# Patient Record
Sex: Male | Born: 1940 | Race: White | Hispanic: No | Smoking: Never smoker
Health system: Southern US, Community
[De-identification: ages and names within clinical notes are randomized; demographics above are authoritative.]

## PROBLEM LIST (undated history)

## (undated) ENCOUNTER — Encounter

## (undated) ENCOUNTER — Encounter: Attending: Hematology | Primary: Hematology

## (undated) ENCOUNTER — Encounter: Attending: Family | Primary: Family

## (undated) ENCOUNTER — Ambulatory Visit

## (undated) ENCOUNTER — Ambulatory Visit: Payer: MEDICARE | Attending: Physical Medicine & Rehabilitation | Primary: Physical Medicine & Rehabilitation

## (undated) ENCOUNTER — Telehealth

## (undated) ENCOUNTER — Ambulatory Visit: Payer: MEDICARE

## (undated) ENCOUNTER — Telehealth: Attending: Hematology | Primary: Hematology

## (undated) ENCOUNTER — Ambulatory Visit: Payer: MEDICARE | Attending: Hematology | Primary: Hematology

## (undated) ENCOUNTER — Encounter: Attending: Pharmacist | Primary: Pharmacist

## (undated) ENCOUNTER — Ambulatory Visit: Attending: Hematology & Oncology | Primary: Hematology & Oncology

## (undated) ENCOUNTER — Telehealth: Attending: Family | Primary: Family

## (undated) DIAGNOSIS — I10 Essential (primary) hypertension: Secondary | ICD-10-CM

## (undated) DIAGNOSIS — I639 Cerebral infarction, unspecified: Secondary | ICD-10-CM

## (undated) DIAGNOSIS — D72829 Elevated white blood cell count, unspecified: Secondary | ICD-10-CM

## (undated) DIAGNOSIS — M659 Unspecified synovitis and tenosynovitis, unspecified site: Secondary | ICD-10-CM

## (undated) DIAGNOSIS — G8929 Other chronic pain: Secondary | ICD-10-CM

## (undated) DIAGNOSIS — I509 Heart failure, unspecified: Secondary | ICD-10-CM

## (undated) DIAGNOSIS — S43439A Superior glenoid labrum lesion of unspecified shoulder, initial encounter: Secondary | ICD-10-CM

## (undated) DIAGNOSIS — G4733 Obstructive sleep apnea (adult) (pediatric): Secondary | ICD-10-CM

## (undated) DIAGNOSIS — R42 Dizziness and giddiness: Secondary | ICD-10-CM

## (undated) DIAGNOSIS — C8510 Unspecified B-cell lymphoma, unspecified site: Secondary | ICD-10-CM

## (undated) DIAGNOSIS — K219 Gastro-esophageal reflux disease without esophagitis: Secondary | ICD-10-CM

## (undated) DIAGNOSIS — H3530 Unspecified macular degeneration: Secondary | ICD-10-CM

## (undated) DIAGNOSIS — M549 Dorsalgia, unspecified: Secondary | ICD-10-CM

## (undated) DIAGNOSIS — C760 Malignant neoplasm of head, face and neck: Secondary | ICD-10-CM

## (undated) DIAGNOSIS — D126 Benign neoplasm of colon, unspecified: Secondary | ICD-10-CM

## (undated) DIAGNOSIS — I2510 Atherosclerotic heart disease of native coronary artery without angina pectoris: Secondary | ICD-10-CM

## (undated) DIAGNOSIS — E559 Vitamin D deficiency, unspecified: Secondary | ICD-10-CM

## (undated) DIAGNOSIS — Z951 Presence of aortocoronary bypass graft: Secondary | ICD-10-CM

## (undated) DIAGNOSIS — I499 Cardiac arrhythmia, unspecified: Secondary | ICD-10-CM

## (undated) DIAGNOSIS — I25810 Atherosclerosis of coronary artery bypass graft(s) without angina pectoris: Secondary | ICD-10-CM

## (undated) DIAGNOSIS — C61 Malignant neoplasm of prostate: Secondary | ICD-10-CM

## (undated) DIAGNOSIS — E785 Hyperlipidemia, unspecified: Secondary | ICD-10-CM

## (undated) HISTORY — DX: Atherosclerosis of coronary artery bypass graft(s) without angina pectoris: I25.810

## (undated) HISTORY — PX: TONSILLECTOMY AND ADENOIDECTOMY: SUR1326

## (undated) HISTORY — DX: Cerebral infarction, unspecified: I63.9

## (undated) HISTORY — DX: Unspecified macular degeneration: H35.30

## (undated) HISTORY — DX: Superior glenoid labrum lesion of unspecified shoulder, initial encounter: S43.439A

## (undated) HISTORY — DX: Dorsalgia, unspecified: M54.9

## (undated) HISTORY — DX: Dizziness and giddiness: R42

## (undated) HISTORY — DX: Presence of aortocoronary bypass graft: Z95.1

## (undated) HISTORY — PX: UPPER GASTROINTESTINAL ENDOSCOPY: SHX188

## (undated) HISTORY — PX: BIOPSY THYROID: PRO38

## (undated) HISTORY — DX: Gastro-esophageal reflux disease without esophagitis: K21.9

## (undated) HISTORY — DX: Essential (primary) hypertension: I10

## (undated) HISTORY — PX: TOTAL KNEE ARTHROPLASTY: SHX125

## (undated) HISTORY — PX: JOINT REPLACEMENT: SHX530

## (undated) HISTORY — DX: Hyperlipidemia, unspecified: E78.5

## (undated) HISTORY — DX: Benign neoplasm of colon, unspecified: D12.6

## (undated) HISTORY — PX: COLONOSCOPY: SHX174

## (undated) HISTORY — DX: Vitamin D deficiency, unspecified: E55.9

## (undated) HISTORY — DX: Unspecified B-cell lymphoma, unspecified site: C85.10

## (undated) HISTORY — DX: Obstructive sleep apnea (adult) (pediatric): G47.33

## (undated) HISTORY — DX: Atherosclerotic heart disease of native coronary artery without angina pectoris: I25.10

## (undated) HISTORY — PX: EYE SURGERY: SHX253

## (undated) HISTORY — PX: CATARACT EXTRACTION, BILATERAL: SHX1313

## (undated) HISTORY — DX: Other chronic pain: G89.29

## (undated) HISTORY — DX: Unspecified synovitis and tenosynovitis, unspecified site: M65.90

## (undated) HISTORY — DX: Synovitis and tenosynovitis, unspecified: M65.9

## (undated) HISTORY — PX: CARPAL TUNNEL RELEASE: SHX101

## (undated) HISTORY — DX: Elevated white blood cell count, unspecified: D72.829

---

## 1970-06-23 HISTORY — PX: SOFT TISSUE TUMOR RESECTION: SHX1054

## 2000-06-23 HISTORY — PX: HEMORRHOID SURGERY: SHX153

## 2000-12-17 ENCOUNTER — Encounter: Payer: Self-pay | Admitting: General Surgery

## 2000-12-18 ENCOUNTER — Inpatient Hospital Stay (HOSPITAL_COMMUNITY): Admission: RE | Admit: 2000-12-18 | Discharge: 2000-12-19 | Payer: Self-pay | Admitting: General Surgery

## 2003-06-24 DIAGNOSIS — Z951 Presence of aortocoronary bypass graft: Secondary | ICD-10-CM

## 2003-06-24 HISTORY — DX: Presence of aortocoronary bypass graft: Z95.1

## 2003-12-18 ENCOUNTER — Other Ambulatory Visit: Payer: Self-pay

## 2004-02-01 ENCOUNTER — Encounter: Payer: Self-pay | Admitting: Cardiovascular Disease

## 2004-02-01 ENCOUNTER — Inpatient Hospital Stay (HOSPITAL_COMMUNITY): Admission: EM | Admit: 2004-02-01 | Discharge: 2004-02-07 | Payer: Self-pay | Admitting: Podiatry

## 2004-02-01 HISTORY — PX: CARDIAC CATHETERIZATION: SHX172

## 2004-02-02 HISTORY — PX: CORONARY ARTERY BYPASS GRAFT: SHX141

## 2004-02-19 ENCOUNTER — Encounter
Admission: RE | Admit: 2004-02-19 | Discharge: 2004-02-19 | Payer: Self-pay | Admitting: Thoracic Surgery (Cardiothoracic Vascular Surgery)

## 2004-03-06 ENCOUNTER — Inpatient Hospital Stay (HOSPITAL_COMMUNITY): Admission: EM | Admit: 2004-03-06 | Discharge: 2004-03-08 | Payer: Self-pay | Admitting: Emergency Medicine

## 2006-05-24 ENCOUNTER — Encounter: Admission: RE | Admit: 2006-05-24 | Discharge: 2006-05-24 | Payer: Self-pay | Admitting: Internal Medicine

## 2008-08-31 ENCOUNTER — Encounter: Payer: Self-pay | Admitting: Cardiovascular Disease

## 2008-11-08 ENCOUNTER — Ambulatory Visit: Payer: Self-pay | Admitting: Internal Medicine

## 2008-12-11 ENCOUNTER — Encounter: Payer: Self-pay | Admitting: Cardiovascular Disease

## 2009-06-23 DIAGNOSIS — I639 Cerebral infarction, unspecified: Secondary | ICD-10-CM

## 2009-06-23 HISTORY — DX: Cerebral infarction, unspecified: I63.9

## 2009-10-04 DIAGNOSIS — K219 Gastro-esophageal reflux disease without esophagitis: Secondary | ICD-10-CM | POA: Insufficient documentation

## 2009-10-04 DIAGNOSIS — Z951 Presence of aortocoronary bypass graft: Secondary | ICD-10-CM | POA: Insufficient documentation

## 2009-10-04 DIAGNOSIS — I10 Essential (primary) hypertension: Secondary | ICD-10-CM | POA: Insufficient documentation

## 2009-10-04 DIAGNOSIS — Z9889 Other specified postprocedural states: Secondary | ICD-10-CM | POA: Insufficient documentation

## 2009-10-04 DIAGNOSIS — R972 Elevated prostate specific antigen [PSA]: Secondary | ICD-10-CM | POA: Insufficient documentation

## 2009-10-04 DIAGNOSIS — Z85830 Personal history of malignant neoplasm of bone: Secondary | ICD-10-CM | POA: Insufficient documentation

## 2009-10-04 DIAGNOSIS — E785 Hyperlipidemia, unspecified: Secondary | ICD-10-CM | POA: Insufficient documentation

## 2009-10-04 DIAGNOSIS — N12 Tubulo-interstitial nephritis, not specified as acute or chronic: Secondary | ICD-10-CM | POA: Insufficient documentation

## 2009-10-15 ENCOUNTER — Telehealth: Payer: Self-pay | Admitting: Cardiovascular Disease

## 2009-10-16 ENCOUNTER — Ambulatory Visit: Payer: Self-pay | Admitting: Cardiovascular Disease

## 2009-10-16 ENCOUNTER — Inpatient Hospital Stay (HOSPITAL_COMMUNITY): Admission: EM | Admit: 2009-10-16 | Discharge: 2009-10-18 | Payer: Self-pay | Admitting: Emergency Medicine

## 2009-10-17 ENCOUNTER — Encounter (INDEPENDENT_AMBULATORY_CARE_PROVIDER_SITE_OTHER): Payer: Self-pay | Admitting: Emergency Medicine

## 2009-10-17 ENCOUNTER — Ambulatory Visit: Payer: Self-pay | Admitting: Vascular Surgery

## 2009-12-13 ENCOUNTER — Encounter: Payer: Self-pay | Admitting: Cardiovascular Disease

## 2009-12-19 ENCOUNTER — Encounter: Payer: Self-pay | Admitting: Cardiovascular Disease

## 2010-02-28 ENCOUNTER — Encounter: Payer: Self-pay | Admitting: Cardiovascular Disease

## 2010-03-21 ENCOUNTER — Ambulatory Visit: Payer: Self-pay | Admitting: Podiatry

## 2010-03-27 ENCOUNTER — Ambulatory Visit: Payer: Self-pay | Admitting: Cardiovascular Disease

## 2010-03-27 DIAGNOSIS — G459 Transient cerebral ischemic attack, unspecified: Secondary | ICD-10-CM | POA: Insufficient documentation

## 2010-05-10 ENCOUNTER — Ambulatory Visit: Payer: Self-pay | Admitting: Ophthalmology

## 2010-05-20 ENCOUNTER — Ambulatory Visit: Payer: Self-pay | Admitting: Ophthalmology

## 2010-06-10 ENCOUNTER — Ambulatory Visit: Payer: Self-pay | Admitting: Ophthalmology

## 2010-06-20 ENCOUNTER — Encounter: Payer: Self-pay | Admitting: Cardiovascular Disease

## 2010-07-23 NOTE — Assessment & Plan Note (Signed)
Summary: 6 month   Visit Type:  Initial Consult Primary Provider:  Irving [name].  CC:  Had a spell back in April with slurred speech and blurred vision and went to [facility] for evaluation. He had no episodes since then..  History of Present Illness: Wesley [name] is a very pleasant 70 year old [name] who is a very active lawyer in town, history of coronary artery disease, bypass x4 in August 2005, history of fall and fracture of his left shoulder and with chronic pain, hyperlipidemia who presents to establish care.  overall, he is doing well. He did have an episode in April of this year where he developed 8 minutes of a fascia, vision problems and felt he was in a daze. This returned to normal after 8-10 minutes. He was evaluated in the emergency room at [facility] and had echocardiogram, MRI, carotid ultrasound. 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 Since then he has had no further episodes and has felt well. He does not exercise on a regular basis. He has been tolerating Crestor though he does have some muscle aches at times.  Echocardiogram from March 2010 [name] ejection fraction 50-55%, diastolic dysfunction, right ventricular systolic pressure 30-40 mm of mercury, borderline dilated left atrium.  Stress test in March 2010 [name] ejection fraction 49%, no EKG changes, exercised for 7 minutes, mild inferior ischemia versus diaphragmatic attenuation.  Cardiac catheterization in August 2005 showing 60% left main disease at the proximal region, 95% ostial LAD disease, diffuse 50-60% disease through the mid LAD and 90% mid LAD disease, 30% diagonal disease, left circumflex with 40-50% mid disease, 50% OM disease at the proximal region of OM 3, 50% distal RCA disease #5% ostial PLB disease with sequential lesion of 40-50%.  Cholesterol in June 2010 [name] total crest for 129, LDL 67, HDL 43  EKG [name] normal  sinus rhythm with rate 70 beats per minute, no significant ST or T wave changes  Preventive Screening-Counseling & Management  Alcohol-Tobacco     Smoking Status: never  Current Medications (verified): 1)  Finasteride 5 Mg Tabs (Finasteride) .Marland Kitchen.. 1 Tab Daily 2)  Coenzyme Q10 100 Mg Caps (Coenzyme Q10) .Marland Kitchen.. 1 Tab Daily 3)  Crestor 20 Mg Tabs (Rosuvastatin Calcium) .... Take One Tablet By Mouth Daily. 4)  Saw Palmetto 450 Mg Caps (Saw Palmetto (Serenoa Repens)) .Marland Kitchen.. 1 Tab Daily 5)  Vitamin B-12 250 Mcg Tabs (Cyanocobalamin) .Marland Kitchen.. 1 Tab Daily 6)  Glucosamine Chondroitin Complx  Tabs (Glucosamine-Chondroit-Vit C-Mn) .Marland Kitchen.. 1 Tab Daily 7)  Plavix 75 Mg Tabs (Clopidogrel Bisulfate) .... One Tablet Once Daily 8)  Metoprolol Succinate 50 Mg Xr24h-Tab (Metoprolol Succinate) .... One Tablet Once Daily 9)  Omeprazole 20 Mg Cpdr (Omeprazole) .... One Tablet Once Daily 10)  Tramadol-Acetaminophen 37.5-325 Mg Tabs (Tramadol-Acetaminophen) .... One Tablet 1-2 Daily 11)  Zinc 50 Mg Tabs (Zinc) .... One Tablet Once Daily 12)  Aspir-Low 81 Mg Tbec (Aspirin) .... One Tablet Once Daily 13)  Vitamin C 2000 Mg .... One Tablet Once Daily 14)  Centrum Silver  Tabs (Multiple Vitamins-Minerals) .... One Tablet Once Daily 15)  Magnesium Oxide 400 Mg Tabs (Magnesium Oxide) .... One Tablet Once Daily 16)  Calcium-Vitamin D 250-125 Mg-Unit Tabs (Calcium Carbonate-Vitamin D) .... One Tablet Once Daily 17)  Hyaluronig Acid 20 Mg .... One Tablet Once Daily 18)  Omega 3-6-9 400mg  Each 19)  Lecithin Concentrate 400 Mg  Allergies (verified): No Known Drug Allergies  Past  History:  Past Medical History: Last updated: 10/04/2009  1.  Severe three-vessel and left main coronary artery disease, status post      CABG on February 02, 2004.  Ejection fraction by catheterization was 45-      50%.  2.  Gastroesophageal reflux disease.  3.  Hypertension.  4.  Hyperlipidemia.  5.  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  6.  History of abnormal PSA.  7.  History of hemorrhoidectomy.  8.  Pyelonephritis.  Past Surgical History: Last updated: 10/04/2009 CABG 2005 hemorrhoidectomy jaw resection  Social History: Tobacco Use - No.  Full Time--Lawyer Single  Alcohol Use - yes Smoking Status:  never  Review of Systems  The patient denies fever, weight loss, weight gain, vision loss, decreased hearing, hoarseness, chest pain, syncope, dyspnea on exertion, peripheral edema, prolonged cough, abdominal pain, incontinence, muscle weakness, depression, and enlarged lymph nodes.    Vital Signs:  Patient profile:   70 year old male Height:      69 inches Weight:      217 pounds BMI:     32.16 Pulse rate:   78 / minute BP sitting:   139 / 84  (left arm) Cuff size:   large  Vitals Entered By: Wesley [name], CMA (March 27, 2010 2:38 PM)  Physical Exam  General:  Well developed, well nourished, in no acute distress. Head:  normocephalic and atraumatic Neck:  Neck supple, no JVD. No masses, thyromegaly or abnormal cervical nodes. Lungs:  Clear bilaterally to auscultation and percussion. Heart:  Non-displaced PMI, chest non-tender; regular rate and rhythm, S1, S2 without murmurs, rubs or gallops. Carotid upstroke normal, no bruit. Pedals normal pulses. No edema, no varicosities. Abdomen:  Bowel sounds positive; abdomen soft and non-tender without masses,obese Msk:  Back normal, normal gait. Muscle strength and tone normal. Pulses:  pulses normal in all 4 extremities Extremities:  No clubbing or cyanosis. Neurologic:  Alert and oriented x 3. Skin:  Intact without lesions or rashes. Psych:  Normal affect.   Impression & Recommendations:  Problem # 1:  CORONARY ATHEROSLERO AUTOL VEIN BYPASS GRAFT (ICD-414.02) no symptoms of angina. No significant ischemia on stress test from March 2010. No stress test ordered at this time. We have encouraged him to increase his exercise. We did suggest  that he increase his aspirin to 81 mg x2 with his Plavix.  His updated medication list for this problem includes:    Plavix 75 Mg Tabs (Clopidogrel bisulfate) ..... One tablet once daily    Metoprolol Succinate 50 Mg Xr24h-tab (Metoprolol succinate) ..... One tablet once daily    Aspir-low 81 Mg Tbec (Aspirin) ..... One tablet once daily  Problem # 2:  HYPERLIPIDEMIA (ICD-272.4) Cholesterol is well controlled last year. We have suggested that he have his cholesterol checked this year His updated medication list for this problem includes:    Crestor 20 Mg Tabs (Rosuvastatin calcium) .Marland Kitchen... Take one tablet by mouth daily.  Problem # 3:  HYPERTENSION (ICD-401.9) blood pressure is well controlled on his current medication regimen. His blood pressure is well controlled at home. We will not add an ACE inhibitor at this time though could in the future.  His updated medication list for this problem includes:    Metoprolol Succinate 50 Mg Xr24h-tab (Metoprolol succinate) ..... One tablet once daily    Aspir-low 81 Mg Tbec (Aspirin) ..... One tablet once daily  Problem # 4:  TIA (ICD-435.9) he did have  a TIA in April of this year. No further episodes. If he has additional episodes, this may warrant additional workup such as an echo with bubble study. Given he has a stroke on MRI, recent TIA, additional episodes may also warrant a change to warfarin or pradaxa.  Patient Instructions: 1)  Your physician has recommended you make the following change in your medication: START CRESTOR  2)  Your physician wants you to follow-up in:   12 MONTHS You will receive a reminder letter in the mail two months in advance. If you don't receive a letter, please call our office to schedule the follow-up appointment. Prescriptions: CRESTOR 20 MG TABS (ROSUVASTATIN CALCIUM) Take one tablet by mouth daily.  #30 x 12   Entered by:   Benedict Needy, RN   Authorized by:   Dossie Arbour MD   Signed by:   Benedict Needy,  RN on 03/27/2010   Method used:   [facility] to        [facility] [phone]* (retail)       [address]       [place], [place]  [address]       Ph: [phone]       Fax: [phone]   RxID:   [id number]

## 2010-07-23 NOTE — Letter (Signed)
Summary: Historic Patient File  Historic Patient File   Imported By: [place] Carbo 12/19/2009 10:33:05  _____________________________________________________________________  External Attachment:    Type:   Image     Comment:   External Document

## 2010-07-23 NOTE — Progress Notes (Signed)
Summary: STROKE???  Phone Note Call from Patient Call back at [phone]   Caller: SECRETARY Call For: [facility] Summary of Call: PT IS A SOUTHEASTERN PT-THINKS HE MAY HAVE HAD A STROKE YESTERDAY WHILE EATING LUNCH-THINKS THAT HE SHOULD BE SEEN-FEELS THAT IT IS EMERGENT TO BE SEEN-SECRETARY FEELS THAT THE APPOINTMENT THAT I SCHEDULED WITH [facility] ON THURSDAY IS UNSATISFACTORY AND FEELS THAT HE SHOULD BE SEEN SOONER-FACE FELT NUMB-HAD SLURRED SPEECH-TOOK 325 MG ASPIRIN WITHIN 5 MINUTES OF THE SYMPTOMS-LYED TO DOWN FOR AN HOUR-HAD A MIGRAINE-HEADACHE IS THE ONLY THING THAT HAS REMAINED SYMPTOM WISE-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  symptoms resolved after a short period of time so he did not think it was necessary to seek medical attention.  instructed pt that he should contact his pcp at this point for further evaluation- appt Thurs with Dr. Shirlee Latch is sufficient from a cardiology standpoint.  pt aware.  Follow-up by: Charlena Cross, RN, BSN,  October 15, 2009 12:50 PM

## 2010-07-23 NOTE — Miscellaneous (Signed)
Summary: REFILL PLAVIX  Clinical Lists Changes  Medications: Added new medication of PLAVIX 75 MG TABS (CLOPIDOGREL BISULFATE) ONE TABLET once daily - Signed Rx of PLAVIX 75 MG TABS (CLOPIDOGREL BISULFATE) ONE TABLET once daily;  #30 x 3;  Signed;  Entered by: Bishop Dublin, CMA;  Authorized by: Dossie Arbour MD;  Method used: [facility] [id number] [address], [place], [place], [place]  [address], Ph: [phone], Fax: [phone]    Prescriptions: PLAVIX 75 MG TABS (CLOPIDOGREL BISULFATE) ONE TABLET once daily  #30 x 3   Entered by:   Bishop Dublin, CMA   Authorized by:   Dossie Arbour MD   Signed by:   Bishop Dublin, CMA on 02/28/2010   Method used:   Electronically to        [facility] [phone]* (retail)       [address]       [place], [place]  [address]       Ph: [phone]       Fax: [phone]   RxID:   [id number]

## 2010-07-23 NOTE — Letter (Signed)
Summary: [facility]   Imported By: Marylou Mccoy 04/26/2010 11:58:58  _____________________________________________________________________  External Attachment:    Type:   Image     Comment:   External Document

## 2010-07-25 NOTE — Letter (Signed)
Summary: [facility]   Imported By: Marylou Mccoy 07/04/2010 17:35:23  _____________________________________________________________________  External Attachment:    Type:   Image     Comment:   External Document

## 2010-08-20 ENCOUNTER — Encounter: Payer: Self-pay | Admitting: Cardiovascular Disease

## 2010-09-10 LAB — HEMOGLOBIN A1C: Mean Plasma Glucose: 108 mg/dL (ref <117)

## 2010-09-10 LAB — DIFFERENTIAL
Eosinophils Relative: 4 % (ref 0–5)
Lymphs Abs: 2.6 10*3/uL (ref 0.7–4.0)

## 2010-09-10 LAB — COMPREHENSIVE METABOLIC PANEL
ALT: 40 U/L (ref 0–53)
AST: 41 U/L — ABNORMAL HIGH (ref 0–37)
Alkaline Phosphatase: 43 U/L (ref 39–117)
BUN: 19 mg/dL (ref 6–23)
CO2: 27 mEq/L (ref 19–32)
Calcium: 9.6 mg/dL (ref 8.4–10.5)
Creatinine, Ser: 0.9 mg/dL (ref 0.4–1.5)
GFR calc Af Amer: >60 mL/min (ref >60)
Glucose, Bld: 92 mg/dL (ref 70–99)

## 2010-09-10 LAB — LIPID PANEL
Total CHOL/HDL Ratio: 2.9 RATIO
VLDL: 20 mg/dL (ref 0–40)

## 2010-09-10 LAB — CBC
HCT: 44.5 % (ref 39.0–52.0)
HCT: 44.7 % (ref 39.0–52.0)
Hemoglobin: 15.3 g/dL (ref 13.0–17.0)
Hemoglobin: 15.4 g/dL (ref 13.0–17.0)
MCHC: 34.3 g/dL (ref 30.0–36.0)
MCV: 91.1 fL (ref 78.0–100.0)
Platelets: 227 10*3/uL (ref 150–400)
RBC: 4.9 MIL/uL (ref 4.22–5.81)
RBC: 4.9 MIL/uL (ref 4.22–5.81)
RDW: 13.2 % (ref 11.5–15.5)
WBC: 9.1 10*3/uL (ref 4.0–10.5)

## 2010-09-10 LAB — APTT: aPTT: 29 seconds (ref 24–37)

## 2010-09-10 LAB — BASIC METABOLIC PANEL
BUN: 23 mg/dL (ref 6–23)
Calcium: 9.6 mg/dL (ref 8.4–10.5)
Creatinine, Ser: 0.97 mg/dL (ref 0.4–1.5)
GFR calc non Af Amer: >60 mL/min (ref >60)
Glucose, Bld: 96 mg/dL (ref 70–99)
Potassium: 4.2 mEq/L (ref 3.5–5.1)
Potassium: 4.4 mEq/L (ref 3.5–5.1)
Sodium: 136 mEq/L (ref 135–145)

## 2010-09-10 LAB — VITAMIN B12: Vitamin B-12: 896 pg/mL (ref 211–911)

## 2010-09-10 LAB — HOMOCYSTEINE: Homocysteine: 8.2 umol/L (ref 4.0–15.4)

## 2010-09-10 LAB — ANA: Anti Nuclear Antibody(ANA): NEGATIVE

## 2010-10-14 ENCOUNTER — Institutional Professional Consult (permissible substitution): Payer: Self-pay | Admitting: Cardiovascular Disease

## 2010-10-15 ENCOUNTER — Telehealth: Payer: Self-pay | Admitting: Cardiovascular Disease

## 2010-10-15 NOTE — Telephone Encounter (Signed)
LMOM to reschedule missed appt from 10/14/10.

## 2010-10-24 ENCOUNTER — Ambulatory Visit (INDEPENDENT_AMBULATORY_CARE_PROVIDER_SITE_OTHER): Payer: Medicare Other | Admitting: Cardiovascular Disease

## 2010-10-24 ENCOUNTER — Encounter: Payer: Self-pay | Admitting: Cardiovascular Disease

## 2010-10-24 DIAGNOSIS — I10 Essential (primary) hypertension: Secondary | ICD-10-CM

## 2010-10-24 DIAGNOSIS — G459 Transient cerebral ischemic attack, unspecified: Secondary | ICD-10-CM

## 2010-10-24 DIAGNOSIS — E785 Hyperlipidemia, unspecified: Secondary | ICD-10-CM

## 2010-10-24 DIAGNOSIS — I25810 Atherosclerosis of coronary artery bypass graft(s) without angina pectoris: Secondary | ICD-10-CM

## 2010-10-24 NOTE — Patient Instructions (Signed)
You are doing well. No medication changes were made. Please call us if you have new issues that need to be addressed before your next appt.  We will call you for a follow up Appt. In 12 months  

## 2010-10-24 NOTE — Assessment & Plan Note (Signed)
Blood pressure is well controlled on today's visit. No changes made to the medications. 

## 2010-10-24 NOTE — Assessment & Plan Note (Signed)
Previous workup had suggested an old stroke in the cerebellum appeared he is on aspirin and Plavix with no further symptoms.

## 2010-10-24 NOTE — Progress Notes (Signed)
   Patient ID: Wesley [name], male    DOB: 09/17/1940, 70 y.o.   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 He does not exercise. He continues to work long hours by choice. He denies any chest pain, shortness of breath. Overall he has no new complaints. He was unable to manage the cost of Crestor and change to Lipitor 40 mg daily. This was recently increased to 80 mg as his cholesterol was not well controlled. He has not started 80 mg yet.   mild atherosclerosis of the carotids though no significant stenoses.  Echocardiogram from March 2010 shows ejection fraction 50-55%, diastolic dysfunction, right ventricular systolic pressure 30-40 mm of mercury, borderline dilated left atrium. Stress test in March 2010 shows ejection fraction 49%, no EKG changes, exercised for 7 minutes, mild inferior ischemia versus diaphragmatic attenuation.   Cardiac catheterization in August 2005 showing 60% left main disease at the proximal region, 95% ostial LAD disease, diffuse 50-60% disease through the mid LAD and 90% mid LAD disease, 30% diagonal disease, left circumflex with 40-50% mid disease, 50% OM disease at the proximal region of OM 3, 50% distal RCA disease #5% ostial PLB disease with sequential lesion of 40-50%.   EKG shows normal sinus rhythm with rate 73 beats per minute, no significant ST or T wave changes      Review of Systems  Constitutional: Negative.   HENT: Negative.   Eyes: Negative.   Respiratory: Negative.   Cardiovascular: Negative.   Gastrointestinal: Negative.   Musculoskeletal: Negative.   Skin: Negative.   Neurological: Negative.   Hematological: Negative.     Psychiatric/Behavioral: Negative.   All other systems reviewed and are negative.   BP 120/88  Pulse 70  Ht 5\' 9"  (1.753 m)  Wt 210 lb (95.255 kg)  BMI 31.01 kg/m2    Physical Exam  Nursing note and vitals reviewed. Constitutional: He is oriented to person, place, and time. He appears well-developed and well-nourished.  HENT:  Head: Normocephalic.  Nose: Nose normal.  Mouth/Throat: Oropharynx is clear and moist.  Eyes: Conjunctivae are normal. Pupils are equal, round, and reactive to light.  Neck: Normal range of motion. Neck supple. No JVD present.  Cardiovascular: Normal rate, regular rhythm, S1 normal, S2 normal, normal heart sounds and intact distal pulses.  Exam reveals no gallop and no friction rub.   No murmur heard. Pulmonary/Chest: Effort normal and breath sounds normal. No respiratory distress. He has no wheezes. He has no rales. He exhibits no tenderness.  Abdominal: Soft. Bowel sounds are normal. He exhibits no distension. There is no tenderness.  Musculoskeletal: Normal range of motion. He exhibits no edema and no tenderness.  Lymphadenopathy:    He has no cervical adenopathy.  Neurological: He is alert and oriented to person, place, and time. Coordination normal.  Skin: Skin is warm and dry. No rash noted. No erythema.  Psychiatric: He has a normal mood and affect. His behavior is normal. Judgment and thought content normal.           Assessment and Plan

## 2010-10-24 NOTE — Assessment & Plan Note (Signed)
Currently with no symptoms of angina. No further workup at this time. Continue current medication regimen. 

## 2010-10-24 NOTE — Assessment & Plan Note (Signed)
We have suggested he increase the Lipitor to 80 mg daily with a check of his cholesterol in 3-6 months time. If his cholesterol is still not well controlled, we could change to Crestor or add zetia 10 mg daily

## 2010-11-08 NOTE — Discharge Summary (Signed)
NAME:  Wesley [name], Wesley [name]                ACCOUNT NO.:  [id number]   MEDICAL RECORD NO.:  [id number]                   PATIENT TYPE:  INP   LOCATION:  3709                                 FACILITY:  [facility]   PHYSICIAN:  Jackie Plum, M.D.             DATE OF BIRTH:  [birth year]   DATE OF ADMISSION:  03/05/2004  DATE OF DISCHARGE:  03/08/2004                                 DISCHARGE SUMMARY   DISCHARGE DIAGNOSES:  1.  Pyelonephritis.  2.  History of coronary artery disease.  3.  Gastroesophageal reflux disease.  4.  Dyslipidemia.  5.  Hypertension.  6.  Increased PSA.   DISCHARGE MEDICATIONS:  1.  Patient is going to resume all his pre admission medications as      previously.  2.  New medicine will be ciprofloxacin 500 mg p.o. b.i.d. for 10 days.   DISCHARGE LABORATORIES:  WBC 5.8, hemoglobin 11.9, hematocrit 35.9, MCV  83.8, platelet count 306.  Sodium 136, potassium 3.6, chloride 106, CO2 of  24, glucose 93, BUN 7, creatinine 0.8.   ACTIVITY:  As tolerated.   DIET:  Cardiac diet.   He is to report to his doctor if he experiences any problems including  fever, chills, nausea, or vomiting.  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  Two urine cultures done on  March 05, 2004 grew E. coli sensitive to Cipro.  On blood cultures drawn  on admission, there had not been any growth by time of discharge.  Patient  has scheduled appointment to see his PCP two weeks from date of discharge.   REASON FOR ADMISSION:  Pyelonephritis.  Patient presented with fever, nausea  and vomiting, urinary frequency without dysuria.  According to H&P by Dr.  Lendell Caprice, on admission, his temperature was 101.3 degrees Fahrenheit.  His  blood pressure ranged from systolic of 90-110 and heart rate of 113.  Examination was notable for slight left costovertebral angle tenderness.  His laboratory work was notable for white count of 27,000 with 89%  neutrophils.  X-ray was negative for an acute infiltrate and UA was positive  for nitrites with small leukocyte esterase, many bacteria.  Therefore,  admitted for left pyelonephritis with early urosepsis.   HOSPITAL COURSE:  Admitted to hospitalists service and was started on  aggressive IV antibiotics with IV fluid supplementation and antinauseants.  Cultures were obtained as noted above.  With these measures, the patient has  been afebrile over the last 24 hours and he has been able to tolerate diet  without any problems.  His nausea and vomiting has resolved and he feels  stronger and ready for discharge today.  Patient was noted to have  incidental finding of 2.8 cm renal cyst as noted above.  He is to follow up  this  finding with his PCP at outpatient level.  I discussed this with the  patient prior to discharge.  He expressed understanding.  He knows he is  supposed to discuss this with his PCP when he sees him in about two weeks at  which point a plan of care regarding further evaluation will be instituted.   DISPOSITION:  Patient is going home.  Patient discharged home in stable,  satisfactory condition.  Was seen by cardiology today who gave him  appropriate and stable from cardiac standpoint.   DISCHARGE LABORATORIES:  WBC 5.8, hemoglobin 11.9, hematocrit 35.9, MCV  83.8, platelet count 306.  Sodium 136, potassium 3.6, chloride 106, CO2 of  24, glucose 93, BUN 7, creatinine 0.8, calcium 8.7.       GO/MEDQ  D:  03/08/2004  T:  03/10/2004  Job:  [id number]   cc:   Darlin Priestly, M.D.  [address]  [place]  [place] [address]  Fax: [phone]

## 2010-11-08 NOTE — H&P (Signed)
NAME:  Wesley [name], SALVETTI NO.:  [id number]   MEDICAL RECORD NO.:  [id number]                   PATIENT TYPE:  EMS   LOCATION:  MAJO                                 FACILITY:  [facility]   PHYSICIAN:  [name]. [name], M.D.               DATE OF BIRTH:  [birth year]   DATE OF ADMISSION:  02/01/2004  DATE OF DISCHARGE:                                HISTORY & PHYSICAL   CHIEF COMPLAINT:  Chest pain.   A 70 year old white male had a cardiac catheterization done today by Dr.  Jenne Campus.  The patient was told he needed bypass surgery and no bed available  at the hospital.  The patient was sent to the emergency department.  He was  awake, alert, and cooperative with minimal chest pain at this time.  His  cardiologist will be seeing him in the emergency department or the vascular  surgeon.  Vital signs noted to be stable.  Heart without murmurs.  Lungs  clear.  Speech clear.  Skin warm and dry.   PLAN:  Admission.  Patient to have bypass surgery.   Stable screening examination for chest pain. No beds available in the  hospital.                                                [name]. [name], M.D.    NMM/MEDQ  D:  02/01/2004  T:  02/01/2004  Job:  [id number]

## 2010-11-08 NOTE — Op Note (Signed)
NAME:  Wesley [name], Wesley [name]                   ACCOUNT NO.:  [id number]   MEDICAL RECORD NO.:  [id number]                   PATIENT TYPE:  INP   LOCATION:  2304                                 FACILITY:  [facility]   PHYSICIAN:  Salvatore Decent. Cornelius Moras, M.D.              DATE OF BIRTH:  September 02, 1940   DATE OF PROCEDURE:  02/02/2004  DATE OF DISCHARGE:                                 OPERATIVE REPORT   PREOPERATIVE DIAGNOSIS:  Severe three vessel coronary artery disease, left  main disease.   POSTOPERATIVE DIAGNOSIS:  Severe three vessel coronary artery disease, left  main disease.   PROCEDURE:  Median sternotomy for coronary artery bypass grafting x4 (left  internal mammary artery to first diagonal branch, right internal mammary  artery to distal right coronary artery, saphenous vein graft to distal left  anterior descending artery, saphenous vein graft to circumflex marginal  branch, endoscopic saphenous vein harvest from right thigh).   SURGEON:  Dr. Purcell Nails.   ASSISTANT:  Ms. Shonna Chock   ANESTHESIA:  General.   BRIEF CLINICAL NOTE:  Patient is a 70 year old gentleman from [place], [place] with no previous history of coronary artery disease.  He presents  with progressive symptoms of chest pain suspicious for angina, although some  of his symptoms are atypical in nature.  He was evaluated by Dr. Lenise Herald and subsequently brought in for elective cardiac catheterization on  February 01, 2004.  Findings at the time of catheterization are notable for  severe three vessel coronary artery disease and left main disease with  critical stenosis of the proximal left anterior descending coronary artery.  A full consultation note has been dictated previously.  Left ventricular  function is preserved.   OPERATIVE CONSENT:  The patient has been counseled at length regarding the  indications, risks, and potential benefits of coronary artery bypass  grafting.  Alternative  treatment strategies have been discussed.  He  understands and accepts all associated risks of surgery including, but not  limited to, risk of death, stroke, myocardial infarction, congestive heart  failure, respiratory failure, pneumonia, bleeding requiring blood  transfusion, arrhythmia, infection, recurrent coronary artery disease,  recurrent chest pain from other sources.  All of his questions have been  addressed.   OPERATIVE FINDINGS:  Diffuse coronary artery disease with visible and  palpable plaque throughout all of the epicardial coronary arteries.  The  distal left anterior descending coronary artery was quite small and  diffusely diseased.  The diagonal branch off the left anterior descending  coronary artery was much larger and felt to be a much more important vessel.  Left ventricular function is preserved.   OPERATIVE NOTE IN DETAIL:  The patient was brought to the operating room on  the above-mentioned date and central monitoring was established by the  anesthesia service under the care and direction of Dr. Judie Petit.  Specifically, a Swan-Ganz catheter was placed through the right internal  jugular approach.  A radial arterial line was placed.  Intravenous  antibiotics were administered.  Following induction with general  endotracheal anesthesia, a Foley catheter is placed.  The patient's chest,  abdomen, both groins, and both lower extremities are prepared and draped in  a sterile manner.  A median sternotomy incision is performed and the left  internal mammary artery is dissected from the chest wall and prepared for  bypass grafting.  The left internal mammary artery is good quality conduit.  Following this, the right internal mammary artery is also dissected from the  chest wall and prepared for bypass grafting.  This vessel is also good  quality conduit.  Simultaneously, saphenous vein is obtained from the  patient's right thigh and the upper portion of the  right lower leg using  endoscopic vein harvest technique through a small incision made just above  the right knee.  The saphenous vein is notably a good quality conduit.  After the saphenous vein is removed, the small incisions in the right lower  extremity are closed in multiple layers with running absorbable suture.  The  patient is heparinized systemically.   The pericardium is open.  The ascending aorta is mildly dilated.  It is  otherwise normal and free of any palpable plaques or calcifications.  The  ascending aorta and the right atrium are cannulated for cardiopulmonary  bypass.  Adequate heparinization is verified.  Cardiopulmonary bypass is  begun and the surface of the heart is inspected.  Distal sites are selected  for coronary bypass grafting.  Portions of the saphenous vein and both  internal mammary arteries are trimmed to appropriate length.  A temperature  probe is placed in the left ventricular septum.  A cardioplegia catheter is  placed in the ascending aorta.   The patient is cooled to 30 degrees systemic temperature.  The aortic cross  clamp is applied and cardioplegia is delivered in an antegrade fashion to  the aortic root.  Iced saline flush is applied for topical hypothermia.  The  initial cardioplegic arrest and myocardial cooling are felt to be  satisfactory.  Repeat doses of cardioplegia are administered intermittently  throughout the cross clamp portion of the operation, both through the aortic  root and down the subsequently placed vein grafts to maintain septal  temperature below 15 degrees Centigrade.  The following distal coronary  anastomoses are performed:  1. The distal right coronary artery is grafted with the right internal     mammary artery in end-to-side fashion.  This anastomosis is placed     approximately 1.5 cm beyond the bifurcation of the distal right coronary     artery where the posterior descending coronary artery comes off.  This     vessel was moderately diseased at the sight of distal bypass but has a     2.0 mm lumen.  Beyond this, the distal right coronary artery gives rise     to a single posterolateral branch.  It also gives rise to a small artery     at the crux of the heart, although the main posterior descending coronary     artery comes off more proximally.  2. The circumflex marginal branch is grafted with a saphenous vein graft in     an end-to-side fashion. This coronary is diffusely diseased, but measures     1.8 mm in diameter at the site of distal bypass.  It is less good quality     target in comparison  to the distal right coronary artery, although it     does accept a 1.5 probe in both directions.  3. The distal left anterior descending coronary artery is grafted with the     saphenous vein graft in an end-to-side fashion.  This vessel measures 1.0     mm in diameter at the site of distal bypass, images are fair to poor     quality.  For this reason, left internal mammary artery is not utilized     for this target.  4. The diagonal branch off the left anterior descending coronary artery is     grafted with left internal mammary artery in an end-to-side fashion.     This coronary artery is moderately diseased but a 1.5 probe will pass     into both directions.   Both proximal saphenous vein anastomoses are performed directly to the  ascending aorta prior to removal of the aortic cross clamp.  Both internal  mammary arteries are now reperfused and the septal temperature is noted to  rise rapidly.  The heart begins to beat spontaneously.  All air is evacuated  through the aortic root.  The aortic cross clamp is removed after a total  cross clamp time of 78 minutes.   The heart resumed sinus rhythm spontaneously.  All proximal and distal  coronary anastomoses are inspected for hemostasis and appropriate graft  orientation.  Epicardial pacing wires are affixed to the right ventricular  outflow  __________into the right atrial appendage.  The patient is rewarmed  to 37 degrees Centigrade temperature.  The patient is weaned from  cardiopulmonary bypass without difficulty.  The patient's rhythm at  separation from bypass is normal sinus rhythm.  Atrial pacing is employed to  increase the heart rate.  No inotropic support is required.  Total  cardiopulmonary bypass time for the operation is 97 minutes.   The venous end arterial cannulae are both removed uneventfully.  Protamine  is administered to reverse the anticoagulation.  The mediastinum in both  left and right pleural spaces are irrigated with saline solution containing  vancomycin.  Meticulous surgical hemostasis was ascertained.  The  mediastinum and both pleural spaces are drained with four chest tubes placed  through separate stab incisions inferiorly.  The median sternotomy was  closed with double strength sternal wire.  The soft tissues anterior to the  sternum are closed in multiple layers.  The skin is closed with running subcuticular skin closure.   The patient tolerated the procedure well and is transported to the surgical  intensive care unit in stable condition.  There are no intraoperative  complications.  All sponge and instrument counts were correct at completion  of the operation.  The needle count was incorrect, as one of the very tiny  needles from 8-0 suture had been lost during the procedure.  An extensive  search was performed and it was determined this did not seem likely to have  been lost over the operative field.  X-rays performed postoperatively to  confirm the absence of any foreign body.  No blood products were  administered.   The patient is transported to surgical intensive care unit in stable  condition.  There are no intraoperative complications.  Salvatore Decent. Cornelius Moras, M.D.    CHO/MEDQ  D:  02/02/2004  T:  02/04/2004  Job:  [id number]   cc:   Darlin Priestly, M.D.  [phone] [address], [address]  [place]  [place] [address]  Fax: [phone]   Steele Sizer, M.D.  [address]  [place], [place] [address]

## 2010-11-08 NOTE — Discharge Summary (Signed)
NAME:  Wesley [name], Wesley [name]                   ACCOUNT NO.:  [id number]   MEDICAL RECORD NO.:  [id number]                   [name] TYPE:  INP   LOCATION:  2034                                 FACILITY:  [facility]   PHYSICIAN:  Salvatore Decent. Cornelius Moras, M.D.              DATE OF BIRTH:  04-Jul-1940   DATE OF ADMISSION:  02/01/2004  DATE OF DISCHARGE:  02/06/2004                                 DISCHARGE SUMMARY   PRIMARY PHYSICIAN:  Dr. Vonita Moss.   CARDIOLOGIST:  Dr. Lenise Herald.   ADMISSION DIAGNOSIS:  Severe three vessel coronary artery disease and left  main disease.   SECONDARY/DISCHARGE DIAGNOSES:  1. Severe three vessel coronary artery disease and left main disease.  2. Status post coronary artery bypass graft.  Preserved left ventricular     ejection fraction per heart catheterization estimated at 45-50%.  3. No significant carotid artery disease per preoperative carotid duplex.  4. Gastroesophageal reflux disease status post EGD and dilatation for     presumed lower esophageal stricture by Dr. Lina Sar in 2002.  5. Hypertension newly diagnosed.  6. Hypercholesterolemia recently diagnosed.  7. 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  8. History of abnormal PSA followed by [facility].  9. Status post hemorrhoidectomy in 2002.   ALLERGIES:  No known drug allergies.   PROCEDURES:  1. On 02/01/04, Wesley [name] underwent cardiac catheterization by Dr.     Jenne Campus showing severe three vessel coronary artery disease and left main     disease with mild left ventricular dysfunction with ejection fraction     estimated at 45-50%.  2. On 02/02/04, Wesley [name] underwent median sternotomy for coronary artery     bypass grafting x4 using Wesley left internal mammary artery to Wesley first     diagonal branch, right internal mammary artery to Wesley distal right     coronary artery, saphenous vein graft to Wesley  distal left anterior     descending artery, saphenous vein graft to Wesley circumflex marginal     branch, endoscopic vein harvesting from Wesley right thigh and lower leg.     Surgeon was Dr. Tressie Stalker.   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  He was in his usual state of health until approximately one year ago when he  began to suffer from intermittent episodes of substernal chest pain.  Initially these symptoms were somewhat sporadic and waxed and waned and had  somewhat atypical features.  He attributed these symptoms to reflux.  He did  note that his chest pain seemed to accelerate with associated stress.  Approximately six weeks prior to admission he had a particularly severe  episode of sharp substernal chest pain that  lasted several hours.  This was  associated with nausea, vomiting and diaphoresis.  Ultimately he presented  to [facility] emergency room in [facility] where he was  admitted for possible unstable angina.  He ruled out for an acute myocardial  infarction based on serial cardiac enzymes.  He did undergo a stress  Cardiolite examination which apparently was felt to be low risk for possible  ischemia.  Therefore symptoms were subsequently attributed to likely  gastroesophageal reflux disease.  However, since then, he has essentially  had continuous chest pain which has never completely gone away.  Wesley  severity of his pain continued to wax and wane somewhat.  He also noted  progression of exertional fatigue as well as moderate exertional shortness  of breath.  Wesley pain was often worse at night, when he lay down, and  sometimes after meals.  Ultimately, Wesley [name] presented with this set of  symptoms to Dr. Lenise Herald who saw him in consultation on 01/25/04.  He  was  subsequently set up for elective cardiac catheterization at [facility] on 02/01/04.   HOSPITAL COURSE:  On 02/01/04, Mr. Hammar was electively admitted to [facility] and did undergo cardiac catheterization by Dr. Jenne Campus.  Findings were notable for left main disease with severe three vessel  coronary artery disease and mild left ventricular dysfunction.  Based on  these findings, Dr. Tressie Stalker was consulted for possible cardiac  revascularization.  In Wesley meantime he was treated with IV heparin and IV  nitroglycerin.  Dr. Cornelius Moras saw Wesley [name] on Wesley evening of 8/11.  After  review of his cardiac catheterization report or films and examination of Wesley  [name], he did feel that Wesley [name] would benefit from coronary artery  bypass grafting surgery.  After discussing risks, benefits and alternatives  with Wesley [name] and his fiance, Wesley [name] did agree to proceed, and his  surgery was scheduled for Wesley following day.   On 02/02/04, Wesley [name] was taken to Wesley operating room and did undergo  coronary artery bypass grafting surgery as discussed above.  He tolerated  Wesley procedure relatively well and was off cardiopulmonary bypass in normal  sinus rhythm.  No blood products were required.  He was transferred to Wesley  surgical intensive care unit in stable condition.  By later that evening he  remained sedated on Wesley vent but hemodynamically stable.  His chest tube  output was low and urine output was adequate.  Postoperative labs remained  stable.   On postoperative day #1, Wesley [name] had been extubated and was  neurologically intact.  He was maintained in sinus rhythm.  Chest tube  output remained low and they were discontinued later that day without  incident.  He did show signs of mild volume excess with his weight up  approximately eight pounds from his baseline.  He was started on short-term  diuretic therapy.  On postoperative day #2, Wesley [name]  remained stable and was making good  progress.  He remained afebrile and in sinus rhythm.  His systolic blood  pressure was ranging between 110-140.  He had been weaned from supplemental  oxygen and was saturating 97%.  Chest x-ray was stable showing bibasilar  atelectasis.  Postoperatively labs remained stable other than elevated white  blood count despite remaining afebrile.  This peaked on 8/13 at 23,000, but  over Wesley next several days decreased to 14,000.  There was no obvious source  of infection.   By postoperative day #3, Wesley [name] had been transferred out of Wesley  surgical intensive care unit onto Wesley floor.  He continued to do well.  He  was tolerating oral diet.  His bowel and bladder function were working  appropriately.  He was ambulating Wesley hallways with assist per cardiac  rehabilitation.  His blood pressure remained stable.  It was felt he could  tolerate increase in his beta blocker to Lopressor 25 mg twice a day.  On  examination his heart had a regular rate and rhythm.  His lungs were  relatively clear.  His incisions were healing without signs of infection.  His abdominal examination was soft.  As mentioned before, his white blood  count was trending down.  His chest x-ray showed improved aeration with  decreasing bibasilar atelectasis.  He was also showing good diuresis.  It  was felt that if he continued to progress in this manner that he would be  stable for discharge in Wesley next 24-48 hours.  His anticipated date of  discharge is 02/06/04.   LABORATORY DATA:  Most recent labs show a sodium of 138, potassium 4.1,  blood glucose 103, BUN 20, creatinine 1.0, white blood count 14.9,  hemoglobin 10.2, hematocrit 29.8, platelet count 231.   DISCHARGE MEDICATIONS:  1. Enteric-coated aspirin 81 mg, 1 p.o. every day.  2. Lopressor 25 mg, 1 p.o. b.i.d.  3. Altace 2.5 mg, 1 p.o. every day.  4. Lipitor 40 mg, 1 p.o. every day.  5. Nexium 40 mg, 1 p.o. every day.  6.  Plavix 75 mg, 1 p.o. every day (per Dr. Jacinto Halim).  7. Carafate 1 g q.i.d. per home regimen.  8. Tylox 1-2 tablets p.o. q.4-6h. p.r.n. pain.   DISCHARGE INSTRUCTIONS:  He is to avoid driving or heavy lifting more than  10 pounds.  He is encouraged to continue daily walking and breathing  exercises.  He is to follow a low-fat, low-salt diet.  He may shower daily  with mild soak and water.  He should notify Wesley [facility] office if he develops  fever greater than 101 or redness or drainage from his incision sites.   FOLLOWUP:  1. He is to call [phone] to schedule a two-week followup with Dr. Jenne Campus.  2. He is to follow up with Dr. Cornelius Moras at [facility] office on 02/19/04 at 12:30     p.m.  He is to have a chest x-ray one hour before at [facility].  He was instructed to bring his chest x-ray films with     him to [facility] C[facility] office.      Jerold Coombe, P.A.                  Salvatore Decent. Cornelius Moras, M.D.   AWZ/MEDQ  D:  02/05/2004  T:  02/05/2004  Job:  [id number]   cc:   Darlin Priestly, M.D.  [phone] [address]  [place]  [place] [address]  Fax: [phone]   Cristy Hilts. Jacinto Halim, M.D.  [address]  [place]  [place] [address]  Fax: [phone]   Vonita Moss, M.D.

## 2010-11-08 NOTE — Discharge Summary (Signed)
NAME:  Wesley [name], Wesley [name]                   ACCOUNT NO.:  [id number]   MEDICAL RECORD NO.:  [id number]                   PATIENT TYPE:  INP   LOCATION:  2034                                 FACILITY:  [facility]   PHYSICIAN:  Salvatore Decent. Cornelius Moras, M.D.              DATE OF BIRTH:  25-Nov-1940   DATE OF ADMISSION:  02/01/2004  DATE OF DISCHARGE:  02/07/2004                                 DISCHARGE SUMMARY   ADDENDUM:  Addendum to previously dictated discharge summary; that job  [phone]   Initially, it was anticipated that Mr. Wesley [name] would be ready for  discharge on February 06, 2004.  However, his oxygen saturations were noted to  be in the mid to high 80 percentile on room air.  He also desaturated to the  low 80s on room air with ambulation.  For this reason, he was placed on  oxygen per nasal cannula at 2-3 L.  For this reason it was felt he should  remain hospitalized for an additional day.  In the meantime, pulmonary  toilet was encouraged with regular use of his incentive spirometer.  A chest  x-ray was also ordered to follow up his bilateral atelectasis and pleural  effusions.   On February 07, 2004 Wesley [name]'s follow-up chest x-ray showed no  significant change with persistent bibasilar atelectasis and small pleural  effusions.  He was still on 2 L per nasal cannula.  He was without a  significant shortness of breath.  He did report mild dyspnea on exertion  only.  Based on his low oxygen saturation, particularly with ambulation, Dr.  Cornelius Moras ordered a chest CT scan to rule out pulmonary embolism.  This scan was  negative for pulmonary embolism.  It did confirm presence of atelectasis and  small pleural effusions.  Due to small pleural effusions it was felt he  should continue Lasix and potassium for an additional week.  Otherwise, his  weight was nearly baseline and he showed no lower extremity edema.  His  heart rate, however, was noted to be in the high 90s to low 100s,  sometimes  up to 120.  Increased rate was noted primarily with activity.  Based on  this, his beta blocker was increased to Toprol XL 75 mg daily by cardiology.  By later that afternoon his heart rate remained in the low 100s.  However,  his room air oxygenation was 91% on room air.  He also ambulated that  afternoon and saturated 88-90% on room air.  Both his heart rate and oxygen  saturations were felt stable.  It was felt his heart rate would show some  decrease with the increase in his beta blocker.  His oxygen saturations were  also felt would improve with continued aggressive pulmonary toilet and  diuretic therapy.  Wesley [name] did feel ready for discharge home and it  was felt that this was appropriate and he was discharged home later that  afternoon  in stable condition.   DISCHARGE MEDICATIONS:  (This reflects changes from his previously dictated  discharge summary.)  1. Enteric-coated aspirin 81 mg one p.o. daily.  2. Altace 25 mg one p.o. q.h.s.  3. Lipitor 40 mg one p.o. daily.  4. Nexium 40 mg one p.o. daily.  5. Plavix 75 mg one p.o. daily.  6. Carafate 1 g p.o. q.i.d. per home regimen.  7. Toprol XL 75 mg one p.o. daily.  8. Lasix 40 mg one p.o. daily x7 days.  9. K-Dur 20 mEq one p.o. daily x7 days.  10.      Tylox one to two tablets p.o. q.4-6h. p.r.n. pain.   DISCHARGE INSTRUCTIONS:  All discharge instructions are as previously  dictated.   FOLLOWUP:  1. He will follow up with Dr. Cornelius Moras on February 19, 2004 at 12:30 p.m. with a     chest x-ray one hour before at the [facility].  2. He is to follow up with Dr. Jenne Campus on September 1 at 2:45 p.m.      Jerold Coombe, P.A.                  Salvatore Decent. Cornelius Moras, M.D.    AWZ/MEDQ  D:  02/07/2004  T:  02/08/2004  Job:  [id number]   cc:   Dossie Arbour, M.D.   Darlin Priestly, M.D.  [address]  [place]  [place] [address]  Fax: [phone]   Cristy Hilts. Jacinto Halim, M.D.  [address]  [place]  [place] [address]  Fax: [phone]

## 2010-11-08 NOTE — H&P (Signed)
NAME:  Wesley [name], Wesley [name]               ACCOUNT NO.:  [id number]   MEDICAL RECORD NO.:  [id number]                   PATIENT TYPE:  INP   LOCATION:  1825                                 FACILITY:  [facility]   PHYSICIAN:  Corinna L. Lendell Caprice, MD             DATE OF BIRTH:  [birth year]   DATE OF ADMISSION:  03/05/2004  DATE OF DISCHARGE:                                HISTORY & PHYSICAL   CHIEF COMPLAINT:  Vomiting and fever.   HISTORY OF PRESENT ILLNESS:  Wesley [name] is a pleasant 70 year old white  male, unassigned, who presents to the emergency room with a several day  history of periodic vomiting, subjective fevers and chills, urinary  frequency, who denies dysuria.  He has a history of high PSA and has had  several biopsies in the past of his prostate.  He was in the hospital a  month ago at which time 70 he had coronary artery bypass grafting.   PAST MEDICAL HISTORY:  1.  Severe three-vessel and left main coronary artery disease, status post      CABG on February 02, 2004.  Ejection fraction by catheterization was 45-      50%.  2.  Gastroesophageal reflux disease.  3.  Hypertension.  4.  Hyperlipidemia.  5.  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  6.  History of abnormal PSA.  7.  History of hemorrhoidectomy.   MEDICATIONS:  1.  Nexium 40 mg a day.  2.  Carafate 1 gram p.o. q.i.d.  3.  Toprol XL 75 mg p.o. every day.  4.  Altace 2.5 mg p.o. q.h.s.  5.  Lipitor 40 mg p.o. every day.  6.  Plavix 75 mg a day.   SOCIAL HISTORY:  The patient lives in [place], [place].  He is an  Pensions consultant.  He does not smoke or drink heavily.   FAMILY HISTORY:  Noncontributory.   REVIEW OF SYSTEMS:  As above, otherwise negative except for constipation.   PHYSICAL EXAMINATION:  VITAL SIGNS:  His temperature is 101.3, blood  pressure ranges 90 to 110 over 50 to 60, heart rate 113, oxygen saturation  95%, respiratory rate 18.  GENERAL:  The patient is well  nourished, well developed, in no acute  distress, somewhat diaphoretic.  HEENT:  Normocephalic, atraumatic.  Pupils equal, round and reactive to  light.  Sclerae are nonicteric.  Moist mucous membranes.  No thrush.  NECK:  Supple.  No carotid bruits.  LUNGS:  Clear to auscultation bilaterally without wheezes, rhonchi, or  rales.  CARDIOVASCULAR:  Regular rate and rhythm without murmurs, gallops or rubs.  BACK:  He has some slight left CVA tenderness.  ABDOMEN:  Soft, nontender, nondistended.  GU:  Deferred.  RECTAL:  Deferred.  EXTREMITIES:  No clubbing, cyanosis, or edema.  SKIN:  He has a well healed median sternotomy scar and a small incision  which is healing on his right leg from previous bypass.  NEUROLOGIC:  Alert and oriented.  Cranial nerves and sensory motor exam are  intact.  PSYCHIATRIC:  Calm and cooperative.   LABS:  White blood cell count is 27,000 with 89% neutrophils, 3%  lymphocytes, hemoglobin 11.8, hematocrit 34.6, platelet count 284.  Complete  metabolic panel essentially unremarkable.  UA was cloudy, pH 5, small  bilirubin, 15 ketones, small blood, 30 protein, positive nitrate, small  leukocyte esterase, 3-6 white cells, many bacteria, hyaline casts.   Chest x-ray is negative per ER physician.   ASSESSMENT/PLAN:  1.  Pyelonephritis with possible sepsis.  Given the severe leukocytosis and      borderline blood pressure, the patient will be admitted given      intravenous fluids, intravenous antibiotics.  Blood cultures, urine      cultures have been sent.  The patient will get antipyretics,      antiemetics, and pain medications.  2.  Coronary artery disease, status post coronary artery bypass graft a      month ago, stable.  3.  Gastroesophageal reflux disease.  4.  Hyperlipidemia.  5.  Hypertension.  His Toprol and Altace will be held for now.  6.  History of increased PSA.  7.  Constipation.  We will give Milk of Magnesia.  8.  Nausea, vomiting  secondary to number one.                                                Corinna L. Lendell Caprice, MD    CLS/MEDQ  D:  03/06/2004  T:  03/06/2004  Job:  [id number]   cc:   Darlin Priestly, M.D.  [phone] [address], [address]  [place]  [place] [address]  Fax: [phone]

## 2010-11-08 NOTE — Consult Note (Signed)
NAME:  Wesley [name], Wesley [name]                   ACCOUNT NO.:  [id number]   MEDICAL RECORD NO.:  [id number]                   PATIENT TYPE:  INP   LOCATION:  3701                                 FACILITY:  [facility]   PHYSICIAN:  Salvatore Decent. Cornelius Moras, M.D.              DATE OF BIRTH:  [birth year]   DATE OF CONSULTATION:  02/01/2004  DATE OF DISCHARGE:                                   CONSULTATION   REQUESTING PHYSICIAN:  Dr. Lenise Herald.   PRIMARY CARE PHYSICIAN:  Dr. Vonita Moss.   REASON FOR CONSULTATION:  Left main disease, three-vessel coronary artery  disease.   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  He states that he was in his usual state of health until  approximately 1 year ago when he began to suffer from intermittent episodes  of substernal chest pain.  Initially these symptoms were somewhat sporadic  and waxed and waned and much of his symptoms have somewhat atypical  features.  He has attributed all these to problems with reflux for which he  has been treated in the past.  He did note that his chest pain seemed to  accelerate with associated stress.  Approximately 6 weeks ago he had a  particularly severe episode of sharp substernal chest pain that lasted  several hours.  This was associated with nausea, vomiting, and diaphoresis.  Ultimately, he presented to the emergency room at [facility] where he was admitted for possible unstable angina.  He ruled out for  an acute myocardial infarction based on serial cardiac enzymes.  He did  undergo a stress Cardiolite exam which apparently was felt to be low risk  for possible ischemia.  His symptoms were subsequently attributed to likely  GE reflux disease.  Mr. Tsutsui notes that since then he has  essentially  had continuous chest pain which has never completely gone away.  The  severity of his pain continues to wax and wane somewhat.  He has also noted  marked progression of exertional fatigue as well as moderate exertional  shortness of breath.  His pain seems to be worse at night when he lies down  or sometimes after meals.  He also has some difficulty swallowing although  swallowing itself is not painful.  Ultimately, Mr. Callegari presented with  this set of symptoms to Dr. Jenne Campus who saw him in consultation on January 25, 2004.  Mr. Sainato was subsequently set up for elective cardiac  catheterization.  This was performed this morning and findings are notable  for left main disease with severe three-vessel coronary artery disease and  mild left ventricular dysfunction.  Mr. Dobrowski was promptly admitted to  the hospital and cardiac surgical consultation was requested.   REVIEW OF SYSTEMS:  GENERAL:  Mr. Hynes reports having been under  considerable stress since this past May 2005, primarily related to stress at  work.  He reports that his appetite is good and he has not been gaining or  losing weight.  He has had severe progression of exertional fatigue and he  states that at times he does not feel like getting out of bed in the  morning.  CARDIAC:  Notable for symptoms of chest pain that he describes  both as a burning pain, a sharp pain, and at times a pressing pain.  This is  usually located in the mid sternum without radiation.  When severe it has  been associated with nausea and diaphoresis.  It does seem to be exacerbated  by stress and physical activity and relieved somewhat by rest.  He denies  any episodes of pain waking him from sleep at night.  He has moderate  exertional shortness of breath although he denies any episodes of resting  shortness of breath, PND, orthopnea, or lower extremity edema.  He has not  had any palpitations or syncope.  RESPIRATORY:   Notable for exertional  shortness of breath.  The patient has had a dry cough recently but he denies  productive cough, hemoptysis, or wheezing.  GASTROINTESTINAL:  Notable for  some difficulty swallowing and some problems with mild constipation.  He  denies hematochezia, hematemesis, or melena.  He states that food sometimes  seems to get stuck when he swallows although has never gotten stuck to the  point where he has to throw it back up or cough it up.  MUSCULOSKELETAL:  Notable for some weakness in both legs.  The patient denies problems with  arthritis or arthralgias.  NEUROLOGIC:  Negative.  The patient denies  symptoms suggestive of previous TIA or stroke.  INFECTIOUS:  Negative.  The  patient denies recent fevers or chills.  GENITOURINARY:  Negative.  The  patient denies urinary urgency or frequency.  HEMATOLOGIC:  Negative.  The  patient denies bleeding diathesis or easy bruising.  ENDOCRINE:  Negative.  PSYCHIATRIC:  The patient does report problems with chronic stress.  He has  very frequent headaches, essentially on a daily basis which at times are  quite severe.  HEENT:  Negative.   PAST MEDICAL HISTORY:  1. GE reflux disease status post EGD and dilatation for presumed lower     esophageal stricture although apparently report of EGD at the time there     was no stricture appreciated.  2. Hypertension, newly diagnosed.  3. Hypercholesterolemia, recently diagnosed.  4. History of cancer of the jaw status post multiple surgical procedures and     reconstruction in the early 1970s.   FAMILY HISTORY:  Noncontributory.  The patient denies immediate family  members with premature coronary artery disease.   PAST SURGICAL HISTORY:  The patient underwent resection of his cancer of the  jaw and mouth in the 1970s with reconstruction.  The patient also underwent  hemorrhoidectomy several years previously.  SOCIAL HISTORY:  The patient is divorced and lives alone although he has a   fiance and close companion who he has worked with and been close with for  years.  He has several children who are grown.  He is a nonsmoker.  He  denies history of excessive alcohol consumption.   MEDICATIONS PRIOR TO ADMISSION:  1. Nexium 40 mg daily.  2. Allegra D.  3. Excedrin as needed for headaches - which he takes up  to seven daily.  4. Fioricet one to two tablets q.4h. as needed for headaches.   DRUG ALLERGIES:  None known.   PHYSICAL EXAMINATION:  GENERAL:  The patient is a well-appearing male who  appears his stated age in no acute distress.  He is currently normotensive  and afebrile.  He is in normal sinus rhythm.  HEENT:  Grossly unrevealing.  NECK:  Supple.  There is no cervical nor supraclavicular lymphadenopathy.  There is no jugular venous distention.  No carotid bruits are noted.  CHEST:  Auscultation of the chest includes clear and symmetric breath sounds  bilaterally.  No wheezes or rhonchi are demonstrated.  CARDIOVASCULAR:  Notable for regular rate and rhythm.  No murmurs, rubs, or  gallops are appreciated.  ABDOMEN:  Flat, soft, and nontender.  There are no palpable masses.  Bowel  sounds are present.  EXTREMITIES:  The extremities are warm and well perfused.  There is no lower  extremity edema.  There is no sign of venous insufficiency.  Distal pulses  are palpable in the posterior tibial position on both sides.  RECTAL AND GENITOURINARY:  Both deferred.  NEUROLOGIC:  Grossly nonfocal and symmetrical throughout.   DIAGNOSTIC TESTS:  Cardiac catheterization performed today by Dr. Jenne Campus  has been reviewed.  This demonstrates left main disease and severe three-  vessel coronary artery disease.  Specifically, there is long segment 60%  stenosis of the proximal left main coronary artery.  There is 95% proximal  stenosis of the left anterior descending coronary artery.  Just after  takeoff of the diagonal branch the distal left anterior descending coronary   artery becomes quite small and diffusely diseased.  There is 90% stenosis in  the distal portion of this vessel.  This vessel may be a relatively poor  target for grafting.  There is 50% proximal stenosis of the left circumflex  coronary artery.  The right coronary artery is dominant.  There are diffuse  luminal irregularities in the mid portion of this vessel.  There is 90%  stenosis of the distal right coronary artery just after takeoff of the  posterior descending coronary artery.  The posterolateral branch off the  distal right coronary artery is small to medium sized.   IMPRESSION:  Left main disease with severe three-vessel coronary artery  disease and preserved left ventricular function.  I believe that Mr.  Antolin would best be treated by elective coronary artery bypass grafting.  He does appear to have very diffuse distal coronary artery disease and may have relatively poor targets for grafting, particularly the distal left  anterior descending coronary artery.  However, I clearly believe he would  benefit from surgical revascularization and it is unlikely that percutaneous  coronary intervention could be of much benefit either in the short-term or  the long-term with his coronary anatomy.   PLAN:  I have outlined options at length with Mr. Putman and his fiance.  Alternative treatments for artery disease have been discussed in detail.  They understand and accept those associated risks of surgery including but  not limited to risk of death, stroke, myocardial infarction, congestive  heart failure, respiratory failure, pneumonia, bleeding requiring a blood  transfusion, arrhythmia, infection, and recurrent coronary artery disease.  They understand that with relatively diffuse distal coronary artery disease  he may be at somewhat increased risk for premature graft failure or  recurrent symptoms or signs of coronary disease in the future.  All of their  questions have been  addressed.                                               Salvatore Decent. Cornelius Moras, M.D.    CHO/MEDQ  D:  02/01/2004  T:  02/02/2004  Job:  [id number]   cc:   Darlin Priestly, M.D.  [phone] [address]  [place]  [place] [address]  Fax: [phone]   Vonita Moss, M.D.

## 2010-11-08 NOTE — Procedures (Signed)
[facility]  Patient:    Wesley [name], Wesley [name]                MRN: [id number] Adm. Date:  [id number] Attending:  Mervin Hack CC:         Sheppard Plumber. Earlene Plater, M.D.   Procedure Report  PROCEDURE:  Upper endoscopy and colonoscopy.  SURGEON:  Hedwig Morton. Juanda Chance, M.D.  INDICATIONS:  This is a 70 year old gentleman who is undergoing hemorrhoidectomy by Dr. Kendrick Ranch later today.  He never had a colonoscopy and he is undergoing an exam because of constipation, occasional diarrhea, and rectal bleeding.  There is no family of colon cancer.  He also has been complaining of dysphagia to solids and reflux symptoms.  He has been on Nexium currently, but previously on Zantac 75 mg a day.  He has been taking a lot of Excedrin.  He is undergoing upper endoscopy to rule out esophageal stricture and colonoscopy for evaluation of rectal bleeding.  ENDOSCOPE:  Olympus single channel video endoscope.  SEDATION:  Versed 5 mg IV and Demerol 50 mg IV.  FINDINGS:  The Olympus single channel video endoscope was passed under direct vision through the posterior pharynx into esophagus.  The patient was monitored by pulse oximeter and his oxygen saturations were normal.  Proximal and mid esophagus was unremarkable.  There was slight increase in the fibrous tissue in the distal esophagus, but there was no discrete stricture.  There were no erosions.  There was no significant hiatal hernia.  Stomach:  The patient was insufflated with air and showed normal gastric folds, gastric antrum, and pyloric outlet.  Duodenum:  The duodenum showed large erythematous folds with erythema consistent with mild to moderate duodenitis.  The descending duodenum is normal.  There was no obstruction of the descending duodenum.  The endoscope was then brought back into the stomach.  A CLOtest was taken from the gastric antrum.  The endoscope was then retracted and the stomach decompressed.  A  Maloney dilator, 48-French passed through the esophagus blindly without difficulty.  There was no blood on the dilator.  The patient tolerated the procedure well.  IMPRESSION: 1. Duodenitis, status post CLOtest. 2. Status post passage of 48-French Maloney dilator for dysphagia without    evidence of district stricture.  PLAN:  Colonoscopy. DD:  12/18/00 TD:  12/18/00 Job: [id number]

## 2010-11-08 NOTE — Procedures (Signed)
[facility]  Patient:    Wesley [name], Wesley [name]                MRN: [id number] Adm. Date:  [id number] Attending:  Mervin Hack                           Procedure Report  PROCEDURE:  Colonoscopy.  SURGEON:  Hedwig Morton. Juanda Chance, M.D.  ENDOSCOPE:  Olympus single videoscope.  SEDATION:  Additional Versed 2 mg IV and Demerol 20 mg IV.  FINDINGS:  The Olympus single channel videoscope was passed under direct vision from the rectum to the sigmoid colon.  The patient was again monitored by pulse oximeter.  His oxygen saturations were satisfactory.  His prep was excellent.  There were external as well as internal hemorrhoids which were confirmed by retroflexing the endoscope in the rectum.  Video photographs were obtained.  The sigmoid colon showed a few superficial diverticula, but no significant diverticulosis.  The colonoscope passed easily into the descending colon.  The ________ folds were slightly thickened.  The transverse colon, hepatic flexure, and ascending colon was unremarkable with normal cecum and ileocecal valve.  The colonoscope was then retracted and the colon decompressed.  There were no polyps.  IMPRESSION: 1. First or second grade hemorrhoids, external as well as internal. 2. Minimal diverticulosis of the left colon. 3. _________, likely related to hemorrhoids.  PLAN: 1. As per Dr. Earlene Plater, the patient is for a hemorrhoidectomy today. 2. In the long run, he needs to stay on high fiber diet. 3. Nexium 40 mg per day for gastroesophageal reflux. DD:  12/18/00 TD:  12/18/00 Job: [id number]

## 2010-11-08 NOTE — Op Note (Signed)
[facility]  Patient:    Wesley [name], Wesley [name]                MRN: [id number] Proc. Date: 12/18/00 Adm. Date:  [id number] Attending:  Mervin Hack CC:         Antonietta Jewel, M.D, [facility], [place], [place].   Operative Report  PREOPERATIVE DIAGNOSES:  Internal/external hemorrhoids.  POSTOPERATIVE DIAGNOSES:  Internal/external hemorrhoids.  PROCEDURE:  Hemorrhoidectomy, complex.  SURGEON:  Kendrick Ranch, M.D.  ANESTHESIA:  General.  INDICATIONS FOR PROCEDURE:  [name] is a 70 year old Caucasian male otherwise healthy that I had followed for many years with significant hemorrhoids producing pain, protrusion and bleeding. He has responded well in the past to conservative management; however, at this time, he now has fourth degree internal hemorrhoids and large external hemorrhoids. Because he has not had previous GI evaluation, he has seen and consulted with Dr. Lina Sar who today has completed and upper endoscopy and colonoscopy. He now presents for a hemorrhoidectomy.  DESCRIPTION OF PROCEDURE:  The patient was brought to the operating room, placed supine. LMA anesthesia provided. He was placed in lithotomy position, perianal area inspected, prepped and draped in the usual fashion. Hemorrhoids were rather prominent as large complex hemorrhoidal masses in the left lateral posterior and anterior positions. The area was injected around and about with 0.5% Marcaine with epinephrine mixed 9:1 with Wydase. This was massaged in well, the anatomy reassessed. Each of the hemorrhoids was then removed serially, left lateral, anterior, posterior. A suture ligature of 2-0 plain was placed at the apex of the hemorrhoid. The hemorrhoid was then completely removed as a skinny ellipse including a thin ellipse of the anoderm and the excess external skin. Undermining allowed removal of the superficial varicosities and easy closure of the the wounds  with a running 2-0 plain. The sphincters were spared, there was no other pathology and the procedure was complete. All areas checked for bleeding, none was found. Gelfoam gauze and a dry sterile dressing applied. The patient tolerated the procedure well. Counts were correct. He was extubated and taken to the recovery room in good condition.  Written and verbal instructions were given to him and his fiance and he will be seen and followed as an outpatient. DD:  12/18/00 TD:  12/18/00 Job: [id number]

## 2010-12-16 ENCOUNTER — Other Ambulatory Visit: Payer: Self-pay | Admitting: Emergency Medicine

## 2010-12-16 MED ORDER — CLOPIDOGREL BISULFATE 75 MG PO TABS: 75.0000 mg | ORAL_TABLET | Freq: Every day | ORAL | Status: DC

## 2010-12-19 ENCOUNTER — Encounter: Payer: Self-pay | Admitting: Internal Medicine

## 2011-01-20 ENCOUNTER — Telehealth: Payer: Self-pay | Admitting: *Deleted

## 2011-01-20 ENCOUNTER — Ambulatory Visit (AMBULATORY_SURGERY_CENTER): Payer: Medicare Other | Admitting: *Deleted

## 2011-01-20 VITALS — Ht 69.0 in | Wt 210.0 lb

## 2011-01-20 DIAGNOSIS — Z1211 Encounter for screening for malignant neoplasm of colon: Secondary | ICD-10-CM

## 2011-01-20 MED ORDER — PEG-KCL-NACL-NASULF-NA ASC-C 100 G PO SOLR: ORAL | Status: DC

## 2011-01-20 NOTE — Telephone Encounter (Signed)
Message copied by Richardson Chiquito on Mon Jan 20, 2011  1:28 PM ------      Message from: Hart Carwin      Created: Mon Jan 20, 2011  1:24 PM      Regarding: RE: Plavix stop order       Please stop Plavix 5 days prior to the procedure      ----- Message -----         From: Vernia Buff, CMA         Sent: 01/20/2011  12:29 PM           To: Hart Carwin, MD      Subject: FW: Plavix stop order                                                ----- Message -----         From: [name], RN         Sent: 01/20/2011  11:55 AM           To: Vernia Buff, CMA      Subject: Plavix stop order                                        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  I went ahead and did his PV and kept him on Dr. Regino Schultze schedule for a colon on 02/07/11 since he'd cleared his schedule and court cases already.  He's been on Plavix since 2005 and has been off of it for 10 days prior to cataract surgery and did fine.  He needs an order to come off the Plavix for his colon.  Thanks, [name]

## 2011-01-20 NOTE — Telephone Encounter (Signed)
I have left a message for patient to call back. 

## 2011-01-20 NOTE — Progress Notes (Signed)
Mr. Wesley [name] is on Plavix and needs an order to hold it prior to his colon on 02/07/11.  He said he's held it for his cataract surgery and had no problems.  He's been on the Plavix since 2005. I've sent a note to Dottie re; the need for an order to stop plavix.  [name]

## 2011-01-20 NOTE — Telephone Encounter (Signed)
Wesley [name] notified re: stop Plavix 5 days before procedure/order of Wesley [name]. Wesley [name] Message     Please stop Plavix 5 days prior to the procedure. 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 I went ahead and did his PV and kept him on Wesley [name] schedule for a colon on 02/07/11 since he'd cleared his schedule and court cases already. He's been on Plavix since 2005 and has been off of it for 10 days prior to cataract surgery and did fine. He needs an order to come off the Plavix for his colon. Thanks, Wesley [name]

## 2011-01-21 NOTE — Telephone Encounter (Signed)
Patient has been advised that per Dr Juanda Chance, he may discontinue his Plavix 5 days prior to his colonoscopy procedure. Patient verbalizes understanding of this.

## 2011-01-30 ENCOUNTER — Other Ambulatory Visit: Payer: Medicare Other | Admitting: Internal Medicine

## 2011-02-04 ENCOUNTER — Encounter: Payer: Self-pay | Admitting: *Deleted

## 2011-02-04 ENCOUNTER — Telehealth: Payer: Self-pay | Admitting: Internal Medicine

## 2011-02-04 NOTE — Telephone Encounter (Signed)
error 

## 2011-02-07 ENCOUNTER — Ambulatory Visit (AMBULATORY_SURGERY_CENTER): Payer: Medicare Other | Admitting: Internal Medicine

## 2011-02-07 ENCOUNTER — Encounter: Payer: Self-pay | Admitting: Internal Medicine

## 2011-02-07 VITALS — BP 137/76 | HR 66 | Temp 97.5°F | Resp 16 | Ht 69.0 in | Wt 218.0 lb

## 2011-02-07 DIAGNOSIS — D128 Benign neoplasm of rectum: Secondary | ICD-10-CM

## 2011-02-07 DIAGNOSIS — Z1211 Encounter for screening for malignant neoplasm of colon: Secondary | ICD-10-CM

## 2011-02-07 DIAGNOSIS — D126 Benign neoplasm of colon, unspecified: Secondary | ICD-10-CM

## 2011-02-07 DIAGNOSIS — D129 Benign neoplasm of anus and anal canal: Secondary | ICD-10-CM

## 2011-02-07 MED ORDER — SODIUM CHLORIDE 0.9 % IV SOLN: 500.0000 mL | INTRAVENOUS | Status: DC

## 2011-02-07 NOTE — Patient Instructions (Signed)
RESUME PLAVIX  Please review discharge instructions (blue and green sheets)  Please review information on polyps, high fiber diets, and diverticulosis

## 2011-02-10 ENCOUNTER — Telehealth: Payer: Self-pay | Admitting: *Deleted

## 2011-02-10 NOTE — Telephone Encounter (Signed)
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

## 2011-02-12 ENCOUNTER — Encounter: Payer: Self-pay | Admitting: Internal Medicine

## 2011-03-17 ENCOUNTER — Other Ambulatory Visit: Payer: Self-pay | Admitting: Cardiovascular Disease

## 2011-03-17 MED ORDER — FINASTERIDE 5 MG PO TABS: 5.0000 mg | ORAL_TABLET | Freq: Every day | ORAL | Status: DC

## 2011-04-29 NOTE — Telephone Encounter (Signed)
done

## 2011-06-05 ENCOUNTER — Encounter: Payer: Self-pay | Admitting: Cardiovascular Disease

## 2011-08-07 ENCOUNTER — Telehealth: Payer: Self-pay

## 2011-08-07 MED ORDER — CLOPIDOGREL BISULFATE 75 MG PO TABS: 75.0000 mg | ORAL_TABLET | Freq: Every day | ORAL | Status: DC

## 2011-08-07 NOTE — Telephone Encounter (Signed)
Refill plavix

## 2011-10-09 ENCOUNTER — Other Ambulatory Visit: Payer: Self-pay | Admitting: Cardiovascular Disease

## 2011-10-09 MED ORDER — FINASTERIDE 5 MG PO TABS: 5.0000 mg | ORAL_TABLET | Freq: Every day | ORAL | Status: DC

## 2011-10-10 ENCOUNTER — Other Ambulatory Visit: Payer: Self-pay | Admitting: Cardiovascular Disease

## 2011-10-10 NOTE — Telephone Encounter (Signed)
ERROR

## 2011-11-03 ENCOUNTER — Ambulatory Visit (INDEPENDENT_AMBULATORY_CARE_PROVIDER_SITE_OTHER): Payer: Medicare Other | Admitting: Cardiovascular Disease

## 2011-11-03 ENCOUNTER — Encounter: Payer: Self-pay | Admitting: Cardiovascular Disease

## 2011-11-03 VITALS — BP 134/84 | HR 70 | Ht 69.0 in | Wt 215.0 lb

## 2011-11-03 DIAGNOSIS — R0602 Shortness of breath: Secondary | ICD-10-CM

## 2011-11-03 DIAGNOSIS — E785 Hyperlipidemia, unspecified: Secondary | ICD-10-CM

## 2011-11-03 DIAGNOSIS — I10 Essential (primary) hypertension: Secondary | ICD-10-CM

## 2011-11-03 DIAGNOSIS — I25810 Atherosclerosis of coronary artery bypass graft(s) without angina pectoris: Secondary | ICD-10-CM

## 2011-11-03 DIAGNOSIS — G459 Transient cerebral ischemic attack, unspecified: Secondary | ICD-10-CM

## 2011-11-03 NOTE — Assessment & Plan Note (Signed)
Mild shortness of breath, possibly worse than his last visit. We did offer pharmacologic stress testing. He would like to wait at this time but will call us his symptoms get worse.

## 2011-11-03 NOTE — Patient Instructions (Signed)
You are doing well. No medication changes were made.  Please call us if you have new issues that need to be addressed before your next appt.  Your physician wants you to follow-up in: 6 months.  You will receive a reminder letter in the mail two months in advance. If you don't receive a letter, please call our office to schedule the follow-up appointment.   

## 2011-11-03 NOTE — Assessment & Plan Note (Signed)
Blood pressure is well controlled on today's visit. No changes made to the medications. 

## 2011-11-03 NOTE — Assessment & Plan Note (Signed)
Remote stroke by MRI of the cerebellum on the left. Would continue aspirin, aggressive cholesterol management. No significant disease on carotid ultrasound in the past.

## 2011-11-03 NOTE — Progress Notes (Signed)
Patient ID: KEEGHAN BIALY, male    DOB: Dec 19, 1940, 71 y.o.   MRN: [id number]  HPI Comments: Mr. Husted is a very pleasant 71 year old gentleman who is an active lawyer in town, history of coronary artery disease, bypass x4 in August 2005, history of fall and fracture of his left shoulder and with chronic pain, also with chronic neck and lower back pain  managed by chiropractic, perlipidemia who presents for routine followup. 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 Mild carotid arterial disease, last stress test 2010.   Overall he is doing well. He does not exercise. He continues to work long hours. He does report having some shortness of breath with exertion . His weight has been increasing as he has been working long nights . His upper neck and back is the worst and he has chronic leg pain . He is tolerating Lipitor 80 mg well .    Echocardiogram from March 2010 shows ejection fraction 50-55%, diastolic dysfunction, right ventricular systolic pressure 30-40 mm of mercury, borderline dilated left atrium.  Stress test in March 2010 shows ejection fraction 49%, no EKG changes, exercised for 7 minutes, mild inferior ischemia versus diaphragmatic attenuation.   Cardiac catheterization in August 2005 showing 60% left main disease at the proximal region, 95% ostial LAD disease, diffuse 50-60% disease through the mid LAD and 90% mid LAD disease, 30% diagonal disease, left circumflex with 40-50% mid disease, 50% OM disease at the proximal region of OM 3, 50% distal RCA disease #5% ostial PLB disease with sequential lesion of 40-50%.   EKG shows normal sinus rhythm with rate 70 beats per minute, no significant ST or T wave changes. No significant change from his prior EKG    Outpatient Encounter Prescriptions as of 11/03/2011  Medication Sig Dispense Refill  . aspirin 81 MG EC tablet Take 81 mg by mouth 2 (two) times daily.       Marland Kitchen aspirin-acetaminophen-caffeine (EXCEDRIN MIGRAINE)  250-250-65 MG per tablet Take 1 tablet by mouth every 6 (six) hours as needed.        Marland Kitchen atorvastatin (LIPITOR) 80 MG tablet Take 80 mg by mouth daily.        . B Complex-Biotin-FA (HM VITAMIN B50 COMPLEX PO) Take 1 tablet by mouth daily.        . Calcium Carbonate-Vitamin D (CALCIUM PLUS VITAMIN D PO) Take 1 capsule by mouth daily.        . cetirizine (ZYRTEC) 10 MG tablet Take 10 mg by mouth daily.        . clopidogrel (PLAVIX) 75 MG tablet Take 1 tablet (75 mg total) by mouth daily.  30 tablet  6  . Coenzyme Q10 50 MG CAPS Take 100 mg by mouth daily.       . cyanocobalamin 2000 MCG tablet Take 2,000 mcg by mouth daily.       . finasteride (PROSCAR) 5 MG tablet Take 1 tablet (5 mg total) by mouth daily.  30 tablet  5  . glucosamine-chondroitin 500-400 MG tablet Take 1 tablet by mouth 3 (three) times daily.        Marland Kitchen LECITHIN PO Take 400 mg by mouth daily.        . Methylsulfonylmethane (MSM PO) Take 1 capsule by mouth daily.        . metoprolol succinate (TOPROL-XL) 25 MG 24 hr tablet Take 50 mg by mouth daily.       . Multiple Vitamin (MULTIVITAMIN) tablet Take  1 tablet by mouth daily.        Marland Kitchen omeprazole (PRILOSEC) 20 MG capsule Take 20 mg by mouth daily.        Marland Kitchen zinc gluconate 50 MG tablet Take 50 mg by mouth daily.        . Zinc-Magnesium Aspart-Vit B6 (ZINC MAGNESIUM ASPARTATE PO) Take 1 tablet by mouth daily.        Marland Kitchen Zn-Pyg Afri-Nettle-Saw Palmet (SAW PALMETTO COMPLEX PO) Take 1 capsule by mouth daily.        . traMADol-acetaminophen (ULTRACET) 37.5-325 MG per tablet Take 1 tablet by mouth every 6 (six) hours as needed.         Review of Systems  Constitutional: Negative.   HENT: Negative.   Eyes: Negative.   Respiratory: Positive for shortness of breath.   Cardiovascular: Negative.   Gastrointestinal: Negative.   Musculoskeletal: Positive for back pain.  Skin: Negative.   Neurological: Negative.   Hematological: Negative.   Psychiatric/Behavioral: Negative.   All other  systems reviewed and are negative.   BP 134/84  Pulse 70  Ht 5\' 9"  (1.753 m)  Wt 215 lb (97.523 kg)  BMI 31.75 kg/m2  Physical Exam  Nursing note and vitals reviewed. Constitutional: He is oriented to person, place, and time. He appears well-developed and well-nourished.       Obese  HENT:  Head: Normocephalic.  Nose: Nose normal.  Mouth/Throat: Oropharynx is clear and moist.  Eyes: Conjunctivae are normal. Pupils are equal, round, and reactive to light.  Neck: Normal range of motion. Neck supple. No JVD present.  Cardiovascular: Normal rate, regular rhythm, S1 normal, S2 normal, normal heart sounds and intact distal pulses.  Exam reveals no gallop and no friction rub.   No murmur heard. Pulmonary/Chest: Effort normal and breath sounds normal. No respiratory distress. He has no wheezes. He has no rales. He exhibits no tenderness.  Abdominal: Soft. Bowel sounds are normal. He exhibits no distension. There is no tenderness.  Musculoskeletal: Normal range of motion. He exhibits no edema and no tenderness.  Lymphadenopathy:    He has no cervical adenopathy.  Neurological: He is alert and oriented to person, place, and time. Coordination normal.  Skin: Skin is warm and dry. No rash noted. No erythema.  Psychiatric: He has a normal mood and affect. His behavior is normal. Judgment and thought content normal.           Assessment and Plan

## 2011-11-03 NOTE — Assessment & Plan Note (Signed)
Cholesterol is at goal on the current lipid regimen. No changes to the medications were made.  

## 2011-12-24 ENCOUNTER — Ambulatory Visit: Payer: Self-pay | Admitting: Internal Medicine

## 2012-01-12 ENCOUNTER — Telehealth: Payer: Self-pay | Admitting: Cardiovascular Disease

## 2012-01-12 NOTE — Telephone Encounter (Signed)
LMTCB

## 2012-01-12 NOTE — Telephone Encounter (Signed)
Please advise if appropriate candidate for surg. Thanks!

## 2012-01-12 NOTE — Telephone Encounter (Signed)
Pt needs clearance for left knee arthroscopy partial medial menesectomy. Fax [phone] pt to procedure this week.

## 2012-01-12 NOTE — Telephone Encounter (Signed)
As long as breathing is stable and no new chest pain or symptoms He would be acceptable risk for knee surgery If breathing is worse, we could give him a diuretic to take prior to the surgery He should suggest that they do not use too much IV fluids during and after the procedure.

## 2012-01-14 NOTE — Telephone Encounter (Signed)
LMTCB on home phone # LMTCB on mobile #

## 2012-01-14 NOTE — Telephone Encounter (Signed)
I received another correspondence from [facility] asking for clearance ASAP for surg. tomm 7/25.  I will fax them a letter stating what Dr. Windell Hummingbird response was. I will also let them know I attempted to reach pt 3x to assess for any cardiac symptoms (without success) he has been having per Dr. Windell Hummingbird note.  Will fax letter to Pre admit at [facility].

## 2012-01-15 ENCOUNTER — Ambulatory Visit: Payer: Self-pay | Admitting: Orthopedic Surgery

## 2012-03-15 ENCOUNTER — Other Ambulatory Visit: Payer: Self-pay | Admitting: *Deleted

## 2012-03-15 MED ORDER — CLOPIDOGREL BISULFATE 75 MG PO TABS: 75.0000 mg | ORAL_TABLET | Freq: Every day | ORAL | Status: DC

## 2012-03-15 NOTE — Telephone Encounter (Signed)
Refilled Clopidogrel. 

## 2012-05-10 ENCOUNTER — Ambulatory Visit: Payer: Medicare Other | Admitting: Cardiovascular Disease

## 2012-05-28 ENCOUNTER — Other Ambulatory Visit: Payer: Self-pay | Admitting: *Deleted

## 2012-05-28 MED ORDER — FINASTERIDE 5 MG PO TABS: 5.0000 mg | ORAL_TABLET | Freq: Every day | ORAL | Status: DC

## 2012-05-28 NOTE — Telephone Encounter (Signed)
Refilled Finasteride.

## 2012-06-08 ENCOUNTER — Other Ambulatory Visit (HOSPITAL_COMMUNITY): Payer: Self-pay | Admitting: Cardiovascular Disease

## 2012-06-08 DIAGNOSIS — I509 Heart failure, unspecified: Secondary | ICD-10-CM

## 2012-06-08 DIAGNOSIS — R011 Cardiac murmur, unspecified: Secondary | ICD-10-CM

## 2012-06-08 DIAGNOSIS — Z8673 Personal history of transient ischemic attack (TIA), and cerebral infarction without residual deficits: Secondary | ICD-10-CM

## 2012-06-23 HISTORY — PX: NM MYOCAR PERF WALL MOTION: HXRAD629

## 2012-06-23 HISTORY — PX: JOINT REPLACEMENT: SHX530

## 2012-06-23 HISTORY — PX: TRANSTHORACIC ECHOCARDIOGRAM: SHX275

## 2012-06-24 ENCOUNTER — Ambulatory Visit (HOSPITAL_COMMUNITY)
Admission: RE | Admit: 2012-06-24 | Discharge: 2012-06-24 | Disposition: A | Discharge status: Home / Self Care | Payer: Medicare Other | Source: Ambulatory Visit | Attending: Cardiovascular Disease | Admitting: Cardiovascular Disease

## 2012-06-24 DIAGNOSIS — R011 Cardiac murmur, unspecified: Secondary | ICD-10-CM | POA: Insufficient documentation

## 2012-06-24 DIAGNOSIS — Z8673 Personal history of transient ischemic attack (TIA), and cerebral infarction without residual deficits: Secondary | ICD-10-CM | POA: Insufficient documentation

## 2012-06-24 DIAGNOSIS — I509 Heart failure, unspecified: Secondary | ICD-10-CM | POA: Insufficient documentation

## 2012-06-24 MED ORDER — REGADENOSON 0.4 MG/5ML IV SOLN
0.4000 mg | Freq: Once | INTRAVENOUS | Status: AC
  Administered 2012-06-24: 0.4 mg via INTRAVENOUS

## 2012-06-24 MED ORDER — TECHNETIUM TC 99M SESTAMIBI GENERIC - CARDIOLITE
10.2000 | Freq: Once | INTRAVENOUS | Status: AC | PRN
  Administered 2012-06-24: 10 via INTRAVENOUS

## 2012-06-24 MED ORDER — TECHNETIUM TC 99M SESTAMIBI GENERIC - CARDIOLITE
30.2000 | Freq: Once | INTRAVENOUS | Status: AC | PRN
  Administered 2012-06-24: 30.2 via INTRAVENOUS

## 2012-06-24 NOTE — Procedures (Addendum)
[facility] [address] [place] [address] [phone]  Cardiology Nuclear Med Study  Wesley [name] is a 72 y.o. male     MRN : [id number]     DOB: 05-02-1941  Procedure Date: 06/24/2012  Nuclear Med Background Indication for Stress Test:  Graft Patency History:  CABGx4 2005 Cardiac Risk Factors: Family History - CAD, Hypertension, Lipids and Overweight  Symptoms:  DOE and Fatigue   Nuclear Pre-Procedure Caffeine/Decaff Intake:  1:00am NPO After: 11:00am   IV Site: R Antecubital  IV 0.9% NS with Angio Cath:  22g  Chest Size (in):  44 IV Started by: Koren Shiver, CNMT  Height: 5\' 9"  (1.753 m)  Cup Size: n/a  BMI:  Body mass index is 31.60 kg/(m^2). Weight:  214 lb (97.07 kg)   Tech Comments:  n/a    Nuclear Med Study 1 or 2 day study: 1 day  Stress Test Type:  Lexiscan  Order Authorizing Provider:  Susa Griffins, MD   Resting Radionuclide: Technetium 39m Sestamibi  Resting Radionuclide Dose: 10.2 mCi   Stress Radionuclide:  Technetium 76m Sestamibi  Stress Radionuclide Dose: 30.2 mCi           Stress Protocol Rest HR: 71 Stress HR:  88  Rest BP:  137/81 Stress BP:  131/91  Exercise Time (min): n/a METS: n/a   Predicted Max HR: 148 bpm % Max HR: 47.97 bpm Rate Pressure Product: 21308   Dose of Adenosine (mg):  n/a Dose of Lexiscan: 0.4 mg  Dose of Atropine (mg): n/a Dose of Dobutamine: n/a mcg/kg/min (at max HR)  Stress Test Technologist: Esperanza Sheets, CCT Nuclear Technologist: Gonzella Lex, CNMT   Rest Procedure:  Myocardial perfusion imaging was performed at rest 45 minutes following the intravenous administration of Technetium 76m Sestamibi. Stress Procedure:  The patient received IV Lexiscan 0.4 mg over 15-seconds.  Technetium 68m Sestamibi injected at 30-seconds.  There were no significant changes with Lexiscan.  Quantitative spect images were obtained after a 45 minute  delay.  Transient Ischemic Dilatation (Normal <1.22):  1.12 Lung/Heart Ratio (Normal <0.45):  0.31 QGS EDV:  127 ml QGS ESV:  67 ml LV Ejection Fraction: 47%  Signed by      Rest ECG: NSR at 71  Stress ECG: No significant ST segment change suggestive of ischemia.  There are rare PVCs.  QPS Raw Data Images:  Normal; no motion artifact; normal heart/lung ratio. Stress Images:  Normal homogeneous uptake in all areas of the myocardium. Rest Images:  Normal homogeneous uptake in all areas of the myocardium. Mild diaphragmatic attenuation. Subtraction (SDS):  No evidence of ischemia.  Impression Exercise Capacity:  Lexiscan with no exercise. BP Response:  Normal blood pressure response. Clinical Symptoms:  No significant symptoms noted. ECG Impression:  No significant ST segment change suggestive of ischemia. Comparison with Prior Nuclear Study: No significant change from previous study  Overall Impression:  Normal stress nuclear study.  Low risk stress nuclear study. Mild diaphragmatic attenuation.  LV Wall Motion:  Mild global LV dysfuction with EF 47%, not significantly changed from prier study.   Lennette Bihari, MD  06/24/2012 5:16 PM

## 2012-07-01 ENCOUNTER — Encounter (HOSPITAL_COMMUNITY): Payer: Medicare Other

## 2012-07-01 ENCOUNTER — Ambulatory Visit (HOSPITAL_COMMUNITY)
Admission: RE | Admit: 2012-07-01 | Discharge: 2012-07-01 | Disposition: A | Discharge status: Home / Self Care | Payer: Medicare Other | Source: Ambulatory Visit | Attending: Cardiovascular Disease | Admitting: Cardiovascular Disease

## 2012-07-01 DIAGNOSIS — I059 Rheumatic mitral valve disease, unspecified: Secondary | ICD-10-CM | POA: Insufficient documentation

## 2012-07-01 DIAGNOSIS — I2510 Atherosclerotic heart disease of native coronary artery without angina pectoris: Secondary | ICD-10-CM | POA: Insufficient documentation

## 2012-07-01 DIAGNOSIS — I379 Nonrheumatic pulmonary valve disorder, unspecified: Secondary | ICD-10-CM | POA: Insufficient documentation

## 2012-07-01 DIAGNOSIS — I509 Heart failure, unspecified: Secondary | ICD-10-CM | POA: Insufficient documentation

## 2012-07-01 DIAGNOSIS — Z8673 Personal history of transient ischemic attack (TIA), and cerebral infarction without residual deficits: Secondary | ICD-10-CM

## 2012-07-01 DIAGNOSIS — R011 Cardiac murmur, unspecified: Secondary | ICD-10-CM

## 2012-07-01 NOTE — Progress Notes (Signed)
2D Echo Performed 07/01/2012    Alwaleed Obeso, RCS  

## 2012-07-26 ENCOUNTER — Ambulatory Visit (HOSPITAL_COMMUNITY)
Admission: RE | Admit: 2012-07-26 | Discharge: 2012-07-26 | Disposition: A | Discharge status: Home / Self Care | Payer: Medicare Other | Source: Ambulatory Visit | Attending: Cardiovascular Disease | Admitting: Cardiovascular Disease

## 2012-07-26 ENCOUNTER — Encounter (HOSPITAL_COMMUNITY): Payer: Medicare Other

## 2012-07-26 DIAGNOSIS — Z8673 Personal history of transient ischemic attack (TIA), and cerebral infarction without residual deficits: Secondary | ICD-10-CM

## 2012-07-26 DIAGNOSIS — R011 Cardiac murmur, unspecified: Secondary | ICD-10-CM

## 2012-07-26 DIAGNOSIS — I509 Heart failure, unspecified: Secondary | ICD-10-CM

## 2012-07-26 NOTE — Progress Notes (Signed)
Carotid artery duplex completed.   07/26/2012  Mikeya Tomasetti, RDMS, RDCS  

## 2012-10-04 ENCOUNTER — Other Ambulatory Visit: Payer: Self-pay | Admitting: Cardiovascular Disease

## 2012-10-04 NOTE — Telephone Encounter (Signed)
Refilled Plavix. Pt overdue for 6 month f/u needs appointment. No contact number listed.

## 2012-10-22 ENCOUNTER — Encounter: Payer: Self-pay | Admitting: Cardiovascular Disease

## 2012-10-25 ENCOUNTER — Ambulatory Visit: Payer: Self-pay | Admitting: General Practice

## 2012-10-25 LAB — URINALYSIS, COMPLETE
Bacteria: NONE SEEN
Bilirubin,UR: NEGATIVE
Blood: NEGATIVE
Glucose,UR: NEGATIVE mg/dL (ref 0–75)
Hyaline Cast: 1
Nitrite: NEGATIVE
Protein: NEGATIVE
RBC,UR: 2 /HPF (ref 0–5)
Squamous Epithelial: <1

## 2012-10-25 LAB — BASIC METABOLIC PANEL
Anion Gap: 5 — ABNORMAL LOW (ref 7–16)
BUN: 25 mg/dL — ABNORMAL HIGH (ref 7–18)
Calcium, Total: 9.9 mg/dL (ref 8.5–10.1)
Chloride: 107 mmol/L (ref 98–107)
Co2: 28 mmol/L (ref 21–32)
Creatinine: 0.82 mg/dL (ref 0.60–1.30)
EGFR (Non-African Amer.): >60
Osmolality: 284 (ref 275–301)
Sodium: 140 mmol/L (ref 136–145)

## 2012-10-25 LAB — APTT: Activated PTT: 33.6 secs (ref 23.6–35.9)

## 2012-10-25 LAB — CBC
HCT: 42.1 % (ref 40.0–52.0)
HGB: 14.3 g/dL (ref 13.0–18.0)
MCH: 30.3 pg (ref 26.0–34.0)
MCHC: 34.1 g/dL (ref 32.0–36.0)
MCV: 89 fL (ref 80–100)
Platelet: 241 10*3/uL (ref 150–440)
RDW: 13.1 % (ref 11.5–14.5)

## 2012-10-25 LAB — PROTIME-INR
INR: 0.9
Prothrombin Time: 12.7 secs (ref 11.5–14.7)

## 2012-10-25 LAB — SEDIMENTATION RATE: Erythrocyte Sed Rate: 1 mm/hr (ref 0–20)

## 2012-10-26 LAB — URINE CULTURE

## 2012-11-08 ENCOUNTER — Inpatient Hospital Stay: Payer: Self-pay | Admitting: General Practice

## 2012-11-09 LAB — BASIC METABOLIC PANEL
Anion Gap: 4 — ABNORMAL LOW (ref 7–16)
BUN: 19 mg/dL — ABNORMAL HIGH (ref 7–18)
Calcium, Total: 8.8 mg/dL (ref 8.5–10.1)
Chloride: 106 mmol/L (ref 98–107)
EGFR (Non-African Amer.): >60
Osmolality: 277 (ref 275–301)

## 2012-11-09 LAB — URINALYSIS, COMPLETE
Glucose,UR: NEGATIVE mg/dL (ref 0–75)
Nitrite: NEGATIVE
Ph: 5 (ref 4.5–8.0)
Protein: NEGATIVE
Squamous Epithelial: NONE SEEN
WBC UR: 4 /HPF (ref 0–5)

## 2012-11-09 LAB — HEMOGLOBIN: HGB: 12.3 g/dL — ABNORMAL LOW (ref 13.0–18.0)

## 2012-11-09 LAB — PLATELET COUNT: Platelet: 193 10*3/uL (ref 150–440)

## 2012-11-10 LAB — BASIC METABOLIC PANEL
Calcium, Total: 8.7 mg/dL (ref 8.5–10.1)
Chloride: 109 mmol/L — ABNORMAL HIGH (ref 98–107)
Co2: 28 mmol/L (ref 21–32)
Creatinine: 0.66 mg/dL (ref 0.60–1.30)
Osmolality: 281 (ref 275–301)
Potassium: 3.9 mmol/L (ref 3.5–5.1)
Sodium: 141 mmol/L (ref 136–145)

## 2012-11-10 LAB — PLATELET COUNT: Platelet: 172 10*3/uL (ref 150–440)

## 2012-11-10 LAB — HEMOGLOBIN: HGB: 12 g/dL — ABNORMAL LOW (ref 13.0–18.0)

## 2012-11-11 LAB — URINE CULTURE

## 2012-11-18 ENCOUNTER — Ambulatory Visit: Payer: Medicare Other | Admitting: Cardiovascular Disease

## 2012-12-29 ENCOUNTER — Other Ambulatory Visit: Payer: Self-pay | Admitting: Cardiovascular Disease

## 2012-12-29 NOTE — Telephone Encounter (Signed)
lmtcb to schedule future appointment.

## 2012-12-30 NOTE — Telephone Encounter (Signed)
Notified Wesley [name] needs to make an appointment in order to any medications refilled. Mr. Hardgrove is now being followed at [facility].

## 2013-04-27 ENCOUNTER — Other Ambulatory Visit: Payer: Self-pay | Admitting: Cardiovascular Disease

## 2013-04-27 NOTE — Telephone Encounter (Signed)
LMOM TCB regarding appointment with Dr. Mariah Milling in order to get a refill on medication.

## 2013-04-29 NOTE — Telephone Encounter (Signed)
LMOM TCB regarding refill.

## 2013-09-12 ENCOUNTER — Encounter: Payer: Self-pay | Admitting: *Deleted

## 2013-09-16 ENCOUNTER — Ambulatory Visit (INDEPENDENT_AMBULATORY_CARE_PROVIDER_SITE_OTHER): Payer: Medicare Other | Admitting: Internal Medicine

## 2013-09-16 ENCOUNTER — Encounter: Payer: Self-pay | Admitting: Internal Medicine

## 2013-09-16 VITALS — BP 150/74 | HR 65 | Ht 69.0 in | Wt 195.3 lb

## 2013-09-16 DIAGNOSIS — E785 Hyperlipidemia, unspecified: Secondary | ICD-10-CM

## 2013-09-16 DIAGNOSIS — Z951 Presence of aortocoronary bypass graft: Secondary | ICD-10-CM

## 2013-09-16 DIAGNOSIS — I10 Essential (primary) hypertension: Secondary | ICD-10-CM

## 2013-09-16 NOTE — Progress Notes (Signed)
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 He has a history of coronary artery disease status post catheterization in 2005 which showed multivessel coronary disease. He underwent four-vessel CABG by Wesley [name] in August 2005, with LIMA to diagonal, RIMA to RCA, SVG to distal LAD and SVG to circumflex marginal. He recently had a nuclear stress test in January 2014 which showed no ischemia, but EF was 47%. Echo showed EF 50-55% with no wall motion abnormalities. He also has a history of abnormal PSA, hypertension, dyslipidemia and total knee replacement that was recently performed at [facility]. He is without complaints today.  PMHx:  Past Medical History  Diagnosis Date  . Coronary atherosclerosis of autologous vein bypass graft   . Synovitis and tenosynovitis, unspecified   . Unspecified vitamin D deficiency   . Hyperlipidemia   . Essential hypertension, benign   . Chronic back pain   . Intermittent vertigo   . GERD (gastroesophageal reflux disease)   . Allergic rhinitis   . Labral tear of shoulder     w/ fracture  . Macular degeneration   . Stroke   . S/P CABG x 4 2005  . OSA (obstructive sleep apnea)     sleep study 06/03/2012 - AHI 54.74/hr during total sleep, AHI 49.66/hr during REM    Past Surgical History  Procedure Laterality Date  . 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 Cataract extraction, bilateral    . Hemorrhoid surgery  2002  . Upper gastrointestinal endoscopy    . Tonsillectomy and adenoidectomy    . Colonoscopy    . Total knee arthroplasty      Dr. Marry [name] [facility], [name])  . Transthoracic echocardiogram  06/2012    EF 50-55%, mild MR; LA mild-mod dilated; RVSP increased  . Nm myocar perf wall motion  06/2012      lexiscan myoview - normal low risk study, EF 47%  . Cardiac catheterization  02/01/2004    L main with 60% prox stenosis, LAD calcified in prox section with 95% ostial lesion, diffusion 50-60% disease in mid LAD with focal 90% mid LAD stenosis; 1st diagonal coarsely irregular with up to 30% stenosis in prox & mid segments; L Cfx with 3 OMs, AV groove Cfx with 40-50% stenosis; 3rd OM with 50% prox stenosis; RCA with PDA & PLA, PLA with 95% osital lesion (Wesley [name])  . Coronary artery bypass graft  02/02/2004    LIMA to DX1, RIMA to RCA, SVG to distal LAD, SVG to Cfx marginal (Wesley [name])    FAMHx:  Family History  Problem Relation Age of Onset  . Heart disease Mother   . Parkinsonism Father     SOCHx:   reports that he has never smoked. He has never used smokeless tobacco. He reports that he drinks alcohol. He reports that he does not use illicit drugs.  ALLERGIES:  Allergies  Allergen Reactions  . Amoxicillin     May have caused congestion    ROS: A comprehensive review of systems was negative.  HOME MEDS: Current Outpatient Prescriptions  Medication Sig Dispense Refill  . Ascorbic Acid (VITAMIN C) 1000 MG tablet Take 1,000 mg by mouth daily.       Marland Kitchen aspirin 81 MG  EC tablet Take 81 mg by mouth 2 (two) times daily.       Marland Kitchen aspirin-acetaminophen-caffeine (EXCEDRIN MIGRAINE) 250-250-65 MG per tablet Take 1 tablet by mouth every 6 (six) hours as needed.        Marland Kitchen atorvastatin (LIPITOR) 80 MG tablet Take 80 mg by mouth daily.        . Calcium Carbonate-Vitamin D (CALCIUM PLUS VITAMIN D PO) Take 600 mg by mouth daily.       . Cholecalciferol (VITAMIN D-3) 1000 UNITS CAPS Take 2,000 Units by mouth daily.      . Coenzyme Q10 50 MG CAPS Take 200 mg by mouth daily.       Marland Kitchen CRANBERRY PO Take 84 mg by mouth daily.      . cyanocobalamin 2000 MCG tablet Take 1,000 mcg by mouth daily.       Marland Kitchen GARLIC PO Take 1,308 mg by mouth daily.      Marland Kitchen glucosamine-chondroitin 500-400 MG tablet Take  1 tablet by mouth 3 (three) times daily.        . Ibuprofen-Diphenhydramine Cit (ADVIL PM PO) Take by mouth as needed.      . Loratadine (CLARITIN PO) Take by mouth daily as needed.      Marland Kitchen LUMIGAN 0.01 % SOLN Place 1 drop into both eyes daily.      . magnesium oxide (MAG-OX) 400 MG tablet Take 400 mg by mouth daily.      . Methylsulfonylmethane (MSM PO) Take 1 capsule by mouth daily.        . metoprolol succinate (TOPROL-XL) 25 MG 24 hr tablet Take 50 mg by mouth daily.       . Multiple Vitamin (MULTIVITAMIN) tablet Take 1 tablet by mouth daily.        . Omega-3 Fatty Acids (FISH OIL PO) Take 400 mg by mouth daily. Fish, flax, borage oil with omega      . tamsulosin (FLOMAX) 0.4 MG CAPS capsule Take 0.4 mg by mouth daily.      . traMADol (ULTRAM) 50 MG tablet Take 50 mg by mouth daily.       Marland Kitchen zinc gluconate 50 MG tablet Take 100 mg by mouth daily.       Marland Kitchen Zn-Pyg Afri-Nettle-Saw Palmet (SAW PALMETTO COMPLEX PO) Take 1 capsule by mouth daily.         No current facility-administered medications for this visit.    LABS/IMAGING: No results found for this or any previous visit (from the past 48 hour(s)). No results found.  VITALS: BP 150/74  Pulse 65  Ht 5\' 9"  (1.753 m)  Wt 195 lb 4.8 oz (88.587 kg)  BMI 28.83 kg/m2  EXAM: General appearance: alert and no distress Neck: no carotid bruit and no JVD Lungs: clear to auscultation bilaterally Heart: regular rate and rhythm, S1, S2 normal, no murmur, click, rub or gallop Abdomen: soft, non-tender; bowel sounds normal; no masses,  no organomegaly Extremities: extremities normal, atraumatic, no cyanosis or edema Pulses: 2+ and symmetric Skin: Skin color, texture, turgor normal. No rashes or lesions Neurologic: Grossly normal Psych: Mood, affect normal  EKG: Normal sinus rhythm at 65, incomplete right bundle branch block  ASSESSMENT: 1. Coronary artery disease status post four-vessel CABG in 2005 (see  above) 2. Hypertension 3. Dyslipidemia  PLAN: 1.   Wesley [name] is doing well without any new anginal complaints or worsening shortness of breath. He recently had knee surgery and tolerated that well. He's had a recent low  risk Myoview. Overall he seems to be doing well. His cholesterol is at goal. Blood pressure is somewhat elevated today however he said he's had some arguments with his fiance and has been under some stress recently. I've asked him to continue to follow his blood pressure at home and notify me if it runs high as we may need to adjust his medications.  Plan to see him back in 6 months.  Pixie Casino, MD, [facility] Attending Cardiologist [facility]  Matisyn Cabeza C 09/16/2013, 3:51 PM

## 2013-09-16 NOTE — Patient Instructions (Signed)
No changes to your medications.    Follow-up in 6 months.

## 2014-03-10 ENCOUNTER — Telehealth: Payer: Self-pay | Admitting: Internal Medicine

## 2014-03-10 NOTE — Telephone Encounter (Signed)
Left a message for patient to call back. 

## 2014-03-13 NOTE — Telephone Encounter (Signed)
Left a message for patient to call back. 

## 2014-03-13 NOTE — Telephone Encounter (Signed)
Patient arrived at clinic. He states he is having GERD and would like to schedule an OV. He can do Monday or Friday in afternoon. Scheduled on 04/07/14 at 2:00 PM.

## 2014-03-13 NOTE — Telephone Encounter (Signed)
Decided to walk in to the office instead of calling you back. He is hard to get  ahold of he tells me.

## 2014-03-16 ENCOUNTER — Encounter: Payer: Self-pay | Admitting: *Deleted

## 2014-04-07 ENCOUNTER — Ambulatory Visit (INDEPENDENT_AMBULATORY_CARE_PROVIDER_SITE_OTHER): Payer: Medicare Other | Admitting: Internal Medicine

## 2014-04-07 ENCOUNTER — Other Ambulatory Visit (INDEPENDENT_AMBULATORY_CARE_PROVIDER_SITE_OTHER): Payer: Medicare Other

## 2014-04-07 ENCOUNTER — Encounter: Payer: Self-pay | Admitting: Internal Medicine

## 2014-04-07 VITALS — BP 112/78 | HR 72 | Ht 66.5 in | Wt 193.1 lb

## 2014-04-07 DIAGNOSIS — R195 Other fecal abnormalities: Secondary | ICD-10-CM

## 2014-04-07 DIAGNOSIS — R1013 Epigastric pain: Secondary | ICD-10-CM

## 2014-04-07 LAB — CBC WITH DIFFERENTIAL/PLATELET
Basophils Absolute: 0 10*3/uL (ref 0.0–0.1)
Basophils Relative: 0.3 % (ref 0.0–3.0)
EOS PCT: 2.7 % (ref 0.0–5.0)
Eosinophils Absolute: 0.3 10*3/uL (ref 0.0–0.7)
HEMATOCRIT: 46.1 % (ref 39.0–52.0)
HEMOGLOBIN: 15 g/dL (ref 13.0–17.0)
LYMPHS ABS: 3.1 10*3/uL (ref 0.7–4.0)
Lymphocytes Relative: 30.7 % (ref 12.0–46.0)
MCHC: 32.5 g/dL (ref 30.0–36.0)
MCV: 89.8 fl (ref 78.0–100.0)
MONOS PCT: 6.6 % (ref 3.0–12.0)
Monocytes Absolute: 0.7 10*3/uL (ref 0.1–1.0)
NEUTROS ABS: 6 10*3/uL (ref 1.4–7.7)
Neutrophils Relative %: 59.7 % (ref 43.0–77.0)
Platelets: 276 10*3/uL (ref 150.0–400.0)
RBC: 5.14 Mil/uL (ref 4.22–5.81)
RDW: 13.8 % (ref 11.5–15.5)
WBC: 10.1 10*3/uL (ref 4.0–10.5)

## 2014-04-07 MED ORDER — TRAMADOL HCL 50 MG PO TABS: ORAL_TABLET | ORAL | Status: DC

## 2014-04-07 MED ORDER — ESOMEPRAZOLE MAGNESIUM 40 MG PO CPDR: 40.0000 mg | DELAYED_RELEASE_CAPSULE | Freq: Every day | ORAL | Status: DC

## 2014-04-07 NOTE — Patient Instructions (Addendum)
You have been scheduled for an endoscopy. Please follow written instructions given to you at your visit today. If you use inhalers (even only as needed), please bring them with you on the day of your procedure. Your physician has requested that you go to www.startemmi.com and enter the access code given to you at your visit today. This web site gives a general overview about your procedure. However, you should still follow specific instructions given to you by our office regarding your preparation for the procedure.  We have sent the following medications to your pharmacy for you to pick up at your convenience: Tramadol Nexium  Please discontinue Exedrin and take tramadol in its place for headache.  Your physician has requested that you go to the basement for the following lab work before leaving today: CBC  CC:Dr Ramonita Lab

## 2014-04-07 NOTE — Progress Notes (Signed)
Wesley [name] 10-24-1940 [id number]  Note: This dictation was prepared with Dragon digital system. Any transcriptional errors that result from this procedure are unintentional.   History of Present Illness:  This is a 73 year old white male with a recent onset of epigastric and periumbilical pain which occurs every day, more so at the end of the day. He takes at least 4 Excedrin a day, 2 at a time for headaches. He also takes 2 baby aspirin daily. He denies any melenic stools but has been feeling tired recently. We have seen him in the past for colorectal screening. His last colonoscopy in August 2012 showed a tubular adenoma which was removed. He had a prior colonoscopy and endoscopy in 2002. Patient underwent a total knee replacement in the past. He has coronary artery disease and had bypass graft in August 2005. His ejection fraction is 47%. He has had elevated PSA levels followed by Wesley [name]. He has a positive family history of gallbladder disease in his father who had a cholecystectomy at a  young age.     Past Medical History  Diagnosis Date  . Coronary atherosclerosis of autologous vein bypass graft   . Synovitis and tenosynovitis, unspecified   . Unspecified vitamin D deficiency   . Hyperlipidemia   . Essential hypertension, benign     pt denies 04/07/14  . Chronic back pain   . Intermittent vertigo   . GERD (gastroesophageal reflux disease)   . Allergic rhinitis   . Labral tear of shoulder     w/ fracture  . Macular degeneration   . Stroke   . S/P CABG x 4 2005  . OSA (obstructive sleep apnea)     sleep study 06/03/2012 - AHI 54.74/hr during total sleep, AHI 49.66/hr during REM  . Tubular adenoma of colon     Past Surgical History  Procedure Laterality Date  . 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 Cataract extraction, bilateral    . Hemorrhoid surgery  2002  . Upper gastrointestinal endoscopy    .  Tonsillectomy and adenoidectomy    . Colonoscopy    . Total knee arthroplasty Left     Dr. Marry Guan [facility], [place])  . Transthoracic echocardiogram  06/2012    EF 50-55%, mild MR; LA mild-mod dilated; RVSP increased  . Nm myocar perf wall motion  06/2012    lexiscan myoview - normal low risk study, EF 47%  . Cardiac catheterization  02/01/2004    L main with 60% prox stenosis, LAD calcified in prox section with 95% ostial lesion, diffusion 50-60% disease in mid LAD with focal 90% mid LAD stenosis; 1st diagonal coarsely irregular with up to 30% stenosis in prox & mid segments; L Cfx with 3 OMs, AV groove Cfx with 40-50% stenosis; 3rd OM with 50% prox stenosis; RCA with PDA & PLA, PLA with 95% osital lesion (Dr. Gerrie Nordmann)  . Coronary artery bypass graft  02/02/2004    LIMA to DX1, RIMA to RCA, SVG to distal LAD, SVG to Cfx marginal (Dr. Remus Loffler)    Allergies  Allergen Reactions  . Amoxicillin     May have caused congestion    Family history and social history have been reviewed.  Review of Systems: Denies dysphagia. Positive for epigastric pain denies nausea or vomiting , positive for 15 pound weight loss after knee replacement  The remainder of the 10 point ROS is negative except as outlined in  the H&P  Physical Exam: General Appearance Well developed, in no distress Eyes  Non icteric  HEENT  Non traumatic, normocephalic  Mouth No lesion, tongue papillated, no cheilosis Neck Supple without adenopathy, thyroid not enlarged, no carotid bruits, no JVD Lungs Clear to auscultation bilaterally, post thoracotomy scar  COR Normal S1, normal S2, regular rhythm, no murmur, quiet precordium Abdomen Tenderness left upper quadrant on left costal margin and in the epigastrium. No rebound. Normal active bowel sounds. Liver edge at costal margin. Lower abdomen unremarkable  Rectal Weakly Hemoccult positive stool  Extremities  No pedal edema Skin No lesions Neurological Alert and oriented x  3 Psychological Normal mood and affect  Assessment and Plan:   Problem #28 73 year old white male with epigastric pain suggestive of gastritis, aspirin gastropathy or peptic ulcer disease. He has been taking excess aspirin for headaches as well as for cardiovascular prophylaxis. He is Hemoccult-positive on my exam today suggesting that he may havelow-grade bleeding., likely an upper gi source. We will proceed with EGD. He is up-to-date on  colonoscopy. He will be due for a recall in August 2017. We will start Nexium 40 mg daily, and will check a CBC today. I have discussed stopping aspirin and Excedrin and taking tramadol 50 mg instead.    Wesley [name] 04/07/2014

## 2014-04-17 ENCOUNTER — Encounter: Payer: Self-pay | Admitting: Internal Medicine

## 2014-05-03 ENCOUNTER — Encounter: Payer: Self-pay | Admitting: Internal Medicine

## 2014-05-03 ENCOUNTER — Ambulatory Visit (AMBULATORY_SURGERY_CENTER): Payer: Medicare Other | Admitting: Internal Medicine

## 2014-05-03 VITALS — BP 114/67 | HR 65 | Temp 98.0°F | Resp 15 | Ht 66.5 in | Wt 193.0 lb

## 2014-05-03 DIAGNOSIS — K2950 Unspecified chronic gastritis without bleeding: Secondary | ICD-10-CM

## 2014-05-03 DIAGNOSIS — K259 Gastric ulcer, unspecified as acute or chronic, without hemorrhage or perforation: Secondary | ICD-10-CM

## 2014-05-03 DIAGNOSIS — R1013 Epigastric pain: Secondary | ICD-10-CM

## 2014-05-03 MED ORDER — SODIUM CHLORIDE 0.9 % IV SOLN: 500.0000 mL | INTRAVENOUS | Status: DC

## 2014-05-03 NOTE — Progress Notes (Signed)
Called to room to assist during endoscopic procedure.  Patient ID and intended procedure confirmed with present staff. Received instructions for my participation in the procedure from the performing physician.  

## 2014-05-03 NOTE — Progress Notes (Signed)
No problems noted in the recovery room. maw 

## 2014-05-03 NOTE — Patient Instructions (Signed)
YOU HAD AN ENDOSCOPIC PROCEDURE TODAY AT THE  ENDOSCOPY CENTER: Refer to the procedure report that was given to you for any specific questions about what was found during the examination.  If the procedure report does not answer your questions, please call your gastroenterologist to clarify.  If you requested that your care partner not be given the details of your procedure findings, then the procedure report has been included in a sealed envelope for you to review at your convenience later.  YOU SHOULD EXPECT: Some feelings of bloating in the abdomen. Passage of more gas than usual.  Walking can help get rid of the air that was put into your GI tract during the procedure and reduce the bloating. If you had a lower endoscopy (such as a colonoscopy or flexible sigmoidoscopy) you may notice spotting of blood in your stool or on the toilet paper. If you underwent a bowel prep for your procedure, then you may not have a normal bowel movement for a few days.  DIET: Your first meal following the procedure should be a light meal and then it is ok to progress to your normal diet.  A half-sandwich or bowl of soup is an example of a good first meal.  Heavy or fried foods are harder to digest and may make you feel nauseous or bloated.  Likewise meals heavy in dairy and vegetables can cause extra gas to form and this can also increase the bloating.  Drink plenty of fluids but you should avoid alcoholic beverages for 24 hours.  ACTIVITY: Your care partner should take you home directly after the procedure.  You should plan to take it easy, moving slowly for the rest of the day.  You can resume normal activity the day after the procedure however you should NOT DRIVE or use heavy machinery for 24 hours (because of the sedation medicines used during the test).    SYMPTOMS TO REPORT IMMEDIATELY: A gastroenterologist can be reached at any hour.  During normal business hours, 8:30 AM to 5:00 PM Monday through Friday,  call [phone].  After hours and on weekends, please call the GI answering service at [phone] who will take a message and have the physician on call contact you.    Following upper endoscopy (EGD)  Vomiting of blood or coffee ground material  New chest pain or pain under the shoulder blades  Painful or persistently difficult swallowing  New shortness of breath  Fever of 100F or higher  Black, tarry-looking stools  FOLLOW UP: If any biopsies were taken you will be contacted by phone or by letter within the next 1-3 weeks.  Call your gastroenterologist if you have not heard about the biopsies in 3 weeks.  Our staff will call the home number listed on your records the next business day following your procedure to check on you and address any questions or concerns that you may have at that time regarding the information given to you following your procedure. This is a courtesy call and so if there is no answer at the home number and we have not heard from you through the emergency physician on call, we will assume that you have returned to your regular daily activities without incident.  SIGNATURES/CONFIDENTIALITY: You and/or your care partner have signed paperwork which will be entered into your electronic medical record.  These signatures attest to the fact that that the information above on your After Visit Summary has been reviewed and is understood.  Full   responsibility of the confidentiality of this discharge information lies with you and/or your care-partner.     Handouts were given to your care partner on gastritis. You may resume your current medications today.  Except hold aspirin  And excedrin products.  Tramadol 50 mg when necessary for headache. Await biopsy results. Please call if any questions or concerns.

## 2014-05-03 NOTE — Progress Notes (Signed)
A/ox3, pleased with MAC, report to RN 

## 2014-05-03 NOTE — Op Note (Signed)
[place]  [address] [place], [id number]   ENDOSCOPY PROCEDURE REPORT  PATIENT: Wesley [name], Wesley [name]  MR#: [id number] BIRTHDATE: Aug 16, 1940 , 73  yrs. old GENDER: male ENDOSCOPIST: Lafayette Dragon, MD REFERRED BY:  Dr Ramonita Lab PROCEDURE DATE:  05/03/2014 PROCEDURE:  EGD w/ biopsy ASA CLASS:     Class II INDICATIONS:  epigastric pain and patient taking excess aspirin and Excedrin for headaches.  Heme positive stool.  Started on Nexium 1 week ago.  Pain has improved. MEDICATIONS: Monitored anesthesia care and Propofol 200 mg IV TOPICAL ANESTHETIC: none  DESCRIPTION OF PROCEDURE: After the risks benefits and alternatives of the procedure were thoroughly explained, informed consent was obtained.  The LB [id number] endoscope was introduced through the mouth and advanced to the second portion of the duodenum , Without limitations.  The instrument was slowly withdrawn as the mucosa was fully examined.    Esophagus: proximal, mid and distal esophageal mucosa was normal. Squamocolumnar junction was irregular. There was small 1 cm reducible hiatal hernia Stomach: gastric antrum was intensely erythematous and there was a small 1 cm triangular-shaped a gastric ulcer in the prepyloric antrum. It was clean based and show no stigmata of bleeding. Surrounding mucosa was intensely erythematous. Multiple biopsies were obtained to rule out H. pylori. Retroflexion of the endoscope revealed normal fundus and cardia. Pyloric outlet was normal Duodenum: duodenal bulb and descending duodenum was normal[ The scope was then withdrawn from the patient and the procedure completed.  COMPLICATIONS: There were no immediate complications.  ENDOSCOPIC IMPRESSION: 1.moderately severe antral gastritis 2. Benign-appearing prepyloric ulcer 1 cm. Status post biopsies. No stigmata of bleeding   RECOMMENDATIONS: 1.  Await biopsy results 2.  Avoid aspirin and Excedrin  products Continue Nexium 40 mg daily Tramadol 50 mg when necessary headache  REPEAT EXAM: for EGD pending biopsy results.  eSigned:  Lafayette Dragon, MD 05/03/2014 4:35 PM    CC:  PATIENT NAME:  Jathan, Balling MR#: [id number]

## 2014-05-04 ENCOUNTER — Telehealth: Payer: Self-pay | Admitting: *Deleted

## 2014-05-04 NOTE — Telephone Encounter (Signed)
  Follow up Call-  Call back number 05/03/2014  Post procedure Call Back phone  # [phone]  Permission to leave phone message Yes     Patient questions:  Do you have a fever, pain , or abdominal swelling? No. Pain Score  0 *  Have you tolerated food without any problems? Yes.    Have you been able to return to your normal activities? Yes.    Do you have any questions about your discharge instructions: Diet   No. Medications  No. Follow up visit  No.  Do you have questions or concerns about your Care? No.  Actions: * If pain score is 4 or above: No action needed, pain <4.

## 2014-05-09 ENCOUNTER — Encounter: Payer: Self-pay | Admitting: Internal Medicine

## 2014-06-01 ENCOUNTER — Other Ambulatory Visit: Payer: Self-pay | Admitting: Internal Medicine

## 2014-06-07 ENCOUNTER — Encounter: Payer: Medicare Other | Admitting: Internal Medicine

## 2014-06-29 ENCOUNTER — Other Ambulatory Visit: Payer: Self-pay | Admitting: Internal Medicine

## 2014-09-11 ENCOUNTER — Other Ambulatory Visit: Payer: Self-pay | Admitting: Internal Medicine

## 2014-09-13 ENCOUNTER — Other Ambulatory Visit: Payer: Self-pay | Admitting: Internal Medicine

## 2014-09-13 NOTE — Telephone Encounter (Signed)
Sent Rx for Nexium, 40 mg, #30 with 2 refills to [facility] in [place], [place] on 09/13/14.

## 2014-10-05 ENCOUNTER — Telehealth: Payer: Self-pay | Admitting: Internal Medicine

## 2014-10-05 NOTE — Telephone Encounter (Signed)
Left message instructing pt to call.

## 2014-10-05 NOTE — Telephone Encounter (Signed)
Pt's office manager, Erasmo Downer called in stating that for the past several days the pt having some chest pain, and back pain. He hasnt been able to work and he feels he should come in and be seen. Please f/u with the PT   Thanks

## 2014-10-09 NOTE — Telephone Encounter (Signed)
Is Dr. Debara Pickett ok with the pt now seeing Dr. Ellyn Hack ?

## 2014-10-09 NOTE — Telephone Encounter (Signed)
Has this been handled?

## 2014-10-09 NOTE — Telephone Encounter (Signed)
Will defer message to Dr. Debara Pickett to sign off on cardiologist change?

## 2014-10-09 NOTE — Telephone Encounter (Signed)
Spoke with patient. He c/o chest pain but thinks this is acid reflux related as he states he has been out of his nexium. Informed him Dr. Olevia Perches sent Rx to pharmacy in March.   Patient wishes to change from Dr. Debara Pickett to Dr. Ellyn Hack. He was concerned that he has not followed up in a year with a cardiologist and RN apologized and told him Dr. Debara Pickett had wanted him to follow up in 6 months and it must have been missed with the recall system.   Message deferred to scheduler Rollene Fare to contact patient to be scheduled for yearly ROV with Dr. Ellyn Hack and patient would like to be placed on waiting list for soonest appointment. Best to call mobile or work #

## 2014-10-10 NOTE — Op Note (Signed)
PATIENT NAME:  Wesley [name], Wesley [name] MR#:  [id number] DATE OF BIRTH:  08-26-1940  DATE OF PROCEDURE:  01/15/2012  PREOPERATIVE DIAGNOSIS: Medial meniscus tear left knee, degenerative osteoarthritis.   POSTOPERATIVE DIAGNOSIS:  Medial meniscus tear left knee, degenerative osteoarthritis, lateral meniscus tear.   PROCEDURE: Arthroscopy, partial medial and lateral meniscectomy.   SURGEON: Laurene Footman, M.D.   ANESTHESIA: General.     DESCRIPTION OF PROCEDURE:  The patient was brought to the operating room and after adequate anesthesia was obtained, the left leg was prepped and draped in the usual sterile fashion with the arthroscopic legholder and tourniquet applied. After prepping and draping in the usual sterile fashion and appropriate patient identification and time-out procedures were completed, an inferolateral portal was made and the arthroscope was introduced. Initial inspection revealed moderate patellofemoral degenerative change with good patellofemoral tracking. Checking along the gutter, there were no loose bodies. There were some loose pieces of articular cartilage. There were extensive degenerative changes to the medial compartment. No exposed bone. Significant partial thickness loss noted on both femoral and tibial sides. An inferomedial portal was made and on probing there was a complex tear of the posterior third of the meniscus involving most of the posterior third with horizontal and vertical components. The anterior cruciate ligament was intact and going to the lateral compartment there was a flap tear in the middle third. An ArthroCare wand was used to ablate this as well as some anterior tearing of the lateral meniscus. Going back to the medial meniscus, an arthroscopic shaver was utilized to the debride the meniscus tear followed by the ArthroCare wand getting back to a stable margin. After thorough irrigation of the knee and checking the gutters to make sure there were no loose  bodies, all instrumentation was withdrawn. The wounds were closed with simple interrupted 4-0 nylon. 20 mL of 0.5% Sensorcaine were infiltrated into the portals. Xeroform, 4 x 4's, Webril, and Ace wrap were applied. The patient was sent to the recovery room in stable condition.   ESTIMATED BLOOD LOSS: Minimal.   COMPLICATIONS: None.   SPECIMEN:  None.   ____________________________ Laurene Footman, MD [id number] D: 01/15/2012 21:15:27 ET T: 01/16/2012 10:22:05 ET JOB#: [id number]  cc: Laurene Footman, MD, <Dictator> Laurene Footman MD ELECTRONICALLY SIGNED 01/16/2012 17:03

## 2014-10-13 NOTE — Op Note (Signed)
PATIENT NAME:  Wesley [name], Wesley [name] MR#:  [id number] DATE OF BIRTH:  24-Jan-1941  DATE OF PROCEDURE:  11/08/2012  PREOPERATIVE DIAGNOSIS: Degenerative arthrosis of the left knee.   POSTOPERATIVE DIAGNOSIS: Degenerative arthrosis of the left knee.   PROCEDURE PERFORMED: Left total knee arthroplasty using computer-assisted navigation.   SURGEON: Laurice Record. Hooten, M.D.   ASSISTANT:  Vance Peper, PA.   ANESTHESIA: Femoral nerve block and spinal.   ESTIMATED BLOOD LOSS: 50 mL.   Fluids replaced: 1500 mL of crystalloid.   TOURNIQUET TIME: 103 minutes.   DRAINS: Two medium drains to reinfusion system.   SOFT TISSUE RELEASES: Anterior cruciate ligament, posterior cruciate ligament, deep and superficial medial collateral ligament and patellofemoral ligament.   IMPLANTS UTILIZED: DePuy PFC Sigma size 4 posterior stabilized femoral component (cemented), size 4 MBT tibial component (cemented), 41 mm three peg oval dome patella (cemented) and a 10 mm stabilized rotating platform polyethylene insert.  INDICATIONS FOR SURGERY: The patient is a 74 year old gentleman who has been seen for complaints of progressive left knee pain. X-rays demonstrated degenerative changes in tricompartmental fashion with relative varus deformity. After discussion of the risks and benefits of surgical intervention, the patient expressed understanding of the risks and benefits and agreed with plans for surgical intervention.   PROCEDURE IN DETAIL: The patient was brought into the operating room and, after adequate femoral nerve block and spinal anesthesia was achieved, a tourniquet was placed on the patient's upper left thigh. The patient's left knee and leg were cleaned and prepped with alcohol and DuraPrep draped in the usual sterile fashion. A "timeout" was performed as per usual protocol. The left lower extremity was exsanguinated using an Esmarch and the tourniquet was inflated to 300 mmHg. After a longitudinal incision  was made, followed by a standard mid vastus approach, a moderate effusion was evacuated. The deep fibers of the medial collateral ligament were elevated in a subperiosteal fashion off the medial flare of the tibia, so as to maintain a continuous soft tissue sleeve. The patella and was subluxed laterally and the patellofemoral ligament was incised. Inspection of the knee demonstrated severe degenerative changes in tricompartmental fashion. Prominent osteophytes were debrided using a rongeur. Anterior and posterior cruciate ligaments were excised. Two 4.0 mm Schanz pins were inserted into the femur and into the tibia for attachment of the array of trackers used for computer-assisted navigation. Hip center was identified using circumduction technique. Distal landmarks were mapped using the computer. The distal femur and proximal tibia were mapped using the computer. Distal femoral cutting guide was positioned using computer-assisted navigation, so as to achieve a 5 degree distal valgus cut. Cut was performed and verified using the computer. Distal femur was sized and it was felt that a size 4 femoral component was appropriate. A size 4 cutting guide was positioned and the anterior cut was performed and verified using the computer. This was followed by completion of the posterior and chamfer cuts. The femoral cutting guide for the central box was then positioned and the central box cut was performed. Attention was then directed to the proximal tibia. Medial and lateral menisci were excised. The extramedullary tibial cutting guide was positioned using computer-assisted navigation, so as to achieve 0 degree varus valgus alignment and 0 degree posterior slope. Cut was performed and verified using the computer. The proximal tibia was sized and it was felt to be a size 4 tibial tray was appropriate. Tibial and femoral trials were inserted followed by insertion of a 10 mm polyethylene  insert. The knee was felt to be tight  medially. A Cobb elevator was used to elevate the superficial fibers of the medial collateral ligament. This allowed for good medial and lateral soft tissue balancing, but and the knee was still tight in extension. Trial implants were removed and the extramedullary tibial cutting guide was repositioned and additional 2 mm of bone was resected. The trial components were reinserted with a 10 mm polyethylene trial. Excellent mediolateral soft tissue balancing was appreciated both in full extension and in flexion. Next, the patella was cut and prepared so as to accommodate a 41 mm three peg oval dome patella. Patellar trial was placed and the knee was placed through a range of motion with excellent patellar tracking appreciated.   The femoral trial was removed. Central post hole for the tibial component was reamed followed by insertion of a keel punch. Tibial trials were then removed. The cut surfaces of bone were irrigated with copious amounts of normal saline with antibiotic solution using pulsatile lavage and then suctioned dry. Polymethylmethacrylate cement was prepared in the usual fashion using a vacuum mixer. Cement was applied to the cut surface of the proximal tibia as well as along the undersurface of the size 4 MBT tibial component. The tibial component was positioned and impacted into place. Excess cement was removed using freer elevators. Cement was applied to the cut surface of the femur as well as along the posterior flanges of a size 4 posterior stabilized femoral component. Femoral component was positioned and impacted into place. Excess cement was removed using freer elevators. A 10 mm polyethylene trial was inserted and the knee was brought in full extension with steady axial compression applied. Finally, cement was applied to the backside of a 41 mm three peg oval dome patella and the patellar component was positioned and patellar clamp applied. Excess cement was removed using freer elevators.    After adequate curing of cement, the tourniquet was deflated, after total tourniquet time of 103 minutes. Hemostasis was achieved using electrocautery. The knee was irrigated with copious amounts of normal saline with antibiotic solution using pulsatile lavage and then suctioned dry. The knee was then inspected for any residual cement debris. 30 mL of 0.25% Marcaine with epinephrine was injected along the posterior capsule. A 10 mm stabilized rotating platform polyethylene insert was placed and the knee was placed through a range of motion. Excellent mediolateral soft tissue balancing was appreciated both in full extension and in flexion. Two medium drains were placed in the wound bed and brought out through a separate stab incision to be attached to a reinfusion system. The medial parapatellar portion of the incision was reapproximated using interrupted sutures of #1 Vicryl. The subcutaneous tissue was approximated in layers using first #0 Vicryl, followed by 2-0 Vicryl. Skin was closed with skin staples. A sterile dressing was applied. The patient tolerated the procedure well. He was transported to the recovery room in stable condition.     ____________________________ Laurice Record. Holley Bouche., MD jph:cc D: 11/08/2012 13:24:40 ET T: 11/08/2012 23:49:21 ET JOB#: [id number]  cc: Laurice Record. Holley Bouche., MD, <Dictator> Laurice Record Holley Bouche MD ELECTRONICALLY SIGNED 11/24/2012 19:42

## 2014-10-13 NOTE — Discharge Summary (Signed)
PATIENT NAME:  Wesley [name], Wesley [name] MR#:  [id number] DATE OF BIRTH:  1940-11-10  DATE OF ADMISSION:  11/08/2012 DATE OF DISCHARGE:  11/11/2012   DICTATING FOR: Jeneen Rinks P. Holley Bouche., MD  ADMITTING DIAGNOSIS: Degenerative arthrosis of left knee.   DISCHARGE DIAGNOSIS: Degenerative arthrosis of left knee.    HISTORY: The patient is a 74 year old who has been followed at [facility] for progression of left knee pain. He had reported a several-year history of progressive left knee pain. His left knee was noted to be more symptomatic than the right. The patient actually underwent left knee arthroscopy and had a partial medial and lateral meniscectomy performed in July 2013 per Dr. Gordy Levan. The patient was noted at that time to have significant degenerative changes. He did not see any significant improvement in his condition despite the use of glucosamine/chondroitin, intra-articular cortisone injections as well as Synvisc injections. He reported significant start-up stiffness as well as difficulty with stair ambulation. The patient localized most of the pain along the medial aspect of the knee. He was reporting some bilateral knee night pain. He had not seen any significant improvement in his condition despite the knee brace as well. At the time of surgery, he was not using any ambulatory aid. He did report some progressive loss in his range of motion. The patient states that his pain had progressed to the point that it was significantly interfering with his activities of daily living. X-rays taken in [employer] showed narrowing of the medial cartilage space with associated varus alignment. He was noted to have some subchondral sclerosis. After discussion of the risks and benefits of surgical intervention, the patient expressed his understanding of the risks and benefits and agreed for plans for surgical intervention.   PROCEDURE: Left total knee arthroplasty using computer-assisted  navigation.   ANESTHESIA: Femoral nerve block with spinal.   SOFT TISSUE RELEASE: Anterior cruciate ligament, posterior cruciate ligament, deep and superficial medial collateral ligaments as well as the patellofemoral ligament.   IMPLANTS UTILIZED: DePuy PFC size 4 posterior stabilized femoral component (cemented), size 4 MBT tibial component (cemented), 41 mm 3-pegged oval dome patella (cemented), and a 10 mm stabilized rotating platform polyethylene insert.   HOSPITAL COURSE: The patient tolerated the procedure very well. He had no complications. He was then taken to the PACU, where he was stabilized and then transferred to the orthopedic floor. He began receiving anticoagulation therapy of Lovenox 30 mg subcutaneous q.12 hours. He was also fitted with the AVI compression foot pumps bilaterally set at 80 mmHg to both lower extremities. He was fitted with TED stockings bilaterally. These were allowed to be removed 1 hour per 8-hour shift. The left one was applied on day 2 following removal of the Hemovac and dressing change. Calves have been nontender. There has been no evidence of any DVTs of the lower extremity. Heels were elevated off the bed using rolled towels. The patient was noted to always have the left knee externally rotated on entering the room while making rounds. We discussed this with the patient as far as trying to keep the foot straight up to help with the extension of the hamstrings.   The patient has denied any chest pains or shortness of breath. Vital signs have been stable. He has been afebrile. Hemodynamically, he was stable. No transfusions were given other than the Autovac transfusion given the first 6 hours postoperatively.   Physical therapy was initiated on day 1 for gait training and transfers. He  has done very well. Upon being discharged, he was ambulating greater than 200 feet. He was able go up and down 4 sets of steps. He was independent with bed to chair transfers.  Occupational therapy was also initiated on day 1 for ADLs and assistive devices. He has progressed very nicely. He has had no complications throughout the hospital course.   The patient's IV, Foley and Hemovac were discontinued on day 2 along with the dressing change. The wound was free of any drainage or signs of infection. The Polar Care was reapplied to the surgical leg, maintaining a temperature of 40 to 50 degrees Fahrenheit.   DISPOSITION: The patient is being discharged to home in improved stable condition.   DISCHARGE INSTRUCTIONS:  1. He may weight bear as tolerated. Continue to use a walker until cleared by physical therapy to go to a quad cane.  2. He will receive home health PT.  3. He is to continue with TED stockings bilaterally. These are to be worn during the day, but may be removed at night.  4. Continue Polar Care, maintaining a temperature of 40 to 50 degrees Fahrenheit. Recommend that he wear this around the clock for the first 2 weeks.  5. He was instructed in wound care. He is not to take a shower until the staples are removed in 2 weeks.  6. He is to call the clinic if he has any excessive bleeding or temperatures of 101.5 or greater.  7. He is placed on a regular diet.  8. He has a followup appointment on June 3rd at 9:15.   DISCHARGE MEDICATIONS: He is to resume his regular medications that he was on prior to admission. He was given a prescription for Roxicodone 5 to 10 mg q.4-6 hours p.r.n. for pain, Ultram 50 to 100 mg q.4-6 hours p.r.n. for pain and Lovenox 40 mg subcutaneously daily for 14 days, then discontinue and begin taking one 81 mg enteric-coated aspirin.   PAST MEDICAL HISTORY:  1. Hyperlipidemia.  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 4. Gastroesophageal reflux disease.  5. Hypertension.  6. Chronic low back pain.  7. Intermittent vertigo.  8. Sleep apnea.  9. Macular degeneration.  10. TIA in 2011.    ____________________________ Vance Peper, PA jrw:OSi D: 11/11/2012 07:26:35 ET T: 11/11/2012 07:37:30 ET JOB#: [id number]  cc: Vance Peper, PA, <Dictator> JON WOLFE PA ELECTRONICALLY SIGNED 11/27/2012 9:16

## 2014-10-17 NOTE — Telephone Encounter (Signed)
That is fine with me .Marland Kitchen He can switch to Dr. Ellyn Hack. Sounds like there was a scheduling problem.  Dr. Lemmie Evens

## 2014-11-21 ENCOUNTER — Other Ambulatory Visit: Payer: Self-pay | Admitting: Internal Medicine

## 2014-11-21 NOTE — Telephone Encounter (Signed)
Faxed Rx for tramadol, 50 mg, #90 with no refills to [facility] in [place], [place].

## 2015-01-04 ENCOUNTER — Other Ambulatory Visit: Payer: Self-pay | Admitting: Internal Medicine

## 2015-01-04 DIAGNOSIS — M5440 Lumbago with sciatica, unspecified side: Secondary | ICD-10-CM

## 2015-01-11 ENCOUNTER — Ambulatory Visit
Admission: RE | Admit: 2015-01-11 | Discharge: 2015-01-11 | Disposition: A | Discharge status: Home / Self Care | Payer: Medicare Other | Source: Ambulatory Visit | Attending: Internal Medicine | Admitting: Internal Medicine

## 2015-01-11 DIAGNOSIS — M5440 Lumbago with sciatica, unspecified side: Secondary | ICD-10-CM

## 2015-01-11 DIAGNOSIS — M5137 Other intervertebral disc degeneration, lumbosacral region: Secondary | ICD-10-CM | POA: Diagnosis not present

## 2015-01-11 DIAGNOSIS — M4806 Spinal stenosis, lumbar region: Secondary | ICD-10-CM | POA: Insufficient documentation

## 2015-01-11 DIAGNOSIS — M5136 Other intervertebral disc degeneration, lumbar region: Secondary | ICD-10-CM | POA: Diagnosis not present

## 2015-01-11 DIAGNOSIS — R531 Weakness: Secondary | ICD-10-CM | POA: Diagnosis present

## 2015-01-11 DIAGNOSIS — M545 Low back pain: Secondary | ICD-10-CM | POA: Diagnosis present

## 2015-02-12 ENCOUNTER — Other Ambulatory Visit: Payer: Self-pay | Admitting: Internal Medicine

## 2015-04-10 ENCOUNTER — Telehealth: Payer: Self-pay

## 2015-04-10 NOTE — Telephone Encounter (Signed)
Received rx refill for Tramadol 50mg . Called pt so he could choose a new physician. Left vm.

## 2015-04-19 NOTE — Telephone Encounter (Signed)
Left vm for pt to callback 

## 2015-08-15 ENCOUNTER — Telehealth: Payer: Self-pay | Admitting: Internal Medicine

## 2015-08-15 NOTE — Telephone Encounter (Signed)
New Message    Pt wants to speak to a RN about being released from care so he can see if he can see another cardiologist in our group.  Pt use to be a pt of hilty

## 2015-10-22 DIAGNOSIS — C61 Malignant neoplasm of prostate: Secondary | ICD-10-CM

## 2015-10-22 HISTORY — DX: Malignant neoplasm of prostate: C61

## 2015-10-25 ENCOUNTER — Other Ambulatory Visit (HOSPITAL_COMMUNITY): Payer: Self-pay | Admitting: Urology

## 2015-10-25 DIAGNOSIS — C61 Malignant neoplasm of prostate: Secondary | ICD-10-CM

## 2015-11-02 ENCOUNTER — Encounter (HOSPITAL_COMMUNITY)
Admission: RE | Admit: 2015-11-02 | Discharge: 2015-11-02 | Disposition: A | Discharge status: Home / Self Care | Payer: Medicare Other | Source: Ambulatory Visit | Attending: Urology | Admitting: Urology

## 2015-11-02 DIAGNOSIS — C61 Malignant neoplasm of prostate: Secondary | ICD-10-CM | POA: Diagnosis not present

## 2015-11-02 MED ORDER — TECHNETIUM TC 99M MEDRONATE IV KIT
25.0000 | PACK | Freq: Once | INTRAVENOUS | Status: AC | PRN
  Administered 2015-11-02: 26.6 via INTRAVENOUS

## 2015-11-20 ENCOUNTER — Ambulatory Visit
Admission: RE | Admit: 2015-11-20 | Discharge: 2015-11-20 | Disposition: A | Discharge status: Home / Self Care | Payer: Medicare Other | Source: Ambulatory Visit | Attending: Radiation Oncology | Admitting: Radiation Oncology

## 2015-11-20 ENCOUNTER — Encounter: Payer: Self-pay | Admitting: Radiation Oncology

## 2015-11-20 VITALS — BP 131/71 | HR 75 | Resp 16 | Ht 69.0 in | Wt 190.8 lb

## 2015-11-20 DIAGNOSIS — C61 Malignant neoplasm of prostate: Secondary | ICD-10-CM | POA: Insufficient documentation

## 2015-11-20 HISTORY — DX: Malignant neoplasm of prostate: C61

## 2015-11-20 NOTE — Progress Notes (Signed)
Radiation Oncology         [phone] ________________________________  Initial Outpatient Consultation  Name: Wesley [name] MRN: [id number]  Date: 11/20/2015  DOB: 05-11-1941  [id number] J Arther Dames, MD  Kathie Rhodes, MD   REFERRING PHYSICIAN: Kathie Rhodes, MD  DIAGNOSIS: 75 y.o. gentleman with stage T1c adenocarcinoma of the prostate with a Gleason's score of 4+4 and a PSA of 13.76    ICD-9-CM ICD-10-CM   1. Malignant neoplasm of prostate ([place]) [place] Wesley [name] Wesley [name] is a 75 y.o. gentleman. He has had a long history of an elevated PSA and has been following Dr. Karsten Ro since 2011 with PSA's between 4 and 5. 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 He had some symptoms of BPH as well and was using Avodart and Flomax. Apparently his medications were not refilled when switching to another pharmacy and he was without for about 4 months. During this time his PSA continued to be persistently elevated in April 2017 when it was 13.76. Digital rectal examination was performed revealing no nodules.  The patient proceeded to transrectal ultrasound with 12 biopsies of the prostate on 10/18/2015. The prostate volume measured 128 cc. Out of 12 core biopsies, 3 were positive. The maximum Gleason score was 4+4 and 3+3 in two of the cores, and this is shown in the distribution below. He had a CT scan and bone scan 11/02/2015 that was negative for metastatic disease. He began Lupron on 11/08/2015, and comes today to discuss the role of radiation treatment with Dr. Tammi Klippel.     PREVIOUS RADIATION THERAPY: No  PAST MEDICAL HISTORY: Past Medical History  Diagnosis Date  . Coronary atherosclerosis of autologous vein bypass graft   . Synovitis and tenosynovitis, unspecified   . Unspecified vitamin D deficiency   . Hyperlipidemia   . Essential hypertension, benign     pt denies 04/07/14   . Chronic back pain   . Intermittent vertigo   . GERD (gastroesophageal reflux disease)   . Allergic rhinitis   . Labral tear of shoulder     w/ fracture  . Macular degeneration   . Stroke ([place])   . S/P CABG x 4 2005  . OSA (obstructive sleep apnea)     sleep study 06/03/2012 - AHI 54.74/hr during total sleep, AHI 49.66/hr during REM  . Tubular adenoma of colon   . Prostate cancer [facility])      PAST SURGICAL HISTORY: Past Surgical History  Procedure Laterality Date  . 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 Cataract extraction, bilateral    . Hemorrhoid surgery  2002  . Upper gastrointestinal endoscopy    . Tonsillectomy and adenoidectomy    . Colonoscopy    . Total knee arthroplasty Left     Dr. Marry Guan [facility], [place])  . Transthoracic echocardiogram  06/2012    EF 50-55%, mild MR; LA mild-mod dilated; RVSP increased  . Nm myocar perf wall motion  06/2012    lexiscan myoview - normal low risk study, EF 47%  . Cardiac catheterization  02/01/2004    L main with 60% prox stenosis, LAD calcified in prox section with 95% ostial lesion, diffusion 50-60% disease in mid LAD with focal 90% mid LAD stenosis; 1st diagonal coarsely irregular with up to 30% stenosis in prox &  mid segments; L Cfx with 3 OMs, AV groove Cfx with 40-50% stenosis; 3rd OM with 50% prox stenosis; RCA with PDA & PLA, PLA with 95% osital lesion (Dr. Gerrie Nordmann)  . Coronary artery bypass graft  02/02/2004    LIMA to DX1, RIMA to RCA, SVG to distal LAD, SVG to Cfx marginal (Dr. Remus Loffler)    FAMILY HISTORY:  Family History  Problem Relation Age of Onset  . Heart disease Mother   . Parkinsonism Father   . Diabetes Paternal Grandfather   . Stomach cancer Cousin     SOCIAL HISTORY:  reports that he has never smoked. He has never used smokeless tobacco. He reports that he drinks alcohol. He reports that he does not use illicit drugs. He is in a long term relationship  with his girlfriend Wesley [name] who accompanies him. 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   ALLERGIES: Amoxicillin and Other  MEDICATIONS:  Current Outpatient Prescriptions  Medication Sig Dispense Refill  . Ascorbic Acid (VITAMIN C) 1000 MG tablet Take 1,000 mg by mouth daily.     Marland Kitchen aspirin 81 MG EC tablet Take 81 mg by mouth 2 (two) times daily.     Marland Kitchen aspirin-acetaminophen-caffeine (EXCEDRIN MIGRAINE) 250-250-65 MG per tablet Take 1 tablet by mouth every 6 (six) hours as needed.      Marland Kitchen atorvastatin (LIPITOR) 80 MG tablet Take 80 mg by mouth daily.      . Calcium Carbonate-Vitamin D (CALCIUM PLUS VITAMIN D PO) Take 600 mg by mouth daily.     . Cholecalciferol (VITAMIN D-3) 1000 UNITS CAPS Take 2,000 Units by mouth daily.    . Coenzyme Q10 50 MG CAPS Take 200 mg by mouth daily.     Marland Kitchen CRANBERRY PO Take 84 mg by mouth daily.    . cyanocobalamin 2000 MCG tablet Take 1,000 mcg by mouth daily.     Marland Kitchen GARLIC PO Take [id number] mg by mouth daily.    Marland Kitchen glucosamine-chondroitin 500-400 MG tablet Take 1 tablet by mouth 3 (three) times daily.      . Ibuprofen-Diphenhydramine Cit (ADVIL PM PO) Take by mouth as needed.    . Loratadine (CLARITIN PO) Take by mouth daily as needed.    Marland Kitchen LUMIGAN 0.01 % SOLN Place 1 drop into both eyes daily.    . magnesium oxide (MAG-OX) 400 MG tablet Take 400 mg by mouth daily.    . meloxicam (MOBIC) 15 MG tablet TK 1 T PO QD  2  . Methylsulfonylmethane (MSM PO) Take 1 capsule by mouth daily.      . metoprolol succinate (TOPROL-XL) 25 MG 24 hr tablet Take 50 mg by mouth daily.     . Multiple Vitamin (MULTIVITAMIN) tablet Take 1 tablet by mouth daily.      Marland Kitchen NEXIUM 40 MG capsule TAKE 1 CAPSULE (40 MG TOTAL) BY MOUTH DAILY AT 12 NOON. 30 capsule 2  . Omega-3 Fatty Acids (FISH OIL PO) Take 400 mg by mouth daily. Fish, flax, borage oil with omega    . pantoprazole (PROTONIX) 40 MG tablet TK 1 T PO ONCE A DAY.  7  . tamsulosin  (FLOMAX) 0.4 MG CAPS capsule Take 0.4 mg by mouth daily.    . traMADol (ULTRAM) 50 MG tablet TAKE 1 TABLET BY MOUTH EVERY 4 TO 6 HOURS AS NEEDED 90 tablet 0  . zinc gluconate 50 MG tablet Take 100 mg by mouth daily.     Marland Kitchen  Zn-Pyg Afri-Nettle-Saw Palmet (SAW PALMETTO COMPLEX PO) Take 1 capsule by mouth daily.       No current facility-administered medications for this encounter.    REVIEW OF SYSTEMS:  On review of systems, the patient reports that he is doing well overall. He denies any chest pain, shortness of breath, cough, fevers, chills, night sweats, unintended weight changes. He denies any bowel disturbances, and denies abdominal pain, nausea or vomiting. He denies any new musculoskeletal or joint aches or pains. He mentions he has nocturia x 3-4 reduced to no nocturia with avodart. Urgency and slow stream improved with tamsulosin. Denies dysuria, hematuria, and leakage.The patient completed an IPSS and IIEF questionnaire.  His IPSS score was 4 indicating mild urinary outflow obstructive symptoms.  He indicated that his erectile function is able to complete sexual activity almost half the time. A complete review of systems is obtained and is otherwise negative.    PHYSICAL EXAM:   height is 5\' 9"  (1.753 m) and weight is 190 lb 12.8 oz (86.546 kg). His respiration is 16 and oxygen saturation is 100%.    In general this is a well appearing caucasian male in no acute distress. He is alert and oriented x4 and appropriate throughout the examination. HEENT reveals that the patient is normocephalic, atraumatic. EOMs are intact. PERRLA. Skin is intact without any evidence of gross lesions. Cardiovascular exam reveals a regular rate and rhythm, no clicks rubs or murmurs are auscultated. Chest is clear to auscultation bilaterally. Lymphatic assessment is performed and does not reveal any adenopathy in the cervical, supraclavicular, axillary, or inguinal chains. Abdomen has active bowel sounds in all quadrants  and is intact. The abdomen is soft, non tender, non distended. Lower extremities are negative for pretibial pitting edema, deep calf tenderness, cyanosis or clubbing.  KPS = 100  100 - Normal; no complaints; no evidence of disease. 90   - Able to carry on normal activity; minor signs or symptoms of disease. 80   - Normal activity with effort; some signs or symptoms of disease. 85   - Cares for self; unable to carry on normal activity or to do active work. 60   - Requires occasional assistance, but is able to care for most of his personal needs. 50   - Requires considerable assistance and frequent medical care. 80   - Disabled; requires special care and assistance. 50   - Severely disabled; hospital admission is indicated although death not imminent. 74   - Very sick; hospital admission necessary; active supportive treatment necessary. 10   - Moribund; fatal processes progressing rapidly. 0     - Dead  Karnofsky DA, Abelmann [place], Craver LS and Burchenal JH [phone]) The use of the nitrogen mustards in the palliative treatment of carcinoma: with particular reference to bronchogenic carcinoma Cancer 1 [phone]   LABORATORY DATA:  Lab Results  Component Value Date   WBC 10.1 04/07/2014   HGB 15.0 04/07/2014   HCT 46.1 04/07/2014   MCV 89.8 04/07/2014   PLT 276.0 04/07/2014   Lab Results  Component Value Date   NA 141 11/10/2012   K 3.9 11/10/2012   CL 109* 11/10/2012   CO2 28 11/10/2012   Lab Results  Component Value Date   ALT 40 10/16/2009   AST 41* 10/16/2009   ALKPHOS 43 10/16/2009   BILITOT 1.0 10/16/2009     RADIOGRAPHY: Nm Bone Scan Whole Body  11/02/2015  CLINICAL DATA:  Prostate carcinoma EXAM: NUCLEAR MEDICINE WHOLE BODY BONE  SCAN TECHNIQUE: Whole body anterior and posterior images were obtained approximately 3 hours after intravenous injection of radiopharmaceutical. RADIOPHARMACEUTICALS:  26.6 mCi Technetium-48m MDP IV COMPARISON:  None. FINDINGS: There is adequate  uptake of radioactive tracer throughout the bony skeleton. Bilateral renal activity is noted. There are degenerative changes noted in the right knee joint. A left knee replacement is seen. Increased activity is noted in the shoulder joints hand sternoclavicular joints in a symmetrical fashion consistent with degenerative change. Degenerative change in the left wrist is noted as well. No focus of increased activity to suggest bony metastatic disease is noted. IMPRESSION: No evidence of metastatic disease. Diffuse degenerative changes are noted. Electronically Signed   By: Inez Catalina M.D.   On: 11/02/2015 14:27    IMPRESSION: This gentleman is a 75 yo with adenocarcinoma of the prostate.  His T-Stage, Gleason's Score, and PSA put him into the high risk group.  Accordingly he is eligible for a variety of potential treatment options including external beam radiotherapy over 8 weeks time, versus 5 weeks of external beam radiotherapy with seed boost.   PLAN: Dr. Tammi Klippel again reviewed the findings and workup thus far.  We discussed the natural history of prostate cancer.  We reviewed the the implications of T-stage, Gleason's Score, and PSA on decision-making and outcomes in prostate cancer.  We discussed radiation treatment in the management of prostate cancer with regard to the logistics and delivery of external beam radiation treatment as well as the logistics and delivery of prostate brachytherapy as a boost to 5 weeks of external radiotherapy if he became a candidate for this.  We compared and contrasted each of these approaches. Dr. Tammi Klippel discusses the option for seed boost would be determined after repeat ultrasound to evaluate the gland size by Dr. Karsten Ro. It would be best to consider re-imaging the prostate by ultrasound, at least 4 months after his Lupron which was given on 11/08/15. Dr. Sharrie Rothman also discusses the rationale for fiducial marker placement regardless of which option the patient chooses,  and outlines that if the patient wanted to move forward with external radiotherapy alone, we could move forward with this in July.   After consideration, the patient is interested in external radiotherapy with seed implant boost if he becomes eligible for this. He will follow up with Dr. Karsten Ro in about 4 months to have his prostate re-imaged to see if he can move forward as above. We will plan to see him after that to review the recommendations as outlined and as it pertains to the findings at that time.  The above documentation reflects my direct findings during this shared patient visit. Please see the separate note by Dr. Tammi Klippel on this date for the remainder of the patient's plan of care.    Carola Rhine, PAC   This document serves as a record of services personally performed by Shona Simpson, PAC and Tyler Pita, MD. It was created on their behalf by Lendon Collar, a trained medical scribe. The creation of this record is based on the scribe's personal observations and the provider's statements to them. This document has been checked and approved by the attending provider.

## 2015-11-20 NOTE — Progress Notes (Signed)
GU Location of Tumor / Histology: prostatic adenocarcinoma  If Prostate Cancer, Gleason Score is (4 + 4) and PSA is (13.76) on 10/08/15  Wesley [name] has had a long history of an elevated PSA. He underwent prostate biopsy three times in [place] with no prostate cancer being detected.  Biopsies of prostate (if applicable) revealed:    Past/Anticipated interventions by urology, if any: prostate biopsy x 4, Lupron 45 mg on 11/08/15, referral to Dr. Tammi Klippel  Past/Anticipated interventions by medical oncology, if any: no  Weight changes, if any: no  Bowel/Bladder complaints, if any: Nocturia resolved with avodart. Reports occasional urgency. Reports slow stream improved with tamsulosin. Denies dysuria or hematuria. Denies nocturia or incontinence. Denies hot flashes since receiving lupron injection.   Nausea/Vomiting, if any: no  Pain issues, if any:  no  SAFETY ISSUES:  Prior radiation? No. Hx of cancer in right lower jaw muscle that was surgically removed. Denies having chemo or radiation for the management of this cancer.  Pacemaker/ICD? no  Possible current pregnancy? no  Is the patient on methotrexate? 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 Ax: amoxicillin. Hx of colon cancer. No evidence of extension locally outside of the prostate and pelvic adenopathy. Prostate volume: 128 cc. Per [place] patient will require ADT for two years.

## 2015-11-20 NOTE — Progress Notes (Signed)
See progress note under physician encounter. 

## 2015-11-21 ENCOUNTER — Telehealth: Payer: Self-pay | Admitting: *Deleted

## 2015-11-21 NOTE — Telephone Encounter (Signed)
CALLED PATIENT TO INFORM OF 4 MONTH FU WITH DR. Karsten Ro ON 02-28-16 - ARRIVAL TIME - 12:45 PM FOR REULTRASOUND AND PLACEMENT OF 3 GOLD SEEDS AND HIS [facility]NC VISIT WITH DR. MANNING ON 03-06-16 - 12:30 PM - NURSE AND 1 PM - VISIT WITH DR. MANNING, SPOKE WITH PATIENT AND HE IS AWARE OF THESE APPTS.

## 2015-11-26 ENCOUNTER — Ambulatory Visit: Payer: Medicare Other | Admitting: Radiation Oncology

## 2015-11-26 ENCOUNTER — Ambulatory Visit: Payer: Medicare Other

## 2016-01-21 ENCOUNTER — Encounter: Payer: Self-pay | Admitting: Gastroenterology

## 2016-02-21 ENCOUNTER — Other Ambulatory Visit: Payer: Self-pay | Admitting: Radiation Oncology

## 2016-02-21 DIAGNOSIS — D72820 Lymphocytosis (symptomatic): Secondary | ICD-10-CM | POA: Insufficient documentation

## 2016-02-28 HISTORY — PX: OTHER SURGICAL HISTORY: SHX169

## 2016-02-29 NOTE — Telephone Encounter (Signed)
Please close Encounter °

## 2016-03-05 ENCOUNTER — Encounter: Payer: Self-pay | Admitting: Radiation Oncology

## 2016-03-05 NOTE — Progress Notes (Signed)
GU Location of Tumor / Histology: prostatic adenocarcinoma  If Prostate Cancer, Gleason Score is (4 + 4) and PSA is (13.76) on 10/08/15  Wesley [name] has had a long history of an elevated PSA. He underwent prostate biopsy three times in [place] with no prostate cancer being detected.  Biopsies of prostate (if applicable) revealed:    Past/Anticipated interventions by urology, if any: prostate biopsy x 4, last Lupron 45 mg administered on 11/08/15, referral to Dr. Tammi Klippel, patient reports that he understands his next Lupron injection should be in November but, no appt has been set.  Past/Anticipated interventions by medical oncology, if any: no  Weight changes, if any: no  Bowel/Bladder complaints, if any: Nocturia resolved with avodart. Reports occasional urgency. Reports slow stream improved with tamsulosin. Denies dysuria or hematuria. Denies nocturia or incontinence. Reports he is having hot flashes.   Nausea/Vomiting, if any: no  Pain issues, if any:  no  SAFETY ISSUES:  Prior radiation? No. Hx of cancer in right lower jaw muscle that was surgically removed. Denies having chemo or radiation for the management of this cancer.  Pacemaker/ICD? no  Possible current pregnancy? no  Is the patient on methotrexate? 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 Ax: amoxicillin. Hx of colon cancer. No evidence of extension locally outside of the prostate and pelvic adenopathy. Prostate volume: dropped from 128 cc. To 90.23 cc with ADT. Patient had gold markers placed on 02/28/16. Patient returns to discuss next steps of treatment (external beam without boost).

## 2016-03-06 ENCOUNTER — Ambulatory Visit
Admission: RE | Admit: 2016-03-06 | Discharge: 2016-03-06 | Disposition: A | Discharge status: Home / Self Care | Payer: Medicare Other | Source: Ambulatory Visit | Attending: Radiation Oncology | Admitting: Radiation Oncology

## 2016-03-06 ENCOUNTER — Encounter: Payer: Self-pay | Admitting: Radiation Oncology

## 2016-03-06 VITALS — BP 133/84 | HR 68 | Resp 16 | Wt 191.2 lb

## 2016-03-06 DIAGNOSIS — C61 Malignant neoplasm of prostate: Secondary | ICD-10-CM | POA: Insufficient documentation

## 2016-03-06 NOTE — Progress Notes (Signed)
See progress note under physician encounter. 

## 2016-03-06 NOTE — Progress Notes (Signed)
  Radiation Oncology         [phone]) [phone] ________________________________  Name: Wesley [name] MRN: [id number]  Date: 03/06/2016  DOB: 11-18-1940  SIMULATION AND TREATMENT PLANNING NOTE    ICD-9-CM ICD-10-CM   1. Malignant neoplasm of prostate ([place]) 185 C61     DIAGNOSIS:  75 y.o.gentleman with stage T1c adenocarcinoma of the prostate with a Gleason's score of 4+4 and a PSA of 13.76  NARRATIVE:  The patient was brought to the [facility].  Identity was confirmed.  All relevant records and images related to the planned course of therapy were reviewed.  The patient freely provided informed written consent to proceed with treatment after reviewing the details related to the planned course of therapy. The consent form was witnessed and verified by the simulation staff.  Then, the patient was set-up in a stable reproducible supine position for radiation therapy.  A vacuum lock pillow device was custom fabricated to position his legs in a reproducible immobilized position.  Then, I performed a urethrogram under sterile conditions to identify the prostatic apex.  CT images were obtained.  Surface markings were placed.  The CT images were loaded into the planning software.  Then the prostate target and avoidance structures including the rectum, bladder, bowel and hips were contoured.  Treatment planning then occurred.  The radiation prescription was entered and confirmed.  A total of one complex treatment devices were fabricated. I have requested : Intensity Modulated Radiotherapy (IMRT) is medically necessary for this case for the following reason:  Rectal sparing.Marland Kitchen  PLAN:  The patient will receive 75 Gy in 40 fractions with pelvic lymph nodes to 45 Gy and prostate boost after.  ________________________________  Sheral Apley. Tammi Klippel, M.D.  This document serves as a record of services personally performed by Tyler Pita, MD. It was created on his behalf by Arlyce Harman,  a trained medical scribe. The creation of this record is based on the scribe's personal observations and the provider's statements to them. This document has been checked and approved by the attending provider.

## 2016-03-06 NOTE — Progress Notes (Signed)
Radiation Oncology         [phone]) [phone] ________________________________  Name: Wesley [name] MRN: [id number]  Date: 03/06/2016  DOB: 1940/11/12  Follow-Up Visit Note  CC: BERT Odetta Pink, MD  Kathie Rhodes, MD  Diagnosis:   75 y.o. gentleman with stage T1c adenocarcinoma of the prostate with a Gleason's score of 4+4 and a PSA of 13.76    ICD-9-CM ICD-10-CM   1. Malignant neoplasm of prostate ([place]) [place]      Narrative:  The patient returns today to discuss the next steps of treatment (external beam without boost).  Since our last visit, the patient's prostate volume has dropped from 128 cc to 90.23 cc with ADT and the patient had gold markers placed on 02/28/2016. His last Lupron 45 mg was administered on 11/08/2015. Patient reports that he understands his next Lupron injection should be in November but, no appointment has been set.                             On review of systems, the patient reports that he is doing well overall. He denies any chest pain, shortness of breath, cough, fevers, chills, unintended weight changes. He denies any bowel disturbances, and denies abdominal pain, nausea or vomiting. He reports nocturia resolved with avodart. He reports occasional urgency, slow stream improved with tamsulosin. He denies dysuria, hematuria, nocturia or incontinence. He reports having hot flashes. He denies any new musculoskeletal or joint aches or pains. A complete review of systems is obtained and is otherwise negative.     ALLERGIES:  is allergic to amoxicillin and other.  Meds: Current Outpatient Prescriptions  Medication Sig Dispense Refill  . Ascorbic Acid (VITAMIN C) 1000 MG tablet Take 1,000 mg by mouth daily.     Marland Kitchen aspirin 81 MG EC tablet Take 81 mg by mouth 2 (two) times daily.     Marland Kitchen aspirin-acetaminophen-caffeine (EXCEDRIN MIGRAINE) 250-250-65 MG per tablet Take 1 tablet by mouth every 6 (six) hours as needed.      Marland Kitchen atorvastatin (LIPITOR) 80 MG tablet Take 80  mg by mouth daily.      . Calcium Carbonate-Vitamin D (CALCIUM PLUS VITAMIN D PO) Take 600 mg by mouth daily.     . calcium elemental as carbonate (BARIATRIC TUMS ULTRA) 400 MG chewable tablet Chew by mouth.    . cetirizine (ZYRTEC) 10 MG tablet Take by mouth.    . Cholecalciferol (VITAMIN D-3) 1000 UNITS CAPS Take 2,000 Units by mouth daily.    . Coenzyme Q10 50 MG CAPS Take 200 mg by mouth daily.     Marland Kitchen CRANBERRY PO Take 84 mg by mouth daily.    . cyanocobalamin 2000 MCG tablet Take 1,000 mcg by mouth daily.     . fluticasone (FLONASE) 50 MCG/ACT nasal spray Place into the nose.    Marland Kitchen GARLIC PO Take [id number] mg by mouth daily.    . Glucosamine Sulfate 1000 MG CAPS Take by mouth.    Marland Kitchen glucosamine-chondroitin 500-400 MG tablet Take 1 tablet by mouth 3 (three) times daily.      . Ibuprofen-Diphenhydramine Cit (ADVIL PM PO) Take by mouth as needed.    . Loratadine (CLARITIN PO) Take by mouth daily as needed.    Marland Kitchen LUMIGAN 0.01 % SOLN Place 1 drop into both eyes daily.    . magnesium oxide (MAG-OX) 400 MG tablet Take 400 mg by mouth daily.    Marland Kitchen  meloxicam (MOBIC) 15 MG tablet TK 1 T PO QD  2  . Methylsulfonylmethane (MSM PO) Take 1 capsule by mouth daily.      . Multiple Vitamin (MULTIVITAMIN) tablet Take 1 tablet by mouth daily.      Marland Kitchen NEXIUM 40 MG capsule TAKE 1 CAPSULE (40 MG TOTAL) BY MOUTH DAILY AT 12 NOON. 30 capsule 2  . Omega-3 Fatty Acids (FISH OIL PO) Take 400 mg by mouth daily. Fish, flax, borage oil with omega    . pantoprazole (PROTONIX) 40 MG tablet TK 1 T PO ONCE A DAY.  7  . tamsulosin (FLOMAX) 0.4 MG CAPS capsule Take 0.4 mg by mouth daily.    . traMADol (ULTRAM) 50 MG tablet TAKE 1 TABLET BY MOUTH EVERY 4 TO 6 HOURS AS NEEDED 90 tablet 0  . zinc gluconate 50 MG tablet Take 100 mg by mouth daily.     Marland Kitchen Zn-Pyg Afri-Nettle-Saw Palmet (SAW PALMETTO COMPLEX PO) Take 1 capsule by mouth daily.       No current facility-administered medications for this encounter.     Physical  Findings:  weight is 191 lb 3.2 oz (86.7 kg). His blood pressure is 133/84 and his pulse is 68. His respiration is 16 and oxygen saturation is 100%.    In general this is a well appearing Caucasian male in no acute distress. He's alert and oriented x4 and appropriate throughout the examination. Cardiopulmonary assessment is negative for acute distress and he exhibits normal effort.    Lab Findings: Lab Results  Component Value Date   WBC 10.1 04/07/2014   HGB 15.0 04/07/2014   HGB 12.0 (L) 11/10/2012   HCT 46.1 04/07/2014   HCT 42.1 10/25/2012   PLT 276.0 04/07/2014   PLT 172 11/10/2012    Lab Results  Component Value Date   NA 141 11/10/2012   K 3.9 11/10/2012   CO2 28 11/10/2012   GLUCOSE 89 11/10/2012   BUN 13 11/10/2012   CREATININE 0.66 11/10/2012   BILITOT 1.0 10/16/2009   ALKPHOS 43 10/16/2009   AST 41 (H) 10/16/2009   ALT 40 10/16/2009   PROT 6.4 10/16/2009   ALBUMIN 4.1 10/16/2009   CALCIUM 8.7 11/10/2012   ANIONGAP 4 (L) 11/10/2012    Radiographic Findings: No results found.  Impression:  The patient is not an ideal candidate for seed implant due to prostate volume. He will proceed with prostate IMRT and has been scheduled for CT simulation later today.   Plan:  He will proceed with CT simulation later today and will begin treatment sometime next week.   _____________________________________  Sheral Apley. Tammi Klippel, M.D.  This document serves as a record of services personally performed by Tyler Pita, MD and Shona Simpson, PAC. It was created on his behalf by Arlyce Harman, a trained medical scribe. The creation of this record is based on the scribe's personal observations and the provider's statements to them. This document has been checked and approved by the attending provider.

## 2016-03-10 NOTE — Progress Notes (Deleted)
[facility]  Telephone:(336) [phone] Fax:(336) [phone]  ID: Wesley [name] OB: 1941-04-27  MR#: [id number]  [id number]  Patient Care Team: [name], MD as PCP - General  CHIEF COMPLAINT: Lymphocytosis.  INTERVAL HISTORY: ***  REVIEW OF SYSTEMS:   ROS  As per HPI. Otherwise, a complete review of systems is negatve.  PAST MEDICAL HISTORY: Past Medical History:  Diagnosis Date  . Allergic rhinitis   . Chronic back pain   . Coronary atherosclerosis of autologous vein bypass graft   . Essential hypertension, benign    pt denies 04/07/14  . GERD (gastroesophageal reflux disease)   . Hyperlipidemia   . Intermittent vertigo   . Labral tear of shoulder    w/ fracture  . Macular degeneration   . OSA (obstructive sleep apnea)    sleep study 06/03/2012 - AHI 54.74/hr during total sleep, AHI 49.66/hr during REM  . Prostate cancer ([place])   . S/P CABG x 4 2005  . Stroke ([place])   . Synovitis and tenosynovitis, unspecified   . Tubular adenoma of colon   . Unspecified vitamin D deficiency     PAST SURGICAL HISTORY: Past Surgical History:  Procedure Laterality Date  . CARDIAC CATHETERIZATION  02/01/2004   L main with 60% prox stenosis, LAD calcified in prox section with 95% ostial lesion, diffusion 50-60% disease in mid LAD with focal 90% mid LAD stenosis; 1st diagonal coarsely irregular with up to 30% stenosis in prox & mid segments; L Cfx with 3 OMs, AV groove Cfx with 40-50% stenosis; 3rd OM with 50% prox stenosis; RCA with PDA & PLA, PLA with 95% osital lesion (Dr. Gerrie Nordmann)  . CATARACT EXTRACTION, BILATERAL    . COLONOSCOPY    . CORONARY ARTERY BYPASS GRAFT  02/02/2004   LIMA to DX1, RIMA to RCA, SVG to distal LAD, SVG to Cfx marginal (Dr. Remus Loffler)  . gold seed marker placement/prostate  02/28/2016  . [place]  2002  . NM MYOCAR PERF WALL MOTION  06/2012   lexiscan myoview - normal low risk study, EF 47%  . 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 TONSILLECTOMY AND ADENOIDECTOMY    . TOTAL KNEE ARTHROPLASTY Left    Dr. Marry Gu[facility] [place])  . TRANSTHORACIC ECHOCARDIOGRAM  06/2012   EF 50-55%, mild MR; LA mild-mod dilated; RVSP increased  . UPPER GASTROINTESTINAL ENDOSCOPY      FAMILY HISTORY: Family History  Problem Relation Age of Onset  . Heart disease Mother   . Parkinsonism Father   . Diabetes Paternal Grandfather   . Stomach cancer Cousin     ADVANCED DIRECTIVES (Y/N):  N  HEALTH MAINTENANCE: Social History  Substance Use Topics  . Smoking status: Never Smoker  . Smokeless tobacco: Never Used  . Alcohol use Yes     Comment: occasional beer every 3-4 week     Colonoscopy:  PAP:  Bone density:  Lipid panel:  Allergies  Allergen Reactions  . Amoxicillin     May have caused congestion  . Other Hives    Uncoded Allergy. Allergen: amoxcillin    Current Outpatient Prescriptions  Medication Sig Dispense Refill  . Ascorbic Acid (VITAMIN C) 1000 MG tablet Take 1,000 mg by mouth daily.     Marland Kitchen aspirin 81 MG EC tablet Take 81 mg by mouth 2 (two) times daily.     Marland Kitchen aspirin-acetaminophen-caffeine (EXCEDRIN MIGRAINE) 250-250-65 MG per  tablet Take 1 tablet by mouth every 6 (six) hours as needed.      Marland Kitchen atorvastatin (LIPITOR) 80 MG tablet Take 80 mg by mouth daily.      . Calcium Carbonate-Vitamin D (CALCIUM PLUS VITAMIN D PO) Take 600 mg by mouth daily.     . calcium elemental as carbonate (BARIATRIC TUMS ULTRA) 400 MG chewable tablet Chew by mouth.    . cetirizine (ZYRTEC) 10 MG tablet Take by mouth.    . Cholecalciferol (VITAMIN D-3) 1000 UNITS CAPS Take 2,000 Units by mouth daily.    . Coenzyme Q10 50 MG CAPS Take 200 mg by mouth daily.     Marland Kitchen CRANBERRY PO Take 84 mg by mouth daily.    . cyanocobalamin 2000 MCG tablet Take 1,000 mcg by mouth daily.     . fluticasone (FLONASE) 50 MCG/ACT nasal spray Place into the nose.    Marland Kitchen GARLIC PO Take  [id number] mg by mouth daily.    . Glucosamine Sulfate 1000 MG CAPS Take by mouth.    Marland Kitchen glucosamine-chondroitin 500-400 MG tablet Take 1 tablet by mouth 3 (three) times daily.      . Ibuprofen-Diphenhydramine Cit (ADVIL PM PO) Take by mouth as needed.    . Loratadine (CLARITIN PO) Take by mouth daily as needed.    Marland Kitchen LUMIGAN 0.01 % SOLN Place 1 drop into both eyes daily.    . magnesium oxide (MAG-OX) 400 MG tablet Take 400 mg by mouth daily.    . meloxicam (MOBIC) 15 MG tablet TK 1 T PO QD  2  . Methylsulfonylmethane (MSM PO) Take 1 capsule by mouth daily.      . Multiple Vitamin (MULTIVITAMIN) tablet Take 1 tablet by mouth daily.      Marland Kitchen NEXIUM 40 MG capsule TAKE 1 CAPSULE (40 MG TOTAL) BY MOUTH DAILY AT 12 NOON. 30 capsule 2  . Omega-3 Fatty Acids (FISH OIL PO) Take 400 mg by mouth daily. Fish, flax, borage oil with omega    . pantoprazole (PROTONIX) 40 MG tablet TK 1 T PO ONCE A DAY.  7  . tamsulosin (FLOMAX) 0.4 MG CAPS capsule Take 0.4 mg by mouth daily.    . traMADol (ULTRAM) 50 MG tablet TAKE 1 TABLET BY MOUTH EVERY 4 TO 6 HOURS AS NEEDED 90 tablet 0  . zinc gluconate 50 MG tablet Take 100 mg by mouth daily.     Marland Kitchen Zn-Pyg Afri-Nettle-Saw Palmet (SAW PALMETTO COMPLEX PO) Take 1 capsule by mouth daily.       No current facility-administered medications for this visit.     OBJECTIVE: There were no vitals filed for this visit.   There is no height or weight on file to calculate BMI.    ECOG FS:{CHL ONC [id number]  General: Well-developed, well-nourished, no acute distress. Eyes: Pink conjunctiva, anicteric sclera. HEENT: Normocephalic, moist mucous membranes, clear oropharnyx. Lungs: Clear to auscultation bilaterally. Heart: Regular rate and rhythm. No rubs, murmurs, or gallops. Abdomen: Soft, nontender, nondistended. No organomegaly noted, normoactive bowel sounds. Musculoskeletal: No edema, cyanosis, or clubbing. Neuro: Alert, answering all questions appropriately. Cranial nerves  grossly intact. Skin: No rashes or petechiae noted. Psych: Normal affect. Lymphatics: No cervical, calvicular, axillary or inguinal LAD.   LAB RESULTS:  Lab Results  Component Value Date   NA 141 11/10/2012   K 3.9 11/10/2012   CL 109 (H) 11/10/2012   CO2 28 11/10/2012   GLUCOSE 89 11/10/2012   BUN 13 11/10/2012   CREATININE 0.66  11/10/2012   CALCIUM 8.7 11/10/2012   PROT 6.4 10/16/2009   ALBUMIN 4.1 10/16/2009   AST 41 (H) 10/16/2009   ALT 40 10/16/2009   ALKPHOS 43 10/16/2009   BILITOT 1.0 10/16/2009   GFRNONAA >60 11/10/2012   GFRAA >60 11/10/2012    Lab Results  Component Value Date   WBC 10.1 04/07/2014   NEUTROABS 6.0 04/07/2014   HGB 15.0 04/07/2014   HCT 46.1 04/07/2014   MCV 89.8 04/07/2014   PLT 276.0 04/07/2014     STUDIES: No results found.  ASSESSMENT: Lymphocytosis  PLAN:    1. Lymphocytosis:  Patient expressed understanding and was in agreement with this plan. He also understands that He can call clinic at any time with any questions, concerns, or complaints.   No matching staging information was found for the patient.  Lloyd Huger, MD   03/10/2016 11:08 PM

## 2016-03-12 ENCOUNTER — Inpatient Hospital Stay: Payer: Medicare Other | Admitting: Oncology

## 2016-03-12 DIAGNOSIS — C61 Malignant neoplasm of prostate: Secondary | ICD-10-CM | POA: Diagnosis not present

## 2016-03-12 NOTE — Addendum Note (Signed)
Encounter addended by: Heywood Footman, RN on: 03/12/2016  9:56 AM<BR>    Actions taken: Charge Capture section accepted

## 2016-03-13 ENCOUNTER — Ambulatory Visit
Admission: RE | Admit: 2016-03-13 | Discharge: 2016-03-13 | Disposition: A | Discharge status: Home / Self Care | Payer: Medicare Other | Source: Ambulatory Visit | Attending: Radiation Oncology | Admitting: Radiation Oncology

## 2016-03-13 DIAGNOSIS — C61 Malignant neoplasm of prostate: Secondary | ICD-10-CM | POA: Diagnosis not present

## 2016-03-14 ENCOUNTER — Ambulatory Visit
Admission: RE | Admit: 2016-03-14 | Discharge: 2016-03-14 | Disposition: A | Discharge status: Home / Self Care | Payer: Medicare Other | Source: Ambulatory Visit | Attending: Radiation Oncology | Admitting: Radiation Oncology

## 2016-03-14 ENCOUNTER — Encounter: Payer: Self-pay | Admitting: Radiation Oncology

## 2016-03-14 VITALS — BP 137/92 | HR 77 | Temp 98.6°F | Resp 18 | Ht 69.0 in | Wt 190.0 lb

## 2016-03-14 DIAGNOSIS — C61 Malignant neoplasm of prostate: Secondary | ICD-10-CM

## 2016-03-14 NOTE — Progress Notes (Signed)
Department of Radiation Oncology  Phone:  [phone] Fax:        [phone]  Weekly Treatment Note    Name: Wesley [name] Date: 03/15/2016 MRN: [id number] DOB: 1940-09-18   Diagnosis:     ICD-9-CM ICD-10-CM   1. Malignant neoplasm of prostate ([place]) 185 C61      Current dose: 3.6 Gy  Current fraction: 2   MEDICATIONS: Current Outpatient Prescriptions  Medication Sig Dispense Refill  . Ascorbic Acid (VITAMIN C) 1000 MG tablet Take 1,000 mg by mouth daily.     Marland Kitchen aspirin 81 MG EC tablet Take 81 mg by mouth 2 (two) times daily.     Marland Kitchen aspirin-acetaminophen-caffeine (EXCEDRIN MIGRAINE) 250-250-65 MG per tablet Take 1 tablet by mouth every 6 (six) hours as needed.      Marland Kitchen atorvastatin (LIPITOR) 80 MG tablet Take 80 mg by mouth daily.      . Calcium Carbonate-Vitamin D (CALCIUM PLUS VITAMIN D PO) Take 600 mg by mouth daily.     . calcium elemental as carbonate (BARIATRIC TUMS ULTRA) 400 MG chewable tablet Chew by mouth.    . cetirizine (ZYRTEC) 10 MG tablet Take by mouth.    . Cholecalciferol (VITAMIN D-3) 1000 UNITS CAPS Take 2,000 Units by mouth daily.    . Coenzyme Q10 50 MG CAPS Take 200 mg by mouth daily.     Marland Kitchen CRANBERRY PO Take 84 mg by mouth daily.    . cyanocobalamin 2000 MCG tablet Take 1,000 mcg by mouth daily.     Marland Kitchen GARLIC PO Take [id number] mg by mouth daily.    . Glucosamine Sulfate 1000 MG CAPS Take by mouth.    Marland Kitchen glucosamine-chondroitin 500-400 MG tablet Take 1 tablet by mouth 3 (three) times daily.      . Ibuprofen-Diphenhydramine Cit (ADVIL PM PO) Take by mouth as needed.    . Loratadine (CLARITIN PO) Take by mouth daily as needed.    Marland Kitchen LUMIGAN 0.01 % SOLN Place 1 drop into both eyes daily.    . magnesium oxide (MAG-OX) 400 MG tablet Take 400 mg by mouth daily.    . meloxicam (MOBIC) 15 MG tablet TK 1 T PO QD  2  . Methylsulfonylmethane (MSM PO) Take 1 capsule by mouth daily.      . Multiple Vitamin (MULTIVITAMIN) tablet Take 1 tablet by mouth daily.       Marland Kitchen NEXIUM 40 MG capsule TAKE 1 CAPSULE (40 MG TOTAL) BY MOUTH DAILY AT 12 NOON. 30 capsule 2  . Omega-3 Fatty Acids (FISH OIL PO) Take 400 mg by mouth daily. Fish, flax, borage oil with omega    . pantoprazole (PROTONIX) 40 MG tablet TK 1 T PO ONCE A DAY.  7  . tamsulosin (FLOMAX) 0.4 MG CAPS capsule Take 0.4 mg by mouth daily.    . traMADol (ULTRAM) 50 MG tablet TAKE 1 TABLET BY MOUTH EVERY 4 TO 6 HOURS AS NEEDED 90 tablet 0  . zinc gluconate 50 MG tablet Take 100 mg by mouth daily.     Marland Kitchen Zn-Pyg Afri-Nettle-Saw Palmet (SAW PALMETTO COMPLEX PO) Take 1 capsule by mouth daily.      . fluticasone (FLONASE) 50 MCG/ACT nasal spray Place into the nose.     No current facility-administered medications for this encounter.      ALLERGIES: Amoxicillin and Other   LABORATORY DATA:  Lab Results  Component Value Date   WBC 10.1 04/07/2014   HGB 15.0 04/07/2014   HCT 46.1  04/07/2014   MCV 89.8 04/07/2014   PLT 276.0 04/07/2014   Lab Results  Component Value Date   NA 141 11/10/2012   K 3.9 11/10/2012   CL 109 (H) 11/10/2012   CO2 28 11/10/2012   Lab Results  Component Value Date   ALT 40 10/16/2009   AST 41 (H) 10/16/2009   ALKPHOS 43 10/16/2009   BILITOT 1.0 10/16/2009     NARRATIVE: Wesley [name] was seen today for weekly treatment management. The chart was checked and the patient's films were reviewed.  Patient has no complaints. Patient denies hematuria, dysuria, irregular bowels. Patient education has been completed, including how best to manage side effects. Patient notes a good appetite. He notes fatigue in the evening because he is helping to take care of his girlfirend's 28 year old mother. Denies pain today.  PHYSICAL EXAMINATION: height is 5\' 9"  (1.753 m) and weight is 190 lb (86.2 kg). His oral temperature is 98.6 F (37 C). His blood pressure is 137/92 (abnormal) and his pulse is 77. His respiration is 18 and oxygen saturation is 95%.       ASSESSMENT: The  patient is doing satisfactorily with treatment.  PLAN: We will continue with the patient's radiation treatment as planned.   Patient notes he has an elevated white blood cell count. He questioned if this needs following up.     This document serves as a record of services personally performed by Kyung Rudd, MD. It was created on his behalf by Bethann Humble, a trained medical scribe. The creation of this record is based on the scribe's personal observations and the provider's statements to them. This document has been checked and approved by the attending provider.

## 2016-03-14 NOTE — Progress Notes (Addendum)
Weekly rad txs prostate  2/45 txs, no issues, patient education done, no hematuria, dysuria, bowels regular, gave radiation therapy and you book, by  Guadelupe Sabin and Joaquim Lai RN business card, discussed ways top manage side effects, nausea, diarrhea, fatigue, bladder changes, imodium and low fiber diet if having diarrhea, smaller meals 5-6 if nauseated,  Drink plenty water, stay hydrated.  Appetite is good.  Having fatigue in the evening, helping with 31 year mother of girlfriend.  Denies pain today. Wt Readings from Last 3 Encounters:  03/06/16 191 lb 3.2 oz (86.7 kg)  11/20/15 190 lb 12.8 oz (86.5 kg)  01/11/15 175 lb (79.4 kg)  BP (!) 137/92 (BP Location: Left Arm, Patient Position: Sitting, Cuff Size: Normal)   Pulse 77   Temp 98.6 F (37 C) (Oral)   Resp 18   Ht 5\' 9"  (1.753 m)   Wt 190 lb (86.2 kg)   SpO2 95%   BMI 28.06 kg/m

## 2016-03-17 ENCOUNTER — Ambulatory Visit
Admission: RE | Admit: 2016-03-17 | Discharge: 2016-03-17 | Disposition: A | Discharge status: Home / Self Care | Payer: Medicare Other | Source: Ambulatory Visit | Attending: Radiation Oncology | Admitting: Radiation Oncology

## 2016-03-17 DIAGNOSIS — C61 Malignant neoplasm of prostate: Secondary | ICD-10-CM | POA: Diagnosis not present

## 2016-03-18 ENCOUNTER — Ambulatory Visit
Admission: RE | Admit: 2016-03-18 | Discharge: 2016-03-18 | Disposition: A | Discharge status: Home / Self Care | Payer: Medicare Other | Source: Ambulatory Visit | Attending: Radiation Oncology | Admitting: Radiation Oncology

## 2016-03-18 ENCOUNTER — Telehealth: Payer: Self-pay | Admitting: Radiation Oncology

## 2016-03-18 DIAGNOSIS — R899 Unspecified abnormal finding in specimens from other organs, systems and tissues: Secondary | ICD-10-CM

## 2016-03-18 DIAGNOSIS — C61 Malignant neoplasm of prostate: Secondary | ICD-10-CM | POA: Diagnosis not present

## 2016-03-18 NOTE — Telephone Encounter (Signed)
I spoke with the patient about his CBC findings. Apparently his PCP found his WBC to be elevated without medications or infection to support a diagnosis for this. He has recommended a hematologist to evaluate the patient. Dr. Tammi Klippel would recommend Dr. Benay Spice and I have contacted Dr. Caryl Comes and he is concerned about B cell population on his electrophoresis. I will make a referral to Dr. Benay Spice.

## 2016-03-19 ENCOUNTER — Ambulatory Visit
Admission: RE | Admit: 2016-03-19 | Discharge: 2016-03-19 | Disposition: A | Discharge status: Home / Self Care | Payer: Medicare Other | Source: Ambulatory Visit | Attending: Radiation Oncology | Admitting: Radiation Oncology

## 2016-03-19 DIAGNOSIS — C61 Malignant neoplasm of prostate: Secondary | ICD-10-CM | POA: Diagnosis not present

## 2016-03-20 ENCOUNTER — Ambulatory Visit
Admission: RE | Admit: 2016-03-20 | Discharge: 2016-03-20 | Disposition: A | Discharge status: Home / Self Care | Payer: Medicare Other | Source: Ambulatory Visit | Attending: Radiation Oncology | Admitting: Radiation Oncology

## 2016-03-20 VITALS — BP 138/87 | HR 83 | Resp 16 | Wt 188.6 lb

## 2016-03-20 DIAGNOSIS — C61 Malignant neoplasm of prostate: Secondary | ICD-10-CM | POA: Diagnosis not present

## 2016-03-20 NOTE — Progress Notes (Signed)
Weight and vitals stable. Denies pain. Reports new onset nocturia x 3 and urinary frequency. Denies dysuria or hematuria. Reports occasional urgency. Continues to have hot flashes. Denies diarrhea. Reports fatigue.   BP 138/87 (BP Location: Right Arm, Patient Position: Sitting, Cuff Size: Normal)   Pulse 83   Resp 16   Wt 188 lb 9.6 oz (85.5 kg)   SpO2 100%   BMI 27.85 kg/m  Wt Readings from Last 3 Encounters:  03/20/16 188 lb 9.6 oz (85.5 kg)  03/14/16 190 lb (86.2 kg)  03/06/16 191 lb 3.2 oz (86.7 kg)

## 2016-03-20 NOTE — Progress Notes (Signed)
  Radiation Oncology         [phone]   Name: Wesley [name] MRN: [id number]   Date: 03/20/2016  DOB: [birth year]    Weekly Radiation Therapy Management    ICD-9-CM ICD-10-CM   1. Malignant neoplasm of prostate ([facility]) 185 C61     Current Dose: 10.8 Gy  Planned Dose:  75 Gy  Narrative The patient presents for routine under treatment assessment.  Weight and vitals stable. Denies pain. Reports new onset nocturia x 3 and urinary frequency. Denies dysuria or hematuria. Reports occasional urgency. Continues to have hot flashes. Denies diarrhea. Reports fatigue.  The patient is without complaint. Set-up films were reviewed. The chart was checked.  Physical Findings  weight is 188 lb 9.6 oz (85.5 kg). His blood pressure is 138/87 and his pulse is 83. His respiration is 16 and oxygen saturation is 100%. . Weight essentially stable.  No significant changes.  Impression The patient is tolerating radiation.  Plan Continue treatment as planned.         Sheral Apley Tammi Klippel, M.D. This document serves as a record of services personally performed by Tyler Pita, MD. It was created on his behalf by Bethann Humble, a trained medical scribe. The creation of this record is based on the scribe's personal observations and the provider's statements to them. This document has been checked and approved by the attending provider.

## 2016-03-21 ENCOUNTER — Ambulatory Visit
Admission: RE | Admit: 2016-03-21 | Discharge: 2016-03-21 | Disposition: A | Discharge status: Home / Self Care | Payer: Medicare Other | Source: Ambulatory Visit | Attending: Radiation Oncology | Admitting: Radiation Oncology

## 2016-03-21 DIAGNOSIS — C61 Malignant neoplasm of prostate: Secondary | ICD-10-CM | POA: Diagnosis not present

## 2016-03-24 ENCOUNTER — Ambulatory Visit
Admission: RE | Admit: 2016-03-24 | Discharge: 2016-03-24 | Disposition: A | Discharge status: Home / Self Care | Payer: Medicare Other | Source: Ambulatory Visit | Attending: Radiation Oncology | Admitting: Radiation Oncology

## 2016-03-24 DIAGNOSIS — C61 Malignant neoplasm of prostate: Secondary | ICD-10-CM | POA: Diagnosis not present

## 2016-03-25 ENCOUNTER — Telehealth: Payer: Self-pay | Admitting: Hematology and Oncology

## 2016-03-25 ENCOUNTER — Ambulatory Visit
Admission: RE | Admit: 2016-03-25 | Discharge: 2016-03-25 | Disposition: A | Discharge status: Home / Self Care | Payer: Medicare Other | Source: Ambulatory Visit | Attending: Radiation Oncology | Admitting: Radiation Oncology

## 2016-03-25 DIAGNOSIS — C61 Malignant neoplasm of prostate: Secondary | ICD-10-CM | POA: Diagnosis not present

## 2016-03-25 NOTE — Telephone Encounter (Signed)
created in error.

## 2016-03-26 ENCOUNTER — Ambulatory Visit
Admission: RE | Admit: 2016-03-26 | Discharge: 2016-03-26 | Disposition: A | Discharge status: Home / Self Care | Payer: Medicare Other | Source: Ambulatory Visit | Attending: Radiation Oncology | Admitting: Radiation Oncology

## 2016-03-26 DIAGNOSIS — C61 Malignant neoplasm of prostate: Secondary | ICD-10-CM | POA: Diagnosis not present

## 2016-03-27 ENCOUNTER — Ambulatory Visit
Admission: RE | Admit: 2016-03-27 | Discharge: 2016-03-27 | Disposition: A | Discharge status: Home / Self Care | Payer: Medicare Other | Source: Ambulatory Visit | Attending: Radiation Oncology | Admitting: Radiation Oncology

## 2016-03-27 DIAGNOSIS — C61 Malignant neoplasm of prostate: Secondary | ICD-10-CM | POA: Diagnosis not present

## 2016-03-28 ENCOUNTER — Telehealth: Payer: Self-pay | Admitting: *Deleted

## 2016-03-28 ENCOUNTER — Ambulatory Visit
Admission: RE | Admit: 2016-03-28 | Discharge: 2016-03-28 | Disposition: A | Discharge status: Home / Self Care | Payer: Medicare Other | Source: Ambulatory Visit | Attending: Radiation Oncology | Admitting: Radiation Oncology

## 2016-03-28 ENCOUNTER — Encounter: Payer: Self-pay | Admitting: Radiation Oncology

## 2016-03-28 VITALS — BP 135/78 | HR 72 | Temp 98.6°F | Ht 69.0 in | Wt 189.6 lb

## 2016-03-28 DIAGNOSIS — C61 Malignant neoplasm of prostate: Secondary | ICD-10-CM

## 2016-03-28 NOTE — Progress Notes (Signed)
Wesley [name] has completed 12 fractions to his pelvis.  He reports having a flare up of pain in his lower back and legs this week.  He takes tramadol as needed.  He reports having mild to moderate dysuria with mild groin pain Monday and Tuesday which has went away.  He denies hematuria.  He continues to report having urinary frequency and urgency.  He reports nocturia 1-3 times per night.  He reports having some constipation with his last bm yesterday.  He denies having fatigue.  BP 135/78 (BP Location: Right Arm, Patient Position: Sitting)   Pulse 72   Temp 98.6 F (37 C) (Oral)   Ht 5\' 9"  (1.753 m)   Wt 189 lb 9.6 oz (86 kg)   SpO2 95%   BMI 28.00 kg/m    Wt Readings from Last 3 Encounters:  03/28/16 189 lb 9.6 oz (86 kg)  03/20/16 188 lb 9.6 oz (85.5 kg)  03/14/16 190 lb (86.2 kg)

## 2016-03-28 NOTE — Progress Notes (Signed)
  Radiation Oncology         [phone]   Name: Wesley [name] MRN: [id number]   Date: 03/28/2016  DOB: 1940/09/09    Weekly Radiation Therapy Management    ICD-9-CM ICD-10-CM   1. Malignant neoplasm of prostate ([facility]) 185 C61     Current Dose: 21.6 Gy  Planned Dose:  75 Gy  Narrative The patient presents for routine under treatment assessment.  He reports having a flare up of pain in his lower back and legs this week.  He takes Tramadol as needed.  He reports having mild to moderate dysuria with mild groin pain Monday and Tuesday which has went away.  He denies hematuria.  He continues to report having urinary frequency and urgency.  He reports nocturia 1-3 times per night.  He reports having some constipation with his last bm yesterday.  He denies having fatigue. He has not seen Dr. Benay Spice as of yet.  Set-up films were reviewed. The chart was checked.  Physical Findings  height is 5\' 9"  (1.753 m) and weight is 189 lb 9.6 oz (86 kg). His oral temperature is 98.6 F (37 C). His blood pressure is 135/78 and his pulse is 72. His oxygen saturation is 95%. . Weight essentially stable.  No significant changes.  Impression The patient is tolerating radiation.  Plan Continue treatment as planned. I would like the patient to see Dr. Benay Spice regarding his WBC levels.         Sheral Apley Tammi Klippel, M.D.  This document serves as a record of services personally performed by Tyler Pita, MD. It was created on his behalf by Darcus Austin, a trained medical scribe. The creation of this record is based on the scribe's personal observations and the provider's statements to them. This document has been checked and approved by the attending provider.

## 2016-03-28 NOTE — Telephone Encounter (Signed)
Called patient to inform of appt. With Dr. Alen Blew on 04-17-16 @ 11 am with Dr. Alen Blew, lvm for a return call

## 2016-03-31 ENCOUNTER — Ambulatory Visit
Admission: RE | Admit: 2016-03-31 | Discharge: 2016-03-31 | Disposition: A | Discharge status: Home / Self Care | Payer: Medicare Other | Source: Ambulatory Visit | Attending: Radiation Oncology | Admitting: Radiation Oncology

## 2016-03-31 DIAGNOSIS — C61 Malignant neoplasm of prostate: Secondary | ICD-10-CM | POA: Diagnosis not present

## 2016-04-01 ENCOUNTER — Ambulatory Visit
Admission: RE | Admit: 2016-04-01 | Discharge: 2016-04-01 | Disposition: A | Discharge status: Home / Self Care | Payer: Medicare Other | Source: Ambulatory Visit | Attending: Radiation Oncology | Admitting: Radiation Oncology

## 2016-04-01 DIAGNOSIS — C61 Malignant neoplasm of prostate: Secondary | ICD-10-CM | POA: Diagnosis not present

## 2016-04-02 ENCOUNTER — Ambulatory Visit
Admission: RE | Admit: 2016-04-02 | Discharge: 2016-04-02 | Disposition: A | Discharge status: Home / Self Care | Payer: Medicare Other | Source: Ambulatory Visit | Attending: Radiation Oncology | Admitting: Radiation Oncology

## 2016-04-02 DIAGNOSIS — C61 Malignant neoplasm of prostate: Secondary | ICD-10-CM | POA: Diagnosis not present

## 2016-04-03 ENCOUNTER — Ambulatory Visit
Admission: RE | Admit: 2016-04-03 | Discharge: 2016-04-03 | Disposition: A | Discharge status: Home / Self Care | Payer: Medicare Other | Source: Ambulatory Visit | Attending: Radiation Oncology | Admitting: Radiation Oncology

## 2016-04-03 ENCOUNTER — Encounter: Payer: Self-pay | Admitting: Radiation Oncology

## 2016-04-03 VITALS — BP 132/79 | HR 77 | Resp 16 | Wt 187.6 lb

## 2016-04-03 DIAGNOSIS — C61 Malignant neoplasm of prostate: Secondary | ICD-10-CM

## 2016-04-03 DIAGNOSIS — D72820 Lymphocytosis (symptomatic): Secondary | ICD-10-CM

## 2016-04-03 NOTE — Progress Notes (Addendum)
Weight and vitals stable. Reports pain related to effects of arthritis. Reports nocturia x 3. Reports urinary frequency. Reports mild dysuria. Denies hematuria. Reports occasional difficulty emptying his bladder and urinary urgency. Continued hot flashes. Denies diarrhea. Reports fatigue. Reports he is scheduled for his next Lupron on November 21.   BP 132/79   Pulse 77   Resp 16   Wt 187 lb 9.6 oz (85.1 kg)   SpO2 100%   BMI 27.70 kg/m  Wt Readings from Last 3 Encounters:  04/03/16 187 lb 9.6 oz (85.1 kg)  03/28/16 189 lb 9.6 oz (86 kg)  03/20/16 188 lb 9.6 oz (85.5 kg)

## 2016-04-03 NOTE — Progress Notes (Signed)
  Radiation Oncology         [phone]   Name: Wesley [name] MRN: [id number]   Date: 04/03/2016  DOB: 02/02/1941    Weekly Radiation Therapy Management    ICD-9-CM ICD-10-CM   1. Malignant neoplasm of prostate ([facility]) 185 C61     Current Dose: 28.8 Gy  Planned Dose:  75 Gy  Narrative The patient presents for routine under treatment assessment.  Weight and vitals stable. Reports pain related to effects of arthritis. Reports nocturia x 3. Reports urinary frequency (unchanged). Reports mild dysuria. Denies hematuria. Reports occasional difficulty emptying his bladder and urinary urgency. Continued hot flashes. Denies diarrhea. Reports fatigue. Reports he is scheduled for his next Lupron on November 21.  Set-up films were reviewed. The chart was checked.  Physical Findings  weight is 187 lb 9.6 oz (85.1 kg). His blood pressure is 132/79 and his pulse is 77. His respiration is 16 and oxygen saturation is 100%. . Weight essentially stable.  No significant changes.  Impression The patient is tolerating radiation.  Plan Continue treatment as planned.          Sheral Apley Tammi Klippel, M.D.  This document serves as a record of services personally performed by Tyler Pita, MD. It was created on his behalf by Maryla Morrow, a trained medical scribe. The creation of this record is based on the scribe's personal observations and the provider's statements to them. This document has been checked and approved by the attending provider.

## 2016-04-03 NOTE — Progress Notes (Signed)
Please see if he is being scheduled for new patient here.  I can see him if not already scheduled.

## 2016-04-04 ENCOUNTER — Ambulatory Visit
Admission: RE | Admit: 2016-04-04 | Discharge: 2016-04-04 | Disposition: A | Discharge status: Home / Self Care | Payer: Medicare Other | Source: Ambulatory Visit | Attending: Radiation Oncology | Admitting: Radiation Oncology

## 2016-04-04 DIAGNOSIS — C61 Malignant neoplasm of prostate: Secondary | ICD-10-CM | POA: Diagnosis not present

## 2016-04-07 ENCOUNTER — Ambulatory Visit
Admission: RE | Admit: 2016-04-07 | Discharge: 2016-04-07 | Disposition: A | Discharge status: Home / Self Care | Payer: Medicare Other | Source: Ambulatory Visit | Attending: Radiation Oncology | Admitting: Radiation Oncology

## 2016-04-07 DIAGNOSIS — C61 Malignant neoplasm of prostate: Secondary | ICD-10-CM | POA: Diagnosis not present

## 2016-04-08 ENCOUNTER — Ambulatory Visit
Admission: RE | Admit: 2016-04-08 | Discharge: 2016-04-08 | Disposition: A | Discharge status: Home / Self Care | Payer: Medicare Other | Source: Ambulatory Visit | Attending: Radiation Oncology | Admitting: Radiation Oncology

## 2016-04-08 DIAGNOSIS — C61 Malignant neoplasm of prostate: Secondary | ICD-10-CM | POA: Diagnosis not present

## 2016-04-09 ENCOUNTER — Ambulatory Visit
Admission: RE | Admit: 2016-04-09 | Discharge: 2016-04-09 | Disposition: A | Discharge status: Home / Self Care | Payer: Medicare Other | Source: Ambulatory Visit | Attending: Radiation Oncology | Admitting: Radiation Oncology

## 2016-04-09 DIAGNOSIS — C61 Malignant neoplasm of prostate: Secondary | ICD-10-CM | POA: Diagnosis not present

## 2016-04-10 ENCOUNTER — Encounter: Payer: Self-pay | Admitting: Radiation Oncology

## 2016-04-10 ENCOUNTER — Ambulatory Visit
Admission: RE | Admit: 2016-04-10 | Discharge: 2016-04-10 | Disposition: A | Discharge status: Home / Self Care | Payer: Medicare Other | Source: Ambulatory Visit | Attending: Radiation Oncology | Admitting: Radiation Oncology

## 2016-04-10 VITALS — BP 130/78 | HR 77 | Temp 98.1°F | Resp 18 | Ht 69.0 in | Wt 188.2 lb

## 2016-04-10 DIAGNOSIS — C61 Malignant neoplasm of prostate: Secondary | ICD-10-CM | POA: Diagnosis not present

## 2016-04-10 NOTE — Progress Notes (Signed)
  Radiation Oncology         [phone]   Name: Wesley [name] MRN: [id number]   Date: 04/10/2016  DOB: 1941-01-09    Weekly Radiation Therapy Management    ICD-9-CM ICD-10-CM   1. Malignant neoplasm of prostate ([facility]) 185 C61     Current Dose: 37.8 Gy  Planned Dose:  75 Gy  Narrative The patient presents for routine under treatment assessment.  Weight and vitals stable. Reports pain related to effects of arthritis in the left leg. He experienced this pain prior to radiation but the positioning on the table may be exacerbating it. Reports nocturia x 3. Reports urinary frequency. Reports mild dysuria. Denies hematuria. Reports occasional difficulty emptying his bladder and urinary urgency. Continued hot flashes . Denies diarrhea. Reports mild fatigue. Reports he is scheduled for his next Lupron on November 21.  Set-up films were reviewed. The chart was checked.  Physical Findings  height is 5\' 9"  (1.753 m) and weight is 188 lb 3.2 oz (85.4 kg). His oral temperature is 98.1 F (36.7 C). His blood pressure is 130/78 and his pulse is 77. His respiration is 18 and oxygen saturation is 99%. . Weight essentially stable.  No significant changes.  Impression The patient is tolerating radiation.  Plan Continue treatment as planned.          Sheral Apley Tammi Klippel, M.D.  This document serves as a record of services personally performed by Tyler Pita, MD. It was created on his behalf by Arlyce Harman, a trained medical scribe. The creation of this record is based on the scribe's personal observations and the provider's statements to them. This document has been checked and approved by the attending provider.

## 2016-04-10 NOTE — Progress Notes (Signed)
Weight and vitals stable. Reports pain related to effects of arthritis left leg. Reports nocturia x 3. Reports urinary frequency. Reports mild dysuria. Denies hematuria. Reports occasional difficulty emptying his bladder and urinary urgency. Continued hot flashes. Denies diarrhea. Reports  Mild fatigue. Reports he is scheduled for his next Lupron on November 21.  Wt Readings from Last 3 Encounters:  04/10/16 188 lb 3.2 oz (85.4 kg)  04/03/16 187 lb 9.6 oz (85.1 kg)  03/28/16 189 lb 9.6 oz (86 kg)  BP 130/78 (BP Location: Right Arm, Patient Position: Sitting, Cuff Size: Normal)   Pulse 77   Temp 98.1 F (36.7 C) (Oral)   Resp 18   Ht 5\' 9"  (1.753 m)   Wt 188 lb 3.2 oz (85.4 kg)   SpO2 99%   BMI 27.79 kg/m

## 2016-04-11 ENCOUNTER — Ambulatory Visit
Admission: RE | Admit: 2016-04-11 | Discharge: 2016-04-11 | Disposition: A | Discharge status: Home / Self Care | Payer: Medicare Other | Source: Ambulatory Visit | Attending: Radiation Oncology | Admitting: Radiation Oncology

## 2016-04-11 DIAGNOSIS — C61 Malignant neoplasm of prostate: Secondary | ICD-10-CM | POA: Diagnosis not present

## 2016-04-14 ENCOUNTER — Ambulatory Visit
Admission: RE | Admit: 2016-04-14 | Discharge: 2016-04-14 | Disposition: A | Discharge status: Home / Self Care | Payer: Medicare Other | Source: Ambulatory Visit | Attending: Radiation Oncology | Admitting: Radiation Oncology

## 2016-04-14 DIAGNOSIS — C61 Malignant neoplasm of prostate: Secondary | ICD-10-CM | POA: Diagnosis not present

## 2016-04-15 ENCOUNTER — Ambulatory Visit
Admission: RE | Admit: 2016-04-15 | Discharge: 2016-04-15 | Disposition: A | Discharge status: Home / Self Care | Payer: Medicare Other | Source: Ambulatory Visit | Attending: Radiation Oncology | Admitting: Radiation Oncology

## 2016-04-15 DIAGNOSIS — C61 Malignant neoplasm of prostate: Secondary | ICD-10-CM | POA: Diagnosis not present

## 2016-04-16 ENCOUNTER — Ambulatory Visit
Admission: RE | Admit: 2016-04-16 | Discharge: 2016-04-16 | Disposition: A | Discharge status: Home / Self Care | Payer: Medicare Other | Source: Ambulatory Visit | Attending: Radiation Oncology | Admitting: Radiation Oncology

## 2016-04-16 DIAGNOSIS — C61 Malignant neoplasm of prostate: Secondary | ICD-10-CM | POA: Diagnosis not present

## 2016-04-17 ENCOUNTER — Ambulatory Visit (HOSPITAL_BASED_OUTPATIENT_CLINIC_OR_DEPARTMENT_OTHER): Payer: Medicare Other | Admitting: Oncology

## 2016-04-17 ENCOUNTER — Ambulatory Visit
Admission: RE | Admit: 2016-04-17 | Discharge: 2016-04-17 | Disposition: A | Discharge status: Home / Self Care | Payer: Medicare Other | Source: Ambulatory Visit | Attending: Radiation Oncology | Admitting: Radiation Oncology

## 2016-04-17 ENCOUNTER — Other Ambulatory Visit (HOSPITAL_COMMUNITY)
Admission: RE | Admit: 2016-04-17 | Discharge: 2016-04-17 | Disposition: A | Discharge status: Home / Self Care | Payer: Medicare Other | Source: Ambulatory Visit | Attending: Oncology | Admitting: Oncology

## 2016-04-17 ENCOUNTER — Telehealth: Payer: Self-pay | Admitting: Oncology

## 2016-04-17 ENCOUNTER — Ambulatory Visit (HOSPITAL_BASED_OUTPATIENT_CLINIC_OR_DEPARTMENT_OTHER): Payer: Medicare Other

## 2016-04-17 VITALS — BP 128/66 | HR 85 | Temp 97.8°F | Resp 18 | Ht 69.0 in | Wt 185.1 lb

## 2016-04-17 DIAGNOSIS — D72820 Lymphocytosis (symptomatic): Secondary | ICD-10-CM | POA: Insufficient documentation

## 2016-04-17 DIAGNOSIS — C61 Malignant neoplasm of prostate: Secondary | ICD-10-CM | POA: Diagnosis not present

## 2016-04-17 DIAGNOSIS — Z800 Family history of malignant neoplasm of digestive organs: Secondary | ICD-10-CM | POA: Diagnosis not present

## 2016-04-17 LAB — COMPREHENSIVE METABOLIC PANEL
ALT: 26 U/L (ref 0–55)
ANION GAP: 5 meq/L (ref 3–11)
AST: 28 U/L (ref 5–34)
Albumin: 3.5 g/dL (ref 3.5–5.0)
Alkaline Phosphatase: 76 U/L (ref 40–150)
BILIRUBIN TOTAL: 0.41 mg/dL (ref 0.20–1.20)
BUN: 22.6 mg/dL (ref 7.0–26.0)
CHLORIDE: 110 meq/L — AB (ref 98–109)
CO2: 27 meq/L (ref 22–29)
Calcium: 9.8 mg/dL (ref 8.4–10.4)
Creatinine: 0.8 mg/dL (ref 0.7–1.3)
EGFR: 89 mL/min/{1.73_m2} — AB (ref >90)
Glucose: 113 mg/dl (ref 70–140)
POTASSIUM: 4.4 meq/L (ref 3.5–5.1)
SODIUM: 142 meq/L (ref 136–145)
Total Protein: 6.2 g/dL — ABNORMAL LOW (ref 6.4–8.3)

## 2016-04-17 LAB — LACTATE DEHYDROGENASE: LDH: 184 U/L (ref 125–245)

## 2016-04-17 LAB — CBC WITH DIFFERENTIAL/PLATELET
BASO%: 0.4 % (ref 0.0–2.0)
Basophils Absolute: 0 10*3/uL (ref 0.0–0.1)
EOS ABS: 0.6 10*3/uL — AB (ref 0.0–0.5)
EOS%: 8.3 % — ABNORMAL HIGH (ref 0.0–7.0)
HCT: 39.3 % (ref 38.4–49.9)
HGB: 13.3 g/dL (ref 13.0–17.1)
LYMPH%: 27.5 % (ref 14.0–49.0)
MCH: 30.5 pg (ref 27.2–33.4)
MCHC: 33.8 g/dL (ref 32.0–36.0)
MCV: 90.1 fL (ref 79.3–98.0)
MONO#: 0.8 10*3/uL (ref 0.1–0.9)
MONO%: 11.4 % (ref 0.0–14.0)
NEUT#: 3.7 10*3/uL (ref 1.5–6.5)
NEUT%: 52.4 % (ref 39.0–75.0)
PLATELETS: 213 10*3/uL (ref 140–400)
RBC: 4.36 10*6/uL (ref 4.20–5.82)
RDW: 13.1 % (ref 11.0–14.6)
WBC: 7.1 10*3/uL (ref 4.0–10.3)
lymph#: 2 10*3/uL (ref 0.9–3.3)

## 2016-04-17 NOTE — Telephone Encounter (Signed)
Labs added for today. Lab and follow up with Md, scheduled for April, per 04/17/16 los. AVS report and appointment schedule given to patient, per 04/17/16 los.

## 2016-04-17 NOTE — Progress Notes (Signed)
Reason for Referral: Eosinophilia.   HPI: 75 year old gentleman native of [place] but have been living in this area for an extended period of time. This gentleman with coronary artery disease as well as other comorbid conditions and diagnosed with prostate cancer in April 2017. At that time his PSA was up to 13.76 and underwent a prostate biopsy which showed a Gleason score 4+4 = 8 prostate cancer. He had total of 3 cores involved with cancer. He was started on androgen deprivation and currently receiving definitive therapy with external beam radiation. He was noted to have a mild leukocytosis with possible lymphocytosis and eosinophilia and I was asked to comment about these findings. Clinically he reports feeling reasonably well without any major complaints. He is a Research officer, trade union in town and he is remaining active and performing activities of daily living. He has reported some fatigue and tiredness associated with radiation treatment. He denied any hematuria or dysuria. He denied any fevers or chills or lymphadenopathy. Has not reported any constitutional symptoms.  He does not report any headaches, blurry vision, syncope or seizures. He does not report any fevers, chills or sweats. He does not report any cough, wheezing or hemoptysis. He does not report any nausea, vomiting or abdominal pain. He does not report any frequency urgency or hesitancy. He is not report any skeletal complaints. Remaining review of systems unremarkable.   Past Medical History:  Diagnosis Date  . Allergic rhinitis   . Chronic back pain   . Coronary atherosclerosis of autologous vein bypass graft   . Essential hypertension, benign    pt denies 04/07/14  . GERD (gastroesophageal reflux disease)   . Hyperlipidemia   . Intermittent vertigo   . Labral tear of shoulder    w/ fracture  . Macular degeneration   . OSA (obstructive sleep apnea)    sleep study 06/03/2012 - AHI 54.74/hr during total sleep, AHI 49.66/hr  during REM  . Prostate cancer ([facility])   . S/P CABG x 4 2005  . Stroke ([facility])   . Synovitis and tenosynovitis, unspecified   . Tubular adenoma of colon   . Unspecified vitamin D deficiency   :  Past Surgical History:  Procedure Laterality Date  . CARDIAC CATHETERIZATION  02/01/2004   L main with 60% prox stenosis, LAD calcified in prox section with 95% ostial lesion, diffusion 50-60% disease in mid LAD with focal 90% mid LAD stenosis; 1st diagonal coarsely irregular with up to 30% stenosis in prox & mid segments; L Cfx with 3 OMs, AV groove Cfx with 40-50% stenosis; 3rd OM with 50% prox stenosis; RCA with PDA & PLA, PLA with 95% osital lesion (Dr. Gerrie Nordmann)  . CATARACT EXTRACTION, BILATERAL    . COLONOSCOPY    . CORONARY ARTERY BYPASS GRAFT  02/02/2004   LIMA to DX1, RIMA to RCA, SVG to distal LAD, SVG to Cfx marginal (Dr. Remus Loffler)  . gold seed marker placement/prostate  02/28/2016  . [place]  2002  . NM MYOCAR PERF WALL MOTION  06/2012   lexiscan myoview - normal low risk study, EF 47%  . 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 TONSILLECTOMY AND ADENOIDECTOMY    . TOTAL KNEE ARTHROPLASTY Left    Dr. Marry Guan [facility] [place])  . TRANSTHORACIC ECHOCARDIOGRAM  06/2012   EF 50-55%, mild MR; LA mild-mod dilated; RVSP increased  . UPPER GASTROINTESTINAL ENDOSCOPY    :   Current Outpatient Prescriptions:  .  Ascorbic Acid (VITAMIN C) 1000 MG tablet, Take 1,000 mg by mouth daily. , Disp: , Rfl:  .  aspirin 81 MG EC tablet, Take 81 mg by mouth 2 (two) times daily. , Disp: , Rfl:  .  aspirin-acetaminophen-caffeine (EXCEDRIN MIGRAINE) 250-250-65 MG per tablet, Take 1 tablet by mouth every 6 (six) hours as needed.  , Disp: , Rfl:  .  atorvastatin (LIPITOR) 80 MG tablet, Take 80 mg by mouth daily.  , Disp: , Rfl:  .  Calcium Carbonate-Vitamin D (CALCIUM PLUS VITAMIN D PO), Take 600 mg by mouth daily. , Disp: , Rfl:  .  calcium elemental  as carbonate (BARIATRIC TUMS ULTRA) 400 MG chewable tablet, Chew by mouth., Disp: , Rfl:  .  cetirizine (ZYRTEC) 10 MG tablet, Take by mouth., Disp: , Rfl:  .  Cholecalciferol (VITAMIN D-3) 1000 UNITS CAPS, Take 2,000 Units by mouth daily., Disp: , Rfl:  .  Coenzyme Q10 50 MG CAPS, Take 200 mg by mouth daily. , Disp: , Rfl:  .  CRANBERRY PO, Take 84 mg by mouth daily., Disp: , Rfl:  .  cyanocobalamin 2000 MCG tablet, Take 1,000 mcg by mouth daily. , Disp: , Rfl:  .  fluticasone (FLONASE) 50 MCG/ACT nasal spray, Place into the nose., Disp: , Rfl:  .  GARLIC PO, Take [id number] mg by mouth daily., Disp: , Rfl:  .  Glucosamine Sulfate 1000 MG CAPS, Take by mouth., Disp: , Rfl:  .  glucosamine-chondroitin 500-400 MG tablet, Take 1 tablet by mouth 3 (three) times daily.  , Disp: , Rfl:  .  Ibuprofen-Diphenhydramine Cit (ADVIL PM PO), Take by mouth as needed., Disp: , Rfl:  .  Loratadine (CLARITIN PO), Take by mouth daily as needed., Disp: , Rfl:  .  LUMIGAN 0.01 % SOLN, Place 1 drop into both eyes daily., Disp: , Rfl:  .  magnesium oxide (MAG-OX) 400 MG tablet, Take 400 mg by mouth daily., Disp: , Rfl:  .  meloxicam (MOBIC) 15 MG tablet, TK 1 T PO QD, Disp: , Rfl: 2 .  Methylsulfonylmethane (MSM PO), Take 1 capsule by mouth daily.  , Disp: , Rfl:  .  Multiple Vitamin (MULTIVITAMIN) tablet, Take 1 tablet by mouth daily.  , Disp: , Rfl:  .  NEXIUM 40 MG capsule, TAKE 1 CAPSULE (40 MG TOTAL) BY MOUTH DAILY AT 12 NOON., Disp: 30 capsule, Rfl: 2 .  Omega-3 Fatty Acids (FISH OIL PO), Take 400 mg by mouth daily. Fish, flax, borage oil with omega, Disp: , Rfl:  .  pantoprazole (PROTONIX) 40 MG tablet, TK 1 T PO ONCE A DAY., Disp: , Rfl: 7 .  tamsulosin (FLOMAX) 0.4 MG CAPS capsule, Take 0.4 mg by mouth daily., Disp: , Rfl:  .  traMADol (ULTRAM) 50 MG tablet, TAKE 1 TABLET BY MOUTH EVERY 4 TO 6 HOURS AS NEEDED, Disp: 90 tablet, Rfl: 0 .  zinc gluconate 50 MG tablet, Take 100 mg by mouth daily. , Disp: , Rfl:  .   Zn-Pyg Afri-Nettle-Saw Palmet (SAW PALMETTO COMPLEX PO), Take 1 capsule by mouth daily.  , Disp: , Rfl: :  Allergies  Allergen Reactions  . Amoxicillin Hives and Nausea Only  :  Family History  Problem Relation Age of Onset  . Heart disease Mother   . Parkinsonism Father   . Diabetes Paternal Grandfather   . Stomach cancer Cousin   :  Social History   Social History  . Marital status: Married    Spouse  name: N/A  . Number of children: 3  . Years of education: N/A   Occupational History  . attorney    Social History Main Topics  . Smoking status: Never Smoker  . Smokeless tobacco: Never Used  . Alcohol use Yes     Comment: occasional beer every 3-4 week  . Drug use: No  . Sexual activity: Not Currently   Other Topics Concern  . Not on file   Social History Narrative  . No narrative on file  :  Pertinent items are noted in HPI.  Exam: Blood pressure 128/66, pulse 85, temperature 97.8 F (36.6 C), temperature source Oral, resp. rate 18, height 5\' 9"  (1.753 m), weight 185 lb 1.6 oz (84 kg), SpO2 95 %.  ECOG 0 General appearance: alert and cooperative appeared without distress. Nose: Nares normal. Septum midline. Mucosa normal. No drainage or sinus tenderness. Throat: No abnormalities in the mucous membrane. Scar tissue noted on his lower lip from surgical incision previously. No masses or lesions. Neck: no adenopathy Back: negative Resp: clear to auscultation bilaterally Chest wall: no tenderness Cardio: regular rate and rhythm, S1, S2 normal, no murmur, click, rub or gallop GI: soft, non-tender; bowel sounds normal; no masses,  no organomegaly Extremities: extremities normal, atraumatic, no cyanosis or edema   Recent Labs  04/17/16 1131  WBC 7.1  HGB 13.3  HCT 39.3  PLT 213    Recent Labs  04/17/16 1131  NA 142  K 4.4  CO2 27  GLUCOSE 113  BUN 22.6  CREATININE 0.8  CALCIUM 9.8     Assessment and Plan:   75 year old gentleman with the  following issues:  1. Eosinophilia: His CBC was reviewed today and his white cell count was normal with slight increase in eosinophil percentage of 8.3 the upper limit of normal is 7.0. His absolute eosinophil count is 600 with the upper limit of normal is 500. The differential diagnosis was discussed today with the patient. This likely represent reactive findings related to allergy versus medication.  Primary myeloproliferative disorder or lymphoproliferative disorder are considered less likely. He does not have any physical signs or symptoms to suggest a hematological disorder.  I recommended periodic observation and repeat CBC in 6 months.  2. Prostate cancer: He has a Gleason score 4+4 = 8 and a PSA of 13. He does not have any evidence of metastatic disease and currently receiving definitive treatment with androgen deprivation and external beam radiation. Therapy is well-tolerated and no further recommendation are needed for medical oncology.  3. Follow-up: Will be in 6 months for repeat CBC.

## 2016-04-18 ENCOUNTER — Encounter: Payer: Self-pay | Admitting: Radiation Oncology

## 2016-04-18 ENCOUNTER — Ambulatory Visit
Admission: RE | Admit: 2016-04-18 | Discharge: 2016-04-18 | Disposition: A | Discharge status: Home / Self Care | Payer: Medicare Other | Source: Ambulatory Visit | Attending: Radiation Oncology | Admitting: Radiation Oncology

## 2016-04-18 ENCOUNTER — Telehealth: Payer: Self-pay | Admitting: *Deleted

## 2016-04-18 VITALS — BP 134/72 | HR 78 | Resp 16 | Wt 180.8 lb

## 2016-04-18 DIAGNOSIS — C61 Malignant neoplasm of prostate: Secondary | ICD-10-CM | POA: Diagnosis not present

## 2016-04-18 NOTE — Progress Notes (Signed)
  Radiation Oncology         [phone]   Name: Wesley [name] MRN: [id number]   Date: 04/18/2016  DOB: Oct 03, 1940    Weekly Radiation Therapy Management    ICD-9-CM ICD-10-CM   1. Malignant neoplasm of prostate ([facility]) 185 C61     Current Dose: 49 Gy  Planned Dose:  75 Gy  Narrative The patient presents for routine under treatment assessment.  Weight and vitals stable. 27/40 treatrments completed Reports continued pain related to arthritis. Reports of nocturia x 3. Reports urinary urgency and frequency. Reports dysuria is milder than it has been. Denies hematuria. Reports continued hot flashes. Reports a spell of vertigo a few weeks ago. Reports passing small amounts of hard stool. Reports fatigue. Reports he is scheduled for his next Lupron shot on 05/13/16. He met with Dr. Alen Blew yesterday and was diagnosed with CLL; Dr. Alen Blew does not recommend any intervention currently, but will follow him for 6 months with lab work.   Set-up films were reviewed. The chart was checked.  Physical Findings  weight is 180 lb 12.8 oz (82 kg). His blood pressure is 134/72 and his pulse is 78. His respiration is 16 and oxygen saturation is 100%. . Weight essentially stable.  No significant changes.  Impression The patient is tolerating radiation.  Plan Continue treatment as planned. Recommended consideration of Miralax for constipation.         Sheral Apley Tammi Klippel, M.D.  This document serves as a record of services personally performed by Tyler Pita, MD and Shona Simpson, PA. It was created on his behalf by Maryla Morrow, a trained medical scribe. The creation of this record is based on the scribe's personal observations and the provider's statements to them. This document has been checked and approved by the attending provider.

## 2016-04-18 NOTE — Telephone Encounter (Signed)
-----   Message from Wyatt Portela, MD sent at 04/18/2016  4:03 PM EDT ----- Please let him know all of his tests are normal. No CLL but I still would like to recheck his counts in 6 months.

## 2016-04-18 NOTE — Telephone Encounter (Signed)
As noted below by Dr. Alen Blew, I left a message on patient's cell phone regarding message below. Instructed patient to call [phone] if he had any questions or concerns.

## 2016-04-18 NOTE — Progress Notes (Signed)
Weight and vitals stable. Reports continued pain related to effects of arthritis. Reports nocturia x 3. Reports urinary urgency and frequency. Reports dysuria is milder. Denies hematuria. Reports continued hot flashes. Reports a spell of vertigo a few weeks ago. Reports passing small amounts of hard stool. Reports fatigue. Reports he is scheduled for his next Lupron on November 21.    BP 134/72 (BP Location: Left Arm, Patient Position: Sitting, Cuff Size: Normal)   Pulse 78   Resp 16   Wt 179 lb 12.8 oz (81.6 kg)   SpO2 100%   BMI 26.55 kg/m  Wt Readings from Last 3 Encounters:  04/18/16 179 lb 12.8 oz (81.6 kg)  04/17/16 185 lb 1.6 oz (84 kg)  04/10/16 188 lb 3.2 oz (85.4 kg)

## 2016-04-21 ENCOUNTER — Ambulatory Visit
Admission: RE | Admit: 2016-04-21 | Discharge: 2016-04-21 | Disposition: A | Discharge status: Home / Self Care | Payer: Medicare Other | Source: Ambulatory Visit | Attending: Radiation Oncology | Admitting: Radiation Oncology

## 2016-04-21 DIAGNOSIS — C61 Malignant neoplasm of prostate: Secondary | ICD-10-CM | POA: Diagnosis not present

## 2016-04-21 LAB — FLOW CYTOMETRY

## 2016-04-22 ENCOUNTER — Encounter: Payer: Self-pay | Admitting: Oncology

## 2016-04-22 ENCOUNTER — Ambulatory Visit
Admission: RE | Admit: 2016-04-22 | Discharge: 2016-04-22 | Disposition: A | Discharge status: Home / Self Care | Payer: Medicare Other | Source: Ambulatory Visit | Attending: Radiation Oncology | Admitting: Radiation Oncology

## 2016-04-22 DIAGNOSIS — C61 Malignant neoplasm of prostate: Secondary | ICD-10-CM | POA: Diagnosis not present

## 2016-04-22 NOTE — Progress Notes (Signed)
This pt will be coming from [place] for radiation so I reached out to [place] to inquire if he would like to apply for the [employer] to assist with gas cards.

## 2016-04-23 ENCOUNTER — Ambulatory Visit
Admission: RE | Admit: 2016-04-23 | Discharge: 2016-04-23 | Disposition: A | Discharge status: Home / Self Care | Payer: Medicare Other | Source: Ambulatory Visit | Attending: Radiation Oncology | Admitting: Radiation Oncology

## 2016-04-23 DIAGNOSIS — C61 Malignant neoplasm of prostate: Secondary | ICD-10-CM | POA: Diagnosis not present

## 2016-04-24 ENCOUNTER — Ambulatory Visit
Admission: RE | Admit: 2016-04-24 | Discharge: 2016-04-24 | Disposition: A | Discharge status: Home / Self Care | Payer: Medicare Other | Source: Ambulatory Visit | Attending: Radiation Oncology | Admitting: Radiation Oncology

## 2016-04-24 VITALS — BP 138/73 | HR 61 | Wt 188.0 lb

## 2016-04-24 DIAGNOSIS — C61 Malignant neoplasm of prostate: Secondary | ICD-10-CM

## 2016-04-24 NOTE — Progress Notes (Signed)
  Radiation Oncology         [phone]   Name: Wesley [name] MRN: [id number]   Date: 04/24/2016  DOB: 1940-07-09    Weekly Radiation Therapy Management    ICD-9-CM ICD-10-CM   1. Malignant neoplasm of prostate ([facility]) 185 C61     Current Dose: 57 Gy  Planned Dose:  75 Gy  Narrative The patient presents for routine under treatment assessment.  Weight and vitals stable. 31/40 fractions completed. Reports continued pain relate to effects of arthritis. He reports difficulty walking due to left hip and leg pain. The patient reports nocturia x 2-3. He reports urinary urgency and frequency. Reports dysuria is milder. He denies hematuria. The patient reports continued hot flashes. Reports constipation. Reports fatigue. The patient reports he is scheduled for his next Lupron shot on November 21.   Set-up films were reviewed. The chart was checked.  Physical Findings  weight is 188 lb (85.3 kg). His blood pressure is 138/73 and his pulse is 61. . Weight essentially stable.  No significant changes.  Impression The patient is tolerating radiation.  Plan Continue treatment as planned.         Sheral Apley Tammi Klippel, M.D.  This document serves as a record of services personally performed by Tyler Pita, MD and Shona Simpson, PA. It was created on his behalf by Maryla Morrow, a trained medical scribe. The creation of this record is based on the scribe's personal observations and the provider's statements to them. This document has been checked and approved by the attending provider.

## 2016-04-24 NOTE — Progress Notes (Addendum)
Weight and vitals stable. Reports continued pain related to effects of arthritis. Reports difficulty walking due to left hip and leg pain. Reports nocturia x 2-3. Reports urinary urgency and frequency. Reports dysuria is milder. Denies hematuria. Reports continued hot flashes. Reports constipation. Reports fatigue. Reports he is scheduled for his next Lupron on November 21.   BP 138/73 (BP Location: Left Arm, Patient Position: Sitting, Cuff Size: Normal)   Pulse 61   Wt 188 lb (85.3 kg)   BMI 27.76 kg/m  Wt Readings from Last 3 Encounters:  04/24/16 188 lb (85.3 kg)  04/18/16 180 lb 12.8 oz (82 kg)  04/17/16 185 lb 1.6 oz (84 kg)

## 2016-04-25 ENCOUNTER — Ambulatory Visit
Admission: RE | Admit: 2016-04-25 | Discharge: 2016-04-25 | Disposition: A | Discharge status: Home / Self Care | Payer: Medicare Other | Source: Ambulatory Visit | Attending: Radiation Oncology | Admitting: Radiation Oncology

## 2016-04-25 DIAGNOSIS — C61 Malignant neoplasm of prostate: Secondary | ICD-10-CM | POA: Diagnosis not present

## 2016-04-28 ENCOUNTER — Ambulatory Visit
Admission: RE | Admit: 2016-04-28 | Discharge: 2016-04-28 | Disposition: A | Discharge status: Home / Self Care | Payer: Medicare Other | Source: Ambulatory Visit | Attending: Radiation Oncology | Admitting: Radiation Oncology

## 2016-04-28 DIAGNOSIS — C61 Malignant neoplasm of prostate: Secondary | ICD-10-CM | POA: Diagnosis not present

## 2016-04-29 ENCOUNTER — Ambulatory Visit
Admission: RE | Admit: 2016-04-29 | Discharge: 2016-04-29 | Disposition: A | Discharge status: Home / Self Care | Payer: Medicare Other | Source: Ambulatory Visit | Attending: Radiation Oncology | Admitting: Radiation Oncology

## 2016-04-29 ENCOUNTER — Encounter: Payer: Self-pay | Admitting: Medical Oncology

## 2016-04-29 DIAGNOSIS — C61 Malignant neoplasm of prostate: Secondary | ICD-10-CM | POA: Diagnosis not present

## 2016-04-30 ENCOUNTER — Ambulatory Visit
Admission: RE | Admit: 2016-04-30 | Discharge: 2016-04-30 | Disposition: A | Discharge status: Home / Self Care | Payer: Medicare Other | Source: Ambulatory Visit | Attending: Radiation Oncology | Admitting: Radiation Oncology

## 2016-04-30 DIAGNOSIS — C61 Malignant neoplasm of prostate: Secondary | ICD-10-CM | POA: Diagnosis not present

## 2016-05-01 ENCOUNTER — Ambulatory Visit
Admission: RE | Admit: 2016-05-01 | Discharge: 2016-05-01 | Disposition: A | Discharge status: Home / Self Care | Payer: Medicare Other | Source: Ambulatory Visit | Attending: Radiation Oncology | Admitting: Radiation Oncology

## 2016-05-01 VITALS — BP 146/77 | HR 83 | Resp 16 | Wt 187.0 lb

## 2016-05-01 DIAGNOSIS — C61 Malignant neoplasm of prostate: Secondary | ICD-10-CM

## 2016-05-01 NOTE — Progress Notes (Signed)
  Radiation Oncology         [phone]   Name: Wesley [name] MRN: [id number]   Date: 05/01/2016  DOB: [birth year]    Weekly Radiation Therapy Management    ICD-9-CM ICD-10-CM   1. Malignant neoplasm of prostate ([facility]) 185 C61     Current Dose: 67 Gy  Planned Dose:  75 Gy  Narrative The patient presents for routine under treatment assessment.  Weight and vitals stable. Reports continued pain related to effects of arthritis. Reports difficulty walking due to left hip and leg pain. Reports nocturia x1. Reports urinary urgency and frequency. Reports dysuria is milder. Denies hematuria. Reports continued hot flashes. Reports constipation. Reports fatigue. Reports he is scheduled for his next Lupron on November 21.   Set-up films were reviewed. The chart was checked.  Physical Findings  weight is 187 lb (84.8 kg). His blood pressure is 146/77 (abnormal) and his pulse is 83. His respiration is 16 and oxygen saturation is 100%. . Weight essentially stable.  No significant changes.  Impression The patient is tolerating radiation.  Plan Continue treatment as planned. He is scheduled to complete treatment next Wednesday. He will return for follow up in 1 month.         Sheral Apley Tammi Klippel, M.D.  This document serves as a record of services personally performed by Tyler Pita, MD. It was created on his behalf by Arlyce Harman, a trained medical scribe. The creation of this record is based on the scribe's personal observations and the provider's statements to them. This document has been checked and approved by the attending provider.

## 2016-05-01 NOTE — Progress Notes (Signed)
Weight and vitals stable. Reports continued pain related to effects of arthritis. Reports difficulty walking due to left hip and leg pain. Reports nocturia x 1. Reports urinary urgency and frequency. Reports dysuria is milder. Denies hematuria. Reports continued hot flashes. Reports constipation. Suggested miralax daily. Reports fatigue. Reports he is scheduled for his next Lupron on November 21.   BP (!) 146/77 (BP Location: Left Arm, Patient Position: Sitting, Cuff Size: Normal)   Pulse 83   Resp 16   Wt 187 lb (84.8 kg)   SpO2 100%   BMI 27.62 kg/m  Wt Readings from Last 3 Encounters:  05/01/16 187 lb (84.8 kg)  04/24/16 188 lb (85.3 kg)  04/18/16 180 lb 12.8 oz (82 kg)

## 2016-05-02 ENCOUNTER — Ambulatory Visit
Admission: RE | Admit: 2016-05-02 | Discharge: 2016-05-02 | Disposition: A | Discharge status: Home / Self Care | Payer: Medicare Other | Source: Ambulatory Visit | Attending: Radiation Oncology | Admitting: Radiation Oncology

## 2016-05-02 DIAGNOSIS — C61 Malignant neoplasm of prostate: Secondary | ICD-10-CM | POA: Diagnosis not present

## 2016-05-05 ENCOUNTER — Ambulatory Visit
Admission: RE | Admit: 2016-05-05 | Discharge: 2016-05-05 | Disposition: A | Discharge status: Home / Self Care | Payer: Medicare Other | Source: Ambulatory Visit | Attending: Radiation Oncology | Admitting: Radiation Oncology

## 2016-05-05 DIAGNOSIS — C61 Malignant neoplasm of prostate: Secondary | ICD-10-CM | POA: Diagnosis not present

## 2016-05-06 ENCOUNTER — Ambulatory Visit
Admission: RE | Admit: 2016-05-06 | Discharge: 2016-05-06 | Disposition: A | Discharge status: Home / Self Care | Payer: Medicare Other | Source: Ambulatory Visit | Attending: Radiation Oncology | Admitting: Radiation Oncology

## 2016-05-06 DIAGNOSIS — C61 Malignant neoplasm of prostate: Secondary | ICD-10-CM | POA: Diagnosis not present

## 2016-05-07 ENCOUNTER — Ambulatory Visit
Admission: RE | Admit: 2016-05-07 | Discharge: 2016-05-07 | Disposition: A | Discharge status: Home / Self Care | Payer: Medicare Other | Source: Ambulatory Visit | Attending: Radiation Oncology | Admitting: Radiation Oncology

## 2016-05-07 ENCOUNTER — Encounter: Payer: Self-pay | Admitting: Medical Oncology

## 2016-05-07 ENCOUNTER — Encounter: Payer: Self-pay | Admitting: Radiation Oncology

## 2016-05-07 DIAGNOSIS — C61 Malignant neoplasm of prostate: Secondary | ICD-10-CM | POA: Diagnosis not present

## 2016-05-11 NOTE — Progress Notes (Signed)
  Radiation Oncology         [phone] ________________________________  Name: Wesley [name] MRN: [id number]  Date: 05/07/2016  DOB: Oct 27, 1940  End of Treatment Note  Diagnosis: 75 y.o. gentleman with stage T1c adenocarcinoma of the prostate with a Gleason's score of 4+4 and a PSA of 13.76  Indication for treatment:  Curative, Definitive Radiotherapy       Radiation treatment dates: 03/13/16 - 05/07/16  Site/dose:  1. The prostate, seminal vesicles, and pelvic lymph nodes were initially treated to 45 Gy in 25 fractions of 1.8 Gy. 2. The prostate only was boosted to 75 Gy with 15 additional fractions of 2.0 Gy.  Beams/energy:  1. The prostate, seminal vesicles, and pelvic lymph nodes were initially treated using helical intensity modulated radiotherapy delivering 6 megavolt photons. Image guidance was performed with megavoltage CT studies prior to each fraction. He was immobilized with a body fix lower extremity mold.  2. The prostate only was boosted using helical intensity modulated radiotherapy delivering 6 megavolt photons. Image guidance was performed with megavoltage CT studies prior to each fraction. He was immobilized with a body fix lower extremity mold.  Narrative: The patient tolerated radiation treatment relatively well.   The patient experienced some minor urinary irritation and modest fatigue. He also had continued hot flashes (from Lupron) and constipation towards the end of treatment.  Plan: The patient has completed radiation treatment. He will return to radiation oncology clinic for routine followup in one month. I advised him to call or return sooner if he has any questions or concerns related to his recovery or treatment. ________________________________  Sheral Apley. Tammi Klippel, M.D.  This document serves as a record of services personally performed by Tyler Pita, MD. It was created on his behalf by Darcus Austin, a trained medical scribe. The creation of  this record is based on the scribe's personal observations and the provider's statements to them. This document has been checked and approved by the attending provider.

## 2016-06-06 ENCOUNTER — Encounter: Payer: Self-pay | Admitting: Physician Assistant

## 2016-06-06 ENCOUNTER — Ambulatory Visit (INDEPENDENT_AMBULATORY_CARE_PROVIDER_SITE_OTHER): Payer: Medicare Other | Admitting: Physician Assistant

## 2016-06-06 VITALS — BP 128/72 | HR 80 | Ht 69.0 in | Wt 185.2 lb

## 2016-06-06 DIAGNOSIS — Z1212 Encounter for screening for malignant neoplasm of rectum: Secondary | ICD-10-CM | POA: Diagnosis not present

## 2016-06-06 DIAGNOSIS — Z1211 Encounter for screening for malignant neoplasm of colon: Secondary | ICD-10-CM

## 2016-06-06 DIAGNOSIS — Z86010 Personal history of colonic polyps: Secondary | ICD-10-CM

## 2016-06-06 MED ORDER — NA SULFATE-K SULFATE-MG SULF 17.5-3.13-1.6 GM/177ML PO SOLN
1.0000 | Freq: Once | ORAL | 0 refills | Status: AC
Start: 2016-06-06 — End: 2016-06-06

## 2016-06-06 NOTE — Progress Notes (Signed)
Agree with assessment and plan. Small adenoma removed on colonoscopy > 5 yrs ago, due for surveillance at this time.

## 2016-06-06 NOTE — Patient Instructions (Addendum)
You have been scheduled for a colonoscopy. Please follow written instructions given to you at your visit today.  Please pick up your prep supplies at the pharmacy within the next 1-3 days. [facility] [address], [place], [place] If you use inhalers (even only as needed), please bring them with you on the day of your procedure. Your physician has requested that you go to www.startemmi.com and enter the access code given to you at your visit today. This web site gives a general overview about your procedure. However, you should still follow specific instructions given to you by our office regarding your preparation for the procedure.

## 2016-06-06 NOTE — Progress Notes (Signed)
Chief Complaint: Screening colorectal cancer, h/o polyps  HPI:  Wesley [name] is a 75 year old Caucasian male with a history of chronic back pain, GERD, hyperlipidemia, OSA and stroke, most recent echo done for CHF shows an ejection fraction of 55% on 07/01/12 who presents to clinic today for a consult of a screening colonoscopy.    The patient previously followed in the clinic with Dr. Olevia Perches, his last colonoscopy was completed 02/07/11 with findings of a sessile polyp and recommendations were made for repeat in 5 years. He recently receieved a letter from Dr. Doyne Keel in the mail telling him it was time for a screening colonoscopy.   Patient tells me today that he was recently diagnosed with prostate cancer and started radiation on October 5, he completed this November 15 and has a follow-up with his oncologist on the 26th of this month. He tells me does see occasional bright red blood when he is wiping but blames this on hemorrhoids. He has also become somewhat constipated after radiation. He typically uses Senokot or magnesium oxide with good results. He has lost about 30 pounds in the past couple of years.   Patient denies fever, chills, fatigue, anorexia, heartburn, reflux or abdominal pain.  Past Medical History:  Diagnosis Date  . Allergic rhinitis   . Chronic back pain   . Coronary atherosclerosis of autologous vein bypass graft   . Essential hypertension, benign    pt denies 04/07/14  . GERD (gastroesophageal reflux disease)   . Hyperlipidemia   . Intermittent vertigo   . Labral tear of shoulder    w/ fracture  . Macular degeneration   . OSA (obstructive sleep apnea)    sleep study 06/03/2012 - AHI 54.74/hr during total sleep, AHI 49.66/hr during REM  . Prostate cancer (Wesley [name])   . S/P CABG x 4 2005  . Stroke (Wesley [name])   . Synovitis and tenosynovitis, unspecified   . Tubular adenoma of colon   . Unspecified vitamin D deficiency     Past Surgical History:  Procedure Laterality  Date  . CARDIAC CATHETERIZATION  02/01/2004   L main with 60% prox stenosis, LAD calcified in prox section with 95% ostial lesion, diffusion 50-60% disease in mid LAD with focal 90% mid LAD stenosis; 1st diagonal coarsely irregular with up to 30% stenosis in prox & mid segments; L Cfx with 3 OMs, AV groove Cfx with 40-50% stenosis; 3rd OM with 50% prox stenosis; RCA with PDA & PLA, PLA with 95% osital lesion (Dr. Gerrie Nordmann)  . CATARACT EXTRACTION, BILATERAL    . COLONOSCOPY    . CORONARY ARTERY BYPASS GRAFT  02/02/2004   LIMA to DX1, RIMA to RCA, SVG to distal LAD, SVG to Cfx marginal (Dr. Remus Loffler)  . gold seed marker placement/prostate  02/28/2016  . [place]  2002  . NM MYOCAR PERF WALL MOTION  06/2012   lexiscan myoview - normal low risk study, EF 47%  . 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 TONSILLECTOMY AND ADENOIDECTOMY    . TOTAL KNEE ARTHROPLASTY Left    Dr. Marry Guan [facility], [place])  . TRANSTHORACIC ECHOCARDIOGRAM  06/2012   EF 50-55%, mild MR; LA mild-mod dilated; RVSP increased  . UPPER GASTROINTESTINAL ENDOSCOPY      Current Outpatient Prescriptions  Medication Sig Dispense Refill  . Ascorbic Acid (VITAMIN C) 1000 MG tablet Take 2,000 mg by mouth daily.     Marland Kitchen aspirin 81 MG  EC tablet Take 81 mg by mouth 2 (two) times daily.     Marland Kitchen aspirin-acetaminophen-caffeine (EXCEDRIN MIGRAINE) 250-250-65 MG per tablet Take 1 tablet by mouth every 6 (six) hours as needed.      Marland Kitchen atorvastatin (LIPITOR) 80 MG tablet Take 80 mg by mouth daily.      . calcium elemental as carbonate (BARIATRIC TUMS ULTRA) 400 MG chewable tablet Chew by mouth as needed.     . cetirizine (ZYRTEC) 10 MG tablet Take by mouth as needed.     . Cholecalciferol (VITAMIN D-3) 1000 UNITS CAPS Take 2,000 Units by mouth daily.    . Coenzyme Q10 50 MG CAPS Take 200 mg by mouth daily.     Marland Kitchen CRANBERRY PO Take 84 mg by mouth daily.    . cyanocobalamin 2000 MCG tablet  Take 1,000 mcg by mouth daily.     . Echinacea 450 MG CAPS Take by mouth daily.    . fluticasone (FLONASE) 50 MCG/ACT nasal spray Place into the nose as needed.     Marland Kitchen GARLIC PO Take [id number] mg by mouth daily.    . Ginkgo 60 MG TABS Take by mouth daily.    Marland Kitchen glucosamine-chondroitin 500-400 MG tablet Take 1 tablet by mouth daily.     . Ibuprofen-Diphenhydramine Cit (ADVIL PM PO) Take by mouth as needed.    . Loratadine (CLARITIN PO) Take by mouth daily as needed.    Marland Kitchen LUMIGAN 0.01 % SOLN Place 1 drop into both eyes daily.    . magnesium oxide (MAG-OX) 400 MG tablet Take 400 mg by mouth daily.    . meloxicam (MOBIC) 15 MG tablet TK 1 T PO QD  2  . Methylsulfonylmethane (MSM PO) Take 1 capsule by mouth daily.      . Multiple Vitamin (MULTIVITAMIN) tablet Take 1 tablet by mouth daily.      . Multiple Vitamins-Minerals (ICAPS AREDS 2 PO) Take by mouth daily.    . Omega-3 Fatty Acids (FISH OIL PO) Take 400 mg by mouth daily. Fish, flax, borage oil with omega    . pantoprazole (PROTONIX) 40 MG tablet TK 1 T PO ONCE A DAY.  7  . selenium 50 MCG TABS tablet Take 50 mcg by mouth daily. Pt taking 200mg     . tamsulosin (FLOMAX) 0.4 MG CAPS capsule Take 0.4 mg by mouth daily.    . traMADol (ULTRAM) 50 MG tablet TAKE 1 TABLET BY MOUTH EVERY 4 TO 6 HOURS AS NEEDED 90 tablet 0  . vitamin E 400 UNIT capsule Take 400 Units by mouth daily.    Marland Kitchen zinc gluconate 50 MG tablet Take 100 mg by mouth daily.      No current facility-administered medications for this visit.     Allergies as of 06/06/2016 - Review Complete 06/06/2016  Allergen Reaction Noted  . Amoxicillin Hives and Nausea Only 10/14/2010    Family History  Problem Relation Age of Onset  . Heart disease Mother   . Parkinsonism Father   . Diabetes Paternal Grandfather   . Stomach cancer Cousin   . Colon cancer Neg Hx   . Esophageal cancer Neg Hx   . Rectal cancer Neg Hx   . Liver cancer Neg Hx     Social History   Social History  .  Marital status: Single    Spouse name: N/A  . Number of children: 3  . Years of education: N/A   Occupational History  . attorney    Social  History Main Topics  . Smoking status: Never Smoker  . Smokeless tobacco: Never Used  . Alcohol use Yes     Comment: occasional beer every 3-4 week  . Drug use: No  . Sexual activity: Not Currently   Other Topics Concern  . Not on file   Social History Narrative  . No narrative on file    Review of Systems:    Constitutional: No weight loss, fever, chills, weakness or fatigue Cardiovascular: No chest pain Respiratory: No SOB  Gastrointestinal: See HPI and otherwise negative   Physical Exam:  Vital signs: BP 128/72   Pulse 80   Ht 5\' 9"  (1.753 m)   Wt 185 lb 4 oz (84 kg)   BMI 27.36 kg/m   Constitutional:   Pleasant Caucasian male appears to be in NAD, Well developed, Well nourished, alert and cooperative Respiratory: Respirations even and unlabored. Lungs clear to auscultation bilaterally.   No wheezes, crackles, or rhonchi.  Cardiovascular: Normal S1, S2. No MRG. Regular rate and rhythm. No peripheral edema, cyanosis or pallor.  Gastrointestinal:  Soft, nondistended, nontender. No rebound or guarding. Normal bowel sounds. No appreciable masses or hepatomegaly. Psychiatric: Demonstrates good judgement and reason without abnormal affect or behaviors.  RECENT LABS: CBC    Component Value Date/Time   WBC 7.1 04/17/2016 1131   WBC 10.1 04/07/2014 1455   RBC 4.36 04/17/2016 1131   RBC 5.14 04/07/2014 1455   HGB 13.3 04/17/2016 1131   HCT 39.3 04/17/2016 1131   PLT 213 04/17/2016 1131   MCV 90.1 04/17/2016 1131   MCH 30.5 04/17/2016 1131   MCHC 33.8 04/17/2016 1131   MCHC 32.5 04/07/2014 1455   RDW 13.1 04/17/2016 1131   LYMPHSABS 2.0 04/17/2016 1131   MONOABS 0.8 04/17/2016 1131   EOSABS 0.6 (H) 04/17/2016 1131   BASOSABS 0.0 04/17/2016 1131    CMP     Component Value Date/Time   NA 142 04/17/2016 1131   K 4.4  04/17/2016 1131   CL 109 (H) 11/10/2012 0527   CO2 27 04/17/2016 1131   GLUCOSE 113 04/17/2016 1131   BUN 22.6 04/17/2016 1131   CREATININE 0.8 04/17/2016 1131   CALCIUM 9.8 04/17/2016 1131   PROT 6.2 (L) 04/17/2016 1131   ALBUMIN 3.5 04/17/2016 1131   AST 28 04/17/2016 1131   ALT 26 04/17/2016 1131   ALKPHOS 76 04/17/2016 1131   BILITOT 0.41 04/17/2016 1131   GFRNONAA >60 11/10/2012 0527   GFRAA >60 11/10/2012 0527    Assessment: 1. History of colon polyps: Last colonoscopy 2012 recommendations repeat in 5 years 2. Screening colorectal cancer: As above  Plan: 1. Scheduled screening colonoscopy with Dr. Havery Moros in the [facility]. Discussed risks, benefits, limitations and alternatives and the patient agrees to proceed. 2. Discussed wit patient that Dr. Havery Moros can evaluate further the occasional bright red blood that he has been seeing at time of colonoscopy 3. Patient to follow in clinic with Dr. Havery Moros per recommendations after time of procedure  Ellouise Newer, PA-C [facility] 06/06/2016, 3:18 PM  Cc: [name], MD

## 2016-06-17 ENCOUNTER — Encounter: Payer: Self-pay | Admitting: Radiation Oncology

## 2016-06-17 ENCOUNTER — Ambulatory Visit
Admission: RE | Admit: 2016-06-17 | Discharge: 2016-06-17 | Disposition: A | Discharge status: Home / Self Care | Payer: Medicare Other | Source: Ambulatory Visit | Attending: Radiation Oncology | Admitting: Radiation Oncology

## 2016-06-17 VITALS — BP 133/75 | HR 69 | Temp 98.5°F | Resp 18 | Ht 69.0 in | Wt 187.0 lb

## 2016-06-17 DIAGNOSIS — Z7982 Long term (current) use of aspirin: Secondary | ICD-10-CM | POA: Diagnosis not present

## 2016-06-17 DIAGNOSIS — Z5189 Encounter for other specified aftercare: Secondary | ICD-10-CM | POA: Diagnosis not present

## 2016-06-17 DIAGNOSIS — Z923 Personal history of irradiation: Secondary | ICD-10-CM | POA: Insufficient documentation

## 2016-06-17 DIAGNOSIS — Z79899 Other long term (current) drug therapy: Secondary | ICD-10-CM | POA: Diagnosis not present

## 2016-06-17 DIAGNOSIS — Z880 Allergy status to penicillin: Secondary | ICD-10-CM | POA: Insufficient documentation

## 2016-06-17 DIAGNOSIS — C61 Malignant neoplasm of prostate: Secondary | ICD-10-CM | POA: Diagnosis present

## 2016-06-17 NOTE — Progress Notes (Signed)
[name] 75 year old man  year old male is here for a one month follow up appointment for metastatic prostate cancer.  PAIN: He is currently  Having pain legs and back 7/10 taking Tramadol. URINARY: He denies  urinary frequency,urinary urgency, urinary retention. Pt states does not get up at night to  Urinate he goes before going to bed for the night. BOWEL: He reports a  bowel movement everyday tends to lean towards constipation. Appetite is good. Having some fatigue stated he does not get much sleep. Wt Readings from Last 3 Encounters:  06/17/16 187 lb (84.8 kg)  06/06/16 185 lb 4 oz (84 kg)  05/01/16 187 lb (84.8 kg)  BP 133/75   Pulse 69   Temp 98.5 F (36.9 C) (Oral)   Resp 18   Ht 5\' 9"  (1.753 m)   Wt 187 lb (84.8 kg)   SpO2 95%   BMI 27.62 kg/m

## 2016-06-18 NOTE — Progress Notes (Signed)
Radiation Oncology         (336) [phone] ________________________________  Name: Wesley [name] MRN: [id number]  Date: 06/17/2016  DOB: Nov 02, 1940  Post Treatment Note  CC: BERT Odetta Pink, MD  Kathie Rhodes, MD  Diagnosis: 75 y.o. gentleman with stage T1c adenocarcinoma of the prostate with a Gleason's score of 4+4 and a PSA of 13.76.  Interval Since Last Radiation:  6 weeks   03/13/16 - 05/07/16: 1. The prostate, seminal vesicles, and pelvic lymph nodes were initially treated to 45 Gy in 25 fractions of 1.8 Gy. 2. The prostate only was boosted to 75 Gy with 15 additional fractions of 2.0 Gy.  Narrative:  The patient returns today for routine follow-up.  He tolerated radiotherapy well without disruption.                               On review of systems, the patient states he's feeling well and reports normal bowel and bladder function. He continues to have low back pain which is chronic and takes tramadol for this. No other complaints are noted.  ALLERGIES:  is allergic to amoxicillin.  Meds: Current Outpatient Prescriptions  Medication Sig Dispense Refill  . Ascorbic Acid (VITAMIN C) 1000 MG tablet Take 2,000 mg by mouth daily.     Marland Kitchen aspirin 81 MG EC tablet Take 81 mg by mouth 2 (two) times daily.     Marland Kitchen aspirin-acetaminophen-caffeine (EXCEDRIN MIGRAINE) 250-250-65 MG per tablet Take 1 tablet by mouth every 6 (six) hours as needed.      Marland Kitchen atorvastatin (LIPITOR) 80 MG tablet Take 80 mg by mouth daily.      . calcium elemental as carbonate (BARIATRIC TUMS ULTRA) 400 MG chewable tablet Chew by mouth as needed.     . cetirizine (ZYRTEC) 10 MG tablet Take by mouth as needed.     . Cholecalciferol (VITAMIN D-3) 1000 UNITS CAPS Take 2,000 Units by mouth daily.    . Coenzyme Q10 50 MG CAPS Take 200 mg by mouth daily.     Marland Kitchen CRANBERRY PO Take 84 mg by mouth daily.    . cyanocobalamin 2000 MCG tablet Take 1,000 mcg by mouth daily.     . Echinacea 450 MG CAPS Take by mouth daily.     Marland Kitchen GARLIC PO Take [id number] mg by mouth daily.    . Ginkgo 60 MG TABS Take by mouth daily.    Marland Kitchen glucosamine-chondroitin 500-400 MG tablet Take 1 tablet by mouth daily.     Marland Kitchen LUMIGAN 0.01 % SOLN Place 1 drop into both eyes daily.    . magnesium oxide (MAG-OX) 400 MG tablet Take 400 mg by mouth daily.    . meloxicam (MOBIC) 15 MG tablet TK 1 T PO QD  2  . Methylsulfonylmethane (MSM PO) Take 1 capsule by mouth daily.      . Multiple Vitamin (MULTIVITAMIN) tablet Take 1 tablet by mouth daily.      . Multiple Vitamins-Minerals (ICAPS AREDS 2 PO) Take by mouth daily.    . pantoprazole (PROTONIX) 40 MG tablet TK 1 T PO ONCE A DAY.  7  . selenium 50 MCG TABS tablet Take 50 mcg by mouth daily. Pt taking 200mg     . tamsulosin (FLOMAX) 0.4 MG CAPS capsule Take 0.4 mg by mouth daily.    . traMADol (ULTRAM) 50 MG tablet TAKE 1 TABLET BY MOUTH EVERY 4 TO 6 HOURS AS NEEDED 90  tablet 0  . vitamin E 400 UNIT capsule Take 400 Units by mouth daily.    Marland Kitchen zinc gluconate 50 MG tablet Take 100 mg by mouth daily.     . fluticasone (FLONASE) 50 MCG/ACT nasal spray Place into the nose as needed.     . Ibuprofen-Diphenhydramine Cit (ADVIL PM PO) Take by mouth as needed.    . Loratadine (CLARITIN PO) Take by mouth daily as needed.    . Omega-3 Fatty Acids (FISH OIL PO) Take 400 mg by mouth daily. Fish, flax, borage oil with omega     No current facility-administered medications for this encounter.     Physical Findings:  height is 5\' 9"  (1.753 m) and weight is 187 lb (84.8 kg). His oral temperature is 98.5 F (36.9 C). His blood pressure is 133/75 and his pulse is 69. His respiration is 18 and oxygen saturation is 95%.  In general this is a well appearing caucasian male in no acute distress. He's alert and oriented x4 and appropriate throughout the examination. Cardiopulmonary assessment is negative for acute distress and he exhibits normal effort.   Lab Findings: Lab Results  Component Value Date   WBC 7.1  04/17/2016   HGB 13.3 04/17/2016   HCT 39.3 04/17/2016   MCV 90.1 04/17/2016   PLT 213 04/17/2016     Radiographic Findings: No results found.  Impression/Plan: 1. 75 y.o. gentleman with stage T1c adenocarcinoma of the prostate with a Gleason's score of 4+4 and a PSA of 13.76. The patient received his most recent Lupron injection in December 2017. He understands the role of ADT for a total of 2 years, which keeps his PSA low, and once his Lupron is no longer active, his testosterone should return and his PSA be detectable again. We will follow along with his PSAs as [facility] will copy [place] with his records. We would be happy to see him back as needed moving forward.      Carola Rhine, PAC

## 2016-06-30 ENCOUNTER — Ambulatory Visit (AMBULATORY_SURGERY_CENTER): Payer: Medicare Other | Admitting: Gastroenterology

## 2016-06-30 ENCOUNTER — Encounter: Payer: Self-pay | Admitting: Gastroenterology

## 2016-06-30 VITALS — BP 134/64 | HR 63 | Temp 97.1°F | Resp 18 | Ht 69.0 in | Wt 185.0 lb

## 2016-06-30 DIAGNOSIS — K921 Melena: Secondary | ICD-10-CM | POA: Diagnosis not present

## 2016-06-30 DIAGNOSIS — K6389 Other specified diseases of intestine: Secondary | ICD-10-CM | POA: Diagnosis not present

## 2016-06-30 DIAGNOSIS — K621 Rectal polyp: Secondary | ICD-10-CM | POA: Diagnosis not present

## 2016-06-30 DIAGNOSIS — K6289 Other specified diseases of anus and rectum: Secondary | ICD-10-CM

## 2016-06-30 DIAGNOSIS — K629 Disease of anus and rectum, unspecified: Secondary | ICD-10-CM

## 2016-06-30 DIAGNOSIS — Z86010 Personal history of colonic polyps: Secondary | ICD-10-CM | POA: Diagnosis not present

## 2016-06-30 MED ORDER — SODIUM CHLORIDE 0.9 % IV SOLN: 500.0000 mL | INTRAVENOUS | Status: DC

## 2016-06-30 NOTE — Progress Notes (Signed)
Report to PACU, RN, vss, BBS= Clear.  

## 2016-06-30 NOTE — Op Note (Signed)
[facility] Patient Name: Wesley [name] Procedure Date: 06/30/2016 3:37 PM MRN: [id number] Endoscopist: Remo Lipps P. Armbruster MD, MD Age: 76 Referring MD:  Date of Birth: January 29, 1941 Gender: Male Account #: [id number] Procedure:                Colonoscopy Indications:              Surveillance: Personal history of adenomatous                            polyps on last colonoscopy 5 years ago,                            intermittent rectal bleeding, history of prostate                            cancer s/p radiation therapy Medicines:                Monitored Anesthesia Care Procedure:                Pre-Anesthesia Assessment:                           - Prior to the procedure, a History and Physical                            was performed, and patient medications and                            allergies were reviewed. The patient's tolerance of                            previous anesthesia was also reviewed. The risks                            and benefits of the procedure and the sedation                            options and risks were discussed with the patient.                            All questions were answered, and informed consent                            was obtained. Prior Anticoagulants: The patient has                            taken aspirin, last dose was 1 day prior to                            procedure. ASA Grade Assessment: III - A patient                            with severe systemic disease. After reviewing the  risks and benefits, the patient was deemed in                            satisfactory condition to undergo the procedure.                           After obtaining informed consent, the colonoscope                            was passed under direct vision. Throughout the                            procedure, the patient's blood pressure, pulse, and                            oxygen saturations were monitored  continuously. The                            Model CF-HQ190L [id number]) scope was introduced                            through the anus and advanced to the the cecum,                            identified by appendiceal orifice and ileocecal                            valve. The colonoscopy was performed without                            difficulty. The patient tolerated the procedure                            well. The quality of the bowel preparation was                            good. The ileocecal valve, appendiceal orifice, and                            rectum were photographed. Scope In: 3:44:52 PM Scope Out: 4:07:49 PM Scope Withdrawal Time: 0 hours 17 minutes 33 seconds  Total Procedure Duration: 0 hours 22 minutes 57 seconds  Findings:                 The perianal and digital rectal examinations were                            normal.                           Multiple medium-mouthed diverticula were found in                            the left colon.  A scar was found in the distal rectum at site of                            prior hemorrhoid surgery, with moderately sized                            internal hemorrhoids. At the site of a scar was                            subtle nodule along a hemorrhoid which I suspect is                            likely benign hyperplastic changes. Biopsies were                            taken with a cold forceps for histology to ensure                            no adenoma.                           Internal hemorrhoids were found during retroflexion.                           The colon was tortous with some looping in the left                            colon. The exam was otherwise without abnormality. Complications:            No immediate complications. Estimated blood loss:                            Minimal. Estimated Blood Loss:     Estimated blood loss was minimal. Impression:               -  Diverticulosis in the left colon.                           - Scar in the distal rectum at site of prior                            hemorroid surgery, one area of focal nodule,                            suspect hyperplastic changes. Biopsied.                           - Internal hemorrhoids.                           - Tortous colon                           - The examination was otherwise normal. Recommendation:           - Patient  has a contact number available for                            emergencies. The signs and symptoms of potential                            delayed complications were discussed with the                            patient. Return to normal activities tomorrow.                            Written discharge instructions were provided to the                            patient.                           - Resume previous diet.                           - Continue present medications.                           - Await pathology results. If pathology shows                            benign non-precancerous changes, no further                            surveillance colonoscopy is needed.                           - Follow up in the clinic as needed for hemorrhoid                            therapy if bleeding symptoms persist. Remo Lipps P. Armbruster MD, MD 06/30/2016 4:15:34 PM This report has been signed electronically.

## 2016-06-30 NOTE — Progress Notes (Signed)
Called to room to assist during endoscopic procedure.  Patient ID and intended procedure confirmed with present staff. Received instructions for my participation in the procedure from the performing physician.  

## 2016-06-30 NOTE — Patient Instructions (Signed)
YOU HAD AN ENDOSCOPIC PROCEDURE TODAY AT [facility]:   Refer to the procedure report that was given to you for any specific questions about what was found during the examination.  If the procedure report does not answer your questions, please call your gastroenterologist to clarify.  If you requested that your care partner not be given the details of your procedure findings, then the procedure report has been included in a sealed envelope for you to review at your convenience later.  YOU SHOULD EXPECT: Some feelings of bloating in the abdomen. Passage of more gas than usual.  Walking can help get rid of the air that was put into your GI tract during the procedure and reduce the bloating. If you had a lower endoscopy (such as a colonoscopy or flexible sigmoidoscopy) you may notice spotting of blood in your stool or on the toilet paper. If you underwent a bowel prep for your procedure, you may not have a normal bowel movement for a few days.  Please Note:  You might notice some irritation and congestion in your nose or some drainage.  This is from the oxygen used during your procedure.  There is no need for concern and it should clear up in a day or so.  SYMPTOMS TO REPORT IMMEDIATELY:   Following lower endoscopy (colonoscopy or flexible sigmoidoscopy):  Excessive amounts of blood in the stool  Significant tenderness or worsening of abdominal pains  Swelling of the abdomen that is new, acute  Fever of 100F or higher  For urgent or emergent issues, a gastroenterologist can be reached at any hour by calling [phone].   DIET:  We do recommend a small meal at first, but then you may proceed to your regular diet.  Drink plenty of fluids but you should avoid alcoholic beverages for 24 hours.  ACTIVITY:  You should plan to take it easy for the rest of today and you should NOT DRIVE or use heavy machinery until tomorrow (because of the sedation medicines used during the test).     FOLLOW UP: Our staff will call the number listed on your records the next business day following your procedure to check on you and address any questions or concerns that you may have regarding the information given to you following your procedure. If we do not reach you, we will leave a message.  However, if you are feeling well and you are not experiencing any problems, there is no need to return our call.  We will assume that you have returned to your regular daily activities without incident.  If any biopsies were taken you will be contacted by phone or by letter within the next 1-3 weeks.  Please call us at [phone] if you have not heard about the biopsies in 3 weeks.   Await Biopsy results to determined next follow up Colonoscopy Diverticulosis (handout given)   SIGNATURES/CONFIDENTIALITY: You and/or your care partner have signed paperwork which will be entered into your electronic medical record.  These signatures attest to the fact that that the information above on your After Visit Summary has been reviewed and is understood.  Full responsibility of the confidentiality of this discharge information lies with you and/or your care-partner.

## 2016-07-01 ENCOUNTER — Telehealth: Payer: Self-pay | Admitting: *Deleted

## 2016-07-01 NOTE — Telephone Encounter (Signed)
  Follow up Call-  Call back number 06/30/2016 05/03/2014  Post procedure Call Back phone  # [phone]  Permission to leave phone message Yes Yes  Some recent data might be hidden     Patient questions:  Do you have a fever, pain , or abdominal swelling? No. Pain Score  0 *  Have you tolerated food without any problems? Yes.    Have you been able to return to your normal activities? Yes.    Do you have any questions about your discharge instructions: Diet   No. Medications  No. Follow up visit  No.  Do you have questions or concerns about your Care? No.  Actions: * If pain score is 4 or above: No action needed, pain <4.

## 2016-07-04 ENCOUNTER — Telehealth: Payer: Self-pay

## 2016-07-04 NOTE — Telephone Encounter (Signed)
Great thanks much 

## 2016-07-04 NOTE — Telephone Encounter (Signed)
-----   Message from Manus Gunning, MD sent at 07/04/2016  1:21 PM EST ----- Caryl Pina can you let this patient know that the biopsies from his colon were benign, no precancerous changes. In this light he would not be due for another colonoscopy for 10 years, at which point he would be 76 years old, and we normally do not perform colonoscopy screening at that age. Thus I don't think he needs another colonoscopy for screening purposes.  He did have hemorrhoids and I believe having symptoms from these. I want to offer him a banding procedure if he is interested for therapy of his hemorrhoids. If he is can you please scheduled for a banding procedure. Thanks. Can you let me know if he decides. Thank you  Early Chars, no recall

## 2016-07-04 NOTE — Telephone Encounter (Signed)
Pt informed of benign results. Instructed that he is not due for another colonoscopy for 10 years and at that point we do not perform a screening colonoscopy at that age. Instructed pt to call if he doe have a problem to arise. Pt understood. Offered hemorrhoid banding to pt but he feels at this time that they are not bothering him. He will contact us if things change.

## 2016-10-16 ENCOUNTER — Ambulatory Visit: Payer: Medicare Other | Admitting: Oncology

## 2016-10-16 ENCOUNTER — Other Ambulatory Visit: Payer: Medicare Other

## 2017-07-03 ENCOUNTER — Other Ambulatory Visit: Payer: Self-pay | Admitting: Internal Medicine

## 2017-07-03 DIAGNOSIS — R220 Localized swelling, mass and lump, head: Principal | ICD-10-CM

## 2017-07-03 DIAGNOSIS — M278 Other specified diseases of jaws: Secondary | ICD-10-CM

## 2017-07-09 ENCOUNTER — Other Ambulatory Visit: Payer: Self-pay | Admitting: Unknown Physician Specialty

## 2017-07-09 DIAGNOSIS — K118 Other diseases of salivary glands: Secondary | ICD-10-CM

## 2017-07-14 ENCOUNTER — Other Ambulatory Visit: Payer: Self-pay | Admitting: Student

## 2017-07-15 ENCOUNTER — Other Ambulatory Visit: Payer: Self-pay | Admitting: Radiology

## 2017-07-16 ENCOUNTER — Ambulatory Visit: Payer: Medicare Other

## 2017-07-16 ENCOUNTER — Ambulatory Visit
Admission: RE | Admit: 2017-07-16 | Discharge: 2017-07-16 | Disposition: A | Discharge status: Home / Self Care | Payer: Medicare Other | Source: Ambulatory Visit | Attending: Unknown Physician Specialty | Admitting: Unknown Physician Specialty

## 2017-07-16 DIAGNOSIS — D37030 Neoplasm of uncertain behavior of the parotid salivary glands: Secondary | ICD-10-CM | POA: Insufficient documentation

## 2017-07-16 DIAGNOSIS — K118 Other diseases of salivary glands: Secondary | ICD-10-CM

## 2017-07-16 NOTE — Discharge Instructions (Signed)
Needle Biopsy, Care After Refer to this sheet in the next few weeks. These instructions provide you with information about caring for yourself after your procedure. Your health care provider may also give you more specific instructions. Your treatment has been planned according to current medical practices, but problems sometimes occur. Call your health care provider if you have any problems or questions after your procedure. What can I expect after the procedure? After your procedure, it is common to have soreness, bruising, or mild pain at the biopsy site. This should go away in a few days. Please take acetaminophen if tolerated, and use ICE PACK to decrease swelling and pain.  Follow these instructions at home:  Rest as directed by your health care provider.  Take medicines only as directed by your health care provider.  There are many different ways to close and cover the biopsy site, including stitches (sutures), skin glue, and adhesive strips. Follow your health care provider's instructions about: ? Biopsy site care. ? Bandage (dressing) changes and removal. ?   Check your biopsy site every day for signs of infection. Watch for: ? Redness, swelling, or pain. ? Fluid, blood, or pus. Contact a health care provider if:  You have a fever.  You have redness, swelling, or pain at the biopsy site that lasts longer than a few days.  You have fluid, blood, or pus coming from the biopsy site.  You feel nauseous.  You vomit. Get help right away if:  You have shortness of breath.  You have trouble breathing.  You have chest pain.  You feel dizzy or you faint.  You have bleeding that does not stop with pressure or a bandage.  You cough up blood.  You have pain in your abdomen. This information is not intended to replace advice given to you by your health care provider. Make sure you discuss any questions you have with your health care provider. Document Released: 10/24/2014  Document Revised: 11/15/2015 Document Reviewed: 06/05/2014 Elsevier Interactive Patient Education  [employer].

## 2017-07-16 NOTE — Procedures (Signed)
Pre Procedure Dx: Left parotid nodule Post Procedural Dx: Same  Technically successful US guided biopsy of indeterminate nodule within the left parotid gland.  EBL: None  No immediate complications.   Ronny Bacon, MD Pager #: [phone]

## 2017-07-17 LAB — SURGICAL PATHOLOGY

## 2017-07-20 ENCOUNTER — Other Ambulatory Visit: Payer: Self-pay | Admitting: Unknown Physician Specialty

## 2017-07-20 DIAGNOSIS — D37030 Neoplasm of uncertain behavior of the parotid salivary glands: Secondary | ICD-10-CM

## 2017-07-23 ENCOUNTER — Ambulatory Visit
Admission: RE | Admit: 2017-07-23 | Discharge: 2017-07-23 | Disposition: A | Discharge status: Home / Self Care | Payer: Medicare Other | Source: Ambulatory Visit | Attending: Unknown Physician Specialty | Admitting: Unknown Physician Specialty

## 2017-07-23 DIAGNOSIS — D37030 Neoplasm of uncertain behavior of the parotid salivary glands: Secondary | ICD-10-CM

## 2017-07-23 MED ORDER — IOPAMIDOL (ISOVUE-300) INJECTION 61%
75.0000 mL | Freq: Once | INTRAVENOUS | Status: AC | PRN
  Administered 2017-07-23: 75 mL via INTRAVENOUS

## 2017-07-27 ENCOUNTER — Other Ambulatory Visit: Payer: Self-pay | Admitting: Unknown Physician Specialty

## 2017-07-27 ENCOUNTER — Other Ambulatory Visit: Payer: Self-pay | Admitting: Internal Medicine

## 2017-07-27 DIAGNOSIS — M278 Other specified diseases of jaws: Secondary | ICD-10-CM

## 2017-07-27 DIAGNOSIS — D37030 Neoplasm of uncertain behavior of the parotid salivary glands: Secondary | ICD-10-CM

## 2017-07-27 DIAGNOSIS — R220 Localized swelling, mass and lump, head: Principal | ICD-10-CM

## 2017-07-28 ENCOUNTER — Ambulatory Visit: Admission: RE | Admit: 2017-07-28 | Payer: Medicare Other | Source: Ambulatory Visit

## 2017-07-30 ENCOUNTER — Encounter
Admission: RE | Admit: 2017-07-30 | Discharge: 2017-07-30 | Disposition: A | Discharge status: Home / Self Care | Payer: Medicare Other | Source: Ambulatory Visit | Attending: Unknown Physician Specialty | Admitting: Unknown Physician Specialty

## 2017-07-30 ENCOUNTER — Other Ambulatory Visit: Payer: Self-pay

## 2017-07-30 DIAGNOSIS — R9431 Abnormal electrocardiogram [ECG] [EKG]: Secondary | ICD-10-CM | POA: Diagnosis not present

## 2017-07-30 DIAGNOSIS — Z951 Presence of aortocoronary bypass graft: Secondary | ICD-10-CM | POA: Insufficient documentation

## 2017-07-30 DIAGNOSIS — Z01812 Encounter for preprocedural laboratory examination: Secondary | ICD-10-CM | POA: Insufficient documentation

## 2017-07-30 DIAGNOSIS — Z01818 Encounter for other preprocedural examination: Secondary | ICD-10-CM | POA: Diagnosis not present

## 2017-07-30 DIAGNOSIS — A09 Infectious gastroenteritis and colitis, unspecified: Secondary | ICD-10-CM | POA: Diagnosis not present

## 2017-07-30 HISTORY — DX: Malignant neoplasm of head, face and neck: C76.0

## 2017-07-30 NOTE — Patient Instructions (Signed)
Your procedure is scheduled on: August 04, 2017 Tuesday  Report to Same Day Surgery on the 2nd floor in the [facility]. To find out your arrival time, please call [phone] between 1PM - 3PM on: Monday August 03, 2017  REMEMBER: Instructions that are not followed completely may result in serious medical risk, up to and including death; or upon the discretion of your surgeon and anesthesiologist your surgery may need to be rescheduled.  Do not eat food after midnight the night before your procedure.  No gum chewing or hard candies.  You may however, drink CLEAR liquids up to 2 hours before you are scheduled to arrive at the hospital for your procedure.  Do not drink clear liquids within 2 hours of the start of your surgery.  Clear liquids include: - water  - apple juice without pulp - clear gatorade - black coffee or tea (Do NOT add anything to the coffee or tea) Do NOT drink anything that is not on this list.    No Alcohol for 24 hours before or after surgery.  No Smoking including e-cigarettes for 24 hours prior to surgery. No chewable tobacco products for at least 6 hours prior to surgery. No nicotine patches on the day of surgery.  On the morning of surgery brush your teeth with toothpaste and water, you may rinse your mouth with mouthwash if you wish. Do not swallow any  toothpaste of mouthwash.  Notify your doctor if there is any change in your medical condition (cold, fever, infection).  Do not wear jewelry, make-up, hairpins, clips or nail polish.  Do not wear lotions, powders, or perfumes. You may wear deodorant.  Do not shave 48 hours prior to surgery. Men may shave face and neck.  Contacts and dentures may not be worn into surgery.  Do not bring valuables to the hospital. [facility] is not responsible for any belongings or valuables.   TAKE THESE MEDICATIONS THE MORNING OF SURGERY WITH A SIP OF WATER: TRAMADOL PANTOPRAZOLE TAKE DOSE THE NIGHT BEFORE  SURGERY AND THE MORNING OF SURGERY     Follow recommendations from Cardiologist, Pulmonologist or PCP regarding stopping Aspirin, Coumadin, Plavix, Eliquis, Pradaxa, or Pletal.5 DAYS BEFORE SURGERY   Stop Anti-inflammatories such as Advil, Aleve, Ibuprofen, Motrin, Naproxen, Naprosyn, Goodie powder, MELOXICAM  or aspirin products. (May take Tylenol or Acetaminophen if needed.)    Stop ANY OVER THE COUNTER supplements until after surgery. (May continue Vitamin D, Vitamin B, and multivitamin.)  If you are being admitted to the hospital overnight, leave your suitcase in the car. After surgery it may be brought to your room.  If you are being discharged the day of surgery, you will not be allowed to drive home. You will need someone to drive you home and stay with you that night.   If you are taking public transportation, you will need to have a responsible adult to with you.  Please call the number above if you have any questions about these instructions.

## 2017-07-31 ENCOUNTER — Other Ambulatory Visit
Admission: RE | Admit: 2017-07-31 | Discharge: 2017-07-31 | Disposition: A | Discharge status: Home / Self Care | Payer: Medicare Other | Source: Ambulatory Visit | Attending: Internal Medicine | Admitting: Internal Medicine

## 2017-07-31 ENCOUNTER — Other Ambulatory Visit: Payer: Self-pay

## 2017-07-31 LAB — GASTROINTESTINAL PANEL BY PCR, STOOL (REPLACES STOOL CULTURE)

## 2017-08-04 ENCOUNTER — Encounter
Admission: RE | Disposition: A | Discharge status: Home / Self Care | Payer: Self-pay | Source: Ambulatory Visit | Attending: Unknown Physician Specialty

## 2017-08-04 ENCOUNTER — Ambulatory Visit: Payer: Medicare Other | Admitting: Anesthesiology

## 2017-08-04 ENCOUNTER — Encounter: Payer: Self-pay | Admitting: Anesthesiology

## 2017-08-04 ENCOUNTER — Ambulatory Visit
Admission: RE | Admit: 2017-08-04 | Discharge: 2017-08-04 | Disposition: A | Discharge status: Home / Self Care | Payer: Medicare Other | Source: Ambulatory Visit | Attending: Unknown Physician Specialty | Admitting: Unknown Physician Specialty

## 2017-08-04 DIAGNOSIS — E785 Hyperlipidemia, unspecified: Secondary | ICD-10-CM | POA: Insufficient documentation

## 2017-08-04 DIAGNOSIS — I2510 Atherosclerotic heart disease of native coronary artery without angina pectoris: Secondary | ICD-10-CM | POA: Insufficient documentation

## 2017-08-04 DIAGNOSIS — Z951 Presence of aortocoronary bypass graft: Secondary | ICD-10-CM | POA: Diagnosis not present

## 2017-08-04 DIAGNOSIS — Z791 Long term (current) use of non-steroidal anti-inflammatories (NSAID): Secondary | ICD-10-CM | POA: Diagnosis not present

## 2017-08-04 DIAGNOSIS — I10 Essential (primary) hypertension: Secondary | ICD-10-CM | POA: Diagnosis not present

## 2017-08-04 DIAGNOSIS — K118 Other diseases of salivary glands: Secondary | ICD-10-CM | POA: Insufficient documentation

## 2017-08-04 DIAGNOSIS — E7800 Pure hypercholesterolemia, unspecified: Secondary | ICD-10-CM | POA: Insufficient documentation

## 2017-08-04 DIAGNOSIS — Z7982 Long term (current) use of aspirin: Secondary | ICD-10-CM | POA: Insufficient documentation

## 2017-08-04 DIAGNOSIS — Z8589 Personal history of malignant neoplasm of other organs and systems: Secondary | ICD-10-CM | POA: Diagnosis not present

## 2017-08-04 DIAGNOSIS — Z8673 Personal history of transient ischemic attack (TIA), and cerebral infarction without residual deficits: Secondary | ICD-10-CM | POA: Insufficient documentation

## 2017-08-04 DIAGNOSIS — K219 Gastro-esophageal reflux disease without esophagitis: Secondary | ICD-10-CM | POA: Insufficient documentation

## 2017-08-04 DIAGNOSIS — Z96652 Presence of left artificial knee joint: Secondary | ICD-10-CM | POA: Diagnosis not present

## 2017-08-04 DIAGNOSIS — Z7951 Long term (current) use of inhaled steroids: Secondary | ICD-10-CM | POA: Diagnosis not present

## 2017-08-04 DIAGNOSIS — Z8546 Personal history of malignant neoplasm of prostate: Secondary | ICD-10-CM | POA: Diagnosis not present

## 2017-08-04 DIAGNOSIS — Z79899 Other long term (current) drug therapy: Secondary | ICD-10-CM | POA: Diagnosis not present

## 2017-08-04 DIAGNOSIS — G4730 Sleep apnea, unspecified: Secondary | ICD-10-CM | POA: Insufficient documentation

## 2017-08-04 HISTORY — PX: PAROTIDECTOMY: SHX2163

## 2017-08-04 LAB — CBC
HCT: 40.8 % (ref 40.0–52.0)
HEMOGLOBIN: 13.6 g/dL (ref 13.0–18.0)
MCH: 30.3 pg (ref 26.0–34.0)
MCHC: 33.2 g/dL (ref 32.0–36.0)
MCV: 91.3 fL (ref 80.0–100.0)
Platelets: 247 10*3/uL (ref 150–440)
RBC: 4.47 MIL/uL (ref 4.40–5.90)
RDW: 13.7 % (ref 11.5–14.5)
WBC: 7.8 10*3/uL (ref 3.8–10.6)

## 2017-08-04 LAB — PROTIME-INR
INR: 0.94
Prothrombin Time: 12.5 seconds (ref 11.4–15.2)

## 2017-08-04 LAB — APTT: aPTT: 30 seconds (ref 24–36)

## 2017-08-04 SURGERY — EXCISION, PAROTID GLAND
Anesthesia: General | Laterality: Left

## 2017-08-04 MED ORDER — SUCCINYLCHOLINE CHLORIDE 20 MG/ML IJ SOLN
INTRAMUSCULAR | Status: AC
  Filled 2017-08-04: qty 1

## 2017-08-04 MED ORDER — HYDROCODONE-ACETAMINOPHEN 5-325 MG PO TABS: 1.0000 | ORAL_TABLET | Freq: Four times a day (QID) | ORAL | 0 refills | Status: DC | PRN

## 2017-08-04 MED ORDER — PROPOFOL 10 MG/ML IV BOLUS
INTRAVENOUS | Status: DC | PRN
  Administered 2017-08-04: 150 mg via INTRAVENOUS

## 2017-08-04 MED ORDER — SUCCINYLCHOLINE CHLORIDE 20 MG/ML IJ SOLN
INTRAMUSCULAR | Status: DC | PRN
  Administered 2017-08-04: 100 mg via INTRAVENOUS

## 2017-08-04 MED ORDER — FENTANYL CITRATE (PF) 100 MCG/2ML IJ SOLN
25.0000 ug | INTRAMUSCULAR | Status: DC | PRN
  Administered 2017-08-04 (×3): 25 ug via INTRAVENOUS

## 2017-08-04 MED ORDER — ONDANSETRON HCL 4 MG/2ML IJ SOLN: 4.0000 mg | Freq: Once | INTRAMUSCULAR | Status: DC | PRN

## 2017-08-04 MED ORDER — DEXAMETHASONE SODIUM PHOSPHATE 10 MG/ML IJ SOLN
INTRAMUSCULAR | Status: DC | PRN
  Administered 2017-08-04: 10 mg via INTRAVENOUS

## 2017-08-04 MED ORDER — DEXAMETHASONE SODIUM PHOSPHATE 10 MG/ML IJ SOLN
INTRAMUSCULAR | Status: AC
  Filled 2017-08-04: qty 1

## 2017-08-04 MED ORDER — EPHEDRINE SULFATE 50 MG/ML IJ SOLN
INTRAMUSCULAR | Status: AC
  Filled 2017-08-04: qty 1

## 2017-08-04 MED ORDER — MIDAZOLAM HCL 2 MG/2ML IJ SOLN
INTRAMUSCULAR | Status: DC | PRN
  Administered 2017-08-04: 2 mg via INTRAVENOUS

## 2017-08-04 MED ORDER — REMIFENTANIL HCL 1 MG IV SOLR
INTRAVENOUS | Status: DC | PRN
  Administered 2017-08-04: .08 ug/kg/min via INTRAVENOUS

## 2017-08-04 MED ORDER — LIDOCAINE-EPINEPHRINE 1 %-1:100000 IJ SOLN
INTRAMUSCULAR | Status: AC
Start: 2017-08-04 — End: ?
  Filled 2017-08-04: qty 1

## 2017-08-04 MED ORDER — LACTATED RINGERS IV SOLN
INTRAVENOUS | Status: DC
  Administered 2017-08-04: 07:00:00 via INTRAVENOUS

## 2017-08-04 MED ORDER — REMIFENTANIL HCL 1 MG IV SOLR
INTRAVENOUS | Status: AC
Start: 2017-08-04 — End: ?
  Filled 2017-08-04: qty 1000

## 2017-08-04 MED ORDER — LIDOCAINE-EPINEPHRINE 1 %-1:100000 IJ SOLN
INTRAMUSCULAR | Status: DC | PRN
  Administered 2017-08-04: 5 mL

## 2017-08-04 MED ORDER — MIDAZOLAM HCL 2 MG/2ML IJ SOLN
INTRAMUSCULAR | Status: AC
  Filled 2017-08-04: qty 2

## 2017-08-04 MED ORDER — SODIUM CHLORIDE 0.9 % IV SOLN: INTRAVENOUS | Status: DC

## 2017-08-04 MED ORDER — ONDANSETRON HCL 4 MG/2ML IJ SOLN
INTRAMUSCULAR | Status: AC
  Filled 2017-08-04: qty 2

## 2017-08-04 MED ORDER — PROPOFOL 10 MG/ML IV BOLUS
INTRAVENOUS | Status: AC
  Filled 2017-08-04: qty 20

## 2017-08-04 MED ORDER — HYDROCODONE-ACETAMINOPHEN 5-325 MG PO TABS
ORAL_TABLET | ORAL | Status: AC
  Filled 2017-08-04: qty 1

## 2017-08-04 MED ORDER — REMIFENTANIL HCL 1 MG IV SOLR
INTRAVENOUS | Status: DC | PRN
  Administered 2017-08-04 (×4): 50 ug via INTRAVENOUS

## 2017-08-04 MED ORDER — HYDROCODONE-ACETAMINOPHEN 5-325 MG PO TABS
1.0000 | ORAL_TABLET | Freq: Four times a day (QID) | ORAL | Status: DC | PRN
  Administered 2017-08-04: 1 via ORAL

## 2017-08-04 MED ORDER — FENTANYL CITRATE (PF) 100 MCG/2ML IJ SOLN
INTRAMUSCULAR | Status: AC
  Filled 2017-08-04: qty 2

## 2017-08-04 MED ORDER — EPHEDRINE SULFATE 50 MG/ML IJ SOLN
INTRAMUSCULAR | Status: DC | PRN
  Administered 2017-08-04 (×3): 10 mg via INTRAVENOUS

## 2017-08-04 MED ORDER — REMIFENTANIL HCL 1 MG IV SOLR
INTRAVENOUS | Status: AC
  Filled 2017-08-04: qty 1000

## 2017-08-04 SURGICAL SUPPLY — 42 items
ADHESIVE MASTISOL STRL (MISCELLANEOUS) ×2 IMPLANT
BLADE SURG 15 STRL LF DISP TIS (BLADE) ×1 IMPLANT
BLADE SURG 15 STRL SS (BLADE) ×1
CANISTER SUCT 1200ML W/VALVE (MISCELLANEOUS) ×2 IMPLANT
CORD BIP STRL DISP 12FT (MISCELLANEOUS) ×2 IMPLANT
DERMABOND ADVANCED (GAUZE/BANDAGES/DRESSINGS) ×1
DERMABOND ADVANCED .7 DNX12 (GAUZE/BANDAGES/DRESSINGS) ×1 IMPLANT
DRAIN TLS ROUND 10FR (DRAIN) ×2 IMPLANT
DRAPE MAG INST 16X20 L/F (DRAPES) ×2 IMPLANT
DRAPE SURG 17X11 SM STRL (DRAPES) ×2 IMPLANT
DRSG TEGADERM 2-3/8X2-3/4 SM (GAUZE/BANDAGES/DRESSINGS) ×6 IMPLANT
ELECT CAUTERY BLADE TIP 2.5 (TIP) ×2
ELECT EMG 20MM DUAL (MISCELLANEOUS) ×4
ELECT NEEDLE 20X.3 GREEN (MISCELLANEOUS) ×2
ELECT REM PT RETURN 9FT ADLT (ELECTROSURGICAL) ×2
ELECTRODE CAUTERY BLDE TIP 2.5 (TIP) ×1 IMPLANT
ELECTRODE EMG 20MM DUAL (MISCELLANEOUS) ×2 IMPLANT
ELECTRODE NEEDLE 20X.3 GREEN (MISCELLANEOUS) ×1 IMPLANT
ELECTRODE REM PT RTRN 9FT ADLT (ELECTROSURGICAL) ×1 IMPLANT
FORCEPS JEWEL BIP 4-3/4 STR (INSTRUMENTS) ×2 IMPLANT
GLOVE BIO SURGEON STRL SZ7.5 (GLOVE) ×4 IMPLANT
GOWN STRL REUS W/ TWL LRG LVL3 (GOWN DISPOSABLE) ×5 IMPLANT
GOWN STRL REUS W/TWL LRG LVL3 (GOWN DISPOSABLE) ×5
HEMOSTAT SURGICEL 2X3 (HEMOSTASIS) ×2 IMPLANT
HOOK STAY 5M SHARP BLUNT 3316- (MISCELLANEOUS) ×2 IMPLANT
KIT TURNOVER KIT A (KITS) ×2 IMPLANT
LABEL OR SOLS (LABEL) IMPLANT
MARKER SKIN DUAL TIP RULER LAB (MISCELLANEOUS) ×2 IMPLANT
NS IRRIG 500ML POUR BTL (IV SOLUTION) ×2 IMPLANT
PACK HEAD/NECK (MISCELLANEOUS) ×2 IMPLANT
PROBE MONO 100X0.75 ELECT 1.9M (MISCELLANEOUS) ×2 IMPLANT
SHEARS HARMONIC 9CM CVD (BLADE) ×2 IMPLANT
SPONGE KITTNER 5P (MISCELLANEOUS) ×4 IMPLANT
SPONGE XRAY 4X4 16PLY STRL (MISCELLANEOUS) ×4 IMPLANT
STAPLER SKIN PROX 35W (STAPLE) ×2 IMPLANT
SUT PROLENE 5 0 PS 3 (SUTURE) ×2 IMPLANT
SUT SILK 2 0 (SUTURE) ×1
SUT SILK 2-0 30XBRD TIE 12 (SUTURE) ×1 IMPLANT
SUT SILK 3 0 (SUTURE) ×1
SUT SILK 3-0 18XBRD TIE 12 (SUTURE) ×1 IMPLANT
SUT VIC AB 4-0 RB1 18 (SUTURE) ×4 IMPLANT
SYSTEM CHEST DRAIN TLS 7FR (DRAIN) IMPLANT

## 2017-08-04 NOTE — Anesthesia Procedure Notes (Signed)
Procedure Name: Intubation Date/Time: 08/04/2017 7:25 AM Performed by: Jonna Clark, CRNA Pre-anesthesia Checklist: Patient identified, Patient being monitored, Timeout performed, Emergency Drugs available and Suction available Patient Re-evaluated:Patient Re-evaluated prior to induction Oxygen Delivery Method: Circle system utilized Preoxygenation: Pre-oxygenation with 100% oxygen Induction Type: IV induction and Cricoid Pressure applied Ventilation: Mask ventilation without difficulty Laryngoscope Size: Mac and 3 Grade View: Grade II Tube type: Oral Tube size: 7.5 mm Number of attempts: 1 Placement Confirmation: ETT inserted through vocal cords under direct vision,  positive ETCO2 and breath sounds checked- equal and bilateral Secured at: 21 cm Tube secured with: Tape Dental Injury: Teeth and Oropharynx as per pre-operative assessment

## 2017-08-04 NOTE — Transfer of Care (Signed)
Immediate Anesthesia Transfer of Care Note  Patient: Wesley [name]  Procedure(s) Performed: SUPERFICIAL PAROTIDECTOMY (Left )  Patient Location: PACU  Anesthesia Type:General  Level of Consciousness: sedated and responds to stimulation  Airway & Oxygen Therapy: Patient Spontanous Breathing and Patient connected to face mask oxygen  Post-op Assessment: Report given to RN and Post -op Vital signs reviewed and stable  Post vital signs: Reviewed and stable  Last Vitals:  Vitals:   08/04/17 0659 08/04/17 0928  BP: (!) 150/83 (!) 151/65  Pulse: 70 90  Resp: 18 14  Temp: 36.4 C 36.6 C  SpO2: 96% 98%    Last Pain:  Vitals:   08/04/17 0659  TempSrc: Tympanic  PainSc: 3          Complications: No apparent anesthesia complications

## 2017-08-04 NOTE — Anesthesia Post-op Follow-up Note (Signed)
Anesthesia QCDR form completed.        

## 2017-08-04 NOTE — Op Note (Signed)
08/04/2017  9:19 AM    Wesley [name]  [id number]   Pre-Op Dx: parotid mass  Post-op Dx: SAME  Proc: Left superficial parotidectomy with expiration and preservation of facial nerve: Facial nerve monitoring 2 hours.   Surg:  Roena Malady   Asst: Juengel  Anes:  GOT  EBL:  Less than 20 cc  Comp:  None  Findings:  Approximately 3 cm firm mass in the inferior aspect of the parotid adjacent to the marginal and buccal branches of the facial nerve.  Procedure: Josph Macho was identified in the holding area taken the operating room placed in supine position. After general endotracheal anesthesia the table was turned 90. A facial nerve monitor was applied to the left face and remained on throughout the procedure. An incision line was marked in the pretragal region down onto the upper neck. A local anesthetic of 1% lidocaine with 1 100,000 units of epinephrine was used to inject along the incision line a total of 5 cc was used. The left face and upper neck were then prepped and draped in the usual fashion. A 15 blade was used to incise down to the platysma inferiorly and the superficial muscular aponeurotic system or SMAS superiorly. Hemostasis was achieved using the ball Bovie cautery. A supra-SMAS flap was then elevated out onto the face. Retractors were then placed. The fascia was then divided in front of the tragus and a plane was created in the pretragal region and connected inferiorly with the sternocleidomastoid muscle fascia. This was dissected using hemostat and Harmonic scalpel. The parotid was retracted anteriorly on the operation then turned to the identification the facial nerve. The tragal cartilage was traced medially and the facial nerve trunk was identified as it left the skull base. This was stimulated and indeed all branches stimulated. The trunk was then dissected outward the first branch to be encountered was inferior branch was stimulated to the platysma and the lower lip.  This nerve was dissected inferiorly removing the parotid gland from it laterally. The nerve trunk was retraced and readdressed and the buccal branch was identified this was traced anteriorly removing the parotid gland from superficial aspect of this nerve. The superior branch to the temporal region was identified but was not traced as the tumor was inferior between the buccal and the marginal branches. The marginal branch was then traced laterally and was adherent to the inferior aspect of the tumor. Careful dissection using a hemostat and the microbipolar's were used to dissect it gently from the tumor mass. Care was taken not to invade the tumor capsule. The nerve was freed up and retracted inferiorly and dissection proceeded around the tumor mass inferiorly and partially into the deep lobe. The dissection then proceeded anteriorly and superiorly dissecting the tumor from the surrounding normal deep lobe parotid tissue. Harmonic scalpel was used for this dissection. The tumor mass was then rotated anteriorly and the fascia on the anterior border of the tumor was divided using the Harmonic scalpel. This allowed the tumor to be removed in its entirety. With the tumor removed the wound was copiously irrigated with saline any small bleeding points were cauterized using the microbipolar. The nerve stimulator was then used to stimulate all branches of the nerve including the main trunk and all aspects of the face were functional. With no active bleeding a #10 TLS drain was brought of the wound inferiorly. The SMAS layer was then closed using interrupted 4-0 Vicryl the platysma layer was also closed using 4-0 Vicryl.  Subcutaneous tissues of the face flap were also closed using 4-0 Vicryl and the skin was closed using Dermabond. A Tegaderm was then used to affix the drain which was armed. The drapes then removed facial nerve monitor was removed and the patient was return anesthesia where he was awakened in the operating  room taken recovery room in stable condition.  Cultures: None  Specimens: Left superficial lobe parotid  Dispo:   Good  Plan:  Discharge to home with drain in place with instructions on how to empty and reset the drain follow-up in my office in 2 days  Roena Malady  08/04/2017 9:19 AM

## 2017-08-04 NOTE — Anesthesia Preprocedure Evaluation (Signed)
Anesthesia Evaluation  Patient identified by MRN, date of birth, ID band Patient awake    Reviewed: Allergy & Precautions, NPO status , Patient's Chart, lab work & pertinent test results  Airway Mallampati: II  TM Distance: >3 FB     Dental  (+) Teeth Intact   Pulmonary sleep apnea ,    Pulmonary exam normal        Cardiovascular hypertension, Pt. on medications + CAD  Normal cardiovascular exam     Neuro/Psych CVA negative psych ROS   GI/Hepatic Neg liver ROS, GERD  Medicated,  Endo/Other  negative endocrine ROS  Renal/GU negative Renal ROS     Musculoskeletal  (+) Arthritis , Osteoarthritis,    Abdominal Normal abdominal exam  (+)   Peds negative pediatric ROS (+)  Hematology negative hematology ROS (+)   Anesthesia Other Findings   Reproductive/Obstetrics                             Anesthesia Physical Anesthesia Plan  ASA: III  Anesthesia Plan: General   Post-op Pain Management:    Induction: Intravenous  PONV Risk Score and Plan:   Airway Management Planned: Oral ETT  Additional Equipment:   Intra-op Plan:   Post-operative Plan: Extubation in OR  Informed Consent: I have reviewed the patients History and Physical, chart, labs and discussed the procedure including the risks, benefits and alternatives for the proposed anesthesia with the patient or authorized representative who has indicated his/her understanding and acceptance.   Dental advisory given  Plan Discussed with: CRNA and Surgeon  Anesthesia Plan Comments:         Anesthesia Quick Evaluation

## 2017-08-04 NOTE — H&P (Signed)
The patient's history has been reviewed, patient examined, no change in status, stable for surgery.  Questions were answered to the patients satisfaction.  

## 2017-08-04 NOTE — Anesthesia Postprocedure Evaluation (Signed)
Anesthesia Post Note  Patient: Wesley [name]  Procedure(s) Performed: SUPERFICIAL PAROTIDECTOMY (Left )  Patient location during evaluation: PACU Anesthesia Type: General Level of consciousness: awake and alert and oriented Pain management: pain level controlled Vital Signs Assessment: post-procedure vital signs reviewed and stable Respiratory status: spontaneous breathing Cardiovascular status: blood pressure returned to baseline Anesthetic complications: no     Last Vitals:  Vitals:   08/04/17 1028 08/04/17 1147  BP: (!) 145/70 (!) 125/57  Pulse: 73 73  Resp: 16 16  Temp: (!) 36.3 C   SpO2: 95% 95%    Last Pain:  Vitals:   08/04/17 1147  TempSrc:   PainSc: 4                  [name]

## 2017-08-04 NOTE — Discharge Instructions (Signed)

## 2017-08-05 ENCOUNTER — Encounter: Payer: Self-pay | Admitting: Unknown Physician Specialty

## 2017-08-19 LAB — SURGICAL PATHOLOGY

## 2017-08-20 ENCOUNTER — Other Ambulatory Visit: Payer: Self-pay | Admitting: Pathology

## 2017-08-27 ENCOUNTER — Other Ambulatory Visit: Payer: Self-pay | Admitting: Pathology

## 2018-05-03 ENCOUNTER — Inpatient Hospital Stay: Admission: RE | Admit: 2018-05-03 | Payer: Medicare Other | Source: Ambulatory Visit

## 2018-05-04 NOTE — Discharge Instructions (Signed)
°  Instructions after Total Knee Replacement ° ° Lucious Zou P. Tai Syfert, Jr., M.D.    ° Dept. of Orthopaedics & Sports Medicine ° [facility] ° [address] ° [place], [place]  [address] ° Phone: [phone]   Fax: [phone] ° °  °DIET: °• Drink plenty of non-alcoholic fluids. °• Resume your normal diet. Include foods high in fiber. ° °ACTIVITY:  °• You may use crutches or a walker with weight-bearing as tolerated, unless instructed otherwise. °• You may be weaned off of the walker or crutches by your Physical Therapist.  °• Do NOT place pillows under the knee. Anything placed under the knee could limit your ability to straighten the knee.   °• Continue doing gentle exercises. Exercising will reduce the pain and swelling, increase motion, and prevent muscle weakness.   °• Please continue to use the TED compression stockings for 6 weeks. You may remove the stockings at night, but should reapply them in the morning. °• Do not drive or operate any equipment until instructed. ° °WOUND CARE:  °• Continue to use the PolarCare or ice packs periodically to reduce pain and swelling. °• You may bathe or shower after the staples are removed at the first office visit following surgery. ° °MEDICATIONS: °• You may resume your regular medications. °• Please take the pain medication as prescribed on the medication. °• Do not take pain medication on an empty stomach. °• You have been given a prescription for a blood thinner (Lovenox or Coumadin). Please take the medication as instructed. (NOTE: After completing a 2 week course of Lovenox, take one Enteric-coated aspirin once a day. This along with elevation will help reduce the possibility of phlebitis in your operated leg.) °• Do not drive or drink alcoholic beverages when taking pain medications. ° °CALL THE OFFICE FOR: °• Temperature above 101 degrees °• Excessive bleeding or drainage on the dressing. °• Excessive swelling, coldness, or paleness of the toes. °• Persistent  nausea and vomiting. ° °FOLLOW-UP:  °• You should have an appointment to return to the office in 10-14 days after surgery. °• Arrangements have been made for continuation of Physical Therapy (either home therapy or outpatient therapy). °  °

## 2018-05-05 ENCOUNTER — Encounter
Admission: RE | Admit: 2018-05-05 | Discharge: 2018-05-05 | Disposition: A | Discharge status: Home / Self Care | Payer: Medicare Other | Source: Ambulatory Visit | Attending: Orthopedic Surgery | Admitting: Orthopedic Surgery

## 2018-05-05 ENCOUNTER — Other Ambulatory Visit: Payer: Self-pay

## 2018-05-05 DIAGNOSIS — I10 Essential (primary) hypertension: Secondary | ICD-10-CM | POA: Diagnosis not present

## 2018-05-05 DIAGNOSIS — I498 Other specified cardiac arrhythmias: Secondary | ICD-10-CM | POA: Insufficient documentation

## 2018-05-05 DIAGNOSIS — R9431 Abnormal electrocardiogram [ECG] [EKG]: Secondary | ICD-10-CM | POA: Insufficient documentation

## 2018-05-05 DIAGNOSIS — Z01818 Encounter for other preprocedural examination: Secondary | ICD-10-CM | POA: Insufficient documentation

## 2018-05-05 LAB — COMPREHENSIVE METABOLIC PANEL
ALT: 25 U/L (ref 0–44)
AST: 26 U/L (ref 15–41)
Albumin: 3.9 g/dL (ref 3.5–5.0)
Alkaline Phosphatase: 73 U/L (ref 38–126)
Anion gap: 9 (ref 5–15)
BUN: 19 mg/dL (ref 8–23)
CHLORIDE: 104 mmol/L (ref 98–111)
CO2: 28 mmol/L (ref 22–32)
Calcium: 10 mg/dL (ref 8.9–10.3)
Creatinine, Ser: 0.8 mg/dL (ref 0.61–1.24)
Glucose, Bld: 100 mg/dL — ABNORMAL HIGH (ref 70–99)
POTASSIUM: 4.8 mmol/L (ref 3.5–5.1)
SODIUM: 141 mmol/L (ref 135–145)
Total Bilirubin: 0.7 mg/dL (ref 0.3–1.2)
Total Protein: 6.8 g/dL (ref 6.5–8.1)

## 2018-05-05 LAB — PROTIME-INR
INR: 0.95
PROTHROMBIN TIME: 12.6 s (ref 11.4–15.2)

## 2018-05-05 LAB — URINALYSIS, ROUTINE W REFLEX MICROSCOPIC
BILIRUBIN URINE: NEGATIVE
GLUCOSE, UA: NEGATIVE mg/dL
Hgb urine dipstick: NEGATIVE
Ketones, ur: NEGATIVE mg/dL
Leukocytes, UA: NEGATIVE
Nitrite: NEGATIVE
PH: 6 (ref 5.0–8.0)
Protein, ur: NEGATIVE mg/dL
Specific Gravity, Urine: 1.018 (ref 1.005–1.030)

## 2018-05-05 LAB — APTT: APTT: 30 s (ref 24–36)

## 2018-05-05 LAB — CBC
HCT: 42.5 % (ref 39.0–52.0)
Hemoglobin: 13.7 g/dL (ref 13.0–17.0)
MCH: 29.7 pg (ref 26.0–34.0)
MCHC: 32.2 g/dL (ref 30.0–36.0)
MCV: 92.2 fL (ref 80.0–100.0)
NRBC: 0 % (ref 0.0–0.2)
PLATELETS: 244 10*3/uL (ref 150–400)
RBC: 4.61 MIL/uL (ref 4.22–5.81)
RDW: 13.6 % (ref 11.5–15.5)
WBC: 9.5 10*3/uL (ref 4.0–10.5)

## 2018-05-05 LAB — TYPE AND SCREEN
ABO/RH(D): O POS
ANTIBODY SCREEN: NEGATIVE

## 2018-05-05 LAB — SURGICAL PCR SCREEN
MRSA, PCR: NEGATIVE
STAPHYLOCOCCUS AUREUS: POSITIVE — AB

## 2018-05-05 LAB — C-REACTIVE PROTEIN: CRP: <0.8 mg/dL (ref <1.0)

## 2018-05-05 LAB — SEDIMENTATION RATE: Sed Rate: 3 mm/hr (ref 0–20)

## 2018-05-05 NOTE — Pre-Procedure Instructions (Signed)
Abnormal pcr screen - positive for staph - result faxed to Dr Clydell Hakim offilce

## 2018-05-06 LAB — URINE CULTURE
CULTURE: NO GROWTH
SPECIAL REQUESTS: NORMAL

## 2018-05-06 NOTE — Patient Instructions (Signed)
Your procedure is scheduled on:05/17/18 Report to [place] To find out your arrival time please call [phone] between 1PM - 3PM on  05/14/18  Remember: Instructions that are not followed completely may result in serious medical risk,  up to and including death, or upon the discretion of your surgeon and anesthesiologist your  surgery may need to be rescheduled.     _X__ 1. Do not eat food after midnight the night before your procedure.                 No gum chewing or hard candies. You may drink clear liquids up to 2 hours                 before you are scheduled to arrive for your surgery- DO not drink clear                 liquids within 2 hours of the start of your surgery.                 Clear Liquids include:  water, apple juice without pulp, clear carbohydrate                 drink such as Clearfast of Gatorade, Black Coffee or Tea (Do not add                 anything to coffee or tea).  __X__2.  On the morning of surgery brush your teeth with toothpaste and water, you                may rinse your mouth with mouthwash if you wish.  Do not swallow any toothpaste of mouthwash.     _X__ 3.  No Alcohol for 24 hours before or after surgery.   _X__ 4.  Do Not Smoke or use e-cigarettes For 24 Hours Prior to Your Surgery.                 Do not use any chewable tobacco products for at least 6 hours prior to                 surgery.  ____  5.  Bring all medications with you on the day of surgery if instructed.   __X__  6.  Notify your doctor if there is any change in your medical condition      (cold, fever, infections).     Do not wear jewelry, make-up, hairpins, clips or nail polish. Do not wear lotions, powders, or perfumes. You may wear deodorant. Do not shave 48 hours prior to surgery. Men may shave face and neck. Do not bring valuables to the hospital.    [facility] is not responsible for any belongings or valuables.  Contacts,  dentures or bridgework may not be worn into surgery. Leave your suitcase in the car. After surgery it may be brought to your room. For patients admitted to the hospital, discharge time is determined by your treatment team.   Patients discharged the day of surgery will not be allowed to drive home.   Please read over the following fact sheets that you were given:   Surgical Site Infection Prevention  _X___ Take these medicines the morning of surgery with A SIP OF WATER:    1. TRAMADOL  2. MAGNESIUM  3. PANTOPRAZOLE  4.  5.  6.  ____ Fleet Enema (as directed)   _X___ Use CHG Soap as directed  ____ Use inhalers  on the day of surgery  ____ Stop metformin 2 days prior to surgery    ____ Take 1/2 of usual insulin dose the night before surgery. No insulin the morning          of surgery.   _X___ Stop Coumadin/Plavix/aspirin on  STOP ASPIRIN 7 DAYS BEFORE SURGERY  __X__ Stop Anti-inflammatories on  STOP ALL NOW UNTIL AFTER SURGERY/ NO EXCEDRIN/ NO MELOXICAM/ NO IBUPROFEN   _X__ Stop supplements until after surgery.  STOP NOW ZINC/ SELENIUM/ OMEGAS/ GLUCOSAMIN/ ECHINACEA/ GINKGO  ____ Bring C-Pap to the hospital.

## 2018-05-06 NOTE — Pre-Procedure Instructions (Signed)
PATIENT BROUGHT ALL MEDS AND VERIFIED.

## 2018-05-07 MED ORDER — FAMOTIDINE 20 MG PO TABS: 20.0000 mg | ORAL_TABLET | Freq: Once | ORAL | Status: DC

## 2018-05-16 MED ORDER — TRANEXAMIC ACID-NACL 1000-0.7 MG/100ML-% IV SOLN
1000.0000 mg | INTRAVENOUS | Status: AC
  Administered 2018-05-17: 1000 mg via INTRAVENOUS
  Filled 2018-05-16: qty 100

## 2018-05-16 MED ORDER — CLINDAMYCIN PHOSPHATE 900 MG/50ML IV SOLN
900.0000 mg | INTRAVENOUS | Status: AC
  Administered 2018-05-17: 900 mg via INTRAVENOUS

## 2018-05-17 ENCOUNTER — Other Ambulatory Visit: Payer: Self-pay

## 2018-05-17 ENCOUNTER — Inpatient Hospital Stay: Payer: Medicare Other

## 2018-05-17 ENCOUNTER — Inpatient Hospital Stay: Payer: Medicare Other | Admitting: Certified Registered Nurse Anesthetist

## 2018-05-17 ENCOUNTER — Inpatient Hospital Stay
Admission: RE | Admit: 2018-05-17 | Discharge: 2018-05-19 | DRG: 470 | Disposition: A | Discharge status: Home Health | Payer: Medicare Other | Source: Ambulatory Visit | Attending: Orthopedic Surgery | Admitting: Orthopedic Surgery

## 2018-05-17 ENCOUNTER — Encounter
Admission: RE | Disposition: A | Discharge status: Home Health | Payer: Self-pay | Source: Ambulatory Visit | Attending: Orthopedic Surgery

## 2018-05-17 ENCOUNTER — Encounter: Payer: Self-pay | Admitting: Orthopedic Surgery

## 2018-05-17 DIAGNOSIS — E785 Hyperlipidemia, unspecified: Secondary | ICD-10-CM | POA: Diagnosis present

## 2018-05-17 DIAGNOSIS — M549 Dorsalgia, unspecified: Secondary | ICD-10-CM | POA: Diagnosis present

## 2018-05-17 DIAGNOSIS — M659 Unspecified synovitis and tenosynovitis, unspecified site: Secondary | ICD-10-CM | POA: Insufficient documentation

## 2018-05-17 DIAGNOSIS — I2510 Atherosclerotic heart disease of native coronary artery without angina pectoris: Secondary | ICD-10-CM | POA: Insufficient documentation

## 2018-05-17 DIAGNOSIS — I10 Essential (primary) hypertension: Secondary | ICD-10-CM | POA: Diagnosis present

## 2018-05-17 DIAGNOSIS — G5600 Carpal tunnel syndrome, unspecified upper limb: Secondary | ICD-10-CM | POA: Insufficient documentation

## 2018-05-17 DIAGNOSIS — Z96653 Presence of artificial knee joint, bilateral: Secondary | ICD-10-CM

## 2018-05-17 DIAGNOSIS — M7139 Other bursal cyst, multiple sites: Secondary | ICD-10-CM

## 2018-05-17 DIAGNOSIS — G8929 Other chronic pain: Secondary | ICD-10-CM | POA: Diagnosis present

## 2018-05-17 DIAGNOSIS — Z8673 Personal history of transient ischemic attack (TIA), and cerebral infarction without residual deficits: Secondary | ICD-10-CM | POA: Diagnosis not present

## 2018-05-17 DIAGNOSIS — Z8546 Personal history of malignant neoplasm of prostate: Secondary | ICD-10-CM | POA: Diagnosis not present

## 2018-05-17 DIAGNOSIS — Z951 Presence of aortocoronary bypass graft: Secondary | ICD-10-CM | POA: Diagnosis not present

## 2018-05-17 DIAGNOSIS — M6749 Ganglion, multiple sites: Secondary | ICD-10-CM | POA: Insufficient documentation

## 2018-05-17 DIAGNOSIS — M6789 Other specified disorders of synovium and tendon, multiple sites: Secondary | ICD-10-CM

## 2018-05-17 DIAGNOSIS — E559 Vitamin D deficiency, unspecified: Secondary | ICD-10-CM | POA: Diagnosis present

## 2018-05-17 DIAGNOSIS — M1711 Unilateral primary osteoarthritis, right knee: Secondary | ICD-10-CM | POA: Diagnosis present

## 2018-05-17 DIAGNOSIS — Z96659 Presence of unspecified artificial knee joint: Secondary | ICD-10-CM

## 2018-05-17 HISTORY — PX: KNEE ARTHROPLASTY: SHX992

## 2018-05-17 HISTORY — PX: JOINT REPLACEMENT: SHX530

## 2018-05-17 LAB — ABO/RH: ABO/RH(D): O POS

## 2018-05-17 SURGERY — ARTHROPLASTY, KNEE, TOTAL, USING IMAGELESS COMPUTER-ASSISTED NAVIGATION
Anesthesia: Spinal | Laterality: Right

## 2018-05-17 MED ORDER — CHLORHEXIDINE GLUCONATE 4 % EX LIQD: 60.0000 mL | Freq: Once | CUTANEOUS | Status: DC

## 2018-05-17 MED ORDER — PROPOFOL 500 MG/50ML IV EMUL
INTRAVENOUS | Status: AC
  Filled 2018-05-17: qty 50

## 2018-05-17 MED ORDER — ACETAMINOPHEN 10 MG/ML IV SOLN
INTRAVENOUS | Status: AC
  Filled 2018-05-17: qty 100

## 2018-05-17 MED ORDER — FERROUS SULFATE 325 (65 FE) MG PO TABS
325.0000 mg | ORAL_TABLET | Freq: Two times a day (BID) | ORAL | Status: DC
  Administered 2018-05-17 – 2018-05-19 (×4): 325 mg via ORAL
  Filled 2018-05-17 (×4): qty 1

## 2018-05-17 MED ORDER — FENTANYL CITRATE (PF) 100 MCG/2ML IJ SOLN: 25.0000 ug | INTRAMUSCULAR | Status: DC | PRN

## 2018-05-17 MED ORDER — MENTHOL 3 MG MT LOZG: 1.0000 | LOZENGE | OROMUCOSAL | Status: DC | PRN

## 2018-05-17 MED ORDER — CALCIUM CARBONATE ANTACID 500 MG PO CHEW
400.0000 mg | CHEWABLE_TABLET | Freq: Every day | ORAL | Status: DC
  Administered 2018-05-17 – 2018-05-19 (×2): 400 mg via ORAL
  Filled 2018-05-17 (×3): qty 2

## 2018-05-17 MED ORDER — PROPOFOL 10 MG/ML IV BOLUS
INTRAVENOUS | Status: DC | PRN
  Administered 2018-05-17: 15 mg via INTRAVENOUS
  Administered 2018-05-17: 30 mg via INTRAVENOUS

## 2018-05-17 MED ORDER — SODIUM CHLORIDE 0.9 % IV SOLN
INTRAVENOUS | Status: DC | PRN
  Administered 2018-05-17: 60 mL

## 2018-05-17 MED ORDER — MIDAZOLAM HCL 5 MG/5ML IJ SOLN
INTRAMUSCULAR | Status: DC | PRN
  Administered 2018-05-17: 0.5 mg via INTRAVENOUS

## 2018-05-17 MED ORDER — DEXAMETHASONE SODIUM PHOSPHATE 10 MG/ML IJ SOLN
8.0000 mg | Freq: Once | INTRAMUSCULAR | Status: AC
  Administered 2018-05-17: 8 mg via INTRAVENOUS

## 2018-05-17 MED ORDER — BUPIVACAINE HCL (PF) 0.25 % IJ SOLN
INTRAMUSCULAR | Status: DC | PRN
  Administered 2018-05-17: 60 mL

## 2018-05-17 MED ORDER — BISACODYL 10 MG RE SUPP
10.0000 mg | Freq: Every day | RECTAL | Status: DC | PRN
  Filled 2018-05-17: qty 1

## 2018-05-17 MED ORDER — OXYCODONE HCL 5 MG PO TABS
5.0000 mg | ORAL_TABLET | ORAL | Status: DC | PRN
  Administered 2018-05-17 – 2018-05-19 (×5): 5 mg via ORAL
  Filled 2018-05-17 (×5): qty 1

## 2018-05-17 MED ORDER — LACTATED RINGERS IV SOLN
INTRAVENOUS | Status: DC
  Administered 2018-05-17 (×2): via INTRAVENOUS

## 2018-05-17 MED ORDER — VITAMIN C 500 MG PO TABS
2000.0000 mg | ORAL_TABLET | Freq: Every day | ORAL | Status: DC
  Administered 2018-05-17 – 2018-05-19 (×3): 2000 mg via ORAL
  Filled 2018-05-17 (×3): qty 4

## 2018-05-17 MED ORDER — HYDROMORPHONE HCL 1 MG/ML IJ SOLN: 0.5000 mg | INTRAMUSCULAR | Status: DC | PRN

## 2018-05-17 MED ORDER — PHENOL 1.4 % MT LIQD: 1.0000 | OROMUCOSAL | Status: DC | PRN

## 2018-05-17 MED ORDER — ACETAMINOPHEN 325 MG PO TABS: 325.0000 mg | ORAL_TABLET | Freq: Four times a day (QID) | ORAL | Status: DC | PRN

## 2018-05-17 MED ORDER — MAGNESIUM OXIDE 400 (241.3 MG) MG PO TABS
600.0000 mg | ORAL_TABLET | Freq: Every day | ORAL | Status: DC
  Administered 2018-05-17 – 2018-05-19 (×3): 600 mg via ORAL
  Filled 2018-05-17 (×3): qty 2

## 2018-05-17 MED ORDER — FENTANYL CITRATE (PF) 100 MCG/2ML IJ SOLN
INTRAMUSCULAR | Status: AC
  Filled 2018-05-17: qty 2

## 2018-05-17 MED ORDER — TRANEXAMIC ACID-NACL 1000-0.7 MG/100ML-% IV SOLN
1000.0000 mg | Freq: Once | INTRAVENOUS | Status: AC
  Administered 2018-05-17: 1000 mg via INTRAVENOUS
  Filled 2018-05-17: qty 100

## 2018-05-17 MED ORDER — PHENYLEPHRINE HCL 10 MG/ML IJ SOLN
INTRAMUSCULAR | Status: AC
  Filled 2018-05-17: qty 1

## 2018-05-17 MED ORDER — RISAQUAD PO CAPS
1.0000 | ORAL_CAPSULE | Freq: Every day | ORAL | Status: DC
  Administered 2018-05-18: 1 via ORAL
  Filled 2018-05-17 (×3): qty 1

## 2018-05-17 MED ORDER — CELECOXIB 200 MG PO CAPS
ORAL_CAPSULE | ORAL | Status: AC
  Administered 2018-05-17: 400 mg via ORAL
  Filled 2018-05-17: qty 2

## 2018-05-17 MED ORDER — CLINDAMYCIN PHOSPHATE 600 MG/50ML IV SOLN
600.0000 mg | Freq: Four times a day (QID) | INTRAVENOUS | Status: AC
  Administered 2018-05-17 – 2018-05-18 (×4): 600 mg via INTRAVENOUS
  Filled 2018-05-17 (×4): qty 50

## 2018-05-17 MED ORDER — METOCLOPRAMIDE HCL 5 MG/ML IJ SOLN: 5.0000 mg | Freq: Three times a day (TID) | INTRAMUSCULAR | Status: DC | PRN

## 2018-05-17 MED ORDER — CELECOXIB 200 MG PO CAPS
400.0000 mg | ORAL_CAPSULE | Freq: Once | ORAL | Status: AC
  Administered 2018-05-17: 400 mg via ORAL

## 2018-05-17 MED ORDER — LIDOCAINE HCL (PF) 2 % IJ SOLN
INTRAMUSCULAR | Status: AC
  Filled 2018-05-17: qty 10

## 2018-05-17 MED ORDER — TRAMADOL HCL 50 MG PO TABS
50.0000 mg | ORAL_TABLET | ORAL | Status: DC | PRN
  Administered 2018-05-17 – 2018-05-19 (×3): 50 mg via ORAL
  Filled 2018-05-17 (×3): qty 1

## 2018-05-17 MED ORDER — ONDANSETRON HCL 4 MG PO TABS: 4.0000 mg | ORAL_TABLET | Freq: Four times a day (QID) | ORAL | Status: DC | PRN

## 2018-05-17 MED ORDER — METOCLOPRAMIDE HCL 10 MG PO TABS
10.0000 mg | ORAL_TABLET | Freq: Three times a day (TID) | ORAL | Status: AC
  Administered 2018-05-17 – 2018-05-19 (×7): 10 mg via ORAL
  Filled 2018-05-17 (×7): qty 1

## 2018-05-17 MED ORDER — ACETAMINOPHEN 10 MG/ML IV SOLN
1000.0000 mg | Freq: Four times a day (QID) | INTRAVENOUS | Status: AC
  Administered 2018-05-17 – 2018-05-18 (×3): 1000 mg via INTRAVENOUS
  Filled 2018-05-17 (×4): qty 100

## 2018-05-17 MED ORDER — CALCIUM CARBONATE-VITAMIN D 500-200 MG-UNIT PO TABS
1.0000 | ORAL_TABLET | Freq: Every day | ORAL | Status: DC
  Administered 2018-05-18 – 2018-05-19 (×2): 1 via ORAL
  Filled 2018-05-17 (×2): qty 1

## 2018-05-17 MED ORDER — ATORVASTATIN CALCIUM 80 MG PO TABS
80.0000 mg | ORAL_TABLET | Freq: Every day | ORAL | Status: DC
  Administered 2018-05-18: 80 mg via ORAL
  Filled 2018-05-17: qty 1
  Filled 2018-05-17: qty 4
  Filled 2018-05-17 (×2): qty 1
  Filled 2018-05-17: qty 4

## 2018-05-17 MED ORDER — DEXAMETHASONE SODIUM PHOSPHATE 10 MG/ML IJ SOLN
INTRAMUSCULAR | Status: AC
  Administered 2018-05-17: 8 mg via INTRAVENOUS
  Filled 2018-05-17: qty 1

## 2018-05-17 MED ORDER — FLEET ENEMA 7-19 GM/118ML RE ENEM: 1.0000 | ENEMA | Freq: Once | RECTAL | Status: DC | PRN

## 2018-05-17 MED ORDER — MAGNESIUM HYDROXIDE 400 MG/5ML PO SUSP
30.0000 mL | Freq: Every day | ORAL | Status: DC
  Administered 2018-05-18: 30 mL via ORAL
  Filled 2018-05-17: qty 30

## 2018-05-17 MED ORDER — OXYCODONE HCL 5 MG PO TABS
10.0000 mg | ORAL_TABLET | ORAL | Status: DC | PRN
  Administered 2018-05-17: 10 mg via ORAL
  Filled 2018-05-17: qty 2

## 2018-05-17 MED ORDER — ADULT MULTIVITAMIN W/MINERALS CH
1.0000 | ORAL_TABLET | Freq: Every day | ORAL | Status: DC
  Administered 2018-05-17 – 2018-05-19 (×3): 1 via ORAL
  Filled 2018-05-17 (×3): qty 1

## 2018-05-17 MED ORDER — METOCLOPRAMIDE HCL 10 MG PO TABS: 5.0000 mg | ORAL_TABLET | Freq: Three times a day (TID) | ORAL | Status: DC | PRN

## 2018-05-17 MED ORDER — MIDAZOLAM HCL 2 MG/2ML IJ SOLN
INTRAMUSCULAR | Status: AC
  Filled 2018-05-17: qty 2

## 2018-05-17 MED ORDER — ONDANSETRON HCL 4 MG/2ML IJ SOLN: 4.0000 mg | Freq: Once | INTRAMUSCULAR | Status: DC | PRN

## 2018-05-17 MED ORDER — ALUM & MAG HYDROXIDE-SIMETH 200-200-20 MG/5ML PO SUSP: 30.0000 mL | ORAL | Status: DC | PRN

## 2018-05-17 MED ORDER — ENOXAPARIN SODIUM 30 MG/0.3ML ~~LOC~~ SOLN
30.0000 mg | Freq: Two times a day (BID) | SUBCUTANEOUS | Status: DC
  Administered 2018-05-18 – 2018-05-19 (×3): 30 mg via SUBCUTANEOUS
  Filled 2018-05-17 (×3): qty 0.3

## 2018-05-17 MED ORDER — DIPHENHYDRAMINE HCL 12.5 MG/5ML PO ELIX: 12.5000 mg | ORAL_SOLUTION | ORAL | Status: DC | PRN

## 2018-05-17 MED ORDER — GABAPENTIN 300 MG PO CAPS
ORAL_CAPSULE | ORAL | Status: AC
  Administered 2018-05-17: 300 mg via ORAL
  Filled 2018-05-17: qty 1

## 2018-05-17 MED ORDER — ACETAMINOPHEN 10 MG/ML IV SOLN
INTRAVENOUS | Status: DC | PRN
  Administered 2018-05-17: 1000 mg via INTRAVENOUS

## 2018-05-17 MED ORDER — CLINDAMYCIN PHOSPHATE 900 MG/50ML IV SOLN
INTRAVENOUS | Status: AC
  Filled 2018-05-17: qty 50

## 2018-05-17 MED ORDER — GABAPENTIN 300 MG PO CAPS
300.0000 mg | ORAL_CAPSULE | Freq: Every day | ORAL | Status: DC
  Administered 2018-05-17 – 2018-05-18 (×2): 300 mg via ORAL
  Filled 2018-05-17 (×2): qty 1

## 2018-05-17 MED ORDER — SENNOSIDES-DOCUSATE SODIUM 8.6-50 MG PO TABS
1.0000 | ORAL_TABLET | Freq: Two times a day (BID) | ORAL | Status: DC
  Administered 2018-05-17 – 2018-05-19 (×5): 1 via ORAL
  Filled 2018-05-17 (×5): qty 1

## 2018-05-17 MED ORDER — PANTOPRAZOLE SODIUM 40 MG PO TBEC
40.0000 mg | DELAYED_RELEASE_TABLET | Freq: Two times a day (BID) | ORAL | Status: DC
  Administered 2018-05-17 – 2018-05-19 (×5): 40 mg via ORAL
  Filled 2018-05-17 (×5): qty 1

## 2018-05-17 MED ORDER — CELECOXIB 200 MG PO CAPS
200.0000 mg | ORAL_CAPSULE | Freq: Two times a day (BID) | ORAL | Status: DC
  Administered 2018-05-17 – 2018-05-19 (×5): 200 mg via ORAL
  Filled 2018-05-17 (×5): qty 1

## 2018-05-17 MED ORDER — FENTANYL CITRATE (PF) 100 MCG/2ML IJ SOLN
INTRAMUSCULAR | Status: DC | PRN
  Administered 2018-05-17: 75 ug via INTRAVENOUS

## 2018-05-17 MED ORDER — ONDANSETRON HCL 4 MG/2ML IJ SOLN: 4.0000 mg | Freq: Four times a day (QID) | INTRAMUSCULAR | Status: DC | PRN

## 2018-05-17 MED ORDER — PROPOFOL 500 MG/50ML IV EMUL
INTRAVENOUS | Status: DC | PRN
  Administered 2018-05-17: 60 ug/kg/min via INTRAVENOUS

## 2018-05-17 MED ORDER — TETRACAINE HCL 1 % IJ SOLN
INTRAMUSCULAR | Status: DC | PRN
  Administered 2018-05-17: 4 mg via INTRASPINAL

## 2018-05-17 MED ORDER — GABAPENTIN 300 MG PO CAPS
300.0000 mg | ORAL_CAPSULE | Freq: Once | ORAL | Status: AC
  Administered 2018-05-17: 300 mg via ORAL

## 2018-05-17 MED ORDER — GLYCOPYRROLATE 0.2 MG/ML IJ SOLN
INTRAMUSCULAR | Status: AC
  Filled 2018-05-17: qty 1

## 2018-05-17 MED ORDER — BUPIVACAINE HCL (PF) 0.5 % IJ SOLN
INTRAMUSCULAR | Status: AC
  Filled 2018-05-17: qty 10

## 2018-05-17 MED ORDER — AZELASTINE HCL 0.1 % NA SOLN: 1.0000 | Freq: Two times a day (BID) | NASAL | Status: DC | PRN

## 2018-05-17 MED ORDER — SODIUM CHLORIDE 0.9 % IV SOLN
INTRAVENOUS | Status: DC | PRN
  Administered 2018-05-17: 30 ug/min via INTRAVENOUS

## 2018-05-17 MED ORDER — BUPIVACAINE HCL (PF) 0.5 % IJ SOLN
INTRAMUSCULAR | Status: DC | PRN
  Administered 2018-05-17: 2.6 mL

## 2018-05-17 MED ORDER — GLYCOPYRROLATE 0.2 MG/ML IJ SOLN
INTRAMUSCULAR | Status: DC | PRN
  Administered 2018-05-17: 0.2 mg via INTRAVENOUS

## 2018-05-17 MED ORDER — OMEGA-3-ACID ETHYL ESTERS 1 G PO CAPS
1.0000 g | ORAL_CAPSULE | Freq: Every day | ORAL | Status: DC
  Administered 2018-05-17 – 2018-05-19 (×3): 1 g via ORAL
  Filled 2018-05-17 (×3): qty 1

## 2018-05-17 MED ORDER — LATANOPROST 0.005 % OP SOLN
1.0000 [drp] | Freq: Every day | OPHTHALMIC | Status: DC
  Administered 2018-05-17 – 2018-05-18 (×2): 1 [drp] via OPHTHALMIC
  Filled 2018-05-17: qty 2.5

## 2018-05-17 MED ORDER — NEOMYCIN-POLYMYXIN B GU 40-200000 IR SOLN
Status: DC | PRN
  Administered 2018-05-17: 14 mL

## 2018-05-17 MED ORDER — SODIUM CHLORIDE 0.9 % IV SOLN
INTRAVENOUS | Status: DC
  Administered 2018-05-17 (×2): via INTRAVENOUS

## 2018-05-17 MED ORDER — TAMSULOSIN HCL 0.4 MG PO CAPS
0.4000 mg | ORAL_CAPSULE | Freq: Every day | ORAL | Status: DC
  Administered 2018-05-17 – 2018-05-18 (×2): 0.4 mg via ORAL
  Filled 2018-05-17 (×2): qty 1

## 2018-05-17 MED ORDER — DIPHENHYDRAMINE HCL 25 MG PO CAPS: 25.0000 mg | ORAL_CAPSULE | Freq: Every evening | ORAL | Status: DC | PRN

## 2018-05-17 SURGICAL SUPPLY — 70 items
AMK fixation pins ×2 IMPLANT
BATTERY INSTRU NAVIGATION (MISCELLANEOUS) ×8 IMPLANT
BLADE SAW 70X12.5 (BLADE) IMPLANT
BLADE SAW 90X13X1.19 OSCILLAT (BLADE) ×2 IMPLANT
BLADE SAW 90X25X1.19 OSCILLAT (BLADE) ×2 IMPLANT
CANISTER SUCT 1200ML W/VALVE (MISCELLANEOUS) IMPLANT
CANISTER SUCT 3000ML PPV (MISCELLANEOUS) ×2 IMPLANT
CEMENT TIBIA MBT SIZE 5 (Knees) ×1 IMPLANT
COOLER POLAR GLACIER W/PUMP (MISCELLANEOUS) ×2 IMPLANT
COVER WAND RF STERILE (DRAPES) ×2 IMPLANT
CUFF TOURN 24 STER (MISCELLANEOUS) ×2 IMPLANT
CUFF TOURN 30 STER DUAL PORT (MISCELLANEOUS) IMPLANT
DRAPE SHEET LG 3/4 BI-LAMINATE (DRAPES) ×2 IMPLANT
DRSG DERMACEA 8X12 NADH (GAUZE/BANDAGES/DRESSINGS) ×2 IMPLANT
DRSG OPSITE POSTOP 4X14 (GAUZE/BANDAGES/DRESSINGS) ×2 IMPLANT
DRSG TEGADERM 4X4.75 (GAUZE/BANDAGES/DRESSINGS) ×2 IMPLANT
DURAPREP 26ML APPLICATOR (WOUND CARE) ×2 IMPLANT
ELECT CAUTERY BLADE 6.4 (BLADE) ×2 IMPLANT
ELECT REM PT RETURN 9FT ADLT (ELECTROSURGICAL) ×2
ELECTRODE REM PT RTRN 9FT ADLT (ELECTROSURGICAL) ×1 IMPLANT
EX-PIN ORTHOLOCK NAV 4X150 (PIN) ×4 IMPLANT
FEMUR SIGMA PS SZ 4.0 R (Femur) ×2 IMPLANT
GLOVE BIOGEL M STRL SZ7.5 (GLOVE) ×6 IMPLANT
GLOVE BIOGEL PI IND STRL 9 (GLOVE) ×1 IMPLANT
GLOVE BIOGEL PI INDICATOR 9 (GLOVE) ×1
GLOVE INDICATOR 8.0 STRL GRN (GLOVE) ×8 IMPLANT
GLOVE SURG SYN 9.0  PF PI (GLOVE) ×4
GLOVE SURG SYN 9.0 PF PI (GLOVE) ×4 IMPLANT
GOWN STRL REUS W/ TWL LRG LVL3 (GOWN DISPOSABLE) ×2 IMPLANT
GOWN STRL REUS W/TWL 2XL LVL3 (GOWN DISPOSABLE) ×2 IMPLANT
GOWN STRL REUS W/TWL LRG LVL3 (GOWN DISPOSABLE) ×2
HEMOVAC 400CC 10FR (MISCELLANEOUS) ×2 IMPLANT
HOLDER FOLEY CATH W/STRAP (MISCELLANEOUS) ×2 IMPLANT
HOOD PEEL AWAY FLYTE STAYCOOL (MISCELLANEOUS) ×4 IMPLANT
KIT TURNOVER KIT A (KITS) ×2 IMPLANT
KNIFE SCULPS 14X20 (INSTRUMENTS) ×2 IMPLANT
LABEL OR SOLS (LABEL) ×2 IMPLANT
NDL SAFETY ECLIPSE 18X1.5 (NEEDLE) ×1 IMPLANT
NEEDLE HYPO 18GX1.5 SHARP (NEEDLE) ×1
NEEDLE SPNL 20GX3.5 QUINCKE YW (NEEDLE) ×4 IMPLANT
NS IRRIG 500ML POUR BTL (IV SOLUTION) ×2 IMPLANT
PACK TOTAL KNEE (MISCELLANEOUS) ×2 IMPLANT
PAD WRAPON POLAR KNEE (MISCELLANEOUS) ×1 IMPLANT
PATELLA DOME PFC 41MM (Knees) ×2 IMPLANT
PENCIL SMOKE ULTRAEVAC 22 CON (MISCELLANEOUS) ×2 IMPLANT
PIN DRILL QUICK PACK ×2 IMPLANT
PIN FIXATION 1/8DIA X 3INL (PIN) ×6 IMPLANT
PIN FIXATION 1/8X3 AMK (Pin) ×2 IMPLANT
PLATE ROT INSERT 10MM SIZE 4 (Plate) ×2 IMPLANT
PULSAVAC PLUS IRRIG FAN TIP (DISPOSABLE) ×2
SOL .9 NS 3000ML IRR  AL (IV SOLUTION) ×1
SOL .9 NS 3000ML IRR UROMATIC (IV SOLUTION) ×1 IMPLANT
SOL PREP PVP 2OZ (MISCELLANEOUS) ×2
SOLUTION PREP PVP 2OZ (MISCELLANEOUS) ×1 IMPLANT
SPONGE DRAIN TRACH 4X4 STRL 2S (GAUZE/BANDAGES/DRESSINGS) ×2 IMPLANT
STAPLER SKIN PROX 35W (STAPLE) ×2 IMPLANT
STRAP TIBIA SHORT (MISCELLANEOUS) ×2 IMPLANT
SUCTION FRAZIER HANDLE 10FR (MISCELLANEOUS) ×1
SUCTION TUBE FRAZIER 10FR DISP (MISCELLANEOUS) ×1 IMPLANT
SUT VIC AB 0 CT1 36 (SUTURE) ×2 IMPLANT
SUT VIC AB 1 CT1 36 (SUTURE) ×4 IMPLANT
SUT VIC AB 2-0 CT2 27 (SUTURE) ×2 IMPLANT
SYR 20CC LL (SYRINGE) ×2 IMPLANT
SYR 30ML LL (SYRINGE) ×4 IMPLANT
TIBIA MBT CEMENT SIZE 5 (Knees) ×2 IMPLANT
TIP FAN IRRIG PULSAVAC PLUS (DISPOSABLE) ×1 IMPLANT
TOWEL OR 17X26 4PK STRL BLUE (TOWEL DISPOSABLE) ×2 IMPLANT
TOWER CARTRIDGE SMART MIX (DISPOSABLE) ×2 IMPLANT
TRAY FOLEY MTR SLVR 16FR STAT (SET/KITS/TRAYS/PACK) ×2 IMPLANT
WRAPON POLAR PAD KNEE (MISCELLANEOUS) ×2

## 2018-05-17 NOTE — NC FL2 (Signed)
[place] LEVEL OF CARE SCREENING TOOL     IDENTIFICATION  Patient Name: Wesley [name] Birthdate: 1940/07/07 Sex: male Admission Date (Current Location): 05/17/2018  [place] and [place] Number:  Engineering geologist and Address:  [facility], [address] [place], [place] [address]      Provider Number: [id number]  Attending Physician Name and Address:  Dereck Leep, MD  Relative Name and Phone Number:       Current Level of Care: Hospital Recommended Level of Care: [place] Prior Approval Number:    Date Approved/Denied:   PASRR Number: [id number])  Discharge Plan: SNF    Current Diagnoses: Patient Active Problem List   Diagnosis Date Noted  . Ganglion and cyst of synovium, tendon and bursa 05/17/2018  . CAD (coronary artery disease) 05/17/2018  . Carpal tunnel syndrome 05/17/2018  . Chronic back pain 05/17/2018  . History of stroke 05/17/2018  . Synovitis and tenosynovitis, unspecified 05/17/2018  . Vitamin D deficiency 05/17/2018  . 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 Lymphocytosis 02/21/2016  . Malignant neoplasm of prostate ([place]) 11/20/2015  . TIA 03/27/2010  . HYPERLIPIDEMIA 10/04/2009  . HYPERTENSION 10/04/2009  . 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 GERD 10/04/2009  . PYELONEPHRITIS 10/04/2009  . PSA, INCREASED 10/04/2009  . 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 HEMORRHOIDECTOMY, HX OF 10/04/2009    Orientation RESPIRATION BLADDER Height & Weight     Self, Time, Situation, Place  Normal Incontinent Weight:   Height:     BEHAVIORAL SYMPTOMS/MOOD NEUROLOGICAL BOWEL NUTRITION STATUS      Continent Diet(Diet: Regular )  AMBULATORY STATUS COMMUNICATION OF NEEDS Skin   Extensive Assist Verbally Surgical wounds(Incision: Right Knee. )                       Personal Care Assistance Level of Assistance  Bathing, Feeding, Dressing Bathing Assistance: Limited  assistance Feeding assistance: Independent Dressing Assistance: Limited assistance     Functional Limitations Info  Sight, Hearing, Speech Sight Info: Adequate Hearing Info: Adequate Speech Info: Adequate    SPECIAL CARE FACTORS FREQUENCY  PT (By licensed PT), OT (By licensed OT)     PT Frequency: (5) OT Frequency: (5)            Contractures      Additional Factors Info  Code Status, Allergies Code Status Info: (Full Code. ) Allergies Info: (Amoxicillin)           Current Medications (05/17/2018):  This is the current hospital active medication list Current Facility-Administered Medications  Medication Dose Route Frequency Provider Last Rate Last Dose  . 0.9 %  sodium chloride infusion   Intravenous Continuous Hooten, Laurice Record, MD      . acetaminophen (OFIRMEV) IV 1,000 mg  1,000 mg Intravenous Q6H Hooten, Laurice Record, MD      . Derrill Memo ON 05/18/2018] acetaminophen (TYLENOL) tablet 325-650 mg  325-650 mg Oral Q6H PRN Hooten, Laurice Record, MD      . acidophilus (RISAQUAD) capsule 1 capsule  1 capsule Oral Daily Hooten, Laurice Record, MD      . alum & mag hydroxide-simeth (MAALOX/MYLANTA) 200-200-20 MG/5ML suspension 30 mL  30 mL Oral Q4H PRN Hooten, Laurice Record, MD      . atorvastatin (LIPITOR) tablet 80 mg  80 mg Oral Daily Hooten, Laurice Record, MD      . azelastine (ASTELIN) 0.1 % nasal spray 1 spray  1 spray Each Nare BID PRN Hooten, Laurice Record, MD      . bisacodyl (DULCOLAX) suppository 10 mg  10 mg Rectal Daily PRN Hooten, Laurice Record, MD      . CALCIUM 1200 1200-1000 MG-UNIT CHEW 1 tablet  1 tablet Oral Daily Hooten, Laurice Record, MD      . calcium carbonate (TUMS - dosed in mg elemental calcium) chewable tablet 400 mg of elemental calcium  400 mg of elemental calcium Oral Daily Hooten, Laurice Record, MD      . celecoxib (CELEBREX) capsule 200 mg  200 mg Oral BID Hooten, Laurice Record, MD      . clindamycin (CLEOCIN) IVPB 600 mg  600 mg Intravenous Q6H Hooten, Laurice Record, MD      . diphenhydrAMINE (BENADRYL) 12.5  MG/5ML elixir 12.5-25 mg  12.5-25 mg Oral Q4H PRN Hooten, Laurice Record, MD      . diphenhydrAMINE (BENADRYL) capsule 25 mg  25 mg Oral QHS PRN Hooten, Laurice Record, MD      . Derrill Memo ON 05/18/2018] enoxaparin (LOVENOX) injection 30 mg  30 mg Subcutaneous Q12H Hooten, Laurice Record, MD      . ferrous sulfate tablet 325 mg  325 mg Oral BID WC Hooten, Laurice Record, MD      . gabapentin (NEURONTIN) capsule 300 mg  300 mg Oral QHS Hooten, Laurice Record, MD      . HYDROmorphone (DILAUDID) injection 0.5-1 mg  0.5-1 mg Intravenous Q4H PRN Hooten, Laurice Record, MD      . latanoprost (XALATAN) 0.005 % ophthalmic solution 1 drop  1 drop Both Eyes QHS Hooten, Laurice Record, MD      . magnesium hydroxide (MILK OF MAGNESIA) suspension 30 mL  30 mL Oral Daily Hooten, Laurice Record, MD      . magnesium oxide (MAG-OX) tablet 600 mg  600 mg Oral Daily Hooten, Laurice Record, MD      . menthol-cetylpyridinium (CEPACOL) lozenge 3 mg  1 lozenge Oral PRN Hooten, Laurice Record, MD       Or  . phenol (CHLORASEPTIC) mouth spray 1 spray  1 spray Mouth/Throat PRN Hooten, Laurice Record, MD      . metoCLOPramide (REGLAN) tablet 5-10 mg  5-10 mg Oral Q8H PRN Hooten, Laurice Record, MD       Or  . metoCLOPramide (REGLAN) injection 5-10 mg  5-10 mg Intravenous Q8H PRN Hooten, Laurice Record, MD      . metoCLOPramide (REGLAN) tablet 10 mg  10 mg Oral TID AC & HS Hooten, Laurice Record, MD      . multivitamin with minerals tablet 1 tablet  1 tablet Oral Daily Hooten, Laurice Record, MD      . omega-3 acid ethyl esters (LOVAZA) capsule 1 g  1 g Oral Daily Hooten, Laurice Record, MD      . ondansetron (ZOFRAN) tablet 4 mg  4 mg Oral Q6H PRN Hooten, Laurice Record, MD       Or  . ondansetron (ZOFRAN) injection 4 mg  4 mg Intravenous Q6H PRN Hooten, Laurice Record, MD      . oxyCODONE (Oxy IR/ROXICODONE) immediate release tablet 10 mg  10 mg Oral Q4H PRN Hooten, Laurice Record, MD      . oxyCODONE (Oxy IR/ROXICODONE) immediate release tablet 5 mg  5 mg Oral Q4H PRN Hooten, Laurice Record, MD      . pantoprazole (PROTONIX) EC tablet 40 mg  40 mg Oral BID  Hooten, Laurice Record, MD      .  senna-docusate (Senokot-S) tablet 1 tablet  1 tablet Oral BID Hooten, Laurice Record, MD      . sodium phosphate (FLEET) 7-19 GM/118ML enema 1 enema  1 enema Rectal Once PRN Hooten, Laurice Record, MD      . tamsulosin (FLOMAX) capsule 0.4 mg  0.4 mg Oral QHS Hooten, Laurice Record, MD      . traMADol (ULTRAM) tablet 50-100 mg  50-100 mg Oral Q4H PRN Hooten, Laurice Record, MD      . vitamin C (ASCORBIC ACID) tablet 2,000 mg  2,000 mg Oral Daily Hooten, Laurice Record, MD         Discharge Medications: Please see discharge summary for a list of discharge medications.  Relevant Imaging Results:  Relevant Lab Results:   Additional Information (SSN: [id number])  Rabecca Birge, Veronia Beets, LCSW

## 2018-05-17 NOTE — Transfer of Care (Signed)
Immediate Anesthesia Transfer of Care Note  Patient: Wesley [name]  Procedure(s) Performed: COMPUTER ASSISTED TOTAL KNEE ARTHROPLASTY (Right )  Patient Location: PACU  Anesthesia Type:Spinal  Level of Consciousness: drowsy  Airway & Oxygen Therapy: Patient Spontanous Breathing and Patient connected to nasal cannula oxygen  Post-op Assessment: Report given to RN and Post -op Vital signs reviewed and stable  Post vital signs: Reviewed and stable  Last Vitals:  Vitals Value Taken Time  BP    Temp    Pulse    Resp    SpO2      Last Pain:  Vitals:   05/17/18 0648  TempSrc: Oral  PainSc: 7       Patients Stated Pain Goal: 3 (89/48/34 7583)  Complications: No apparent anesthesia complications

## 2018-05-17 NOTE — Progress Notes (Signed)
PT Cancellation Note  Patient Details Name: Wesley [name] MRN: [id number] DOB: [birth year]   Cancelled Treatment:    Reason Eval/Treat Not Completed: Medical issues which prohibited therapy.  Attempted to see pt for PT evaluation; however, pt's sensation has not yet returned s/p R TKA.    Collie Siad PT, DPT 05/17/2018, 2:21 PM

## 2018-05-17 NOTE — Anesthesia Preprocedure Evaluation (Signed)
Anesthesia Evaluation  Patient identified by MRN, date of birth, ID band Patient awake    Reviewed: Allergy & Precautions, NPO status , Patient's Chart, lab work & pertinent test results  History of Anesthesia Complications Negative for: history of anesthetic complications  Airway Mallampati: II  TM Distance: >3 FB     Dental  (+) Teeth Intact, Dental Advidsory Given Permanent bridge on the top right:   Pulmonary neg shortness of breath, sleep apnea , neg COPD, Recent URI , Resolved,           Cardiovascular Exercise Tolerance: Good hypertension, Pt. 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[place]) No date: Chronic back pain No date: Coronary atherosclerosis of autologous vein bypass graft No date: Essential hypertension, benign     Comment:  pt denies 04/07/14 No date: GERD (gastroesophageal reflux disease) No date: Hyperlipidemia No date: Intermittent vertigo No date: Labral tear of shoulder     Comment:  w/ fracture No date: Macular degeneration No date: OSA (obstructive sleep apnea)     Comment:  sleep study 06/03/2012 - AHI 54.74/hr during total               sleep, AHI 49.66/hr during REM No date: Prostate cancer ([place]) 2005: S/P CABG x 4 No date: Stroke ([place]) No date: Synovitis and tenosynovitis, unspecified No date: Tubular adenoma of colon No date: Unspecified vitamin D deficiency   Reproductive/Obstetrics                             Anesthesia Physical  Anesthesia Plan  ASA: III  Anesthesia Plan: Spinal   Post-op Pain Management:    Induction: Intravenous  PONV Risk Score and Plan: Propofol infusion and TIVA  Airway Management Planned: Nasal Cannula and Simple Face Mask  Additional Equipment:   Intra-op Plan:   Post-operative Plan:   Informed Consent: I have reviewed the patients History and Physical, chart, labs and discussed the procedure including the risks, benefits and alternatives for the proposed anesthesia with the patient or authorized representative who has indicated his/her understanding and acceptance.   Dental advisory given  Plan Discussed with: CRNA and Surgeon  Anesthesia Plan Comments:         Anesthesia Quick Evaluation

## 2018-05-17 NOTE — Anesthesia Procedure Notes (Signed)
Spinal  Patient location during procedure: OR Start time: 05/17/2018 7:36 AM End time: 05/17/2018 7:39 AM Staffing Resident/CRNA: Bernardo Heater, CRNA Performed: resident/CRNA  Preanesthetic Checklist Completed: patient identified, site marked, surgical consent, pre-op evaluation, timeout performed, IV checked, risks and benefits discussed and monitors and equipment checked Spinal Block Patient position: sitting Prep: ChloraPrep Patient monitoring: heart rate, continuous pulse ox, blood pressure and cardiac monitor Approach: midline Location: L3-4 Injection technique: single-shot Needle Needle type: Introducer and Pencan  Needle gauge: 24 G Needle length: 9 cm Additional Notes Negative paresthesia. Negative blood return. Positive free-flowing CSF. Expiration date of kit checked and confirmed. Patient tolerated procedure well, without complications.

## 2018-05-17 NOTE — Op Note (Signed)
OPERATIVE NOTE  DATE OF SURGERY:  05/17/2018  PATIENT NAME:  Wesley [name]   DOB: 01/23/1941  MRN: [id number]  PRE-OPERATIVE DIAGNOSIS: Degenerative arthrosis of the right knee, primary  POST-OPERATIVE DIAGNOSIS:  Same  PROCEDURE:  Right total knee arthroplasty using computer-assisted navigation  SURGEON:  Marciano Sequin. M.D.  ASSISTANT:  Vance Peper, PA (present and scrubbed throughout the case, critical for assistance with exposure, retraction, instrumentation, and closure)  ANESTHESIA: spinal  ESTIMATED BLOOD LOSS: 50 mL  FLUIDS REPLACED: 1200 mL of crystalloid  TOURNIQUET TIME: 92 minutes  DRAINS: 2 medium Hemovac drains  SOFT TISSUE RELEASES: Anterior cruciate ligament, posterior cruciate ligament, deep and superficial medial collateral ligament, patellofemoral ligament  IMPLANTS UTILIZED: DePuy PFC Sigma size 4 posterior stabilized femoral component (cemented), size 5 MBT tibial component (cemented), 41 mm 3 peg oval dome patella (cemented), and a 10 mm stabilized rotating platform polyethylene insert.  INDICATIONS FOR SURGERY: AEDIN [name] is a 77 y.o. year old male with a long history of progressive knee pain. X-rays demonstrated severe degenerative changes in tricompartmental fashion. The patient had not seen any significant improvement despite conservative nonsurgical intervention. After discussion of the risks and benefits of surgical intervention, the patient expressed understanding of the risks benefits and agree with plans for total knee arthroplasty.   The risks, benefits, and alternatives were discussed at length including but not limited to the risks of infection, bleeding, nerve injury, stiffness, blood clots, the need for revision surgery, cardiopulmonary complications, among others, and they were willing to proceed.  PROCEDURE IN DETAIL: The patient was brought into the operating room and, after adequate spinal anesthesia was achieved, a  tourniquet was placed on the patient's upper thigh. The patient's knee and leg were cleaned and prepped with alcohol and DuraPrep and draped in the usual sterile fashion. A "timeout" was performed as per usual protocol. The lower extremity was exsanguinated using an Esmarch, and the tourniquet was inflated to 300 mmHg. An anterior longitudinal incision was made followed by a standard mid vastus approach. The deep fibers of the medial collateral ligament were elevated in a subperiosteal fashion off of the medial flare of the tibia so as to maintain a continuous soft tissue sleeve. The patella was subluxed laterally and the patellofemoral ligament was incised. Inspection of the knee demonstrated severe degenerative changes with full-thickness loss of articular cartilage. Osteophytes were debrided using a rongeur. Anterior and posterior cruciate ligaments were excised. Two 4.0 mm Schanz pins were inserted in the femur and into the tibia for attachment of the array of trackers used for computer-assisted navigation. Hip center was identified using a circumduction technique. Distal landmarks were mapped using the computer. The distal femur and proximal tibia were mapped using the computer. The distal femoral cutting guide was positioned using computer-assisted navigation so as to achieve a 5 distal valgus cut. The femur was sized and it was felt that a size 4 femoral component was appropriate. A size 4 femoral cutting guide was positioned and the anterior cut was performed and verified using the computer. This was followed by completion of the posterior and chamfer cuts. Femoral cutting guide for the central box was then positioned in the center box cut was performed.  Attention was then directed to the proximal tibia. Medial and lateral menisci were excised. The extramedullary tibial cutting guide was positioned using computer-assisted navigation so as to achieve a 0 varus-valgus alignment and 0 posterior slope. The  cut was performed and verified using the  computer. The proximal tibia was sized and it was felt that a size 5 tibial tray was appropriate. Tibial and femoral trials were inserted followed by insertion of a 10 mm polyethylene insert. The knee was felt to be tight medially. A Cobb elevator was used to elevate the superficial fibers of the medial collateral ligament. This allowed for excellent mediolateral soft tissue balancing both in flexion and in full extension. Finally, the patella was cut and prepared so as to accommodate a 41 mm 3 peg oval dome patella. A patella trial was placed and the knee was placed through a range of motion with excellent patellar tracking appreciated. The femoral trial was removed after debridement of posterior osteophytes. The central post-hole for the tibial component was reamed followed by insertion of a keel punch. Tibial trials were then removed. Cut surfaces of bone were irrigated with copious amounts of normal saline with antibiotic solution using pulsatile lavage and then suctioned dry. Polymethylmethacrylate cement was prepared in the usual fashion using a vacuum mixer. Cement was applied to the cut surface of the proximal tibia as well as along the undersurface of a size 5 MBT tibial component. Tibial component was positioned and impacted into place. Excess cement was removed using [employer] fast streamer. Cement was then applied to the cut surfaces of the femur as well as along the posterior flanges of the size 4 femoral component. The femoral component was positioned and impacted into place. Excess cement was removed using [employer] fast streamer. A 10 mm polyethylene trial was inserted and the knee was brought into full extension with steady axial compression applied. Finally, cement was applied to the backside of a 41 mm 3 peg oval dome patella and the patellar component was positioned and patellar clamp applied. Excess cement was removed using [employer] fast streamer. After adequate curing of the  cement, the tourniquet was deflated after a total tourniquet time of 92 minutes. Hemostasis was achieved using electrocautery. The knee was irrigated with copious amounts of normal saline with antibiotic solution using pulsatile lavage and then suctioned dry. 20 mL of 1.3% Exparel and 60 mL of 0.25% Marcaine in 40 mL of normal saline was injected along the posterior capsule, medial and lateral gutters, and along the arthrotomy site. A 10 mm stabilized rotating platform polyethylene insert was inserted and the knee was placed through a range of motion with excellent mediolateral soft tissue balancing appreciated and excellent patellar tracking noted. 2 medium drains were placed in the wound bed and brought out through separate stab incisions. The medial parapatellar portion of the incision was reapproximated using interrupted sutures of #1 Vicryl. Subcutaneous tissue was approximated in layers using first #0 Vicryl followed #2-0 Vicryl. The skin was approximated with skin staples. A sterile dressing was applied.  The patient tolerated the procedure well and was transported to the recovery room in stable condition.    James P. Holley Bouche., M.D.

## 2018-05-17 NOTE — H&P (Signed)
The patient has been re-examined, and the chart reviewed, and there have been no interval changes to the documented history and physical.    The risks, benefits, and alternatives have been discussed at length. The patient expressed understanding of the risks benefits and agreed with plans for surgical intervention.  Keelan Tripodi P. Nylani Michetti, Jr. M.D.    

## 2018-05-17 NOTE — Anesthesia Post-op Follow-up Note (Signed)
Anesthesia QCDR form completed.        

## 2018-05-17 NOTE — Progress Notes (Signed)
Pt A&OX4. Pt in no acute distress. VSS. No adventitious heart or lung sounds. Decreased sensation to both lower extremities due to spinal. Surgical site intact. Polar care and bone foam applied. Will continue to monitor.

## 2018-05-18 ENCOUNTER — Encounter: Payer: Self-pay | Admitting: Orthopedic Surgery

## 2018-05-18 LAB — BASIC METABOLIC PANEL
ANION GAP: 4 — AB (ref 5–15)
BUN: 18 mg/dL (ref 8–23)
CO2: 26 mmol/L (ref 22–32)
Calcium: 8.9 mg/dL (ref 8.9–10.3)
Chloride: 110 mmol/L (ref 98–111)
Creatinine, Ser: 0.76 mg/dL (ref 0.61–1.24)
GFR calc non Af Amer: >60 mL/min (ref >60)
Glucose, Bld: 112 mg/dL — ABNORMAL HIGH (ref 70–99)
POTASSIUM: 4.1 mmol/L (ref 3.5–5.1)
Sodium: 140 mmol/L (ref 135–145)

## 2018-05-18 LAB — CBC
HEMATOCRIT: 33.2 % — AB (ref 39.0–52.0)
HEMOGLOBIN: 10.9 g/dL — AB (ref 13.0–17.0)
MCH: 29.9 pg (ref 26.0–34.0)
MCHC: 32.8 g/dL (ref 30.0–36.0)
MCV: 91.2 fL (ref 80.0–100.0)
Platelets: 205 10*3/uL (ref 150–400)
RBC: 3.64 MIL/uL — AB (ref 4.22–5.81)
RDW: 13 % (ref 11.5–15.5)
WBC: 14.3 10*3/uL — AB (ref 4.0–10.5)
nRBC: 0 % (ref 0.0–0.2)

## 2018-05-18 MED ORDER — ENOXAPARIN SODIUM 40 MG/0.4ML ~~LOC~~ SOLN: 40.0000 mg | SUBCUTANEOUS | 0 refills | Status: DC

## 2018-05-18 MED ORDER — TRAMADOL HCL 50 MG PO TABS: 50.0000 mg | ORAL_TABLET | ORAL | 0 refills | Status: DC | PRN

## 2018-05-18 MED ORDER — OXYCODONE HCL 5 MG PO TABS: 5.0000 mg | ORAL_TABLET | ORAL | 0 refills | Status: DC | PRN

## 2018-05-18 NOTE — Discharge Summary (Signed)
Physician Discharge Summary  Patient ID: Wesley [name] MRN: [id number] DOB/AGE: 1940-07-25 77 y.o.  Admit date: 05/17/2018 Discharge date: 05/19/2018  Admission Diagnoses:  PRIMARY OSTEOARTHRITIS OF RIGHT KNEE   Discharge Diagnoses: Patient Active Problem List   Diagnosis Date Noted  . Ganglion and cyst of synovium, tendon and bursa 05/17/2018  . CAD (coronary artery disease) 05/17/2018  . Carpal tunnel syndrome 05/17/2018  . Chronic back pain 05/17/2018  . History of stroke 05/17/2018  . Synovitis and tenosynovitis, unspecified 05/17/2018  . Vitamin D deficiency 05/17/2018  . 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 Lymphocytosis 02/21/2016  . Malignant neoplasm of prostate ([place]) 11/20/2015  . TIA 03/27/2010  . HYPERLIPIDEMIA 10/04/2009  . HYPERTENSION 10/04/2009  . 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 GERD 10/04/2009  . PYELONEPHRITIS 10/04/2009  . PSA, INCREASED 10/04/2009  . 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 HEMORRHOIDECTOMY, HX OF 10/04/2009    Past Medical History:  Diagnosis Date  . Allergic rhinitis   . Cancer of jaw ([place]) [id number]  . Chronic back pain   . Coronary atherosclerosis of autologous vein bypass graft   . Essential hypertension, benign    pt denies 04/07/14  . GERD (gastroesophageal reflux disease)   . Hyperlipidemia   . Intermittent vertigo   . Labral tear of shoulder    w/ fracture  . Macular degeneration   . OSA (obstructive sleep apnea)    sleep study 06/03/2012 - AHI 54.74/hr during total sleep, AHI 49.66/hr during REM  . Prostate cancer ([place])   . S/P CABG x 4 2005  . Stroke ([place])   . Synovitis and tenosynovitis, unspecified   . Tubular adenoma of colon   . Unspecified vitamin D deficiency      Transfusion: No transfusions during this admission   Consultants (if any):   Discharged Condition: Improved  Hospital Course: Wesley [name] is an 77 y.o. male who was admitted 05/17/2018 with a diagnosis of  degenerative arthrosis of right knee and went to the operating room on 05/17/2018 and underwent the above named procedures.    Surgeries:Procedure(s): COMPUTER ASSISTED TOTAL KNEE ARTHROPLASTY on 05/17/2018  PRE-OPERATIVE DIAGNOSIS: Degenerative arthrosis of the right knee, primary  POST-OPERATIVE DIAGNOSIS:  Same  PROCEDURE:  Right total knee arthroplasty using computer-assisted navigation  SURGEON:  Marciano Sequin. M.D.  ASSISTANT:  Vance Peper, PA (present and scrubbed throughout the case, critical for assistance with exposure, retraction, instrumentation, and closure)  ANESTHESIA: spinal  ESTIMATED BLOOD LOSS: 50 mL  FLUIDS REPLACED: 1200 mL of crystalloid  TOURNIQUET TIME: 92 minutes  DRAINS: 2 medium Hemovac drains  SOFT TISSUE RELEASES: Anterior cruciate ligament, posterior cruciate ligament, deep and superficial medial collateral ligament, patellofemoral ligament  IMPLANTS UTILIZED: DePuy PFC Sigma size 4 posterior stabilized femoral component (cemented), size 5 MBT tibial component (cemented), 41 mm 3 peg oval dome patella (cemented), and a 10 mm stabilized rotating platform polyethylene insert.  INDICATIONS FOR SURGERY: Wesley [name] is a 77 y.o. year old male with a long history of progressive knee pain. X-rays demonstrated severe degenerative changes in tricompartmental fashion. The patient had not seen any significant improvement despite conservative nonsurgical intervention. After discussion of the risks and benefits of surgical intervention, the patient expressed understanding of the risks benefits and agree with plans for total knee arthroplasty.   The risks, benefits, and alternatives were discussed at length including but not limited to the risks of infection, bleeding, nerve injury, stiffness, blood clots, the  need for revision surgery, cardiopulmonary complications, among others, and they were willing to proceed. Patient tolerated the surgery  well. No complications .Patient was taken to PACU where she was stabilized and then transferred to the orthopedic floor.  Patient started on Lovenox 30 mg q 12 hrs. Foot pumps applied bilaterally at 80 mm hgb. Heels elevated off bed with rolled towels. No evidence of DVT. Calves non tender. Negative Homan. Physical therapy started on day #1 for gait training and transfer with OT starting on  day #1 for ADL and assisted devices. Patient has done well with therapy. Ambulated greater than 200 feet upon being discharged.  Was able to ascend and descend 4 steps safely and independently  Patient's IV And Foley were discontinued on day #1 with Hemovac being discontinued on day #2. Dressing was changed on day 2 prior to patient being discharged   He was given perioperative antibiotics:  Anti-infectives (From admission, onward)   Start     Dose/Rate Route Frequency Ordered Stop   05/17/18 1400  clindamycin (CLEOCIN) IVPB 600 mg     600 mg 100 mL/hr over 30 Minutes Intravenous Every 6 hours 05/17/18 1315 05/18/18 1359   05/17/18 0621  clindamycin (CLEOCIN) 900 MG/50ML IVPB    Note to Pharmacy:  Norton Blizzard  : cabinet override      05/17/18 0621 05/17/18 0750   05/17/18 0600  clindamycin (CLEOCIN) IVPB 900 mg     900 mg 100 mL/hr over 30 Minutes Intravenous On call to O.R. 05/16/18 2314 05/17/18 0805    .  He was fitted with AV 1 compression foot pump devices, instructed on heel pumps, early ambulation, and fitted with TED stockings bilaterally for DVT prophylaxis.  He benefited maximally from the hospital stay and there were no complications.    Recent vital signs:  Vitals:   05/18/18 0001 05/18/18 0526  BP: (!) 118/53 114/67  Pulse: 70 68  Resp: 18 18  Temp: (!) 97.5 F (36.4 C) (!) 97.5 F (36.4 C)  SpO2: 96% 97%    Recent laboratory studies:  Lab Results  Component Value Date   HGB 10.9 (L) 05/18/2018   HGB 13.7 05/05/2018   HGB 13.6 08/04/2017   Lab Results  Component  Value Date   WBC 14.3 (H) 05/18/2018   PLT 205 05/18/2018   Lab Results  Component Value Date   INR 0.95 05/05/2018   Lab Results  Component Value Date   NA 140 05/18/2018   K 4.1 05/18/2018   CL 110 05/18/2018   CO2 26 05/18/2018   BUN 18 05/18/2018   CREATININE 0.76 05/18/2018   GLUCOSE 112 (H) 05/18/2018    Discharge Medications:   Allergies as of 05/19/2018      Reactions   Amoxicillin Nausea Only      Medication List    STOP taking these medications   aspirin EC 81 MG tablet   aspirin-acetaminophen-caffeine 250-250-65 MG tablet Commonly known as:  EXCEDRIN MIGRAINE   ibuprofen 200 MG tablet Commonly known as:  ADVIL,MOTRIN   multivitamin tablet     TAKE these medications   atorvastatin 80 MG tablet Commonly known as:  LIPITOR Take 80 mg by mouth daily.   azelastine 0.1 % nasal spray Commonly known as:  ASTELIN Place 1 spray into the nose 2 (two) times daily as needed for rhinitis.   CALCIUM 1200 PO Take 1,200 mg by mouth daily.   calcium elemental as carbonate 400 MG chewable tablet Commonly known as:  BARIATRIC TUMS ULTRA Chew 1 tablet by mouth daily.   Coenzyme Q10 400 MG Caps Take 100 mg by mouth daily.   CRANBERRY PO Take 42 mg by mouth daily.   diphenhydrAMINE 25 MG tablet Commonly known as:  BENADRYL Take 25 mg by mouth at bedtime as needed.   Echinacea 450 MG Caps Take 450 mg by mouth daily as needed (cold symptoms).   enoxaparin 40 MG/0.4ML injection Commonly known as:  LOVENOX Inject 0.4 mLs (40 mg total) into the skin daily for 14 days. Start taking on:  05/20/2018   FISH OIL PO Take 400 mg by mouth daily. Fish, flax, borage oil with omega   Ginkgo 60 MG Tabs Take 60 mg by mouth daily.   GLUCOSAMINE-CHONDROITIN PO Take 1 tablet by mouth daily. 1500 mg/ msm 1500mg    ICAPS AREDS 2 PO Take 1 tablet by mouth daily.   CENTRUM SILVER PO Take 1 tablet by mouth daily.   L-Carnitine 500 MG Caps Take 500 mg by mouth  daily.   LUMIGAN 0.01 % Soln Generic drug:  bimatoprost Place 1 drop into both eyes at bedtime.   MAGNESIUM OXIDE PO Take 500 mg by mouth daily.   meloxicam 15 MG tablet Commonly known as:  MOBIC Take 15 mg by mouth daily.   oxyCODONE 5 MG immediate release tablet Commonly known as:  Oxy IR/ROXICODONE Take 1 tablet (5 mg total) by mouth every 4 (four) hours as needed for moderate pain (pain score 4-6).   pantoprazole 40 MG tablet Commonly known as:  PROTONIX Take 40 mg by mouth daily.   PROBIOTIC ACIDOPHILUS PO Take 1,000,000 Units by mouth daily.   Selenium 200 MCG Caps Take 200 mcg by mouth daily.   tamsulosin 0.4 MG Caps capsule Commonly known as:  FLOMAX Take 0.4 mg by mouth at bedtime.   traMADol 50 MG tablet Commonly known as:  ULTRAM TAKE 1 TABLET BY MOUTH EVERY 4 TO 6 HOURS AS NEEDED What changed:  See the new instructions.   traMADol 50 MG tablet Commonly known as:  ULTRAM Take 1-2 tablets (50-100 mg total) by mouth every 4 (four) hours as needed for moderate pain. What changed:  You were already taking a medication with the same name, and this prescription was added. Make sure you understand how and when to take each.   vitamin C 1000 MG tablet Take 2,000 mg by mouth daily.   zinc gluconate 50 MG tablet Take 50 mg by mouth daily.            Durable Medical Equipment  (From admission, onward)         Start     Ordered   05/17/18 1316  DME Walker rolling  Once    Question:  Patient needs a walker to treat with the following condition  Answer:  Total knee replacement status   05/17/18 1315   05/17/18 1316  DME Bedside commode  Once    Question:  Patient needs a bedside commode to treat with the following condition  Answer:  Total knee replacement status   05/17/18 1315          Diagnostic Studies: Dg Knee Right Port  Result Date: 05/17/2018 CLINICAL DATA:  Postop right knee replacement EXAM: PORTABLE RIGHT KNEE - 1-2 VIEW COMPARISON:   None. FINDINGS: Changes of right knee replacement. No hardware bony complicating feature. Soft tissue drains in place. IMPRESSION: Right knee replacement.  No complicating feature. Electronically Signed   By: Lennette Bihari  [place] M.D.   On: 05/17/2018 11:11    Disposition:   Discharge Instructions    Increase activity slowly   Complete by:  As directed       Follow-up Information    Watt Climes, PA On 06/01/2018.   Specialty:  Physician Assistant Why:  at 1:15pm Contact information: [place] [address] [phone]        Dereck Leep, MD On 07/06/2018.   Specialty:  Orthopedic Surgery Why:  at 3:15pm Contact information: [place] [address] [phone]            Signed: Watt Climes 05/18/2018, 7:42 AM

## 2018-05-18 NOTE — Care Management Note (Addendum)
Case Management Note  Patient Details  Name: Wesley [name] MRN: [id number] Date of Birth: 28-May-1941  Subjective/Objective:                   RNCM met with patient to discuss discharge planning He states he plans to return to home with friend Wesley [name] at discharge. He specifically requests [name] with [facility].  He states he has a front-wheeled walker and will obtain a cane on his own.  He uses [employer] [phone].   Action/Plan: Referral to [facility] with [facility]. Lovenox 6m injection daily for 14 days no-ref[facility]. RNCM will follow. Update: 42.11$ for Lovenox. Patient updated and agrees.   Expected Discharge Date:                  Expected Discharge Plan:     In-House Referral:     Discharge planning Services  CM Consult  Post Acute Care Choice:  Home Health Choice offered to:  Patient  DME Arranged:    DME Agency:     [place] Status of Service:  In process, will continue to follow  If discussed at Long Length of Stay Meetings, dates discussed:    Additional Comments:  Wesley Garfinkel RN 05/18/2018, 8:29 AM

## 2018-05-18 NOTE — Progress Notes (Signed)
Clinical Social Worker (CSW) received SNF consult. PT is recommending home health. RN case manager aware of above. Please reconsult if future social work needs arise. CSW signing off.   Avilyn Virtue, LCSW [phone] 

## 2018-05-18 NOTE — Progress Notes (Addendum)
Physical Therapy Treatment Patient Details Name: Wesley [name] MRN: [id number] DOB: Oct 27, 1940 Today's Date: 05/18/2018    History of Present Illness Pt is a 77 y/o M s/p R TKA.  Pt's PMH includes stroke, CABG, macular degeneration, intermittent vertigo, chronic back pain, prostate cancer.    PT Comments    Pt is s/p R TKA resulting in the deficits listed below (see PT Problem List).  Wesley [name] did very well with therapy this date.  He demonstrated ability to ambulate around nurse's station with RW and cues for proper RW management.  He completed therapeutic exercises and was able to perform SLR x10 without assist.  Pt will benefit from skilled PT to increase their independence and safety with mobility to allow discharge to the venue listed below.     Follow Up Recommendations  Home health PT     Equipment Recommendations  None recommended by PT    Recommendations for Other Services OT consult     Precautions / Restrictions Precautions Precautions: Fall;Knee Precaution Booklet Issued: Yes (comment) Precaution Comments: Instructed pt in no pillow under knee and provided pt with exercise handout Required Braces or Orthoses: Knee Immobilizer - Right Knee Immobilizer - Right: ("if unable to perform SLR") Restrictions Weight Bearing Restrictions: Yes RLE Weight Bearing: Weight bearing as tolerated    Mobility  Bed Mobility Overal bed mobility: Needs Assistance Bed Mobility: Supine to Sit     Supine to sit: Supervision;HOB elevated     General bed mobility comments: Supervision for safety.  No physical assist or cues needed.  Transfers Overall transfer level: Needs assistance Equipment used: Rolling walker (2 wheeled) Transfers: Sit to/from Stand Sit to Stand: Min guard         General transfer comment: Cues for proper hand placement and safe technique.    Ambulation/Gait Ambulation/Gait assistance: Min guard Gait Distance (Feet): 200 Feet Assistive  device: Rolling walker (2 wheeled) Gait Pattern/deviations: Step-to pattern;Step-through pattern;Decreased step length - left;Decreased weight shift to right;Trunk flexed Gait velocity: decreased   General Gait Details: Cues for upright posture and for proper RW management as pt has tendency to push RW too far ahead.  Pt remains steady using RW.    Stairs             Wheelchair Mobility    Modified Rankin (Stroke Patients Only)       Balance Overall balance assessment: Needs assistance;History of Falls Sitting-balance support: No upper extremity supported;Feet supported Sitting balance-Leahy Scale: Good     Standing balance support: Single extremity supported;During functional activity Standing balance-Leahy Scale: Poor Standing balance comment: Relies on at least 1UE support for static and dynamic activities                            Cognition Arousal/Alertness: Awake/alert Behavior During Therapy: WFL for tasks assessed/performed Overall Cognitive Status: Within Functional Limits for tasks assessed                                        Exercises Total Joint Exercises Ankle Circles/Pumps: AROM;Both;10 reps;Supine Quad Sets: Strengthening;Both;15 reps;Supine Straight Leg Raises: Strengthening;Right;10 reps;Supine Knee Flexion: AAROM;Right;5 reps;Seated;Other (comment)(with 5 second holds) Goniometric ROM: -8 to 87 deg Marching in Standing: Both;10 reps;Standing    General Comments General comments (skin integrity, edema, etc.): BP taken in supine at start of session: 115/66.  Taken  again in sitting EOB: 131/74.  Pt denies dizziness or lightheadedness throughout session.        Pertinent Vitals/Pain Pain Assessment: Faces Faces Pain Scale: Hurts a little bit Pain Location: R knee Pain Descriptors / Indicators: Discomfort Pain Intervention(s): Limited activity within patient's tolerance;Monitored during session;Repositioned;Ice  applied    Home Living Family/patient expects to be discharged to:: Private residence Living Arrangements: Alone Available Help at Discharge: Friend(s);Available PRN/intermittently(fiance) Type of Home: House Home Access: Stairs to enter Entrance Stairs-Rails: Left;Right Home Layout: Multi-level;Able to live on main level with bedroom/bathroom Home Equipment: Gilford Rile - 2 wheels;Crutches;Wheelchair - manual;Shower seat - built in;Hand held shower head      Prior Function Level of Independence: Independent      Comments: Pt ambulates without AD.  3 falls in the past 6 months while working in the yard/woods.  Ind with ADLs and driving.  Works full time as an Forensic psychologist.     PT Goals (current goals can now be found in the care plan section) Acute Rehab PT Goals Patient Stated Goal: to return home at d/c and get back to working in yard PT Goal Formulation: With patient Time For Goal Achievement: 06/01/18 Potential to Achieve Goals: Good    Frequency    BID      PT Plan      Co-evaluation              AM-PAC PT "6 Clicks" Mobility   Outcome Measure  Help needed turning from your back to your side while in a flat bed without using bedrails?: A Little Help needed moving from lying on your back to sitting on the side of a flat bed without using bedrails?: A Little Help needed moving to and from a bed to a chair (including a wheelchair)?: A Little Help needed standing up from a chair using your arms (e.g., wheelchair or bedside chair)?: A Little Help needed to walk in hospital room?: A Little Help needed climbing 3-5 steps with a railing? : A Little 6 Click Score: 18    End of Session Equipment Utilized During Treatment: Gait belt Activity Tolerance: Patient tolerated treatment well Patient left: in chair;with call bell/phone within reach;with chair alarm set;Other (comment)(with bone foam and polar care in place) Nurse Communication: Mobility status;Other (comment)(IV  line leaking) PT Visit Diagnosis: Pain;Unsteadiness on feet (R26.81);Other abnormalities of gait and mobility (R26.89);Muscle weakness (generalized) (M62.81) Pain - Right/Left: Right Pain - part of body: Knee     Time: 9417-4081 PT Time Calculation (min) (ACUTE ONLY): 40 min  Charges:  $Gait Training: 8-22 mins $Therapeutic Exercise: 8-22 mins                      Collie Siad PT, DPT 05/18/2018, 11:33 AM

## 2018-05-18 NOTE — Evaluation (Signed)
Occupational Therapy Evaluation Patient Details Name: Wesley [name] MRN: [id number] DOB: October 06, 1940 Today's Date: 05/18/2018    History of Present Illness Pt is a 77 y/o M s/p R TKA.  Pt's PMH includes stroke, CABG, macular degeneration, intermittent vertigo, chronic back pain, prostate cancer.   Clinical Impression   Pt seen for OT evaluation this date, POD#1 from above surgery. Pt was independent in all ADLs and mobility prior to surgery.  Pt is eager to return to PLOF with less pain and improved safety and independence. Pt currently requires minimal assist for LB dressing and bathing while in seated position due to pain and limited AROM of R knee. Pt instructed in polar care mgt, falls prevention strategies, home/routines modifications, DME/AE for LB bathing and dressing tasks, and compression stocking mgt. Pt verbalized understanding of all education presented at this time. No further OT services are required at this time. Will sign off. Do not currently anticipate any OT needs following this hospitalization.      Follow Up Recommendations  No OT follow up    Equipment Recommendations  (reacher, sock aid)    Recommendations for Other Services       Precautions / Restrictions Precautions Precautions: Fall;Knee Required Braces or Orthoses: Knee Immobilizer - Right Knee Immobilizer - Right: (if unable to perform SLR) Restrictions Weight Bearing Restrictions: Yes RLE Weight Bearing: Weight bearing as tolerated      Mobility Bed Mobility               General bed mobility comments: Deferred; prt presented in recliner  Transfers Overall transfer level: Needs assistance Equipment used: Rolling walker (2 wheeled) Transfers: Sit to/from Stand Sit to Stand: Min guard              Balance Overall balance assessment: Needs assistance Sitting-balance support: No upper extremity supported;Feet supported Sitting balance-Leahy Scale: Good     Standing balance  support: Single extremity supported;During functional activity Standing balance-Leahy Scale: Poor                             ADL either performed or assessed with clinical judgement   ADL Overall ADL's : Needs assistance/impaired Eating/Feeding: Independent;Sitting   Grooming: Independent;Sitting   Upper Body Bathing: Supervision/ safety;Sitting   Lower Body Bathing: Minimal assistance;Sitting/lateral leans   Upper Body Dressing : Supervision/safety;Sitting   Lower Body Dressing: Sit to/from stand;Minimal assistance;With adaptive equipment   Toilet Transfer: Actor                   Vision Baseline Vision/History: Wears glasses Wears Glasses: Reading only Patient Visual Report: No change from baseline       Perception     Praxis      Pertinent Vitals/Pain Pain Assessment: 0-10 Pain Score: 3  Pain Location: R knee Pain Intervention(s): Limited activity within patient's tolerance;Monitored during session;Repositioned;Ice applied     Hand Dominance     Extremity/Trunk Assessment Upper Extremity Assessment Upper Extremity Assessment: Overall WFL for tasks assessed(grossly 4+/5)   Lower Extremity Assessment Lower Extremity Assessment: RLE deficits/detail RLE Deficits / Details: post op expected deficiencies       Communication Communication Communication: No difficulties   Cognition Arousal/Alertness: Awake/alert Behavior During Therapy: WFL for tasks assessed/performed Overall Cognitive Status: Within Functional Limits for tasks assessed  General Comments  Sitting BP 111/63; Standing 121/66; sitting 110/70    Exercises Other Exercises Other Exercises: Pt educated in polar care and compression sock mgt including donning/doffing, wear schedule and positioning Other Exercises: Pt educated in use of AE for LB dressing    Shoulder Instructions      Home  Living Family/patient expects to be discharged to:: Private residence Living Arrangements: Alone Available Help at Discharge: Friend(s);Available PRN/intermittently Type of Home: House Home Access: Stairs to enter [employer] of Steps: 2 Entrance Stairs-Rails: Left;Right Home Layout: Multi-level;Able to live on main level with bedroom/bathroom     Bathroom Shower/Tub: Hospital doctor Toilet: Handicapped height     Home Equipment: Environmental consultant - 2 wheels;Crutches;Wheelchair - manual;Shower seat - built in;Hand held shower head          Prior Functioning/Environment Level of Independence: Independent        Comments: Pt ambulates without AD. Furniture walker in house few weeks prior to surgery; 3 falls 2/2 tripping over roots in woods while completing yardwork; Independent with ADL/IADL and driving;works f/t as attorney        OT Problem List: Decreased strength;Decreased range of motion;Decreased knowledge of use of DME or AE;Pain;Impaired balance (sitting and/or standing)      OT Treatment/Interventions:      OT Goals(Current goals can be found in the care plan section) Acute Rehab OT Goals Patient Stated Goal: back get to working in woods OT Goal Formulation: All assessment and education complete, DC therapy Time For Goal Achievement: 06/01/18 Potential to Achieve Goals: Good  OT Frequency:     Barriers to D/C:            Co-evaluation              AM-PAC OT "6 Clicks" Daily Activity     Outcome Measure Help from another person eating meals?: None Help from another person taking care of personal grooming?: None Help from another person toileting, which includes using toliet, bedpan, or urinal?: A Little Help from another person bathing (including washing, rinsing, drying)?: A Little Help from another person to put on and taking off regular upper body clothing?: None Help from another person to put on and taking off regular lower body  clothing?: A Little 6 Click Score: 21   End of Session Equipment Utilized During Treatment: Gait belt;Rolling walker  Activity Tolerance: Patient tolerated treatment well Patient left: in chair;with call bell/phone within reach;with chair alarm set;with SCD's reapplied  OT Visit Diagnosis: Pain;Other abnormalities of gait and mobility (R26.89) Pain - Right/Left: Right Pain - part of body: Knee                Time: 1110-1140 OT Time Calculation (min): 30 min Charges:     Jadene Pierini OTS  05/18/2018, 1:40 PM

## 2018-05-18 NOTE — Progress Notes (Signed)
Alert and oriented. Vital signs stable. Hemovac, polar care, in place. Pain controlled. Dressing dry and intact. No acute distress noted. Staff will continue to monitor

## 2018-05-18 NOTE — Progress Notes (Signed)
Physical Therapy Treatment Patient Details Name: Wesley [name] MRN: [id number] DOB: [birth year] Today's Date: 05/18/2018    History of Present Illness Pt is a 77 y/o M s/p R TKA.  Pt's PMH includes stroke, CABG, macular degeneration, intermittent vertigo, chronic back pain, prostate cancer.    PT Comments    Resting comfortable but was able to get up with supervision, stand with min guard and education for safety and hand placements, ambulate around unit with min guard then remained in recliner after session.  He did report some increased soreness initially but it was relieved with time/distance.  Tolerated well but declined further exercises at this time.  Will address stairs in am.   Follow Up Recommendations  Home health PT     Equipment Recommendations  None recommended by PT    Recommendations for Other Services       Precautions / Restrictions Precautions Precautions: Fall;Knee Required Braces or Orthoses: Knee Immobilizer - Right Knee Immobilizer - Right: (if unable to perform SLR) Restrictions Weight Bearing Restrictions: Yes RLE Weight Bearing: Weight bearing as tolerated    Mobility  Bed Mobility Overal bed mobility: Needs Assistance Bed Mobility: Supine to Sit     Supine to sit: Supervision;HOB elevated     General bed mobility comments: Deferred; prt presented in recliner  Transfers Overall transfer level: Needs assistance Equipment used: Rolling walker (2 wheeled) Transfers: Sit to/from Stand Sit to Stand: Min guard         General transfer comment: Cues for proper hand placement and safe technique.    Ambulation/Gait Ambulation/Gait assistance: Min guard Gait Distance (Feet): 200 Feet Assistive device: Rolling walker (2 wheeled) Gait Pattern/deviations: Step-to pattern;Step-through pattern;Decreased step length - left;Decreased weight shift to right;Trunk flexed Gait velocity: decreased       Stairs             Wheelchair  Mobility    Modified Rankin (Stroke Patients Only)       Balance Overall balance assessment: Needs assistance Sitting-balance support: No upper extremity supported;Feet supported Sitting balance-Leahy Scale: Good     Standing balance support: During functional activity;Bilateral upper extremity supported Standing balance-Leahy Scale: Fair                              Cognition Arousal/Alertness: Awake/alert Behavior During Therapy: WFL for tasks assessed/performed Overall Cognitive Status: Within Functional Limits for tasks assessed                                        Exercises Other Exercises Other Exercises: to bathroom but passed gas only and voided. Other Exercises: Pt educated in use of AE for LB dressing     General Comments General comments (skin integrity, edema, etc.): Sitting BP 111/63; Standing 121/66; sitting 110/70      Pertinent Vitals/Pain Pain Assessment: 0-10 Pain Score: 2  Pain Location: R knee Pain Descriptors / Indicators: Discomfort Pain Intervention(s): Limited activity within patient's tolerance;Monitored during session;Ice applied    Home Living Family/patient expects to be discharged to:: Private residence Living Arrangements: Alone Available Help at Discharge: Friend(s);Available PRN/intermittently Type of Home: House Home Access: Stairs to enter Entrance Stairs-Rails: Left;Right Home Layout: Multi-level;Able to live on main level with bedroom/bathroom Home Equipment: Gilford Rile - 2 wheels;Crutches;Wheelchair - manual;Shower seat - built in;Hand held shower head      Prior  Function Level of Independence: Independent      Comments: Pt ambulates without AD. Furniture walker in house few weeks prior to surgery; 3 falls 2/2 tripping over roots in woods while completing yardwork; Independent with ADL/IADL and driving;works f/t as attorney   PT Goals (current goals can now be found in the care plan section) Acute  Rehab PT Goals Patient Stated Goal: back get to working in woods Progress towards PT goals: Progressing toward goals    Frequency    BID      PT Plan      Co-evaluation              AM-PAC PT "6 Clicks" Mobility   Outcome Measure  Help needed turning from your back to your side while in a flat bed without using bedrails?: None Help needed moving from lying on your back to sitting on the side of a flat bed without using bedrails?: None Help needed moving to and from a bed to a chair (including a wheelchair)?: A Little Help needed standing up from a chair using your arms (e.g., wheelchair or bedside chair)?: A Little Help needed to walk in hospital room?: A Little Help needed climbing 3-5 steps with a railing? : A Little 6 Click Score: 20    End of Session Equipment Utilized During Treatment: Gait belt Activity Tolerance: Patient tolerated treatment well Patient left: in chair;with call bell/phone within reach;with chair alarm set;Other (comment)   Pain - Right/Left: Right Pain - part of body: Knee     Time: 1401-1415 PT Time Calculation (min) (ACUTE ONLY): 14 min  Charges:  $Gait Training: 8-22 mins                     Chesley Noon, PTA 05/18/18, 3:27 PM

## 2018-05-18 NOTE — Anesthesia Postprocedure Evaluation (Signed)
Anesthesia Post Note  Patient: Wesley [name]  Procedure(s) Performed: COMPUTER ASSISTED TOTAL KNEE ARTHROPLASTY (Right )  Patient location during evaluation: Nursing Unit Anesthesia Type: Spinal Level of consciousness: oriented and awake and alert Pain management: pain level controlled Vital Signs Assessment: post-procedure vital signs reviewed and stable Respiratory status: spontaneous breathing and respiratory function stable Cardiovascular status: blood pressure returned to baseline and stable Postop Assessment: no headache, no backache, no apparent nausea or vomiting and patient able to bend at knees Anesthetic complications: no     Last Vitals:  Vitals:   05/18/18 0001 05/18/18 0526  BP: (!) 118/53 114/67  Pulse: 70 68  Resp: 18 18  Temp: (!) 36.4 C (!) 36.4 C  SpO2: 96% 97%    Last Pain:  Vitals:   05/18/18 0526  TempSrc: Oral  PainSc:                  Hedda Slade

## 2018-05-18 NOTE — Progress Notes (Signed)
   Subjective: 1 Day Post-Op Procedure(s) (LRB): COMPUTER ASSISTED TOTAL KNEE ARTHROPLASTY (Right) Patient reports pain as 0 on 0-10 scale.  Patient states he was having some pain but took an oxycodone and no longer is having any pain at all. Patient is well, and has had no acute complaints or problems We will start therapy today.  Plan is to go Home after hospital stay. no nausea and no vomiting Patient denies any chest pains or shortness of breath. Objective: Vital signs in last 24 hours: Temp:  [97.2 F (36.2 C)-98 F (36.7 C)] 97.5 F (36.4 C) (11/26 0526) Pulse Rate:  [56-70] 68 (11/26 0526) Resp:  [12-21] 18 (11/26 0526) BP: (102-151)/(53-81) 114/67 (11/26 0526) SpO2:  [93 %-100 %] 97 % (11/26 0526) Heels are non tender and elevated off the bed using rolled towels as well as bone foam under operative heel  Intake/Output from previous day: 11/25 0701 - 11/26 0700 In: 1554.6 [I.V.:1304.6; IV Piggyback:250] Out: 2285 [Urine:1995; Drains:240; Blood:50] Intake/Output this shift: No intake/output data recorded.  Recent Labs    05/18/18 0517  HGB 10.9*   Recent Labs    05/18/18 0517  WBC 14.3*  RBC 3.64*  HCT 33.2*  PLT 205   Recent Labs    05/18/18 0517  NA 140  K 4.1  CL 110  CO2 26  BUN 18  CREATININE 0.76  GLUCOSE 112*  CALCIUM 8.9   No results for input(s): LABPT, INR in the last 72 hours.  EXAM General - Patient is Alert, Appropriate and Oriented Extremity - Neurologically intact Neurovascular intact Sensation intact distally Intact pulses distally Dorsiflexion/Plantar flexion intact Compartment soft Dressing - dressing C/D/I Motor Function - intact, moving foot and toes well on exam.  Able to do straight leg raise on his own.  Moving knee very well.  Past Medical History:  Diagnosis Date  . Allergic rhinitis   . 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 Chronic back pain   . Coronary atherosclerosis of autologous vein bypass graft   . Essential  hypertension, benign    pt denies 04/07/14  . GERD (gastroesophageal reflux disease)   . Hyperlipidemia   . Intermittent vertigo   . Labral tear of shoulder    w/ fracture  . Macular degeneration   . OSA (obstructive sleep apnea)    sleep study 06/03/2012 - AHI 54.74/hr during total sleep, AHI 49.66/hr during REM  . Prostate cancer ([place])   . S/P CABG x 4 2005  . Stroke ([place])   . Synovitis and tenosynovitis, unspecified   . Tubular adenoma of colon   . Unspecified vitamin D deficiency     Assessment/Plan: 1 Day Post-Op Procedure(s) (LRB): COMPUTER ASSISTED TOTAL KNEE ARTHROPLASTY (Right) Active Problems:   Total knee replacement status  Estimated body mass index is 30.38 kg/m as calculated from the following:   Height as of 05/05/18: 5\' 7"  (1.702 m).   Weight as of 05/05/18: 88 kg. Advance diet Up with therapy D/C IV fluids Plan for discharge tomorrow Discharge home with home health  Labs: None DVT Prophylaxis - Lovenox, Foot Pumps and TED hose Weight-Bearing as tolerated to right leg D/C O2 and Pulse OX and try on Room Air Begin working on bowel movement  Deshay Blumenfeld R. [place] 05/18/2018, 7:35 AM

## 2018-05-19 NOTE — Progress Notes (Signed)
   Subjective: 2 Days Post-Op Procedure(s) (LRB): COMPUTER ASSISTED TOTAL KNEE ARTHROPLASTY (Right) Patient reports pain as 0 on 0-10 scale.  Pain controlled with pain medicine Patient is well, and has had no acute complaints or problems Patient did very well with physical therapy yesterday.  Was able to ambulate the nurses station twice.  Has 87 degrees of flexion.  Still needs to do steps prior to discharge Plan is to go Home after hospital stay. no nausea and no vomiting Patient denies any chest pains or shortness of breath. Objective: Vital signs in last 24 hours: Temp:  [97.4 F (36.3 C)-98.2 F (36.8 C)] 98.2 F (36.8 C) (11/26 2254) Pulse Rate:  [69-80] 78 (11/26 2254) Resp:  [17-20] 17 (11/26 2254) BP: (99-134)/(57-77) 134/77 (11/26 2254) SpO2:  [95 %-97 %] 97 % (11/26 2254) well approximated incision Heels are non tender and elevated off the bed using rolled towels Intake/Output from previous day: 11/26 0701 - 11/27 0700 In: 340 [P.O.:240; IV Piggyback:100] Out: 300 [Drains:300] Intake/Output this shift: Total I/O In: -  Out: 110 [Drains:110]  Recent Labs    05/18/18 0517  HGB 10.9*   Recent Labs    05/18/18 0517  WBC 14.3*  RBC 3.64*  HCT 33.2*  PLT 205   Recent Labs    05/18/18 0517  NA 140  K 4.1  CL 110  CO2 26  BUN 18  CREATININE 0.76  GLUCOSE 112*  CALCIUM 8.9   No results for input(s): LABPT, INR in the last 72 hours.  EXAM General - Patient is Alert, Appropriate and Oriented Extremity - Neurologically intact Neurovascular intact Sensation intact distally Intact pulses distally Dorsiflexion/Plantar flexion intact No cellulitis present Compartment soft Dressing - dressing C/D/I Motor Function - intact, moving foot and toes well on exam.    Past Medical History:  Diagnosis Date  . Allergic rhinitis   . 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 Chronic back pain   . Coronary atherosclerosis of autologous vein bypass graft   . Essential  hypertension, benign    pt denies 04/07/14  . GERD (gastroesophageal reflux disease)   . Hyperlipidemia   . Intermittent vertigo   . Labral tear of shoulder    w/ fracture  . Macular degeneration   . OSA (obstructive sleep apnea)    sleep study 06/03/2012 - AHI 54.74/hr during total sleep, AHI 49.66/hr during REM  . Prostate cancer ([place])   . S/P CABG x 4 2005  . Stroke ([place])   . Synovitis and tenosynovitis, unspecified   . Tubular adenoma of colon   . Unspecified vitamin D deficiency     Assessment/Plan: 2 Days Post-Op Procedure(s) (LRB): COMPUTER ASSISTED TOTAL KNEE ARTHROPLASTY (Right) Active Problems:   Total knee replacement status  Estimated body mass index is 30.38 kg/m as calculated from the following:   Height as of 05/05/18: 5\' 7"  (1.702 m).   Weight as of 05/05/18: 88 kg. Up with therapy Discharge home with home health  Labs: None DVT Prophylaxis - Lovenox, Foot Pumps and TED hose Weight-Bearing as tolerated to right leg Hemovac was discontinued today.  Into the drain appeared to be intact. Patient may be discharged home once he does steps with physical therapy Please wash operative leg, change dressing and apply TED stockings to both legs prior to being discharged Please give the patient 2 extra honeycomb dressings to take home  Hato Candal. [place] 05/19/2018, 6:51 AM

## 2018-05-19 NOTE — Progress Notes (Signed)
Physical Therapy Treatment Patient Details Name: Wesley [name] MRN: [id number] DOB: [birth year] Today's Date: 05/19/2018    History of Present Illness Pt is a 77 y/o M s/p R TKA.  Pt's PMH includes stroke, CABG, macular degeneration, intermittent vertigo, chronic back pain, prostate cancer.   PT Comments    Wesley [name] made excellent progress with mobility.  He ambulated around nurses station again and completed stair training.  Follow up recommendations remain appropriate.  Follow Up Recommendations  Home health PT     Equipment Recommendations  None recommended by PT    Recommendations for Other Services       Precautions / Restrictions Precautions Precautions: Fall;Knee Required Braces or Orthoses: Knee Immobilizer - Right Knee Immobilizer - Right: ("if unable to perform SLR") Restrictions Weight Bearing Restrictions: Yes RLE Weight Bearing: Weight bearing as tolerated    Mobility  Bed Mobility Overal bed mobility: Needs Assistance Bed Mobility: Supine to Sit     Supine to sit: Supervision;HOB elevated     General bed mobility comments: Supervision for safety.  No physical assist or cues needed.  Transfers Overall transfer level: Needs assistance Equipment used: Rolling walker (2 wheeled) Transfers: Sit to/from Stand Sit to Stand: Min guard         General transfer comment: Mild unsteadiness when coming to stand but no LOB.  Min guard for safety.   Ambulation/Gait Ambulation/Gait assistance: Supervision Gait Distance (Feet): 220 Feet Assistive device: Rolling walker (2 wheeled) Gait Pattern/deviations: Step-through pattern;Decreased step length - left;Decreased weight shift to right;Trunk flexed Gait velocity: decreased   General Gait Details: Cues for upright posture and to point toes forward to avoid compensatory R hip ER to avoid knee flexion.     Stairs Stairs: Yes Stairs assistance: Min guard Stair Management: One rail Right;Step to  pattern;Sideways Number of Stairs: 4 General stair comments: Cues for proper technique.  Pt performs safely and with no signs of instability.  Min guard for safety.    Wheelchair Mobility    Modified Rankin (Stroke Patients Only)       Balance Overall balance assessment: Needs assistance;History of Falls Sitting-balance support: No upper extremity supported;Feet supported Sitting balance-Leahy Scale: Good     Standing balance support: Single extremity supported;During functional activity Standing balance-Leahy Scale: Fair Standing balance comment: Pt able to stand at sink and wash hands without UE support but uses RW to ambulate.                             Cognition Arousal/Alertness: Awake/alert Behavior During Therapy: WFL for tasks assessed/performed Overall Cognitive Status: Within Functional Limits for tasks assessed                                        Exercises Total Joint Exercises Ankle Circles/Pumps: AROM;Both;10 reps;Supine Heel Slides: AROM;Right;10 reps;Supine Straight Leg Raises: Strengthening;Right;10 reps;Supine Knee Flexion: AAROM;Right;Seated;10 reps Goniometric ROM: -6 to 90 deg    General Comments        Pertinent Vitals/Pain Pain Assessment: 0-10 Pain Score: 3  Pain Location: R knee Pain Descriptors / Indicators: Discomfort;Sore Pain Intervention(s): Limited activity within patient's tolerance;Monitored during session;Repositioned;Ice applied    Home Living                      Prior Function  PT Goals (current goals can now be found in the care plan section) Acute Rehab PT Goals Patient Stated Goal: to return home at d/c and get back to working in yard PT Goal Formulation: With patient Time For Goal Achievement: 06/01/18 Potential to Achieve Goals: Good Progress towards PT goals: Progressing toward goals    Frequency    BID      PT Plan Current plan remains appropriate     Co-evaluation              AM-PAC PT "6 Clicks" Mobility   Outcome Measure  Help needed turning from your back to your side while in a flat bed without using bedrails?: A Little Help needed moving from lying on your back to sitting on the side of a flat bed without using bedrails?: A Little Help needed moving to and from a bed to a chair (including a wheelchair)?: A Little Help needed standing up from a chair using your arms (e.g., wheelchair or bedside chair)?: A Little Help needed to walk in hospital room?: A Little Help needed climbing 3-5 steps with a railing? : A Little 6 Click Score: 18    End of Session Equipment Utilized During Treatment: Gait belt Activity Tolerance: Patient tolerated treatment well Patient left: in chair;with call bell/phone within reach;with chair alarm set;Other (comment)(with bone foam and polar care in place) Nurse Communication: Mobility status PT Visit Diagnosis: Pain;Unsteadiness on feet (R26.81);Other abnormalities of gait and mobility (R26.89);Muscle weakness (generalized) (M62.81) Pain - Right/Left: Right Pain - part of body: Knee     Time: 8003-4917 PT Time Calculation (min) (ACUTE ONLY): 27 min  Charges:  $Gait Training: 8-22 mins $Therapeutic Exercise: 8-22 mins                     Session was performed by student PT, Belva Crome, and directed, overseen, and documented by this PT.  Collie Siad PT, DPT 05/19/2018, 9:30 AM

## 2018-05-19 NOTE — Progress Notes (Signed)
Discharge summary reviewed with verbal understanding. Answered all questions.  

## 2018-05-19 NOTE — Care Management (Signed)
Brad with [facility] notified of patient discharge today.  No other RNCM needs.

## 2018-07-24 DIAGNOSIS — E042 Nontoxic multinodular goiter: Secondary | ICD-10-CM

## 2018-07-24 HISTORY — DX: Nontoxic multinodular goiter: E04.2

## 2018-08-05 ENCOUNTER — Other Ambulatory Visit
Admission: RE | Admit: 2018-08-05 | Discharge: 2018-08-05 | Disposition: A | Discharge status: Home / Self Care | Payer: Medicare Other | Source: Ambulatory Visit | Attending: Family Medicine | Admitting: Family Medicine

## 2018-08-05 DIAGNOSIS — R079 Chest pain, unspecified: Secondary | ICD-10-CM | POA: Insufficient documentation

## 2018-08-05 DIAGNOSIS — R0609 Other forms of dyspnea: Secondary | ICD-10-CM | POA: Insufficient documentation

## 2018-08-05 LAB — BRAIN NATRIURETIC PEPTIDE: B NATRIURETIC PEPTIDE 5: 812 pg/mL — AB (ref 0.0–100.0)

## 2018-08-05 LAB — FIBRIN DERIVATIVES D-DIMER (ARMC ONLY): FIBRIN DERIVATIVES D-DIMER (ARMC): 1376.45 ng{FEU}/mL — AB (ref 0.00–499.00)

## 2018-08-06 ENCOUNTER — Other Ambulatory Visit: Payer: Self-pay | Admitting: Family Medicine

## 2018-08-06 ENCOUNTER — Ambulatory Visit
Admission: RE | Admit: 2018-08-06 | Discharge: 2018-08-06 | Disposition: A | Discharge status: Home / Self Care | Payer: Medicare Other | Source: Ambulatory Visit | Attending: Family Medicine | Admitting: Family Medicine

## 2018-08-06 DIAGNOSIS — R079 Chest pain, unspecified: Secondary | ICD-10-CM

## 2018-08-06 DIAGNOSIS — R7989 Other specified abnormal findings of blood chemistry: Secondary | ICD-10-CM

## 2018-08-06 DIAGNOSIS — R0602 Shortness of breath: Secondary | ICD-10-CM

## 2018-08-06 MED ORDER — IOHEXOL 350 MG/ML SOLN
75.0000 mL | Freq: Once | INTRAVENOUS | Status: AC | PRN
  Administered 2018-08-06: 75 mL via INTRAVENOUS

## 2018-08-19 DIAGNOSIS — E042 Nontoxic multinodular goiter: Secondary | ICD-10-CM | POA: Insufficient documentation

## 2018-09-01 ENCOUNTER — Ambulatory Visit: Admission: RE | Admit: 2018-09-01 | Payer: Medicare Other | Source: Home / Self Care | Admitting: Internal Medicine

## 2018-09-01 ENCOUNTER — Encounter: Admission: RE | Payer: Self-pay | Source: Home / Self Care

## 2018-09-01 SURGERY — RIGHT/LEFT HEART CATH AND CORONARY/GRAFT ANGIOGRAPHY
Anesthesia: Moderate Sedation

## 2018-12-31 ENCOUNTER — Encounter
Admission: RE | Admit: 2018-12-31 | Discharge: 2018-12-31 | Disposition: A | Discharge status: Home / Self Care | Payer: Medicare Other | Source: Ambulatory Visit | Attending: Orthopedic Surgery | Admitting: Orthopedic Surgery

## 2018-12-31 ENCOUNTER — Other Ambulatory Visit: Payer: Self-pay

## 2018-12-31 DIAGNOSIS — Z01818 Encounter for other preprocedural examination: Secondary | ICD-10-CM | POA: Diagnosis present

## 2018-12-31 DIAGNOSIS — R9431 Abnormal electrocardiogram [ECG] [EKG]: Secondary | ICD-10-CM | POA: Insufficient documentation

## 2018-12-31 DIAGNOSIS — I10 Essential (primary) hypertension: Secondary | ICD-10-CM | POA: Diagnosis not present

## 2018-12-31 DIAGNOSIS — Z8673 Personal history of transient ischemic attack (TIA), and cerebral infarction without residual deficits: Secondary | ICD-10-CM | POA: Insufficient documentation

## 2018-12-31 DIAGNOSIS — Z1159 Encounter for screening for other viral diseases: Secondary | ICD-10-CM | POA: Diagnosis not present

## 2018-12-31 HISTORY — DX: Heart failure, unspecified: I50.9

## 2018-12-31 HISTORY — DX: Cardiac arrhythmia, unspecified: I49.9

## 2018-12-31 LAB — CBC
HCT: 38.9 % — ABNORMAL LOW (ref 39.0–52.0)
Hemoglobin: 12.5 g/dL — ABNORMAL LOW (ref 13.0–17.0)
MCH: 30.5 pg (ref 26.0–34.0)
MCHC: 32.1 g/dL (ref 30.0–36.0)
MCV: 94.9 fL (ref 80.0–100.0)
Platelets: 247 10*3/uL (ref 150–400)
RBC: 4.1 MIL/uL — ABNORMAL LOW (ref 4.22–5.81)
RDW: 13.2 % (ref 11.5–15.5)
WBC: 12.4 10*3/uL — ABNORMAL HIGH (ref 4.0–10.5)
nRBC: 0 % (ref 0.0–0.2)

## 2018-12-31 LAB — BASIC METABOLIC PANEL
Anion gap: 6 (ref 5–15)
BUN: 18 mg/dL (ref 8–23)
CO2: 26 mmol/L (ref 22–32)
Calcium: 9.9 mg/dL (ref 8.9–10.3)
Chloride: 109 mmol/L (ref 98–111)
Creatinine, Ser: 0.94 mg/dL (ref 0.61–1.24)
GFR calc Af Amer: >60 mL/min (ref >60)
GFR calc non Af Amer: >60 mL/min (ref >60)
Glucose, Bld: 109 mg/dL — ABNORMAL HIGH (ref 70–99)
Potassium: 4.8 mmol/L (ref 3.5–5.1)
Sodium: 141 mmol/L (ref 135–145)

## 2018-12-31 LAB — PROTIME-INR
INR: 0.9 (ref 0.8–1.2)
Prothrombin Time: 12.2 seconds (ref 11.4–15.2)

## 2018-12-31 NOTE — Patient Instructions (Signed)
INSTRUCTIONS FOR SURGERY     Your surgery is scheduled for:   Wednesday, July 15TH     To find out your arrival time for the day of surgery,          please call [phone] between 1 pm and 3 pm on : Tuesday, July 14TH     When you arrive for surgery, report to the [facility].       Do NOT stop on the first floor to register.    REMEMBER: Instructions that are not followed completely may result in serious medical risk,  up to and including death, or upon the discretion of your surgeon and anesthesiologist,            your surgery may need to be rescheduled.  __X__ 1. Do not eat food after midnight the night before your procedure.                    No gum, candy, lozenger, tic tacs, tums or hard candies.                  ABSOLUTELY NOTHING SOLID IN YOUR MOUTH AFTER MIDNIGHT                    You may drink unlimited clear liquids up to 2 hours before you are scheduled to arrive for surgery.                   Do not drink anything within those 2 hours unless you need to take medicine, then take the                   smallest amount you need.  Clear liquids include:  water, apple juice without pulp,                   any flavor Gatorade, Black coffee, black tea.  Sugar may be added but no dairy/ honey /lemon.                        Broth and jello is not considered a clear liquid.  __x__  2. On the morning of surgery, please brush your teeth with toothpaste and water. You may rinse with                  mouthwash if you wish but DO NOT SWALLOW TOOTHPASTE OR MOUTHWASH  __X___3. NO alcohol for 24 hours before or after surgery.  __x___ 4.  Do NOT smoke or use e-cigarettes for 24 HOURS PRIOR TO SURGERY.                      DO NOT use any chewable tobacco products for at least 6 hours prior to surgery.  __x___ 5. If you start any new medication after this appointment and prior to surgery, please    Bring it with you on the day of surgery.  ___x__ 6. Notify your doctor if there is any change in your medical condition, such as fever, infection, vomitting,  Diarrhea or any open sores.  __x___ 7.  USE the CHG SOAP as instructed, the night before surgery and the day of surgery.                   Once you have washed with this soap, do NOT use any of the following: Powders, perfumes                    or lotions. Please do not wear make up, hairpins, clips or nail polish. You may wear deodorant.                   Men may shave their face and neck.  Women need to shave 48 hours prior to surgery.                   DO NOT wear ANY jewelry on the day of surgery. If there are rings that are too tight to                    remove easily, please address this prior to the surgery day. Piercings need to be removed.                                                                     NO METAL ON YOUR BODY.                    Do NOT bring any valuables.  If you came to Pre-Admit testing then you will not need license,                     insurance card or credit card.  If you will be staying overnight, please either leave your things in                     the car or have your family be responsible for these items.                     [facility] IS NOT RESPONSIBLE FOR BELONGINGS OR VALUABLES.  ___X__ 8. DO NOT wear contact lenses on surgery day.  You may not have dentures,                     Hearing aides, contacts or glasses in the operating room. These items can be                    Placed in the Recovery Room to receive immediately after surgery.  __x___ 9. IF YOU ARE SCHEDULED TO GO HOME ON THE SAME DAY, YOU MUST                   Have someone to drive you home and to stay with you  for the first 24 hours.                    Have an arrangement prior to arriving on surgery day.  ___x__ 10. Take the following medications on the morning of surgery with a sip of water:  1. COREG                     2. PROTONIX  (TAKE NIGHT BEFORE AND MORNING OF SURGERY)                     3. MAGNESIUM                     4. TRAMADOL, IF NEED TO YOU CAN TAKE IT                     5.                     6.  __X___ 11.  Follow any instructions provided to you by your surgeon.                        Such as enema, clear liquid bowel prep. CARBOHYDRATE DRINK  __X__  12. STOP COUMADIN / PLAVIX / ELIQUIS / ASPIRIN AS OF:  TODAY                       THIS INCLUDES BC POWDERS / GOODIES POWDER  __x___ 13. STOP Anti-inflammatories as of: TODAY                      This includes IBUPROFEN / MOTRIN / ADVIL / ALEVE/ NAPROXYN/ [place] TO SURGERY.  __X___ 14.  Stop supplements until after surgery.                     This includes: ARREDS / OMEGA 3 / ZINC / COQ10 / CRANERRY / L-LYSINE / PROBIOTIC ZINC                 You may continue taking Vitamin B12 / Vitamin D3 / VITAMIN C  but do not take on the morning of surgery.  _____ 15. Bring your CPAP machine into preop with you on the morning of surgery.  ______16  Continue to take the following medications but do not take on the morning of surgery:                       VITAMIN C / LASIX / LOSARTAN / ALDACTONE / SELENIUM                        CONTINUE TAKING ALL NIGHT TIME MEDICATIONS AS USUAL.  ______17. If staying overnight, please have appropriate shoes to wear to be able to walk around the unit.                   Wear clean and comfortable clothing to the hospital.

## 2018-12-31 NOTE — Pre-Procedure Instructions (Signed)
Patient has expressed concern over general anesthesia leaving potential problems with memory for him.  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  He would like to have his procedure with spinal and maybe some light sedation.  Discussed this with Dr. Ronelle Nigh and he wanted patient to know that his recovery time at [facility] would be extended d/t spinal. Patient is okay with this process.  I told him that the anesthesiologist of that day will revisit this discussion on surgery day.

## 2019-01-01 LAB — SARS CORONAVIRUS 2 (TAT 6-24 HRS): SARS Coronavirus 2: NEGATIVE

## 2019-01-05 ENCOUNTER — Encounter
Admission: RE | Disposition: A | Discharge status: Home / Self Care | Payer: Self-pay | Source: Home / Self Care | Attending: Orthopedic Surgery

## 2019-01-05 ENCOUNTER — Encounter: Payer: Self-pay | Admitting: Orthopedic Surgery

## 2019-01-05 ENCOUNTER — Observation Stay
Admission: RE | Admit: 2019-01-05 | Discharge: 2019-01-06 | Disposition: A | Discharge status: Home / Self Care | Payer: Medicare Other | Attending: Orthopedic Surgery | Admitting: Orthopedic Surgery

## 2019-01-05 ENCOUNTER — Ambulatory Visit: Payer: Medicare Other | Admitting: Certified Registered Nurse Anesthetist

## 2019-01-05 ENCOUNTER — Other Ambulatory Visit: Payer: Self-pay

## 2019-01-05 DIAGNOSIS — M24661 Ankylosis, right knee: Principal | ICD-10-CM | POA: Insufficient documentation

## 2019-01-05 DIAGNOSIS — M238X1 Other internal derangements of right knee: Secondary | ICD-10-CM | POA: Insufficient documentation

## 2019-01-05 DIAGNOSIS — K219 Gastro-esophageal reflux disease without esophagitis: Secondary | ICD-10-CM | POA: Insufficient documentation

## 2019-01-05 DIAGNOSIS — G8929 Other chronic pain: Secondary | ICD-10-CM | POA: Diagnosis not present

## 2019-01-05 DIAGNOSIS — Z8546 Personal history of malignant neoplasm of prostate: Secondary | ICD-10-CM | POA: Insufficient documentation

## 2019-01-05 DIAGNOSIS — G4733 Obstructive sleep apnea (adult) (pediatric): Secondary | ICD-10-CM | POA: Insufficient documentation

## 2019-01-05 DIAGNOSIS — Z8673 Personal history of transient ischemic attack (TIA), and cerebral infarction without residual deficits: Secondary | ICD-10-CM | POA: Insufficient documentation

## 2019-01-05 DIAGNOSIS — I110 Hypertensive heart disease with heart failure: Secondary | ICD-10-CM | POA: Diagnosis not present

## 2019-01-05 DIAGNOSIS — Z79899 Other long term (current) drug therapy: Secondary | ICD-10-CM | POA: Insufficient documentation

## 2019-01-05 DIAGNOSIS — Z96651 Presence of right artificial knee joint: Secondary | ICD-10-CM | POA: Diagnosis not present

## 2019-01-05 DIAGNOSIS — Z9889 Other specified postprocedural states: Secondary | ICD-10-CM

## 2019-01-05 DIAGNOSIS — I509 Heart failure, unspecified: Secondary | ICD-10-CM | POA: Insufficient documentation

## 2019-01-05 DIAGNOSIS — I25810 Atherosclerosis of coronary artery bypass graft(s) without angina pectoris: Secondary | ICD-10-CM | POA: Insufficient documentation

## 2019-01-05 DIAGNOSIS — Z951 Presence of aortocoronary bypass graft: Secondary | ICD-10-CM | POA: Diagnosis not present

## 2019-01-05 DIAGNOSIS — E785 Hyperlipidemia, unspecified: Secondary | ICD-10-CM | POA: Diagnosis not present

## 2019-01-05 DIAGNOSIS — Z7982 Long term (current) use of aspirin: Secondary | ICD-10-CM | POA: Diagnosis not present

## 2019-01-05 HISTORY — PX: KNEE ARTHROSCOPY: SHX127

## 2019-01-05 SURGERY — ARTHROSCOPY, KNEE
Anesthesia: Spinal | Laterality: Right

## 2019-01-05 MED ORDER — HYDROCODONE-ACETAMINOPHEN 5-325 MG PO TABS: 1.0000 | ORAL_TABLET | ORAL | 0 refills | Status: DC | PRN

## 2019-01-05 MED ORDER — CEFAZOLIN SODIUM-DEXTROSE 2-4 GM/100ML-% IV SOLN
2.0000 g | INTRAVENOUS | Status: AC
  Administered 2019-01-05: 2 g via INTRAVENOUS

## 2019-01-05 MED ORDER — SODIUM CHLORIDE 0.9 % IV SOLN
INTRAVENOUS | Status: DC
  Administered 2019-01-05: 23:00:00 via INTRAVENOUS

## 2019-01-05 MED ORDER — FAMOTIDINE 20 MG PO TABS
ORAL_TABLET | ORAL | Status: AC
  Administered 2019-01-05: 20 mg via ORAL
  Filled 2019-01-05: qty 1

## 2019-01-05 MED ORDER — BUPIVACAINE-EPINEPHRINE 0.25% -1:200000 IJ SOLN
INTRAMUSCULAR | Status: DC | PRN
  Administered 2019-01-05: 5 mL
  Administered 2019-01-05: 25 mg

## 2019-01-05 MED ORDER — PROPOFOL 10 MG/ML IV BOLUS
INTRAVENOUS | Status: AC
  Filled 2019-01-05: qty 20

## 2019-01-05 MED ORDER — ACETAMINOPHEN 10 MG/ML IV SOLN
INTRAVENOUS | Status: AC
  Filled 2019-01-05: qty 100

## 2019-01-05 MED ORDER — METOCLOPRAMIDE HCL 5 MG/ML IJ SOLN: 5.0000 mg | Freq: Three times a day (TID) | INTRAMUSCULAR | Status: DC | PRN

## 2019-01-05 MED ORDER — FAMOTIDINE 20 MG PO TABS
20.0000 mg | ORAL_TABLET | Freq: Once | ORAL | Status: AC
  Administered 2019-01-05: 15:00:00 20 mg via ORAL

## 2019-01-05 MED ORDER — FENTANYL CITRATE (PF) 100 MCG/2ML IJ SOLN: 25.0000 ug | INTRAMUSCULAR | Status: DC | PRN

## 2019-01-05 MED ORDER — PROPOFOL 10 MG/ML IV BOLUS
INTRAVENOUS | Status: DC | PRN
  Administered 2019-01-05: 20 mg via INTRAVENOUS
  Administered 2019-01-05: 80 mg via INTRAVENOUS

## 2019-01-05 MED ORDER — CEFAZOLIN SODIUM-DEXTROSE 2-4 GM/100ML-% IV SOLN
INTRAVENOUS | Status: AC
  Filled 2019-01-05: qty 100

## 2019-01-05 MED ORDER — METOCLOPRAMIDE HCL 10 MG PO TABS: 5.0000 mg | ORAL_TABLET | Freq: Three times a day (TID) | ORAL | Status: DC | PRN

## 2019-01-05 MED ORDER — EPHEDRINE SULFATE 50 MG/ML IJ SOLN
INTRAMUSCULAR | Status: DC | PRN
  Administered 2019-01-05 (×7): 5 mg via INTRAVENOUS

## 2019-01-05 MED ORDER — CHLORHEXIDINE GLUCONATE 4 % EX LIQD: 60.0000 mL | Freq: Once | CUTANEOUS | Status: DC

## 2019-01-05 MED ORDER — DEXAMETHASONE SODIUM PHOSPHATE 10 MG/ML IJ SOLN
INTRAMUSCULAR | Status: AC
  Filled 2019-01-05: qty 1

## 2019-01-05 MED ORDER — CELECOXIB 200 MG PO CAPS
ORAL_CAPSULE | ORAL | Status: AC
  Administered 2019-01-05: 400 mg via ORAL
  Filled 2019-01-05: qty 2

## 2019-01-05 MED ORDER — OXYCODONE HCL 5 MG PO TABS: 5.0000 mg | ORAL_TABLET | Freq: Once | ORAL | Status: DC | PRN

## 2019-01-05 MED ORDER — PROPOFOL 500 MG/50ML IV EMUL
INTRAVENOUS | Status: DC | PRN
  Administered 2019-01-05: 30 ug/kg/min via INTRAVENOUS

## 2019-01-05 MED ORDER — OXYCODONE HCL 5 MG/5ML PO SOLN: 5.0000 mg | Freq: Once | ORAL | Status: DC | PRN

## 2019-01-05 MED ORDER — ACETAMINOPHEN 10 MG/ML IV SOLN
INTRAVENOUS | Status: DC | PRN
  Administered 2019-01-05: 1000 mg via INTRAVENOUS

## 2019-01-05 MED ORDER — LIDOCAINE HCL (PF) 2 % IJ SOLN
INTRAMUSCULAR | Status: AC
  Filled 2019-01-05: qty 10

## 2019-01-05 MED ORDER — BUPIVACAINE HCL (PF) 0.5 % IJ SOLN
INTRAMUSCULAR | Status: AC
  Filled 2019-01-05: qty 10

## 2019-01-05 MED ORDER — SODIUM CHLORIDE 0.9 % IV SOLN
INTRAVENOUS | Status: DC | PRN
  Administered 2019-01-05: 50 ug/min via INTRAVENOUS

## 2019-01-05 MED ORDER — MORPHINE SULFATE 4 MG/ML IJ SOLN
INTRAMUSCULAR | Status: DC | PRN
  Administered 2019-01-05: 4 mg via SUBCUTANEOUS

## 2019-01-05 MED ORDER — BUPIVACAINE-EPINEPHRINE (PF) 0.25% -1:200000 IJ SOLN
INTRAMUSCULAR | Status: AC
  Filled 2019-01-05: qty 30

## 2019-01-05 MED ORDER — DEXAMETHASONE SODIUM PHOSPHATE 10 MG/ML IJ SOLN
INTRAMUSCULAR | Status: DC | PRN
  Administered 2019-01-05: 10 mg via INTRAVENOUS

## 2019-01-05 MED ORDER — ENSURE PRE-SURGERY PO LIQD
296.0000 mL | Freq: Once | ORAL | Status: DC
  Filled 2019-01-05: qty 296

## 2019-01-05 MED ORDER — CELECOXIB 200 MG PO CAPS
400.0000 mg | ORAL_CAPSULE | Freq: Once | ORAL | Status: AC
  Administered 2019-01-05: 15:00:00 400 mg via ORAL

## 2019-01-05 MED ORDER — LACTATED RINGERS IV SOLN
INTRAVENOUS | Status: DC
  Administered 2019-01-05 (×2): via INTRAVENOUS

## 2019-01-05 MED ORDER — MORPHINE SULFATE (PF) 4 MG/ML IV SOLN
INTRAVENOUS | Status: AC
  Filled 2019-01-05: qty 1

## 2019-01-05 MED ORDER — ONDANSETRON HCL 4 MG PO TABS: 4.0000 mg | ORAL_TABLET | Freq: Four times a day (QID) | ORAL | Status: DC | PRN

## 2019-01-05 MED ORDER — BUPIVACAINE HCL (PF) 0.5 % IJ SOLN
INTRAMUSCULAR | Status: DC | PRN
  Administered 2019-01-05: 2.5 mL

## 2019-01-05 MED ORDER — ONDANSETRON HCL 4 MG/2ML IJ SOLN: 4.0000 mg | Freq: Four times a day (QID) | INTRAMUSCULAR | Status: DC | PRN

## 2019-01-05 SURGICAL SUPPLY — 25 items
BLADE SHAVER 4.5 DBL SERAT CV (CUTTER) IMPLANT
COVER WAND RF STERILE (DRAPES) ×3 IMPLANT
CUFF TOURN SGL QUICK 24 (TOURNIQUET CUFF) ×1
CUFF TOURN SGL QUICK 30 (TOURNIQUET CUFF)
CUFF TRNQT CYL 24X4X16.5-23 (TOURNIQUET CUFF) ×2 IMPLANT
CUFF TRNQT CYL 30X4X21-28X (TOURNIQUET CUFF) IMPLANT
DRSG DERMACEA 8X12 NADH (GAUZE/BANDAGES/DRESSINGS) ×3 IMPLANT
DURAPREP 26ML APPLICATOR (WOUND CARE) ×6 IMPLANT
GAUZE SPONGE 4X4 12PLY STRL (GAUZE/BANDAGES/DRESSINGS) ×3 IMPLANT
GLOVE BIOGEL M STRL SZ7.5 (GLOVE) ×3 IMPLANT
GLOVE INDICATOR 8.0 STRL GRN (GLOVE) ×3 IMPLANT
GOWN STRL REUS W/ TWL LRG LVL3 (GOWN DISPOSABLE) ×6 IMPLANT
GOWN STRL REUS W/TWL LRG LVL3 (GOWN DISPOSABLE) ×3
IV LACTATED RINGER IRRG 3000ML (IV SOLUTION) ×12
IV LR IRRIG 3000ML ARTHROMATIC (IV SOLUTION) ×24 IMPLANT
KIT TURNOVER KIT A (KITS) ×3 IMPLANT
MANIFOLD NEPTUNE II (INSTRUMENTS) ×3 IMPLANT
PACK ARTHROSCOPY KNEE (MISCELLANEOUS) ×3 IMPLANT
SET TUBE SUCT SHAVER OUTFL 24K (TUBING) ×3 IMPLANT
SET TUBE TIP INTRA-ARTICULAR (MISCELLANEOUS) ×3 IMPLANT
SUT ETHILON 3-0 FS-10 30 BLK (SUTURE) ×3
SUTURE EHLN 3-0 FS-10 30 BLK (SUTURE) ×2 IMPLANT
TUBING ARTHRO INFLOW-ONLY STRL (TUBING) ×3 IMPLANT
WAND HAND CNTRL MULTIVAC 50 (MISCELLANEOUS) ×9 IMPLANT
WRAP KNEE W/COLD PACKS 25.5X14 (SOFTGOODS) ×3 IMPLANT

## 2019-01-05 NOTE — Progress Notes (Signed)
ORTHOPAEDICS: Notified by PACU nurse that patient is still not able to void and is not stable with standing/walking secondary to residual from spinal.  Patient to be I&O catheterized. Will admit for overnight observation.  James P. Holley Bouche M.D.

## 2019-01-05 NOTE — Discharge Instructions (Signed)
°  Instructions after Knee Arthroscopy  ° ° Jeweline Reif P. Rebakah Cokley, Jr., M.D.    ° Dept. of Orthopaedics & Sports Medicine ° [facility] ° [address] ° [place], [place]  [address] ° ° Phone: [phone]   Fax: [phone] ° ° °DIET: °• Drink plenty of non-alcoholic fluids & begin a light diet. °• Resume your normal diet the day after surgery. ° °ACTIVITY:  °• You may use crutches or a walker with weight-bearing as tolerated, unless instructed otherwise. °• You may wean yourself off of the walker or crutches as tolerated.  °• Begin doing gentle exercises. Exercising will reduce the pain and swelling, increase motion, and prevent muscle weakness.   °• Avoid strenuous activities or athletics for a minimum of 4-6 weeks after arthroscopic surgery. °• Do not drive or operate any equipment until instructed. ° °WOUND CARE:  °• Place one to two pillows under the knee the first day or two when sitting or lying.  °• Continue to use the ice packs periodically to reduce pain and swelling. °• The small incisions in your knee are closed with nylon stitches. The stitches will be removed in the office. °• The bulky dressing may be removed on the second day after surgery. DO NOT TOUCH THE STITCHES. Put a Band-Aid over each stitch. Do NOT use any ointments or creams on the incisions.  °• You may bathe or shower after the stitches are removed at the first office visit following surgery. ° °MEDICATIONS: °• You may resume your regular medications. °• Please take the pain medication as prescribed. °• Do not take pain medication on an empty stomach. °• Do not drive or drink alcoholic beverages when taking pain medications. ° °CALL THE OFFICE FOR: °• Temperature above 101 degrees °• Excessive bleeding or drainage on the dressing. °• Excessive swelling, coldness, or paleness of the toes. °• Persistent nausea and vomiting. ° °FOLLOW-UP:  °• You should have an appointment to return to the office in 7-10 days after surgery.  °  °

## 2019-01-05 NOTE — H&P (Signed)
The patient has been re-examined, and the chart reviewed, and there have been no interval changes to the documented history and physical.    The risks, benefits, and alternatives have been discussed at length. The patient expressed understanding of the risks benefits and agreed with plans for surgical intervention.  James P. Hooten, Jr. M.D.    

## 2019-01-05 NOTE — Anesthesia Procedure Notes (Signed)
Spinal  Patient location during procedure: OR Start time: 01/05/2019 3:35 PM End time: 01/05/2019 3:45 PM Staffing Anesthesiologist: Piscitello, Precious Haws, MD Resident/CRNA: Caryl Asp, CRNA Performed: anesthesiologist and resident/CRNA  Preanesthetic Checklist Completed: patient identified, site marked, surgical consent, pre-op evaluation, timeout performed, IV checked, risks and benefits discussed and monitors and equipment checked Spinal Block Patient position: sitting Prep: Betadine Patient monitoring: heart rate, continuous pulse ox, blood pressure and cardiac monitor Approach: midline Location: L4-5 Injection technique: single-shot Needle Needle type: Whitacre and Introducer  Needle gauge: 24 G Needle length: 9 cm Additional Notes First attempts by CRNA, final attempts by MDA   Negative paresthesia. Negative blood return. Positive free-flowing CSF. Expiration date of kit checked and confirmed. Patient tolerated procedure well, without complications.

## 2019-01-05 NOTE — Anesthesia Post-op Follow-up Note (Signed)
Anesthesia QCDR form completed.        

## 2019-01-05 NOTE — Anesthesia Postprocedure Evaluation (Signed)
Anesthesia Post Note  Patient: Wesley [name]  Procedure(s) Performed: ARTHROSCOPY KNEE, SYNVECTOMY, LYSIS OF ADHESIONS  Patient location during evaluation: PACU Anesthesia Type: Spinal Level of consciousness: awake and alert and oriented Pain management: pain level controlled Vital Signs Assessment: post-procedure vital signs reviewed and stable Respiratory status: spontaneous breathing Cardiovascular status: blood pressure returned to baseline Postop Assessment: spinal receding Anesthetic complications: no     Last Vitals:  Vitals:   01/05/19 1900 01/05/19 1910  BP: 117/62 124/61  Pulse: (!) 57 (!) 57  Resp: 17 20  Temp:    SpO2: 97% 97%    Last Pain:  Vitals:   01/05/19 1910  TempSrc:   PainSc: 0-No pain                 Areesha Dehaven

## 2019-01-05 NOTE — Transfer of Care (Signed)
Immediate Anesthesia Transfer of Care Note  Patient: Wesley [name]  Procedure(s) Performed: CLOSED MANIPULATION KNEE, POSSIBLE ARTHRSCOPIC SYNVECTOMY/ LYSIS ADHESIONS (Right Knee) POSSIBLE ARTHROSCOPY KNEE (Right Knee)  Patient Location: PACU  Anesthesia Type:Spinal  Level of Consciousness: awake, alert  and oriented  Airway & Oxygen Therapy: Patient Spontanous Breathing and Patient connected to face mask oxygen  Post-op Assessment: Report given to RN and Post -op Vital signs reviewed and stable  Post vital signs: Reviewed and stable  Last Vitals:  Vitals Value Taken Time  BP 117/58 01/05/19 1818  Temp    Pulse 72 01/05/19 1822  Resp 15 01/05/19 1822  SpO2 100 % 01/05/19 1822  Vitals shown include unvalidated device data.  Last Pain:  Vitals:   01/05/19 1440  TempSrc: Temporal  PainSc: 0-No pain         Complications: No apparent anesthesia complications

## 2019-01-05 NOTE — Op Note (Signed)
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   ASSISTANT: None  ANESTHESIA: spinal  ESTIMATED BLOOD LOSS: Minimal  FLUIDS REPLACED: 1200 mL of crystalloid  TOURNIQUET TIME: Not used  DRAINS: None  INDICATIONS FOR SURGERY: Wesley [name] is a 78 y.o. yearear old male who previously underwent right total knee arthroplasty.  Despite aggressive physical therapy, he had restricted range of motion that had progressively worsened.  Initial plans were for manipulation of the knee under anesthesia, but the procedure was delayed due to the [place] pandemic.  We subsequently discussed manipulation of the knee under anesthesia with possible right knee arthroscopy and lysis of adhesions. After discussion of the risks and benefits of surgical intervention, the patient expressed understanding of the risks benefits and agree with plans for surgical intervention.Wesley [name]   PROCEDURE IN DETAIL: The patient was brought into the operating room and, after adequate spinal anesthesia, a "timeout" was performed as per usual protocol.  The right knee was flexed and approximately 85 degrees of flexion could be achieved before any attempted manipulation.  Next, I placed my name right shoulder on the patient's right upper tibia and placed my ear against the lateral aspect of the knee.  Gentle pressure was applied and there was only minimal audible lysis of adhesions.  Subsequent management following manipulation was only approximately 100 degrees of flexion.  It was thus elected to proceed with right knee arthroscopy and lysis of adhesions.  A tourniquet was  placed on the patient's right thigh and the leg was placed in leg holder.  All bony prominences were well-padded.  Patient's right knee and leg were cleaned and prepped with alcohol and DuraPrep and draped in usual sterile fashion.  The anticipated portal sites were injected with 0.25% Marcaine with epinephrine.  An anterolateral portal was created and cannula was inserted.  There was no effusion.  Scope was inserted and the knee was distended with fluid using the pump.  Next, an anteromedial portal was created and a second cannula was inserted.  The tip of the cannula could be visualized.  However, dense fibrotic tissue was encountered that was difficult to discern the total knee implants.  A 4.5 mm incisor shaver was inserted via the anteromedial portal and initial debridement of the fibrotic tissue and adhesions was performed.  This allowed for initial visualization of the medial femoral condyle.  Next, the 50 degree ArthroCare wand was used to continue with the debridement along the anterior aspect of the implants and extending to the medial gutter.  Continued systematic debridement of the scar tissue was performed along the medial gutter and up to the patella.  Additional debridement was performed in the area of the notch, removing large bolus of fibrotic tissue.  Next, the scope was reinserted via the anteromedial portal so as to better visualize the lateral aspect of the knee.  Again, dense fibrotic tissue was encountered basically causing adhesions along the entire lateral gutter.  Dissection was continued using combination of the 50 degree ArthroCare wand and a 4.5 mm incisor shaver until there was reestablishment of the lateral gutter.  The patella was easily visualized and noted be free of  any additional adhesions or scar tissue.  Good patellar tracking was noted.  The knee was irrigated with copious amounts of fluid and suctioned dry.  The anterolateral portal was reapproximated using #3-0 nylon.  A  combination of 0.25% Marcaine with epinephrine and 4 mg of morphine was injected via the scope.  Scope was removed and the anteromedial portal was reapproximated using #3-0 nylon.  A sterile dressing was applied.  Flexion of greater than 110 degrees was achieved even with the dressing in place.  The patient tolerated procedure well.  He was transported to the recovery room in stable condition.   Wesley [name] Wesley [name] M.D.

## 2019-01-05 NOTE — Anesthesia Preprocedure Evaluation (Addendum)
Anesthesia Evaluation  Patient identified by MRN, date of birth, ID band Patient awake    Reviewed: Allergy & Precautions, H&P , NPO status , Patient's Chart, lab work & pertinent test results  History of Anesthesia Complications Negative for: history of anesthetic complications  Airway Mallampati: III  TM Distance: >3 FB Neck ROM: full    Dental  (+) Chipped   Pulmonary neg shortness of breath, sleep apnea ,           Cardiovascular Exercise Tolerance: Good hypertension, (-) angina+ CAD and +CHF  (-) Past MI + dysrhythmias      Neuro/Psych  Neuromuscular disease CVA, Residual Symptoms negative psych ROS   GI/Hepatic Neg liver ROS, GERD  ,  Endo/Other  negative endocrine ROS  Renal/GU      Musculoskeletal   Abdominal   Peds  Hematology negative hematology ROS (+)   Anesthesia Other Findings Past Medical History: No date: Allergic rhinitis 1972-1974: 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 sleep study 06/03/2012 - AHI 54.74/hr during               total sleep, AHI 49.66/hr during REM 2018: Prostate cancer ([place])     Comment:  radiation 2005: S/P CABG x 4 2011: Stroke [facility])     Comment:  damage to back of brain, difficulty when turning with               ambulation No date: Synovitis and tenosynovitis, unspecified No date: Tubular adenoma of colon No date:  Unspecified vitamin D deficiency  Past Surgical History: 02/01/2004: CARDIAC CATHETERIZATION     Comment:  L main with 60% prox stenosis, LAD calcified in prox               section with 95% ostial lesion, diffusion 50-60% disease               in mid LAD with focal 90% mid LAD stenosis; 1st diagonal               coarsely irregular with up to 30% stenosis in prox & mid               segments; L Cfx with 3 OMs, AV groove Cfx with 40-50%               stenosis; 3rd OM with 50% prox stenosis; RCA with PDA &               PLA, PLA with 95% osital lesion (Dr. Gerrie Nordmann) No date: CATARACT EXTRACTION, BILATERAL No date: COLONOSCOPY 02/02/2004: CORONARY ARTERY BYPASS GRAFT     Comment:  LIMA to DX1, RIMA to RCA, SVG to distal LAD, SVG to Cfx               marginal (Dr. Remus Loffler) No date: EYE SURGERY; Bilateral     Comment:  cataract extractions 02/28/2016: gold seed marker placement/prostate  2002: [place] 2014: JOINT REPLACEMENT; Left     Comment:  knee 05/17/2018: JOINT REPLACEMENT; Right     Comment:  knee 05/17/2018: KNEE ARTHROPLASTY; Right     Comment:  Procedure: COMPUTER ASSISTED TOTAL KNEE ARTHROPLASTY;                Surgeon: Dereck Leep, MD;  Location: [facility] ORS;                Service: Orthopedics;  Laterality: Right; 06/2012: NM MYOCAR PERF WALL MOTION     Comment:  lexiscan myoview - normal low risk study, EF 47% 08/04/2017: PAROTIDECTOMY; Left     Comment:  Procedure: SUPERFICIAL PAROTIDECTOMY;  Surgeon: Beverly Gust, MD;  Location: [facility] ORS;  Service: ENT;                Laterality: Left; 1972: SOFT TISSUE TUMOR RESECTION     Comment:  of right jaw x3, pathology unknown, 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 Marry Guan [facility], [place]) 06/2012: TRANSTHORACIC ECHOCARDIOGRAM     Comment:  EF 50-55%, mild MR; LA mild-mod dilated; RVSP increased No date: UPPER  GASTROINTESTINAL ENDOSCOPY     Reproductive/Obstetrics negative OB ROS                             Anesthesia Physical Anesthesia Plan  ASA: IV  Anesthesia Plan: Spinal   Post-op Pain Management:    Induction:   PONV Risk Score and Plan:   Airway Management Planned: Natural Airway and Nasal Cannula  Additional Equipment:   Intra-op Plan:   Post-operative Plan:   Informed Consent: I have reviewed the patients History and Physical, chart, labs and discussed the procedure including the risks, benefits and alternatives for the proposed anesthesia with the patient or authorized representative who has indicated his/her understanding and acceptance.     Dental Advisory Given  Plan Discussed with: Anesthesiologist, CRNA and Surgeon  Anesthesia Plan Comments: (Patient requests spinal.  Patient consented that spinal will increase his same day discharge time.  Patient voiced understanding.  Patient reports no bleeding problems and no anticoagulant use.  Plan for spinal with backup GA  Patient consented for risks of anesthesia including but not limited to:  - adverse reactions to medications - risk of bleeding, infection, nerve damage and headache - risk of failed spinal - damage to teeth, lips or other oral mucosa - sore throat or hoarseness - Damage to heart, brain, lungs or loss of life  Patient voiced understanding.)      Anesthesia Quick Evaluation

## 2019-01-06 ENCOUNTER — Encounter: Payer: Self-pay | Admitting: Orthopedic Surgery

## 2019-01-06 NOTE — Discharge Summary (Signed)
Physician Discharge Summary  Subjective: 1 Day Post-Op Procedure(s): ARTHROSCOPY KNEE, SYNVECTOMY, LYSIS OF ADHESIONS Patient reports pain as mild.   Patient seen in rounds with Dr. Marry Guan. Patient is well, and has had no acute complaints or problems Patient is ready to go home  Physician Discharge Summary  Patient ID: Wesley [name] MRN: [id number] DOB/AGE: 79/08/1940 79 y.o.  Admit date: 01/05/2019 Discharge date: 01/06/2019  Admission Diagnoses:  Discharge Diagnoses:  Active Problems:   S/P right knee arthroscopy   Discharged Condition: good  Hospital Course: The patient is postop day 1 from a right knee arthroscopy for arthrofibrosis.  The patient had spinal anesthesia and could not void after surgery.  He was kept overnight but is now presently voiding with no problem.  He is ready to go home with excellent pain control.  He is physical therapy this morning outpatient  Treatments: surgery:   Attempted manipulation of the right knee under anesthesia, right knee arthroscopy and lysis of adhesions  SURGEON:  Marciano Sequin., M.D.          ASSISTANT: None  ANESTHESIA: spinal  ESTIMATED BLOOD LOSS: Minimal  FLUIDS REPLACED: 1200 mL of crystalloid  TOURNIQUET TIME: Not used  DRAINS: None  Discharge Exam: Blood pressure 117/69, pulse 68, temperature (!) 97.5 F (36.4 C), temperature source Oral, resp. rate 18, SpO2 95 %.   Disposition: Discharge disposition: 01-Home or Self Care       Discharge Instructions    Call MD / Call 911   Complete by: As directed    If you experience chest pain or shortness of breath, CALL 911 and be transported to the hospital emergency room.  If you develope a fever above 101 F, pus (white drainage) or increased drainage or redness at the wound, or calf pain, call your surgeon's office.   Constipation Prevention   Complete by: As directed    Drink plenty of fluids.  Prune juice may be helpful.  You may use a stool  softener, such as Colace (over the counter) 100 mg twice a day.  Use MiraLax (over the counter) for constipation as needed.   DO NOT drive, shower or take a tub bath until instructed by your physician   Complete by: As directed    Diet general   Complete by: As directed    Increase activity slowly as tolerated   Complete by: As directed    Weight bearing as tolerated   Complete by: As directed    Laterality: bilateral     Allergies as of 01/06/2019   No Known Allergies     Medication List    TAKE these medications   aspirin EC 81 MG tablet Take 81 mg by mouth daily with lunch.   aspirin-acetaminophen-caffeine 250-250-65 MG tablet Commonly known as: EXCEDRIN MIGRAINE Take 2 tablets by mouth every 6 (six) hours as needed for headache.   atorvastatin 80 MG tablet Commonly known as: LIPITOR Take 80 mg by mouth at bedtime.   carvedilol 6.25 MG tablet Commonly known as: COREG Take 6.25 mg by mouth daily.   Coenzyme Q10 100 MG capsule Take 100 mg by mouth daily with lunch.   CRANBERRY PO Take 4,200 mg by mouth daily with lunch.   diphenhydramine-acetaminophen 25-500 MG Tabs tablet Commonly known as: TYLENOL PM Take 2 tablets by mouth at bedtime as needed (for pain).   docusate calcium 240 MG capsule Commonly known as: SURFAK Take 480 mg by mouth at bedtime.   furosemide 40  MG tablet Commonly known as: LASIX Take 40 mg by mouth daily.   HYDROcodone-acetaminophen 5-325 MG tablet Commonly known as: Norco Take 1-2 tablets by mouth every 4 (four) hours as needed for moderate pain.   ICAPS AREDS 2 PO Take 1 capsule by mouth daily.   CENTRUM SILVER PO Take 1 tablet by mouth daily.   L-Carnitine 500 MG Caps Take 500 mg by mouth daily with lunch.   L-Lysine 500 MG Caps Take 500 mg by mouth at bedtime.   losartan 50 MG tablet Commonly known as: COZAAR Take 50 mg by mouth daily.   Lumigan 0.01 % Soln Generic drug: bimatoprost Place 1 drop into both eyes at  bedtime.   magnesium oxide 400 MG tablet Commonly known as: MAG-OX Take 400 mg by mouth daily.   meloxicam 15 MG tablet Commonly known as: MOBIC Take 15 mg by mouth at bedtime.   OMEGA-3-6-9 PO Take 1 capsule by mouth daily.   pantoprazole 40 MG tablet Commonly known as: PROTONIX Take 40 mg by mouth at bedtime.   PROBIOTIC ACIDOPHILUS PO Take 1 capsule by mouth daily.   Selenium 200 MCG Caps Take 200 mcg by mouth daily.   spironolactone 25 MG tablet Commonly known as: ALDACTONE Take 25 mg by mouth daily.   tamsulosin 0.4 MG Caps capsule Commonly known as: FLOMAX Take 0.4 mg by mouth at bedtime.   traMADol 50 MG tablet Commonly known as: ULTRAM Take by mouth every 6 (six) hours as needed.   Visine 0.05 % ophthalmic solution Generic drug: tetrahydrozoline Place 1 drop into both eyes daily as needed (for dry eyes).   vitamin B-12 1000 MCG tablet Commonly known as: CYANOCOBALAMIN Take 1,000 mcg by mouth daily.   vitamin C 1000 MG tablet Take 1,000 mg by mouth daily.   zinc gluconate 50 MG tablet Take 50 mg by mouth daily.            Discharge Care Instructions  (From admission, onward)         Start     Ordered   01/05/19 0000  Weight bearing as tolerated    Question:  Laterality  Answer:  bilateral   01/05/19 1830         Follow-up Information    Watt Climes, PA On 01/12/2019.   Specialty: Physician Assistant Why: at 10:45am Contact information: [place] [address] [phone]        Dereck Leep, MD On 02/17/2019.   Specialty: Orthopedic Surgery Why: at 9:30am Contact information: [place] [address] [phone]           Signed: Prescott Parma, Lanika Colgate 01/06/2019, 6:36 AM   Objective: Vital signs in last 24 hours: Temp:  [97.3 F (36.3 C)-98.6 F (37 C)] 97.5 F (36.4 C) (07/16 0320) Pulse Rate:  [52-68] 68 (07/16 0320) Resp:  [12-22] 18 (07/16 0320) BP:  (103-141)/(48-75) 117/69 (07/16 0320) SpO2:  [94 %-100 %] 95 % (07/16 0320)  Intake/Output from previous day:  Intake/Output Summary (Last 24 hours) at 01/06/2019 0636 Last data filed at 01/06/2019 0600 Gross per 24 hour  Intake 3102.2 ml  Output 2410 ml  Net 692.2 ml    Intake/Output this shift: Total I/O In: 1702.2 [I.V.:1502.2; IV Piggyback:200] Out: 1200 [Urine:1200]  Labs: No results for input(s): HGB in the last 72 hours. No results for input(s): WBC, RBC, HCT, PLT in the last 72 hours. No results for input(s): NA, K, CL,  CO2, BUN, CREATININE, GLUCOSE, CALCIUM in the last 72 hours. No results for input(s): LABPT, INR in the last 72 hours.  EXAM: General - Patient is Alert and Oriented Extremity - Sensation intact distally Dorsiflexion/Plantar flexion intact Compartment soft Incision - clean, dry, no drainage Motor Function -plantarflexion and dorsiflexion are intact.  Assessment/Plan: 1 Day Post-Op Procedure(s): ARTHROSCOPY KNEE, SYNVECTOMY, LYSIS OF ADHESIONS Procedure(s): ARTHROSCOPY KNEE, SYNVECTOMY, LYSIS OF ADHESIONS Past Medical History:  Diagnosis Date  . Allergic rhinitis   . 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 CHF (congestive heart failure) ([place])   . Chronic back pain    no surgery  . Coronary atherosclerosis of autologous vein bypass graft   . Dysrhythmia    irregular but not specifically diagnosed  . Essential hypertension, benign    pt denies 04/07/14  . GERD (gastroesophageal reflux disease)   . Hyperlipidemia   . Intermittent vertigo   . Labral tear of shoulder    w/ fracture  . Macular degeneration   . OSA (obstructive sleep apnea)    NOP CPAP. sleep study 06/03/2012 - AHI 54.74/hr during total sleep, AHI 49.66/hr during REM  . Prostate cancer ([place]) 2018   radiation  . S/P CABG x 4 2005  . Stroke [facility]) 2011   damage to back of brain, difficulty when turning with ambulation  . Synovitis and tenosynovitis, unspecified   . Tubular adenoma of  colon   . Unspecified vitamin D deficiency    Active Problems:   S/P right knee arthroscopy  Estimated body mass index is 31.31 kg/m as calculated from the following:   Height as of 12/31/18: 5\' 6"  (1.676 m).   Weight as of 05/19/18: 88 kg. Advance diet Up with therapy D/C IV fluids Diet - Regular diet Follow up - in 1 week Activity - WBAT Disposition - Home Condition Upon Discharge - Good DVT Prophylaxis - None  Reche Dixon, PA-C Orthopaedic Surgery 01/06/2019, 6:36 AM

## 2019-01-06 NOTE — TOC Transition Note (Signed)
Transition of Care [facility]) - CM/SW Discharge Note   Patient Details  Name: Wesley [name] MRN: [id number] Date of Birth: 08-02-1940  [facility] CM/SW Contact:  Su Hilt, RN Phone Number: 01/06/2019, 8:54 AM   Clinical Narrative:    Patient to DC home today with his girlfriend of 80 years, she will provide transportation  He lives alone and she will come to take care of him as needed He has DME at home including, cane, walker, BSC, shower chair, he will not be taking Lovenox due to already of Blood thinners PO He has an Outpaient PT appointment this am at 1040 and will not be doing HH No further needs   Final next level of care: Home w Home Health Services Barriers to Discharge: Barriers Resolved   Patient Goals and CMS Choice Patient states their goals for this hospitalization and ongoing recovery are:: GO home      Discharge Placement                       Discharge Plan and Services   Discharge Planning Services: CM Consult            DME Arranged: N/A         HH Arranged: NA          Social Determinants of Health (SDOH) Interventions     Readmission Risk Interventions No flowsheet data found.

## 2019-01-06 NOTE — Plan of Care (Signed)
Pt admitted to unit. No c/o pain, no acute distress noted. Resting quietly.

## 2019-01-06 NOTE — Progress Notes (Signed)
DISCHARGE NOTE:  Pt given discharge instructions. Pt verbalized understanding. Per pts request he was wheeled to out pt rehab by staff member.

## 2019-01-06 NOTE — Progress Notes (Signed)
  Subjective: 1 Day Post-Op Procedure(s): ARTHROSCOPY KNEE, SYNVECTOMY, LYSIS OF ADHESIONS Patient reports pain as mild.   Patient seen in rounds with Dr. Marry Guan. Patient is well, and has had no acute complaints or problems Plan is to go Home after hospital stay. Negative for chest pain and shortness of breath Fever: no Gastrointestinal: Negative for nausea and vomiting  Objective: Vital signs in last 24 hours: Temp:  [97.3 F (36.3 C)-98.6 F (37 C)] 97.5 F (36.4 C) (07/16 0320) Pulse Rate:  [52-68] 68 (07/16 0320) Resp:  [12-22] 18 (07/16 0320) BP: (103-141)/(48-75) 117/69 (07/16 0320) SpO2:  [94 %-100 %] 95 % (07/16 0320)  Intake/Output from previous day:  Intake/Output Summary (Last 24 hours) at 01/06/2019 0633 Last data filed at 01/06/2019 0600 Gross per 24 hour  Intake 3102.2 ml  Output 2410 ml  Net 692.2 ml    Intake/Output this shift: Total I/O In: 1702.2 [I.V.:1502.2; IV Piggyback:200] Out: 1200 [Urine:1200]  Labs: No results for input(s): HGB in the last 72 hours. No results for input(s): WBC, RBC, HCT, PLT in the last 72 hours. No results for input(s): NA, K, CL, CO2, BUN, CREATININE, GLUCOSE, CALCIUM in the last 72 hours. No results for input(s): LABPT, INR in the last 72 hours.   EXAM General - Patient is Alert and Oriented Extremity - Neurovascular intact Sensation intact distally Compartment soft Dressing/Incision - clean, dry, no drainage Motor Function - intact, moving foot and toes well on exam.  Able to straight leg raise independently  Past Medical History:  Diagnosis Date  . Allergic rhinitis   . 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 CHF (congestive heart failure) ([place])   . Chronic back pain    no surgery  . Coronary atherosclerosis of autologous vein bypass graft   . Dysrhythmia    irregular but not specifically diagnosed  . Essential hypertension, benign    pt denies 04/07/14  . GERD (gastroesophageal reflux disease)   . Hyperlipidemia    . Intermittent vertigo   . Labral tear of shoulder    w/ fracture  . Macular degeneration   . OSA (obstructive sleep apnea)    NOP CPAP. sleep study 06/03/2012 - AHI 54.74/hr during total sleep, AHI 49.66/hr during REM  . Prostate cancer ([place]) 2018   radiation  . S/P CABG x 4 2005  . Stroke [facility]) 2011   damage to back of brain, difficulty when turning with ambulation  . Synovitis and tenosynovitis, unspecified   . Tubular adenoma of colon   . Unspecified vitamin D deficiency     Assessment/Plan: 1 Day Post-Op Procedure(s): ARTHROSCOPY KNEE, SYNVECTOMY, LYSIS OF ADHESIONS Active Problems:   S/P right knee arthroscopy  Estimated body mass index is 31.31 kg/m as calculated from the following:   Height as of 12/31/18: 5\' 6"  (1.676 m).   Weight as of 05/19/18: 88 kg. Advance diet Up with therapy D/C IV fluids Discharge home today.  DVT Prophylaxis - None Weight-Bearing as tolerated to right leg  Reche Dixon, PA-C Orthopaedic Surgery 01/06/2019, 6:33 AM

## 2019-04-21 ENCOUNTER — Encounter: Payer: Self-pay | Admitting: Gastroenterology

## 2019-05-03 IMAGING — US IR BIOPSY CORE SALIVARY GLAND
1 series · 13 of 14 positions shown · non-contrast
Comparison: Soft tissue neck ultrasound - earlier same day

MEDICATIONS:
None

ANESTHESIA/SEDATION:
None

COMPLICATIONS:
None immediate.

INDICATION: Palpable left parotid nodule. Please perform ultrasound-guided
biopsy for tissue diagnostic purposes.

EXAM:
ULTRASOUND-GUIDED LEFT PAROTID NODULE BIOPSY
TECHNIQUE: Informed written consent was obtained from the patient after a
discussion of the risks, benefits and alternatives to treatment.
Questions regarding the procedure were encouraged and answered.
Initial ultrasound scanning demonstrated unchanged size and
appearance of the approximately 2.4 cm partially cystic, partially
solid nodule within the posterior aspect the left parotid gland. An
ultrasound image was saved for documentation purposes. The procedure
was planned. A timeout was performed prior to the initiation of the
procedure.

[Series 1: ir biopsy core salivary gland · 0.04mm/px · 13 of 14 slices shown]
[im 1/14]
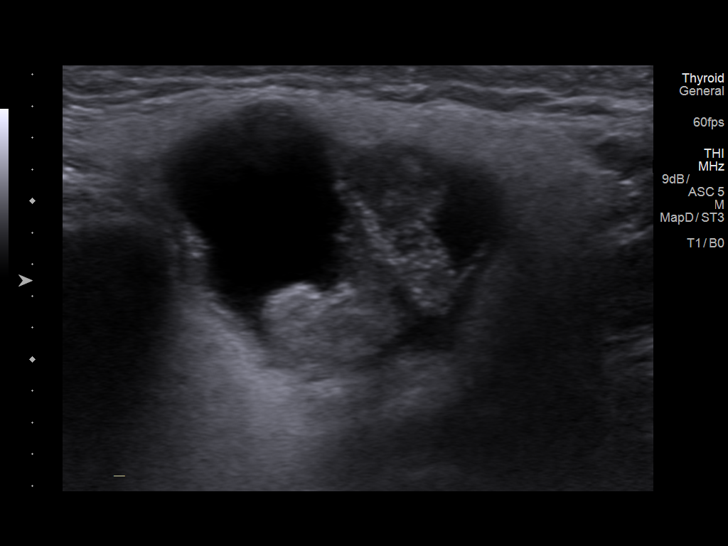
[im 2/14]
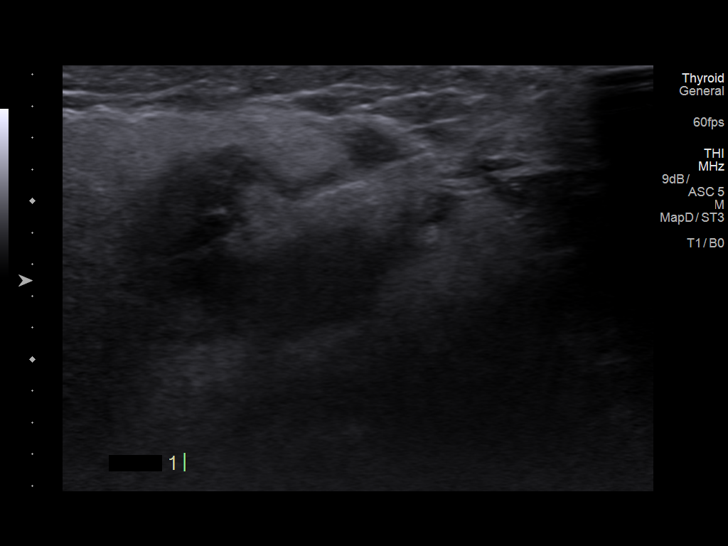
[im 3/14]
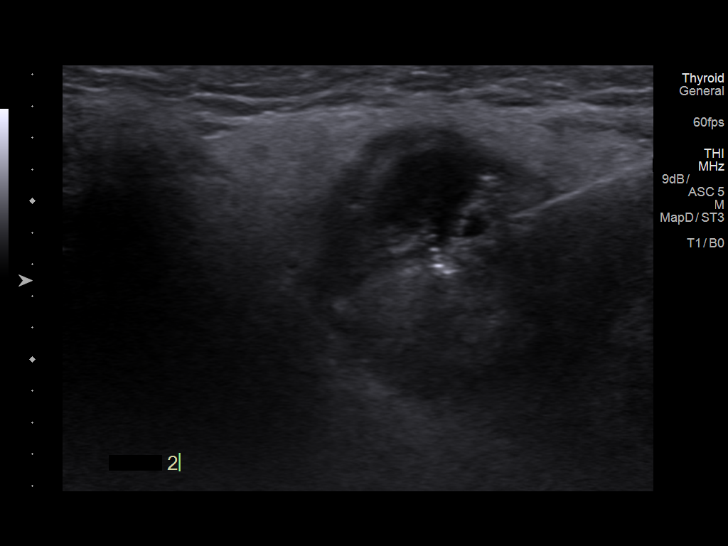
[im 4/14]
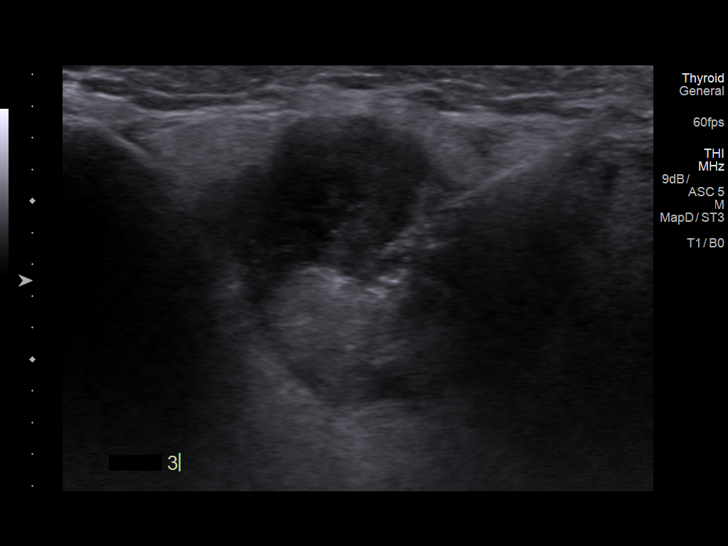
[im 5/14]
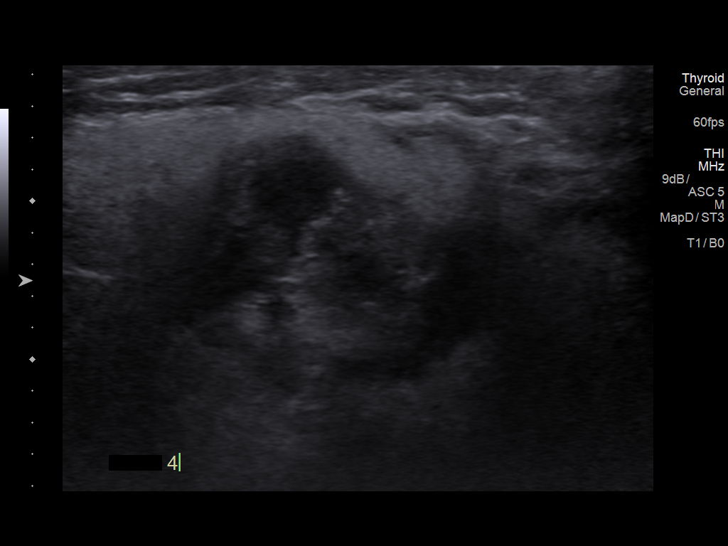
[im 6/14]
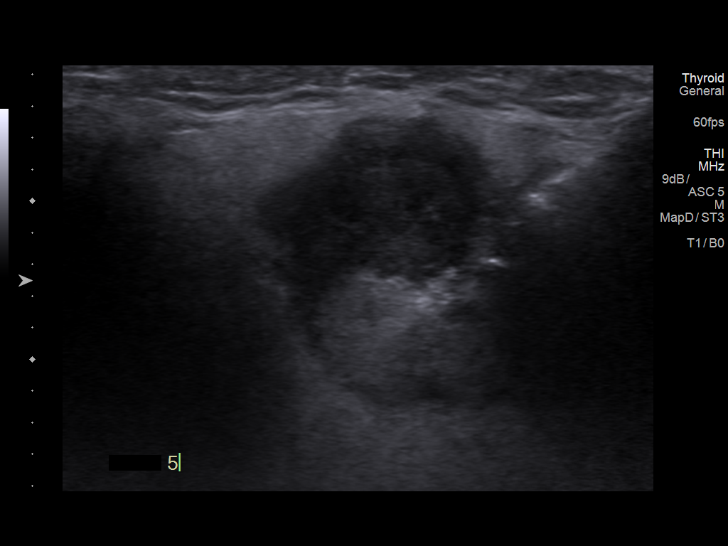
[im 8/14]
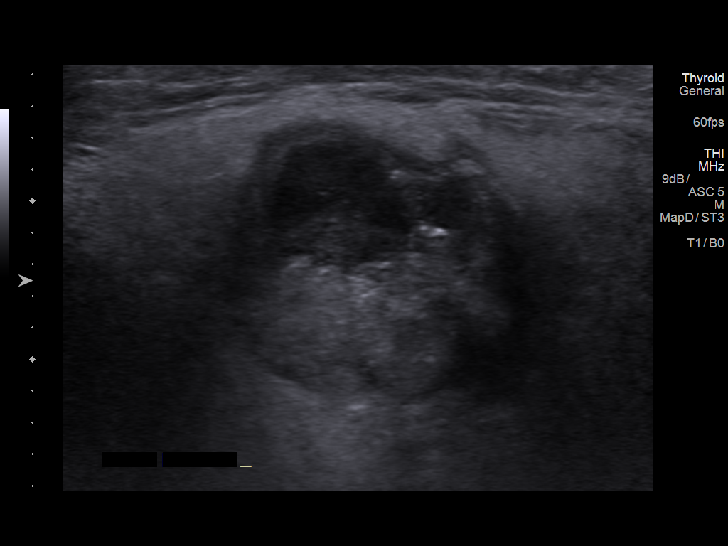
[im 9/14]
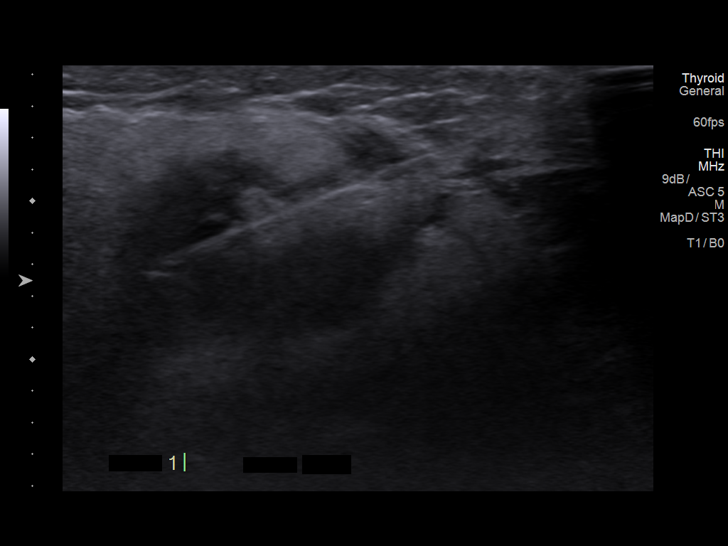
[im 10/14]
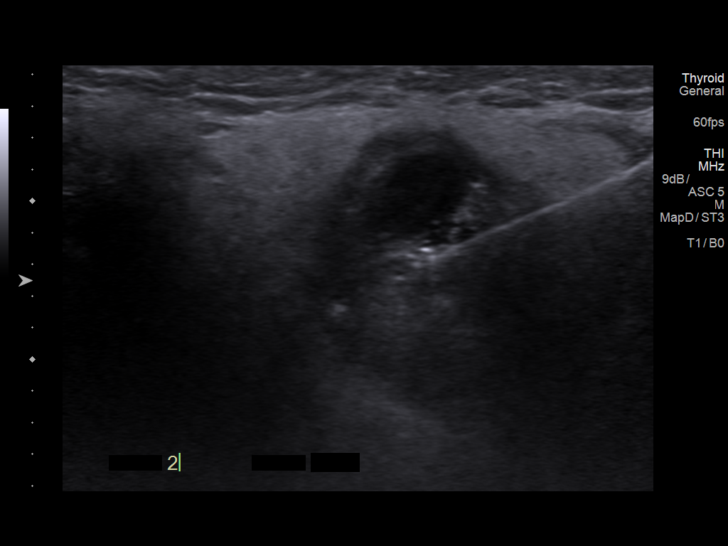
[im 11/14]
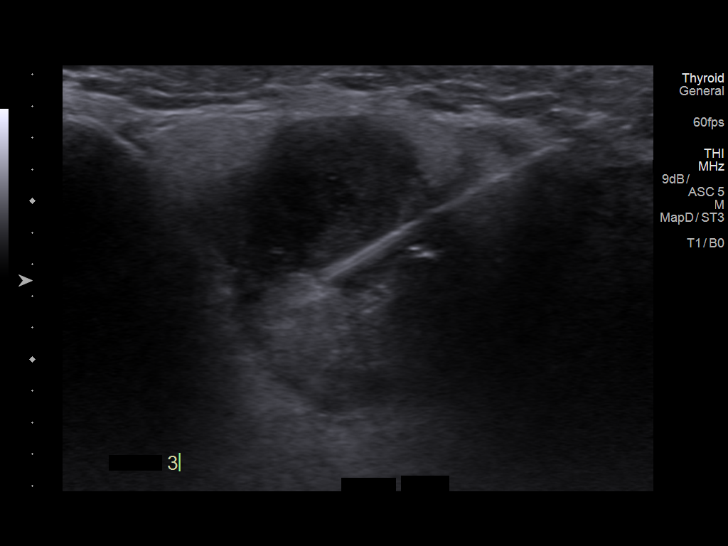
[im 12/14]
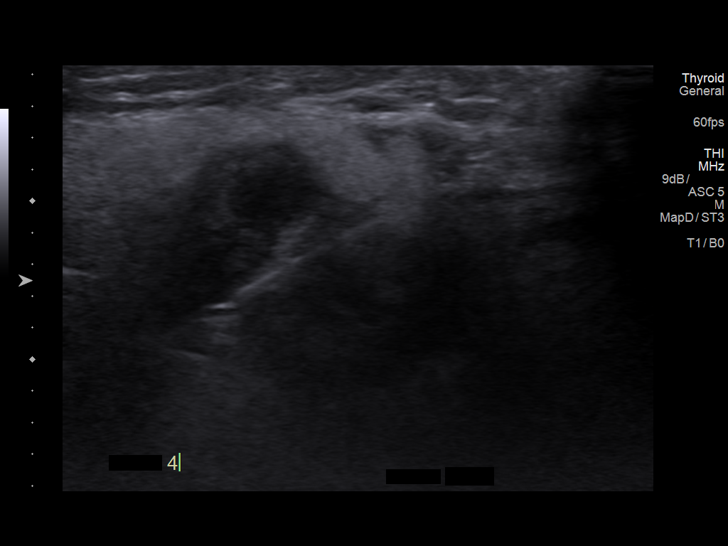
[im 13/14]
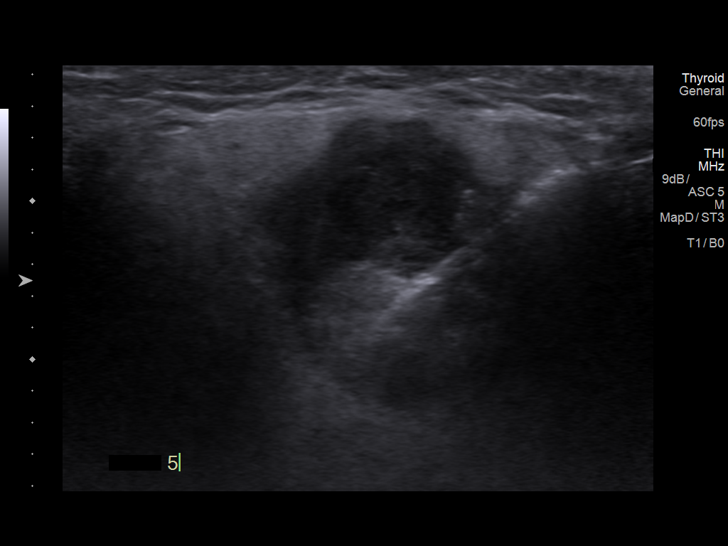
[im 14/14]
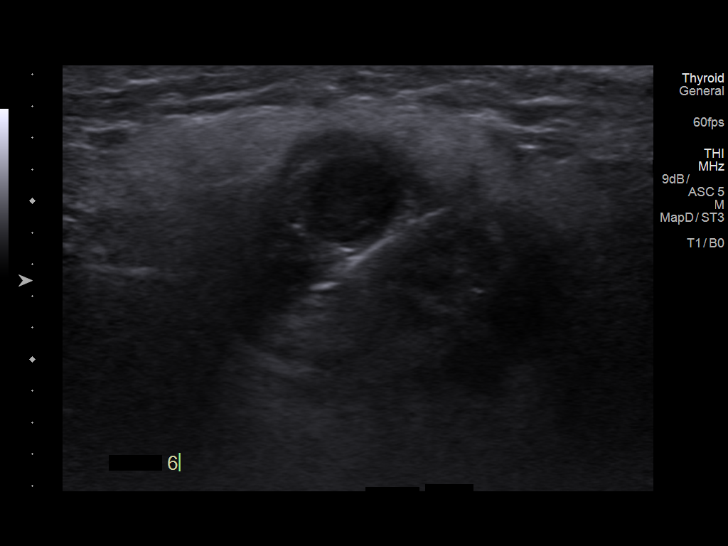

[13 of 14 positions shown; findings below may reference images not displayed]

The operative was prepped and draped in the usual sterile fashion,
and a sterile drape was applied covering the operative field. A
timeout was performed prior to the initiation of the procedure.
Local anesthesia was provided with 1% lidocaine with epinephrine.

Under direct ultrasound guidance, an 18 gauge core needle device was
utilized to obtain to obtain 6 core needle biopsies of the
indeterminate complex parotid nodule.

The samples were placed in saline and submitted to pathology. The
needle was removed and hemostasis was achieved with manual
compression. Post procedure scan was negative for significant
hematoma. A dressing was placed. The patient tolerated the procedure
well without immediate postprocedural complication.
IMPRESSION: Technically successful ultrasound guided biopsy of indeterminate
complex left parotid nodule.

## 2019-05-03 IMAGING — US US SOFT TISSUE HEAD/NECK
1 series · 14 of 14 positions shown · non-contrast
Comparison: None.

CLINICAL DATA: Palpable abnormality involving the left parotid
gland.

EXAM:
ULTRASOUND OF HEAD/NECK SOFT TISSUES
TECHNIQUE: Ultrasound examination of the head and neck soft tissues was
performed in the area of clinical concern.

[Series 1: us soft tissue head/neck · 0.07mm/px · 14 of 14 slices shown]
[im 1/14]
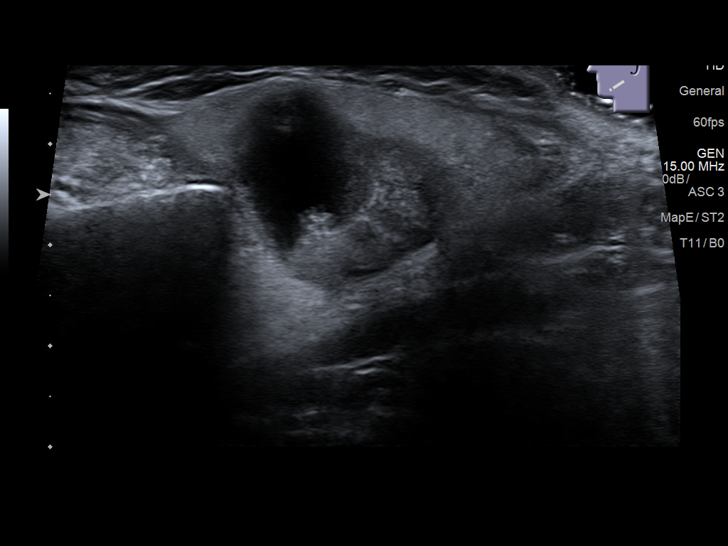
[im 2/14]
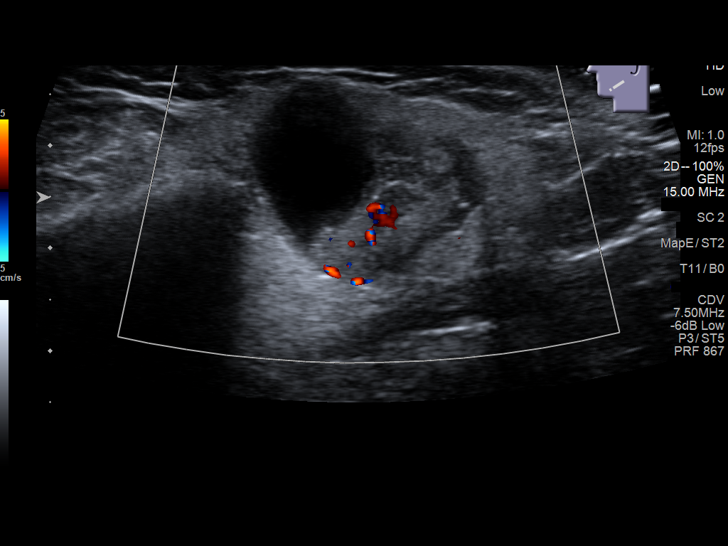
[im 3/14]
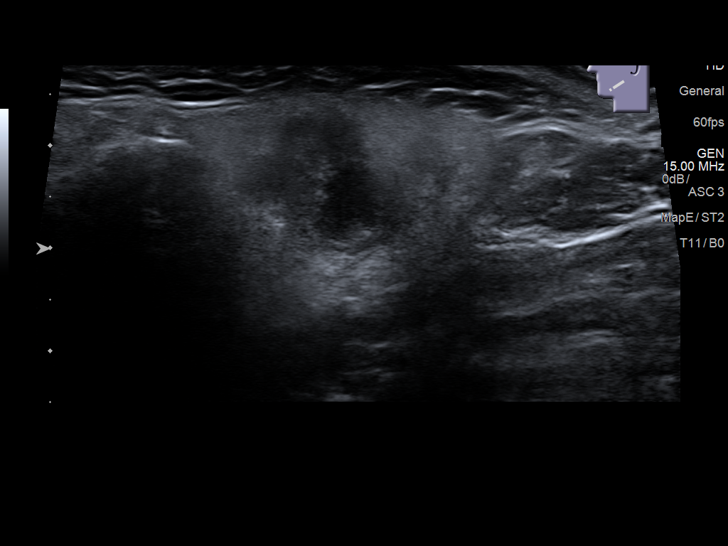
[im 4/14]
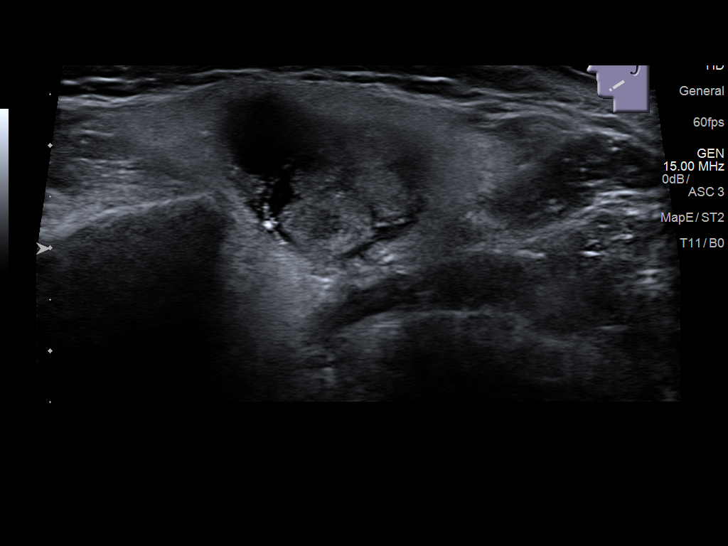
[im 5/14]
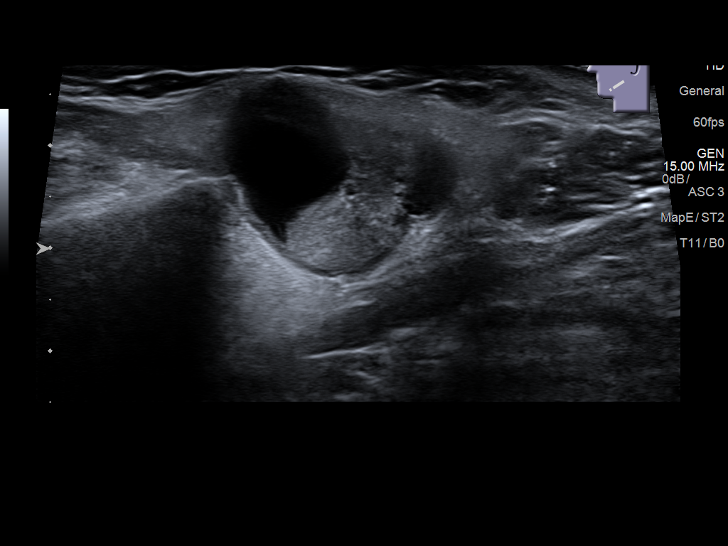
[im 6/14]
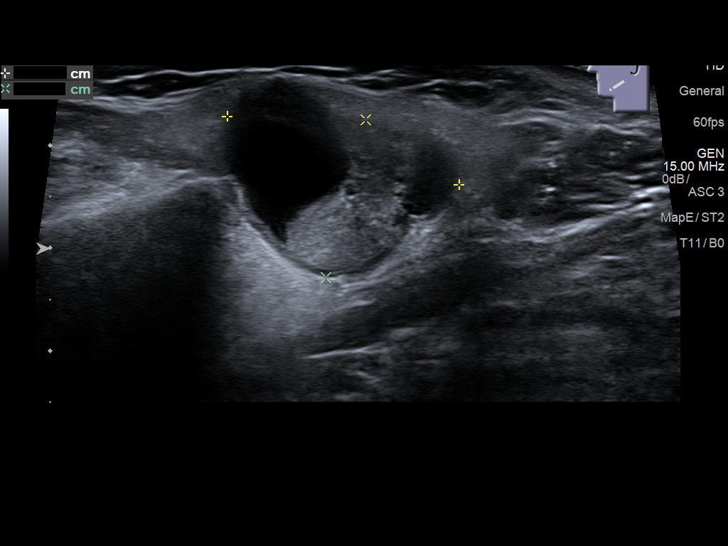
[im 7/14]
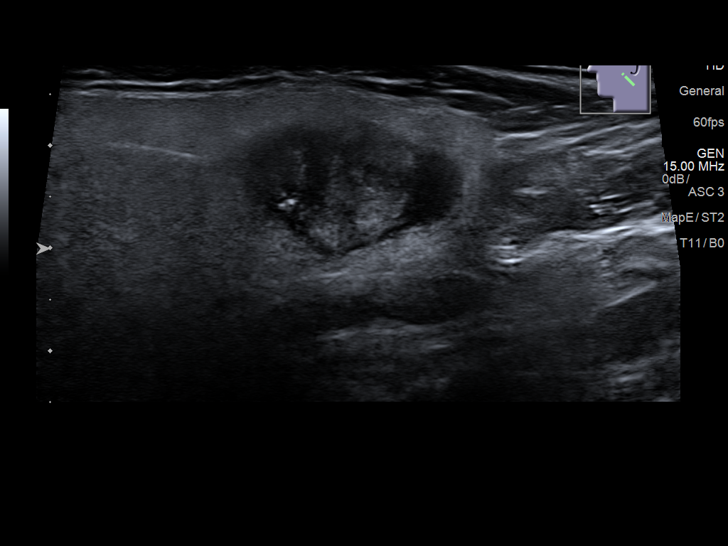
[im 8/14]
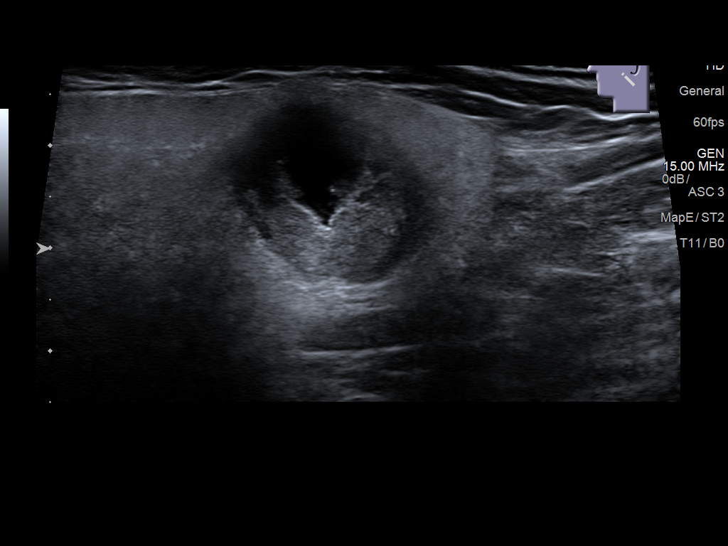
[im 9/14]
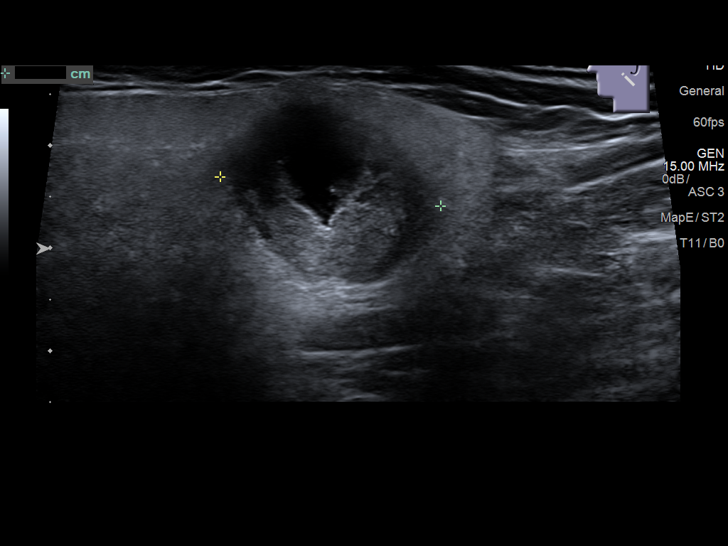
[im 10/14]
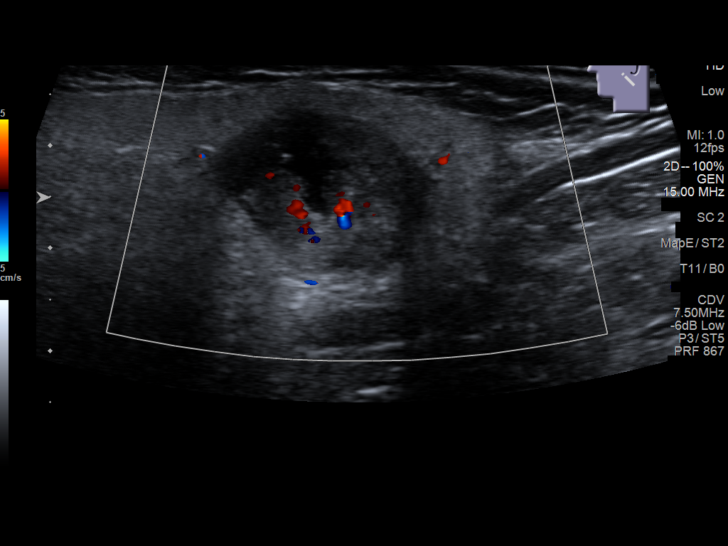
[im 11/14]
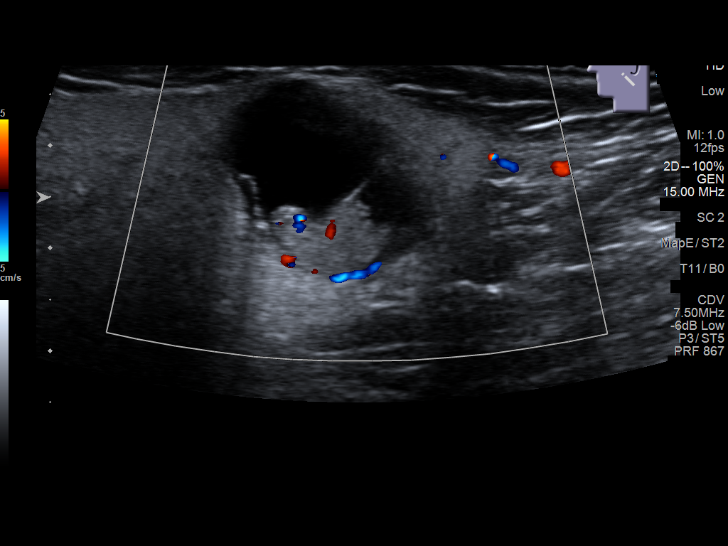
[im 12/14]
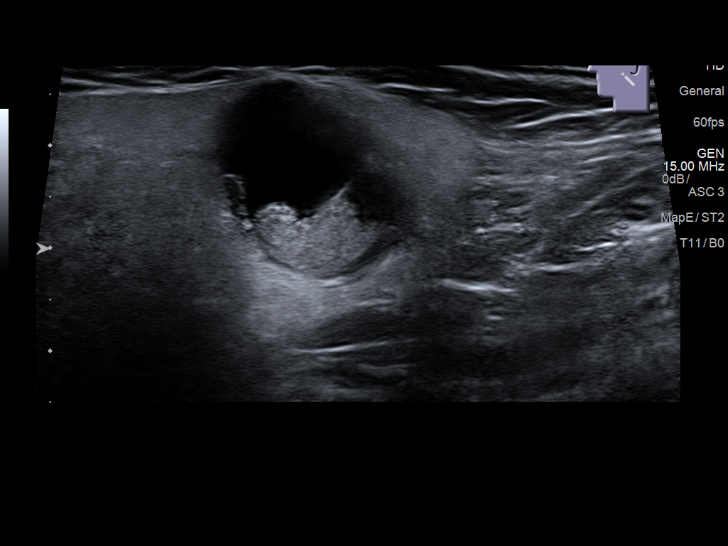
[im 13/14]
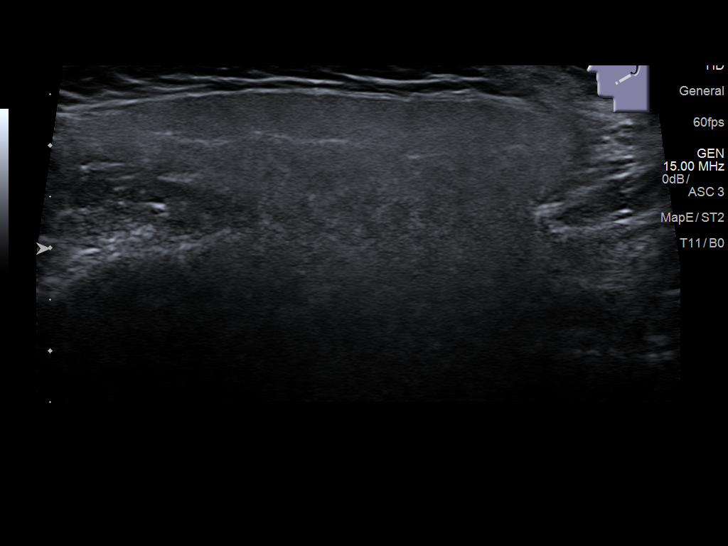
[im 14/14]
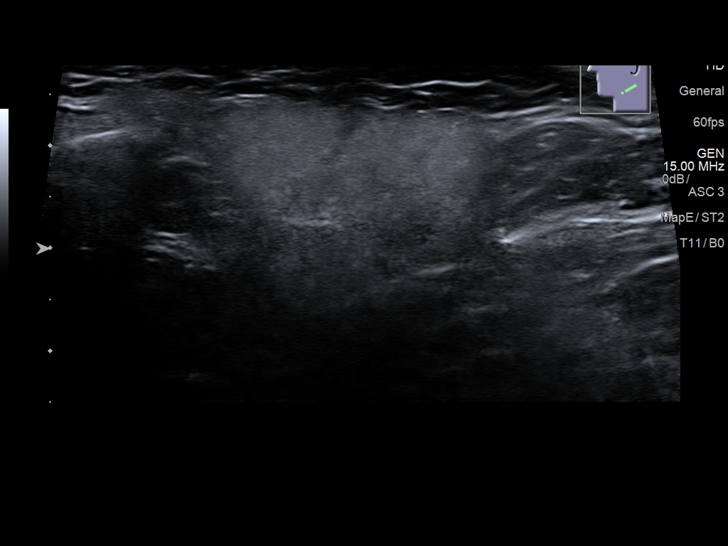

[14 of 14 positions shown; findings below may reference images not displayed]

FINDINGS: Patient's palpable area of concern correlates with a partially
cystic, partially solid approximately 2.4 x 1.6 x 2.2 cm nodule
within the posterior aspect of the left parotid gland. A small
amount of blood flow was demonstrated within the solid component of
this complex nodule.
IMPRESSION: Patient's palpable area of concern correlates with an approximately
2.4 cm partially solid, partially cystic indeterminate nodule within
the left parotid gland.

PLAN:
Patient subsequently underwent ultrasound-guided left parotid nodule
biopsy.

## 2019-10-02 DIAGNOSIS — I77810 Thoracic aortic ectasia: Secondary | ICD-10-CM | POA: Insufficient documentation

## 2019-10-02 DIAGNOSIS — I5022 Chronic systolic (congestive) heart failure: Secondary | ICD-10-CM | POA: Insufficient documentation

## 2019-10-22 DIAGNOSIS — I712 Thoracic aortic aneurysm, without rupture, unspecified: Secondary | ICD-10-CM

## 2019-10-22 HISTORY — DX: Thoracic aortic aneurysm, without rupture: I71.2

## 2019-10-22 HISTORY — DX: Thoracic aortic aneurysm, without rupture, unspecified: I71.20

## 2019-10-27 ENCOUNTER — Emergency Department
Admission: EM | Admit: 2019-10-27 | Discharge: 2019-10-27 | Disposition: A | Discharge status: Home / Self Care | Payer: Medicare Other | Attending: Emergency Medicine | Admitting: Emergency Medicine

## 2019-10-27 ENCOUNTER — Emergency Department: Payer: Medicare Other

## 2019-10-27 ENCOUNTER — Other Ambulatory Visit: Payer: Self-pay

## 2019-10-27 DIAGNOSIS — I712 Thoracic aortic aneurysm, without rupture, unspecified: Secondary | ICD-10-CM

## 2019-10-27 DIAGNOSIS — R079 Chest pain, unspecified: Secondary | ICD-10-CM

## 2019-10-27 DIAGNOSIS — I2510 Atherosclerotic heart disease of native coronary artery without angina pectoris: Secondary | ICD-10-CM | POA: Insufficient documentation

## 2019-10-27 DIAGNOSIS — Z951 Presence of aortocoronary bypass graft: Secondary | ICD-10-CM | POA: Diagnosis not present

## 2019-10-27 DIAGNOSIS — N201 Calculus of ureter: Secondary | ICD-10-CM | POA: Insufficient documentation

## 2019-10-27 DIAGNOSIS — I110 Hypertensive heart disease with heart failure: Secondary | ICD-10-CM | POA: Insufficient documentation

## 2019-10-27 DIAGNOSIS — I509 Heart failure, unspecified: Secondary | ICD-10-CM | POA: Diagnosis not present

## 2019-10-27 DIAGNOSIS — R0602 Shortness of breath: Secondary | ICD-10-CM | POA: Insufficient documentation

## 2019-10-27 DIAGNOSIS — Z79899 Other long term (current) drug therapy: Secondary | ICD-10-CM | POA: Diagnosis not present

## 2019-10-27 DIAGNOSIS — R0789 Other chest pain: Secondary | ICD-10-CM | POA: Insufficient documentation

## 2019-10-27 LAB — BASIC METABOLIC PANEL
Anion gap: 6 (ref 5–15)
BUN: 26 mg/dL — ABNORMAL HIGH (ref 8–23)
CO2: 25 mmol/L (ref 22–32)
Calcium: 9.8 mg/dL (ref 8.9–10.3)
Chloride: 109 mmol/L (ref 98–111)
Creatinine, Ser: 1.21 mg/dL (ref 0.61–1.24)
GFR calc Af Amer: >60 mL/min (ref >60)
GFR calc non Af Amer: 57 mL/min — ABNORMAL LOW (ref >60)
Glucose, Bld: 112 mg/dL — ABNORMAL HIGH (ref 70–99)
Potassium: 5.5 mmol/L — ABNORMAL HIGH (ref 3.5–5.1)
Sodium: 140 mmol/L (ref 135–145)

## 2019-10-27 LAB — PROTIME-INR
INR: 0.9 (ref 0.8–1.2)
Prothrombin Time: 11.6 seconds (ref 11.4–15.2)

## 2019-10-27 LAB — CBC
HCT: 38.8 % — ABNORMAL LOW (ref 39.0–52.0)
Hemoglobin: 12.5 g/dL — ABNORMAL LOW (ref 13.0–17.0)
MCH: 30.1 pg (ref 26.0–34.0)
MCHC: 32.2 g/dL (ref 30.0–36.0)
MCV: 93.5 fL (ref 80.0–100.0)
Platelets: 238 10*3/uL (ref 150–400)
RBC: 4.15 MIL/uL — ABNORMAL LOW (ref 4.22–5.81)
RDW: 12.7 % (ref 11.5–15.5)
WBC: 10.1 10*3/uL (ref 4.0–10.5)
nRBC: 0 % (ref 0.0–0.2)

## 2019-10-27 LAB — TROPONIN I (HIGH SENSITIVITY)
Troponin I (High Sensitivity): 12 ng/L (ref <18)
Troponin I (High Sensitivity): 7 ng/L (ref <18)

## 2019-10-27 MED ORDER — FAMOTIDINE 20 MG PO TABS: 20.0000 mg | ORAL_TABLET | Freq: Two times a day (BID) | ORAL | 0 refills | Status: DC

## 2019-10-27 MED ORDER — NITROGLYCERIN 0.4 MG SL SUBL: 0.4000 mg | SUBLINGUAL_TABLET | SUBLINGUAL | Status: DC | PRN

## 2019-10-27 MED ORDER — ACETAMINOPHEN 500 MG PO TABS: 1000.0000 mg | ORAL_TABLET | Freq: Once | ORAL | Status: DC | PRN

## 2019-10-27 MED ORDER — IOHEXOL 350 MG/ML SOLN
75.0000 mL | Freq: Once | INTRAVENOUS | Status: AC | PRN
  Administered 2019-10-27: 17:00:00 75 mL via INTRAVENOUS

## 2019-10-27 MED ORDER — ASPIRIN 81 MG PO CHEW
324.0000 mg | CHEWABLE_TABLET | Freq: Once | ORAL | Status: AC
  Administered 2019-10-27: 324 mg via ORAL
  Filled 2019-10-27: qty 4

## 2019-10-27 NOTE — ED Provider Notes (Signed)
[facility] Emergency Department Provider Note  ____________________________________________  Time seen: Approximately 4:12 PM  I have reviewed the triage vital signs and the nursing notes.   HISTORY  Chief Complaint Chest Pain    HPI Wesley [name] is a 79 y.o. male with a history of CAD CHF, status post CABG, GERD, hypertension  who comes the ED complaining of central chest pain radiating to the back described as heaviness that started about 11:00 AM while going to cardiology clinic to make an appointment.  No aggravating or alleviating factors.  No diaphoresis or vomiting, no palpitations dizziness or syncope.  He reports feeling short of breath while taking a shower this morning, but no chest pain at that time, and no shortness of breath currently.  Denies any recent exertional symptoms.  No leg swelling, no orthopnea, compliant with his medication, checks his daily weight which has not been rising.     Past Medical History:  Diagnosis Date  . Allergic rhinitis   . 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 CHF (congestive heart failure) ([place])   . Chronic back pain    no surgery  . Coronary atherosclerosis of autologous vein bypass graft   . Dysrhythmia    irregular but not specifically diagnosed  . Essential hypertension, benign    pt denies 04/07/14  . GERD (gastroesophageal reflux disease)   . Hyperlipidemia   . Intermittent vertigo   . Labral tear of shoulder    w/ fracture  . Macular degeneration   . OSA (obstructive sleep apnea)    NOP CPAP. sleep study 06/03/2012 - AHI 54.74/hr during total sleep, AHI 49.66/hr during REM  . Prostate cancer ([place]) 2018   radiation  . S/P CABG x 4 2005  . Stroke [facility]) 2011   damage to back of brain, difficulty when turning with ambulation  . Synovitis and tenosynovitis, unspecified   . Tubular adenoma of colon   . Unspecified vitamin D deficiency      Patient Active Problem List   Diagnosis Date  Noted  . S/P right knee arthroscopy 01/05/2019  . 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 Ganglion and cyst of synovium, tendon and bursa 05/17/2018  . CAD (coronary artery disease) 05/17/2018  . Carpal tunnel syndrome 05/17/2018  . Chronic back pain 05/17/2018  . History of stroke 05/17/2018  . Synovitis and tenosynovitis, unspecified 05/17/2018  . Vitamin D deficiency 05/17/2018  . 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 Lymphocytosis 02/21/2016  . Malignant neoplasm of prostate ([place]) 11/20/2015  . TIA 03/27/2010  . HYPERLIPIDEMIA 10/04/2009  . HYPERTENSION 10/04/2009  . 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 GERD 10/04/2009  . PYELONEPHRITIS 10/04/2009  . PSA, INCREASED 10/04/2009  . 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 HEMORRHOIDECTOMY, HX OF 10/04/2009     Past Surgical History:  Procedure Laterality Date  . CARDIAC CATHETERIZATION  02/01/2004   L main with 60% prox stenosis, LAD calcified in prox section with 95% ostial lesion, diffusion 50-60% disease in mid LAD with focal 90% mid LAD stenosis; 1st diagonal coarsely irregular with up to 30% stenosis in prox & mid segments; L Cfx with 3 OMs, AV groove Cfx with 40-50% stenosis; 3rd OM with 50% prox stenosis; RCA with PDA & PLA, PLA with 95% osital lesion (Dr. Gerrie Nordmann)  . CATARACT EXTRACTION, BILATERAL    . COLONOSCOPY    . CORONARY ARTERY BYPASS GRAFT  02/02/2004   LIMA to DX1, RIMA to RCA, SVG to  distal LAD, SVG to Cfx marginal (Dr. Remus Loffler)  . EYE SURGERY Bilateral    cataract extractions  . gold seed marker placement/prostate  02/28/2016  . [place]  2002  . JOINT REPLACEMENT Left 2014   knee  . JOINT REPLACEMENT Right 05/17/2018   knee  . KNEE ARTHROPLASTY Right 05/17/2018   Procedure: COMPUTER ASSISTED TOTAL KNEE ARTHROPLASTY;  Surgeon: Dereck Leep, MD;  Location: [facility] ORS;  Service: Orthopedics;  Laterality: Right;  . KNEE ARTHROSCOPY  01/05/2019   Procedure: ARTHROSCOPY KNEE, LYSIS OF  ADHESIONS;  Surgeon: Dereck Leep, MD;  Location: [facility] ORS;  Service: Orthopedics;;  . NM MYOCAR PERF WALL MOTION  06/2012   lexiscan myoview - normal low risk study, EF 47%  . PAROTIDECTOMY Left 08/04/2017   Procedure: SUPERFICIAL PAROTIDECTOMY;  Surgeon: Beverly Gust, MD;  Location: [facility] ORS;  Service: ENT;  Laterality: Left;  . 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 TONSILLECTOMY AND ADENOIDECTOMY    . TOTAL KNEE ARTHROPLASTY Left    Dr. Marry Guan [facility] [place])  . TRANSTHORACIC ECHOCARDIOGRAM  06/2012   EF 50-55%, mild MR; LA mild-mod dilated; RVSP increased  . UPPER GASTROINTESTINAL ENDOSCOPY       Prior to Admission medications   Medication Sig Start Date End Date Taking? Authorizing Provider  Ascorbic Acid (VITAMIN C) 1000 MG tablet Take 1,000 mg by mouth daily.     [provider]  aspirin EC 81 MG tablet Take 81 mg by mouth daily with lunch.    [provider]  aspirin-acetaminophen-caffeine (EXCEDRIN MIGRAINE) [phone] MG tablet Take 2 tablets by mouth every 6 (six) hours as needed for headache.    [provider]  atorvastatin (LIPITOR) 80 MG tablet Take 80 mg by mouth at bedtime.     [provider]  carvedilol (COREG) 6.25 MG tablet Take 6.25 mg by mouth daily.    [provider]  Coenzyme Q10 100 MG capsule Take 100 mg by mouth daily with lunch.     [provider]  CRANBERRY PO Take 4,200 mg by mouth daily with lunch.     [provider]  diphenhydramine-acetaminophen (TYLENOL PM) 25-500 MG TABS tablet Take 2 tablets by mouth at bedtime as needed (for pain).    [provider]  docusate calcium (SURFAK) 240 MG capsule Take 480 mg by mouth at bedtime.    [provider]  famotidine (PEPCID) 20 MG tablet Take 1 tablet (20 mg total) by mouth 2 (two) times daily. 10/27/19   Carrie Mew, MD  furosemide (LASIX) 40 MG tablet Take 40 mg by  mouth daily.    [provider]  HYDROcodone-acetaminophen (NORCO) 5-325 MG tablet Take 1-2 tablets by mouth every 4 (four) hours as needed for moderate pain. 01/05/19   Hooten, Laurice Record, MD  L-Lysine 500 MG CAPS Take 500 mg by mouth at bedtime.    [provider]  Lactobacillus (PROBIOTIC ACIDOPHILUS PO) Take 1 capsule by mouth daily.     [provider]  levOCARNitine L-Tartrate (L-CARNITINE) 500 MG CAPS Take 500 mg by mouth daily with lunch.     [provider]  losartan (COZAAR) 50 MG tablet Take 50 mg by mouth daily.    [provider]  LUMIGAN 0.01 % SOLN Place 1 drop into both eyes at bedtime.  09/05/13   [provider]  magnesium oxide (MAG-OX) 400 MG tablet Take 400 mg  by mouth daily.    [provider]  meloxicam (MOBIC) 15 MG tablet Take 15 mg by mouth at bedtime.  10/04/15   [provider]  Multiple Vitamins-Minerals (CENTRUM SILVER PO) Take 1 tablet by mouth daily.    [provider]  Multiple Vitamins-Minerals (ICAPS AREDS 2 PO) Take 1 capsule by mouth daily.     [provider]  Omega 3-6-9 Fatty Acids (OMEGA-3-6-9 PO) Take 1 capsule by mouth daily.    [provider]  pantoprazole (PROTONIX) 40 MG tablet Take 40 mg by mouth at bedtime.  11/05/15   [provider]  Selenium 200 MCG CAPS Take 200 mcg by mouth daily.     [provider]  spironolactone (ALDACTONE) 25 MG tablet Take 25 mg by mouth daily.    [provider]  tamsulosin (FLOMAX) 0.4 MG CAPS capsule Take 0.4 mg by mouth at bedtime.     [provider]  tetrahydrozoline (VISINE) 0.05 % ophthalmic solution Place 1 drop into both eyes daily as needed (for dry eyes).    [provider]  traMADol (ULTRAM) 50 MG tablet Take by mouth every 6 (six) hours as needed.    [provider]  vitamin B-12 (CYANOCOBALAMIN) 1000 MCG tablet Take 1,000 mcg by mouth daily.    [provider]  zinc gluconate 50 MG tablet Take 50 mg by mouth daily.     [provider]     Allergies Patient has no known allergies.   Family History  Problem Relation Age of Onset  . Heart disease Mother   . Parkinsonism Father   . Diabetes Paternal Grandfather   . Stomach cancer Cousin   . Colon cancer Neg Hx   . Esophageal cancer Neg Hx   . Rectal cancer Neg Hx   . Liver cancer Neg Hx     Social History Social History   Tobacco Use  . Smoking status: Never Smoker  . Smokeless tobacco: Never Used  Substance Use Topics  . Alcohol use: Yes    Comment: occasional beer every 3-4 week  . Drug use: No    Review of Systems  Constitutional:   No fever or chills.  ENT:   No sore throat. No rhinorrhea. Cardiovascular:   Positive as above chest pain without syncope. Respiratory:   No dyspnea or cough. Gastrointestinal:   Negative for abdominal pain, vomiting and diarrhea.  Musculoskeletal:   Negative for focal pain or swelling All other systems reviewed and are negative except as documented above in ROS and HPI.  ____________________________________________   PHYSICAL EXAM:  VITAL SIGNS: ED Triage Vitals  Enc Vitals Group     BP 10/27/19 1216 118/64     Pulse Rate 10/27/19 1216 67     Resp 10/27/19 1216 16     Temp 10/27/19 1216 98.2 F (36.8 C)     Temp Source 10/27/19 1216 Oral     SpO2 10/27/19 1216 96 %     Weight 10/27/19 1212 182 lb (82.6 kg)     Height 10/27/19 1212 5\' 6"  (1.676 m)     Head Circumference --      Peak Flow --      Pain Score 10/27/19 1212 6     Pain Loc --      Pain Edu? --      Excl. in [place]? --     Vital signs reviewed, nursing assessments reviewed.   Constitutional:   Alert and oriented. Non-toxic  appearance. Eyes:   Conjunctivae are normal. EOMI. PERRL. ENT      Head:   Normocephalic and atraumatic.      Nose:   Wearing a mask.      Mouth/Throat:   Wearing a mask.      Neck:   No meningismus. Full  ROM. Hematological/Lymphatic/Immunilogical:   No cervical lymphadenopathy. Cardiovascular:   RRR. Symmetric bilateral radial and DP pulses.  No murmurs. Cap refill less than 2 seconds in bilateral hands. Respiratory:   Normal respiratory effort without tachypnea/retractions. Breath sounds are clear and equal bilaterally. No wheezes/rales/rhonchi. Gastrointestinal:   Soft and nontender. Non distended. There is no CVA tenderness.  No rebound, rigidity, or guarding.  Musculoskeletal:   Normal range of motion in all extremities. No joint effusions.  No lower extremity tenderness.  No edema. Neurologic:   Normal speech and language.  Motor grossly intact. No acute focal neurologic deficits are appreciated.  Skin:    Skin is warm, dry and intact. No rash noted.  No petechiae, purpura, or bullae.  ____________________________________________    LABS (pertinent positives/negatives) (all labs ordered are listed, but only abnormal results are displayed) Labs Reviewed  BASIC METABOLIC PANEL - Abnormal; Notable for the following components:      Result Value   Potassium 5.5 (*)    Glucose, Bld 112 (*)    BUN 26 (*)    GFR calc non Af Amer 57 (*)    All other components within normal limits  CBC - Abnormal; Notable for the following components:   RBC 4.15 (*)    Hemoglobin 12.5 (*)    HCT 38.8 (*)    All other components within normal limits  PROTIME-INR  TROPONIN I (HIGH SENSITIVITY)  TROPONIN I (HIGH SENSITIVITY)   ____________________________________________   EKG  Interpreted by me  Date: 10/27/2019  Rate: 69  Rhythm: normal sinus rhythm  QRS Axis: normal  Intervals: normal  ST/T Wave abnormalities: normal  Conduction Disutrbances: none  Narrative Interpretation: unremarkable      ____________________________________________    RADIOLOGY  DG Chest 2 View  Result Date: 10/27/2019 CLINICAL DATA:  Chest pain EXAM: CHEST - 2 VIEW COMPARISON:  10/16/2009 FINDINGS: Status  post median sternotomy and CABG. Both lungs are clear. Disc degenerative disease of the thoracic spine. IMPRESSION: No acute abnormality of the lungs. Electronically Signed   By: Eddie Candle M.D.   On: 10/27/2019 12:44   CT Angio Chest/Abd/Pel for Dissection W and/or Wo Contrast  Result Date: 10/27/2019 CLINICAL DATA:  Chest pain radiating to the back since this morning. Shortness of breath. Patient reports marked gastroesophageal reflux this week. History of prostate cancer. EXAM: CT ANGIOGRAPHY CHEST, ABDOMEN AND PELVIS TECHNIQUE: Non-contrast CT of the chest was initially obtained. Multidetector CT imaging through the chest, abdomen and pelvis was performed using the standard protocol during bolus administration of intravenous contrast. Multiplanar reconstructed images and MIPs were obtained and reviewed to evaluate the vascular anatomy. CONTRAST:  52mL OMNIPAQUE IOHEXOL 350 MG/ML SOLN COMPARISON:  Chest CTA dated 08/06/2018. Pelvis CT dated 11/02/2015. FINDINGS: CTA CHEST FINDINGS Cardiovascular: Mildly enlarged heart with an interval decrease in size. Post CABG changes. Atheromatous calcifications, including the coronary arteries and aorta. Mildly enlarged ascending thoracic aorta, measuring 4.4 cm in maximum AP diameter on image number 42 series 5. No aortic dissection seen. No visible pulmonary arterial filling defects. Mediastinum/Nodes: No enlarged lymph nodes. The previously demonstrated 9 mm right and left lobe thyroid nodules currently measure 11 and 10  mm in maximum diameter respectively. Small hiatal hernia. Unremarkable esophagus. Lungs/Pleura: Minimal bilateral linear scarring. No pleural fluid. Musculoskeletal: Thoracic spine degenerative changes. Review of the MIP images confirms the above findings. CTA ABDOMEN AND PELVIS FINDINGS VASCULAR Aorta: Atheromatous calcifications. No aneurysm or dissection. Celiac: Atheromatous calcifications at the origin, without significant luminal stenosis. SMA:  Patent without evidence of aneurysm, dissection, vasculitis or significant stenosis. Renals: Atheromatous plaque and calcifications at the origins of both renal arteries with marked luminal narrowing. IMA: Atheromatous plaque and calcifications at the origin causing marked stenosis. Inflow: Atheromatous calcifications without significant narrowing. No aneurysm or dissection. Veins: No obvious venous abnormality within the limitations of this arterial phase study. Review of the MIP images confirms the above findings. NON-VASCULAR Hepatobiliary: Stable right lobe liver cyst. Unremarkable gallbladder. Pancreas: Unremarkable. No pancreatic ductal dilatation or surrounding inflammatory changes. Spleen: Normal in size without focal abnormality. Adrenals/Urinary Tract: Normal appearing adrenal glands bilateral renal cysts. The largest is arising from the upper pole on the left, measuring 7.7 cm. Mild dilatation of the left renal collecting system and ureter to the level of a 3 mm calculus in the distal left ureter, just proximal to the ureterovesical junction. Unremarkable right ureter and urinary bladder. Stomach/Bowel: Small hiatal hernia. Unremarkable small bowel and appendix. Mild colonic diverticulosis without evidence of diverticulitis. Lymphatic: Multiple enlarged retroperitoneal lymph nodes. The 2 largest are para-aortic nodes on the left, 1 measuring 13 mm in short axis diameter on image number 126 series 5 and the other measuring 15 mm in diameter on image number 131 series 5. Mildly enlarged, densely calcified lymph nodes in the inferior left pelvis. Reproductive: Markedly enlarged prostate gland with some protrusion into the base of the urinary bladder. Other: Small to moderate-sized bilateral inguinal hernias containing fat. Small umbilical hernia containing fat. Musculoskeletal: Extensive lumbar spine degenerative changes. No evidence of bony metastatic disease. Review of the MIP images confirms the above  findings. IMPRESSION: 1. 3 mm distal left ureteral calculus causing mild left hydronephrosis and hydroureter. 2. 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 3. No aortic dissection. 4. Retroperitoneal adenopathy, suspicious for metastatic adenopathy associated with the patient's history of prostate cancer. 5. Markedly enlarged prostate gland with some protrusion into the base of the urinary bladder. 6. Atheromatous changes causing marked stenosis of the origins of the renal arteries and inferior mesenteric artery. 7. Small hiatal hernia. 8. Mild colonic diverticulosis. Aortic Atherosclerosis (ICD10-I70.0). Electronically Signed   By: Claudie Revering M.D.   On: 10/27/2019 17:53    ____________________________________________   PROCEDURES Procedures  ____________________________________________  DIFFERENTIAL DIAGNOSIS   Nonspecific chest pain, NSTEMI, GERD.  Doubt dissection, PE, pneumothorax, pneumonia  CLINICAL IMPRESSION / ASSESSMENT AND PLAN / ED COURSE  Medications ordered in the ED: Medications  nitroGLYCERIN (NITROSTAT) SL tablet 0.4 mg (has no administration in time range)  acetaminophen (TYLENOL) tablet 1,000 mg (has no administration in time range)  aspirin chewable tablet 324 mg (324 mg Oral Given 10/27/19 1618)  iohexol (OMNIPAQUE) 350 MG/ML injection 75 mL (75 mLs Intravenous Contrast Given 10/27/19 1716)    Pertinent labs & imaging results that were available during my care of the patient were reviewed by me and considered in my medical decision making (see chart for details).  Wesley [name] was evaluated in Emergency Department on 10/27/2019 for the symptoms described in the history of present illness. He was evaluated in the context of the global COVID-19 pandemic, which necessitated consideration that the patient might  be at risk for infection with the SARS-CoV-2 virus that causes COVID-19. Institutional protocols and algorithms that pertain to  the evaluation of patients at risk for COVID-19 are in a state of rapid change based on information released by regulatory bodies including the CDC and federal and state organizations. These policies and algorithms were followed during the patient's care in the ED.   Patient presents with nonspecific chest pain.  With his age and comorbidities, will give full dose aspirin, nitroglycerin with goal of pain resolution.  Initial EKG, chest x-ray, labs are all unremarkable.  If pain resolves and delta troponin is unremarkable, can be stable for discharge home and follow-up with his cardiologist Dr. Clayborn Bigness.  If pain persists would plan to observe him overnight for further cardiac work-up and telemetry.  Clinical Course as of Oct 26 1833  Thu Oct 27, 2019  1658 Electronic medical record reviewed, transthoracic echocardiogram from last year shows aneurysmal dilatation of the proximal aorta.  I will obtain a CT angiogram of the chest abdomen and pelvis to evaluate for aortic dissection given this finding and his radiating chest pain along with subacute upper abdominal pain.   [PS]  1800 Serial troponin normal.  CT scan unremarkable except for redemonstrating proximal aortic aneurysm.   [PS]  [id number] Patient chest pain-free even without receiving nitroglycerin, Tylenol only.  Stable for discharge home to follow-up with cardiology Dr. Clayborn Bigness.  Discussed the patient's CT findings of the prostate and ureterolithiasis with him, he reports he is already under the care of a urologist whom he has seen in the last 2 weeks with prior treatment for prostate cancer.  Recommended continued follow up   [PS]    Clinical Course User Index [PS] Carrie Mew, MD     ____________________________________________   FINAL CLINICAL IMPRESSION(S) / ED DIAGNOSES    Final diagnoses:  Nonspecific chest pain  Ureterolithiasis  Thoracic aortic aneurysm without rupture [facility])     ED Discharge Orders         Ordered     famotidine (PEPCID) 20 MG tablet  2 times daily     10/27/19 1835          Portions of this note were generated with dragon dictation software. Dictation errors may occur despite best attempts at proofreading.   Carrie Mew, MD 10/27/19 1836

## 2019-10-27 NOTE — Discharge Instructions (Addendum)
Your lab tests and CT scan today were all okay.  You have an aneurysm of the thoracic aorta, which appears stable.

## 2019-10-27 NOTE — ED Notes (Addendum)
Attempted to get blood for lab work but pt states he got bloodwork and urine done this AM at [facility] internal medicine and did not want them redone

## 2019-10-27 NOTE — ED Triage Notes (Signed)
Pt states he is having chest pain that started this AM- pt states he has had bad acid reflux this week- pt states the chest pain radiates to his back- denies n/v with chest pain- pt states he was having SHOB while showering- hx of HF

## 2019-10-31 DIAGNOSIS — I712 Thoracic aortic aneurysm, without rupture, unspecified: Secondary | ICD-10-CM | POA: Insufficient documentation

## 2019-11-01 ENCOUNTER — Ambulatory Visit: Payer: Medicare Other | Admitting: Gastroenterology

## 2019-11-01 NOTE — Progress Notes (Deleted)
HPI :  79 year old Caucasian male with a history of chronic back pain, GERD, hyperlipidemia, OSA and stroke, most recent echo done for CHF shows an ejection fraction of 55% on 07/01/12 who presents to clinic today for a consult of a screening colonoscopy.    The patient previously followed in the clinic with Dr. Olevia Perches, his last colonoscopy was completed 02/07/11 with findings of a sessile polyp and recommendations were made for repeat in 5 years. He recently receieved a letter from Dr. Doyne Keel in the mail telling him it was time for a screening colonoscopy.   Patient tells me today that he was recently diagnosed with prostate cancer and started radiation on October 5, he completed this November 15 and has a follow-up with his oncologist on the 26th of this month. He tells me does see occasional bright red blood when he is wiping but blames this on hemorrhoids. He has also become somewhat constipated after radiation. He typically uses Senokot or magnesium oxide with good results. He has lost about 30 pounds in the past couple of years.   Patient denies fever, chills, fatigue, anorexia, heartburn, reflux or abdominal pain.   Colonoscopy 06/30/2016 - The perianal and digital rectal examinations were normal. - Multiple medium-mouthed diverticula were found in the left colon. - A scar was found in the distal rectum at site of prior hemorrhoid surgery, with moderately sized internal hemorrhoids. At the site of a scar was subtle nodule along a hemorrhoid which I suspect is likely benign hyperplastic changes. Biopsies were taken with a cold forceps for histology to ensure no adenoma. - Internal hemorrhoids were found during retroflexion. - The colon was tortous with some looping in the left colon. The exam was otherwise without abnormality. [place] [employer]    Past Medical History:  Diagnosis Date  . Allergic rhinitis   . 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 CHF (congestive heart  failure) ([place])   . Chronic back pain    no surgery  . Coronary atherosclerosis of autologous vein bypass graft   . Dysrhythmia    irregular but not specifically diagnosed  . Essential hypertension, benign    pt denies 04/07/14  . GERD (gastroesophageal reflux disease)   . Hyperlipidemia   . Intermittent vertigo   . Labral tear of shoulder    w/ fracture  . Macular degeneration   . OSA (obstructive sleep apnea)    NOP CPAP. sleep study 06/03/2012 - AHI 54.74/hr during total sleep, AHI 49.66/hr during REM  . Prostate cancer ([place]) 2018   radiation  . S/P CABG x 4 2005  . Stroke [facility]) 2011   damage to back of brain, difficulty when turning with ambulation  . Synovitis and tenosynovitis, unspecified   . Tubular adenoma of colon   . Unspecified vitamin D deficiency      Past Surgical History:  Procedure Laterality Date  . CARDIAC CATHETERIZATION  02/01/2004   L main with 60% prox stenosis, LAD calcified in prox section with 95% ostial lesion, diffusion 50-60% disease in mid LAD with focal 90% mid LAD stenosis; 1st diagonal coarsely irregular with up to 30% stenosis in prox & mid segments; L Cfx with 3 OMs, AV groove Cfx with 40-50% stenosis; 3rd OM with 50% prox stenosis; RCA with PDA & PLA, PLA with 95% osital lesion (Dr. Gerrie Nordmann)  . CATARACT EXTRACTION, BILATERAL    . COLONOSCOPY    . CORONARY ARTERY BYPASS GRAFT  02/02/2004   LIMA to DX1,  RIMA to RCA, SVG to distal LAD, SVG to Cfx marginal (Dr. Remus Loffler)  . EYE SURGERY Bilateral    cataract extractions  . gold seed marker placement/prostate  02/28/2016  . [place]  2002  . JOINT REPLACEMENT Left 2014   knee  . JOINT REPLACEMENT Right 05/17/2018   knee  . KNEE ARTHROPLASTY Right 05/17/2018   Procedure: COMPUTER ASSISTED TOTAL KNEE ARTHROPLASTY;  Surgeon: Dereck Leep, MD;  Location: [facility] ORS;  Service: Orthopedics;  Laterality: Right;  . KNEE ARTHROSCOPY  01/05/2019   Procedure: ARTHROSCOPY KNEE, LYSIS OF  ADHESIONS;  Surgeon: Dereck Leep, MD;  Location: [facility] ORS;  Service: Orthopedics;;  . NM MYOCAR PERF WALL MOTION  06/2012   lexiscan myoview - normal low risk study, EF 47%  . PAROTIDECTOMY Left 08/04/2017   Procedure: SUPERFICIAL PAROTIDECTOMY;  Surgeon: Beverly Gust, MD;  Location: [facility] ORS;  Service: ENT;  Laterality: Left;  . 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 TONSILLECTOMY AND ADENOIDECTOMY    . TOTAL KNEE ARTHROPLASTY Left    Dr. Marry Guan [facility] [place])  . TRANSTHORACIC ECHOCARDIOGRAM  06/2012   EF 50-55%, mild MR; LA mild-mod dilated; RVSP increased  . UPPER GASTROINTESTINAL ENDOSCOPY     Family History  Problem Relation Age of Onset  . Heart disease Mother   . Parkinsonism Father   . Diabetes Paternal Grandfather   . Stomach cancer Cousin   . Colon cancer Neg Hx   . Esophageal cancer Neg Hx   . Rectal cancer Neg Hx   . Liver cancer Neg Hx    Social History   Tobacco Use  . Smoking status: Never Smoker  . Smokeless tobacco: Never Used  Substance Use Topics  . Alcohol use: Yes    Comment: occasional beer every 3-4 week  . Drug use: No   Current Outpatient Medications  Medication Sig Dispense Refill  . Ascorbic Acid (VITAMIN C) 1000 MG tablet Take 1,000 mg by mouth daily.     Marland Kitchen aspirin EC 81 MG tablet Take 81 mg by mouth daily with lunch.    Marland Kitchen aspirin-acetaminophen-caffeine (EXCEDRIN MIGRAINE) 250-250-65 MG tablet Take 2 tablets by mouth every 6 (six) hours as needed for headache.    Marland Kitchen atorvastatin (LIPITOR) 80 MG tablet Take 80 mg by mouth at bedtime.     . carvedilol (COREG) 6.25 MG tablet Take 6.25 mg by mouth daily.    . Coenzyme Q10 100 MG capsule Take 100 mg by mouth daily with lunch.     . CRANBERRY PO Take 4,200 mg by mouth daily with lunch.     . diphenhydramine-acetaminophen (TYLENOL PM) 25-500 MG TABS tablet Take 2 tablets by mouth at bedtime as needed (for pain).    Marland Kitchen docusate calcium (SURFAK)  240 MG capsule Take 480 mg by mouth at bedtime.    . famotidine (PEPCID) 20 MG tablet Take 1 tablet (20 mg total) by mouth 2 (two) times daily. 60 tablet 0  . furosemide (LASIX) 40 MG tablet Take 40 mg by mouth daily.    Marland Kitchen HYDROcodone-acetaminophen (NORCO) 5-325 MG tablet Take 1-2 tablets by mouth every 4 (four) hours as needed for moderate pain. 20 tablet 0  . L-Lysine 500 MG CAPS Take 500 mg by mouth at bedtime.    . Lactobacillus (PROBIOTIC ACIDOPHILUS PO) Take 1 capsule by mouth daily.     Marland Kitchen levOCARNitine L-Tartrate (L-CARNITINE) 500 MG CAPS Take 500 mg  by mouth daily with lunch.     . losartan (COZAAR) 50 MG tablet Take 50 mg by mouth daily.    Marland Kitchen LUMIGAN 0.01 % SOLN Place 1 drop into both eyes at bedtime.     . magnesium oxide (MAG-OX) 400 MG tablet Take 400 mg by mouth daily.    . meloxicam (MOBIC) 15 MG tablet Take 15 mg by mouth at bedtime.   2  . Multiple Vitamins-Minerals (CENTRUM SILVER PO) Take 1 tablet by mouth daily.    . Multiple Vitamins-Minerals (ICAPS AREDS 2 PO) Take 1 capsule by mouth daily.     Ernestine Conrad 3-6-9 Fatty Acids (OMEGA-3-6-9 PO) Take 1 capsule by mouth daily.    . pantoprazole (PROTONIX) 40 MG tablet Take 40 mg by mouth at bedtime.   7  . Selenium 200 MCG CAPS Take 200 mcg by mouth daily.     Marland Kitchen spironolactone (ALDACTONE) 25 MG tablet Take 25 mg by mouth daily.    . tamsulosin (FLOMAX) 0.4 MG CAPS capsule Take 0.4 mg by mouth at bedtime.     Marland Kitchen tetrahydrozoline (VISINE) 0.05 % ophthalmic solution Place 1 drop into both eyes daily as needed (for dry eyes).    . traMADol (ULTRAM) 50 MG tablet Take by mouth every 6 (six) hours as needed.    . vitamin B-12 (CYANOCOBALAMIN) 1000 MCG tablet Take 1,000 mcg by mouth daily.    Marland Kitchen zinc gluconate 50 MG tablet Take 50 mg by mouth daily.      No current facility-administered medications for this visit.   No Known Allergies   Review of Systems: All systems reviewed and negative except where noted in HPI.    DG Chest 2  View  Result Date: 10/27/2019 CLINICAL DATA:  Chest pain EXAM: CHEST - 2 VIEW COMPARISON:  10/16/2009 FINDINGS: Status post median sternotomy and CABG. Both lungs are clear. Disc degenerative disease of the thoracic spine. IMPRESSION: No acute abnormality of the lungs. Electronically Signed   By: Eddie Candle M.D.   On: 10/27/2019 12:44   CT Angio Chest/Abd/Pel for Dissection W and/or Wo Contrast  Result Date: 10/27/2019 CLINICAL DATA:  Chest pain radiating to the back since this morning. Shortness of breath. Patient reports marked gastroesophageal reflux this week. History of prostate cancer. EXAM: CT ANGIOGRAPHY CHEST, ABDOMEN AND PELVIS TECHNIQUE: Non-contrast CT of the chest was initially obtained. Multidetector CT imaging through the chest, abdomen and pelvis was performed using the standard protocol during bolus administration of intravenous contrast. Multiplanar reconstructed images and MIPs were obtained and reviewed to evaluate the vascular anatomy. CONTRAST:  54mL OMNIPAQUE IOHEXOL 350 MG/ML SOLN COMPARISON:  Chest CTA dated 08/06/2018. Pelvis CT dated 11/02/2015. FINDINGS: CTA CHEST FINDINGS Cardiovascular: Mildly enlarged heart with an interval decrease in size. Post CABG changes. Atheromatous calcifications, including the coronary arteries and aorta. Mildly enlarged ascending thoracic aorta, measuring 4.4 cm in maximum AP diameter on image number 42 series 5. No aortic dissection seen. No visible pulmonary arterial filling defects. Mediastinum/Nodes: No enlarged lymph nodes. The previously demonstrated 9 mm right and left lobe thyroid nodules currently measure 11 and 10 mm in maximum diameter respectively. Small hiatal hernia. Unremarkable esophagus. Lungs/Pleura: Minimal bilateral linear scarring. No pleural fluid. Musculoskeletal: Thoracic spine degenerative changes. Review of the MIP images confirms the above findings. CTA ABDOMEN AND PELVIS FINDINGS VASCULAR Aorta: Atheromatous calcifications.  No aneurysm or dissection. Celiac: Atheromatous calcifications at the origin, without significant luminal stenosis. SMA: Patent without evidence of aneurysm, dissection, vasculitis or  significant stenosis. Renals: Atheromatous plaque and calcifications at the origins of both renal arteries with marked luminal narrowing. IMA: Atheromatous plaque and calcifications at the origin causing marked stenosis. Inflow: Atheromatous calcifications without significant narrowing. No aneurysm or dissection. Veins: No obvious venous abnormality within the limitations of this arterial phase study. Review of the MIP images confirms the above findings. NON-VASCULAR Hepatobiliary: Stable right lobe liver cyst. Unremarkable gallbladder. Pancreas: Unremarkable. No pancreatic ductal dilatation or surrounding inflammatory changes. Spleen: Normal in size without focal abnormality. Adrenals/Urinary Tract: Normal appearing adrenal glands bilateral renal cysts. The largest is arising from the upper pole on the left, measuring 7.7 cm. Mild dilatation of the left renal collecting system and ureter to the level of a 3 mm calculus in the distal left ureter, just proximal to the ureterovesical junction. Unremarkable right ureter and urinary bladder. Stomach/Bowel: Small hiatal hernia. Unremarkable small bowel and appendix. Mild colonic diverticulosis without evidence of diverticulitis. Lymphatic: Multiple enlarged retroperitoneal lymph nodes. The 2 largest are para-aortic nodes on the left, 1 measuring 13 mm in short axis diameter on image number 126 series 5 and the other measuring 15 mm in diameter on image number 131 series 5. Mildly enlarged, densely calcified lymph nodes in the inferior left pelvis. Reproductive: Markedly enlarged prostate gland with some protrusion into the base of the urinary bladder. Other: Small to moderate-sized bilateral inguinal hernias containing fat. Small umbilical hernia containing fat. Musculoskeletal: Extensive  lumbar spine degenerative changes. No evidence of bony metastatic disease. Review of the MIP images confirms the above findings. IMPRESSION: 1. 3 mm distal left ureteral calculus causing mild left hydronephrosis and hydroureter. 2. 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 3. No aortic dissection. 4. Retroperitoneal adenopathy, suspicious for metastatic adenopathy associated with the patient's history of prostate cancer. 5. Markedly enlarged prostate gland with some protrusion into the base of the urinary bladder. 6. Atheromatous changes causing marked stenosis of the origins of the renal arteries and inferior mesenteric artery. 7. Small hiatal hernia. 8. Mild colonic diverticulosis. Aortic Atherosclerosis (ICD10-I70.0). Electronically Signed   By: Claudie Revering M.D.   On: 10/27/2019 17:53    Physical Exam: There were no vitals taken for this visit. Constitutional: Pleasant,well-developed, ***male in no acute distress. HEENT: Normocephalic and atraumatic. Conjunctivae are normal. No scleral icterus. Neck supple.  Cardiovascular: Normal rate, regular rhythm.  Pulmonary/chest: Effort normal and breath sounds normal. No wheezing, rales or rhonchi. Abdominal: Soft, nondistended, nontender. Bowel sounds active throughout. There are no masses palpable. No hepatomegaly. Extremities: no edema Lymphadenopathy: No cervical adenopathy noted. Neurological: Alert and oriented to person place and time. Skin: Skin is warm and dry. No rashes noted. Psychiatric: Normal mood and affect. Behavior is normal.   ASSESSMENT AND PLAN:  [name], MD

## 2019-12-07 ENCOUNTER — Encounter: Payer: Self-pay | Admitting: Gastroenterology

## 2020-02-06 ENCOUNTER — Ambulatory Visit: Payer: Medicare Other | Admitting: Gastroenterology

## 2020-02-06 ENCOUNTER — Encounter: Payer: Self-pay | Admitting: Gastroenterology

## 2020-02-06 VITALS — BP 110/60 | HR 68 | Ht 66.0 in | Wt 183.0 lb

## 2020-02-06 DIAGNOSIS — I5020 Unspecified systolic (congestive) heart failure: Secondary | ICD-10-CM | POA: Diagnosis not present

## 2020-02-06 DIAGNOSIS — K5900 Constipation, unspecified: Secondary | ICD-10-CM

## 2020-02-06 DIAGNOSIS — K219 Gastro-esophageal reflux disease without esophagitis: Secondary | ICD-10-CM | POA: Diagnosis not present

## 2020-02-06 DIAGNOSIS — R1310 Dysphagia, unspecified: Secondary | ICD-10-CM | POA: Diagnosis not present

## 2020-02-06 DIAGNOSIS — K31A0 Gastric intestinal metaplasia, unspecified: Secondary | ICD-10-CM

## 2020-02-06 DIAGNOSIS — K3189 Other diseases of stomach and duodenum: Secondary | ICD-10-CM

## 2020-02-06 DIAGNOSIS — Z8719 Personal history of other diseases of the digestive system: Secondary | ICD-10-CM

## 2020-02-06 DIAGNOSIS — Z8711 Personal history of peptic ulcer disease: Secondary | ICD-10-CM

## 2020-02-06 MED ORDER — POLYETHYLENE GLYCOL 3350 17 G PO PACK: PACK | ORAL | 0 refills | Status: DC

## 2020-02-06 NOTE — Patient Instructions (Addendum)
If you are age 80 or older, your body mass index should be between 23-30. Your Body mass index is 29.54 kg/m. If this is out of the aforementioned range listed, please consider follow up with your Primary Care Provider.  If you are age 76 or younger, your body mass index should be between 19-25. Your Body mass index is 29.54 kg/m. If this is out of the aformentioned range listed, please consider follow up with your Primary Care Provider.   You have been scheduled for a Barium Esophogram at [facility] (1st floor of the hospital) on Thursday, 8-26 at 11:00am. Please arrive 15 minutes prior to your appointment for registration. Make certain not to have anything to eat or drink 3 hours prior to your test. If you need to reschedule for any reason, please contact radiology at [phone] to do so. __________________________________________________________________ A barium swallow is an examination that concentrates on views of the esophagus. This tends to be a double contrast exam (barium and two liquids which, when combined, create a gas to distend the wall of the oesophagus) or single contrast (non-ionic iodine based). The study is usually tailored to your symptoms so a good history is essential. Attention is paid during the study to the form, structure and configuration of the esophagus, looking for functional disorders (such as aspiration, dysphagia, achalasia, motility and reflux) EXAMINATION You may be asked to change into a gown, depending on the type of swallow being performed. A radiologist and radiographer will perform the procedure. The radiologist will advise you of the type of contrast selected for your procedure and direct you during the exam. You will be asked to stand, sit or lie in several different positions and to hold a small amount of fluid in your mouth before being asked to swallow while the imaging is performed .In some instances you may be asked to swallow barium coated  marshmallows to assess the motility of a solid food bolus. The exam can be recorded as a digital or video fluoroscopy procedure. POST PROCEDURE It will take 1-2 days for the barium to pass through your system. To facilitate this, it is important, unless otherwise directed, to increase your fluids for the next 24-48hrs and to resume your normal diet.  This test typically takes about 30 minutes to perform. ________________________________________________________________________   Please purchase the following medications over the counter and take as directed: Miralax: Take as directed daily. You may increase or decrease as needed   Thank you for entrusting me with your care and for choosing [facility], Dr. Simpson Cellar

## 2020-02-06 NOTE — Progress Notes (Signed)
HPI :  79 year old male with a history of CHF, OSA, stroke, prostate cancer status post XRT, last seen in our office in January 2018, here to reestablish care for bowel changes, dysphagia, reflux.  He is remotely had prostate cancer and has received therapy with radiation for this.  He underwent a colonoscopy with me for rectal bleeding in January 2018 at which point time he had multiple left-sided diverticulosis, a moderate size internal hemorrhoids thought to be the cause for his bleeding.  He had no evidence of radiation proctitis at the time.  He states for the past 1 to 2 years he has felt constipated.  States he strains to get stools out, can be thinner in caliber than usual.  He uses senna as needed which does help although he does not use it all the time.  He states symptoms started when he began using tramadol for his orthopedic pain.  He has not tried using anything else yet for his symptoms.  He states he has been on Protonix for reflux, 40 mg a day, generally that works pretty well for him.  He states he had some severe reflux about a month ago but since resolved and back to normal baseline.  He does endorse occasional dysphagia with chicken which is new for him over the past 6 months or so.  No nausea or vomiting, no abdominal pains.  He does have a history of a small gastric ulcer and gastric intestinal metaplasia noted on his last EGD in 2015.  That was noted in the setting of NSAID use and who tested negative for H. pylori.  He does use Mobic every day for orthopedic pains.  He is followed by cardiology for systolic heart failure.  His last EF was 35 to 40% in November 2020.  He was placed on Entresto and had diuretics added in recent months and states his dyspnea has completely resolved.  He denies any chest pains.  No lower extremity edema.  He is scheduled to see his cardiologist in the next few weeks.  He is not sure if they have plans for repeat echocardiogram.    Colonoscopy  06/30/2016 - The perianal and digital rectal examinations were normal. - Multiple medium-mouthed diverticula were found in the left colon. - A scar was found in the distal rectum at site of prior hemorrhoid surgery, with moderately sized internal hemorrhoids. At the site of a scar was subtle nodule along a hemorrhoid which I suspect is likely benign hyperplastic changes. Biopsies were taken with a cold forceps for histology to ensure no adenoma. - Internal hemorrhoids were found during retroflexion. - The colon was tortous with some looping in the left colon. The exam was otherwise without Abnormality.  Diagnosis Surgical [P], rectum scar - MINUTE FRAGMENT OF COLONIC EPITHELIUM WITH HYPERPLASTIC CHANGES, CONSISTENT WITH HYPERPLASTIC POLYP.  EGD 05/03/2014 - severe gastritis, 1cm gastric ulcer per Dr. Olevia Perches although photos show this to be 2-61mm - biopsies show chronic gastritis with GIM, H pylori negative        Past Medical History:  Diagnosis Date  . Allergic rhinitis   . CAD (coronary artery disease)   . 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 CHF (congestive heart failure) (HCC)    LVEF 35% BY echo 02-2019  . Chronic back pain    no surgery  . Coronary atherosclerosis of autologous vein bypass graft   . Dysrhythmia    irregular but not specifically diagnosed  . Essential hypertension, benign  pt denies 04/07/14  . GERD (gastroesophageal reflux disease)   . Hyperlipidemia   . Intermittent vertigo   . Labral tear of shoulder    w/ fracture  . Macular degeneration   . 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 OSA (obstructive sleep apnea)    NOP CPAP. sleep study 06/03/2012 - AHI 54.74/hr during total sleep, AHI 49.66/hr during REM  . Prostate cancer ([place]) 10/2015   radiation  followed by Dr. Karsten Ro  . S/P CABG x 4 2005  . Stroke [facility]) 2011   damage to back of brain, difficulty when turning with ambulation  . Synovitis and tenosynovitis, unspecified    . Thoracic aortic aneurysm without rupture ([place]) 10/2019   4.4 cm by CA 2021  . Tubular adenoma of colon   . Unspecified vitamin D deficiency      Past Surgical History:  Procedure Laterality Date  . CARDIAC CATHETERIZATION  02/01/2004   L main with 60% prox stenosis, LAD calcified in prox section with 95% ostial lesion, diffusion 50-60% disease in mid LAD with focal 90% mid LAD stenosis; 1st diagonal coarsely irregular with up to 30% stenosis in prox & mid segments; L Cfx with 3 OMs, AV groove Cfx with 40-50% stenosis; 3rd OM with 50% prox stenosis; RCA with PDA & PLA, PLA with 95% osital lesion (Dr. Gerrie Nordmann)  . CARPAL TUNNEL RELEASE    . CATARACT EXTRACTION, BILATERAL    . COLONOSCOPY    . CORONARY ARTERY BYPASS GRAFT  02/02/2004   LIMA to DX1, RIMA to RCA, SVG to distal LAD, SVG to Cfx marginal (Dr. Remus Loffler)  . EYE SURGERY Bilateral    cataract extractions  . gold seed marker placement/prostate  02/28/2016  . [place]  2002  . JOINT REPLACEMENT Left 2014   knee  . JOINT REPLACEMENT Right 05/17/2018   knee  . KNEE ARTHROPLASTY Right 05/17/2018   Procedure: COMPUTER ASSISTED TOTAL KNEE ARTHROPLASTY;  Surgeon: Dereck Leep, MD;  Location: [facility] ORS;  Service: Orthopedics;  Laterality: Right;  . KNEE ARTHROSCOPY  01/05/2019   Procedure: ARTHROSCOPY KNEE, LYSIS OF ADHESIONS;  Surgeon: Dereck Leep, MD;  Location: [facility] ORS;  Service: Orthopedics;;  . NM MYOCAR PERF WALL MOTION  06/2012   lexiscan myoview - normal low risk study, EF 47%  . PAROTIDECTOMY Left 08/04/2017   Procedure: SUPERFICIAL PAROTIDECTOMY;  Surgeon: Beverly Gust, MD;  Location: [facility] ORS;  Service: ENT;  Laterality: Left;  . 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 TONSILLECTOMY AND ADENOIDECTOMY    . TOTAL KNEE ARTHROPLASTY Left    Dr. Marry Guan [facility], [place])  . TRANSTHORACIC ECHOCARDIOGRAM  06/2012   EF 50-55%, mild MR; LA mild-mod dilated; RVSP  increased  . UPPER GASTROINTESTINAL ENDOSCOPY     Family History  Problem Relation Age of Onset  . Heart disease Mother   . Parkinsonism Father   . Alzheimer's disease Father   . Diabetes Paternal Grandfather   . Stomach cancer Cousin   . Colon cancer Neg Hx   . Esophageal cancer Neg Hx   . Rectal cancer Neg Hx   . Liver cancer Neg Hx    Social History   Tobacco Use  . Smoking status: Never Smoker  . Smokeless tobacco: Never Used  Vaping Use  . Vaping Use: Never used  Substance Use Topics  . Alcohol use: Yes    Comment: occasional beer  every 3-4 week  . Drug use: No   Current Outpatient Medications  Medication Sig Dispense Refill  . Ascorbic Acid (VITAMIN C) 1000 MG tablet Take 1,000 mg by mouth daily.     Marland Kitchen aspirin EC 81 MG tablet Take 81 mg by mouth daily with lunch.    Marland Kitchen aspirin-acetaminophen-caffeine (EXCEDRIN MIGRAINE) 250-250-65 MG tablet Take 2 tablets by mouth every 6 (six) hours as needed for headache.    Marland Kitchen atorvastatin (LIPITOR) 80 MG tablet Take 80 mg by mouth at bedtime.     . carvedilol (COREG) 6.25 MG tablet Take 6.25 mg by mouth daily.    . Coenzyme Q10 100 MG capsule Take 100 mg by mouth daily with lunch.     . CRANBERRY PO Take 4,200 mg by mouth daily with lunch.     . diphenhydramine-acetaminophen (TYLENOL PM) 25-500 MG TABS tablet Take 2 tablets by mouth at bedtime as needed (for pain).    Marland Kitchen docusate calcium (SURFAK) 240 MG capsule Take 480 mg by mouth at bedtime.    . furosemide (LASIX) 40 MG tablet Take 40 mg by mouth daily.    Marland Kitchen HYDROcodone-acetaminophen (NORCO) 5-325 MG tablet Take 1-2 tablets by mouth every 4 (four) hours as needed for moderate pain. 20 tablet 0  . L-Lysine 500 MG CAPS Take 500 mg by mouth at bedtime.    . Lactobacillus (PROBIOTIC ACIDOPHILUS PO) Take 1 capsule by mouth daily.     Marland Kitchen levOCARNitine L-Tartrate (L-CARNITINE) 500 MG CAPS Take 500 mg by mouth daily with lunch.     . losartan (COZAAR) 50 MG tablet Take 50 mg by mouth daily.     Marland Kitchen LUMIGAN 0.01 % SOLN Place 1 drop into both eyes at bedtime.     . magnesium oxide (MAG-OX) 400 MG tablet Take 400 mg by mouth daily.    . meloxicam (MOBIC) 15 MG tablet Take 15 mg by mouth as needed.  2  . Multiple Vitamins-Minerals (CENTRUM SILVER PO) Take 1 tablet by mouth daily.    . Multiple Vitamins-Minerals (ICAPS AREDS 2 PO) Take 1 capsule by mouth daily.     Ernestine Conrad 3-6-9 Fatty Acids (OMEGA-3-6-9 PO) Take 1 capsule by mouth daily.    . pantoprazole (PROTONIX) 40 MG tablet Take 40 mg by mouth at bedtime.   7  . sacubitril-valsartan (ENTRESTO) 24-26 MG Take 1 tablet by mouth 2 (two) times daily.    . Selenium 200 MCG CAPS Take 200 mcg by mouth daily.     Marland Kitchen spironolactone (ALDACTONE) 25 MG tablet Take 25 mg by mouth daily.    . tamsulosin (FLOMAX) 0.4 MG CAPS capsule Take 0.4 mg by mouth at bedtime.     Marland Kitchen tetrahydrozoline (VISINE) 0.05 % ophthalmic solution Place 1 drop into both eyes daily as needed (for dry eyes).    . traMADol (ULTRAM) 50 MG tablet Take by mouth every 6 (six) hours as needed.    . vitamin B-12 (CYANOCOBALAMIN) 1000 MCG tablet Take 1,000 mcg by mouth daily.    Marland Kitchen zinc gluconate 50 MG tablet Take 50 mg by mouth daily.     . polyethylene glycol (MIRALAX) 17 g packet Take as directed, daily. Titrate as needed 14 each 0   No current facility-administered medications for this visit.   No Known Allergies   Review of Systems: All systems reviewed and negative except where noted in HPI.   Lab Results  Component Value Date   CREATININE 1.21 10/27/2019   BUN 26 (H)  10/27/2019   NA 140 10/27/2019   K 5.5 (H) 10/27/2019   CL 109 10/27/2019   CO2 25 10/27/2019    Lab Results  Component Value Date   ALT 25 05/05/2018   AST 26 05/05/2018   ALKPHOS 73 05/05/2018   BILITOT 0.7 05/05/2018    Lab Results  Component Value Date   WBC 10.1 10/27/2019   HGB 12.5 (L) 10/27/2019   HCT 38.8 (L) 10/27/2019   MCV 93.5 10/27/2019   PLT 238 10/27/2019     Physical  Exam: BP 110/60   Pulse 68   Ht 5\' 6"  (1.676 m)   Wt 183 lb (83 kg)   BMI 29.54 kg/m  Constitutional: Pleasant,well-developed, male in no acute distress. HEENT: Normocephalic and atraumatic. Conjunctivae are normal. No scleral icterus. Neck supple.  Cardiovascular: Normal rate, regular rhythm.  Pulmonary/chest: Effort normal and breath sounds normal. No wheezing, rales or rhonchi. Abdominal: Soft, nondistended, nontender. There are no masses palpable. No hepatomegaly. DRE - no mass lesions, no stricture / stenosis, normal Extremities: no edema Lymphadenopathy: No cervical adenopathy noted. Neurological: Alert and oriented to person place and time. Skin: Skin is warm and dry. No rashes noted. Psychiatric: Normal mood and affect. Behavior is normal.   ASSESSMENT AND PLAN: 79 year old male here for assessment following  Dysphagia / GERD / CHF - reflux well controlled with once daily dosing of Protonix, he had no evidence of Barrett's esophagus on last EGD.  He has developed intermittent solid food dysphagia.  We discussed differential for this, peptic stricture certainly possible but want to rule out mass lesion etc.  I discussed options to evaluate this, namely EGD and barium swallow.  I do think EGD would be useful to evaluate and treat with dilation, however given his history of CHF, his ejection fraction is borderline acceptable to be done at the [facility].  He is now on Entresto and diuretics and doing much better symptomatically, due to see his cardiologist in a few weeks.  We will await his follow-up with cardiology and see if there plan on repeating an echocardiogram, if his EF is improved we can safely proceed at the [facility].  If not then if he needs endoscopy would pursue this to be done at the hospital although I will have his case reviewed by our anesthesia staff to discuss his candidacy for procedures at the [facility].  In the interim, he wanted to proceed with a barium swallow with tablet to  ensure nothing concerning while he is waiting his cardiac evaluation.  We will let him know the results of that with further recommendations.  Otherwise continue present dosing of PPI given history of ulcers as below  History of gastric ulcer / GIM - review of chart shows a 1 cm reported ulcer in the gastric antrum in 2015.  Normally an ulcer of the size would be followed up endoscopically, however I reviewed the photos and the ulcer appears to be only a few millimeters in size and was clean based.  He does take NSAIDs routinely and recommend long-term that is not a good option for him in light of his gastric ulcer history and cardiovascular history, recommend he not use this routinely and only PRN.  If he does need to take NSAIDs I would recommend he continue Protonix for continued prophylaxis at this time.  We otherwise discussed his history of GIM, theoretical increased risk for gastric cancer, he does not have any strong family history of gastric cancer.  If we  pursue EGD for him in regards to his dysphagia as above, we will reassess for mucosal healing and for GIM at that exam.  He agreed.  Constipation - colonoscopy 2018 without any evidence of radiation proctitis or concerning pathology.  His DRE is normal on exam today.  Time course seems to correlate with using tramadol which can cause constipation.  Recommend trial of MiraLAX to use daily and titrate up or down as needed.  If symptoms persist despite MiraLAX will consider other alternatives and consider flex sig to further evaluate, ensure no concerning pathology that has developed from radiation therapy.  He agreed  Tremont Cellar, MD [facility]  CC: [name], MD

## 2020-02-16 ENCOUNTER — Other Ambulatory Visit: Payer: Self-pay

## 2020-02-16 ENCOUNTER — Ambulatory Visit (HOSPITAL_COMMUNITY)
Admission: RE | Admit: 2020-02-16 | Discharge: 2020-02-16 | Disposition: A | Discharge status: Home / Self Care | Payer: Medicare Other | Source: Ambulatory Visit | Attending: Gastroenterology | Admitting: Gastroenterology

## 2020-02-16 DIAGNOSIS — R1310 Dysphagia, unspecified: Secondary | ICD-10-CM | POA: Diagnosis present

## 2020-02-16 DIAGNOSIS — K219 Gastro-esophageal reflux disease without esophagitis: Secondary | ICD-10-CM | POA: Insufficient documentation

## 2020-02-16 DIAGNOSIS — Z8719 Personal history of other diseases of the digestive system: Secondary | ICD-10-CM | POA: Insufficient documentation

## 2020-02-16 DIAGNOSIS — K3189 Other diseases of stomach and duodenum: Secondary | ICD-10-CM | POA: Insufficient documentation

## 2020-02-16 DIAGNOSIS — I5020 Unspecified systolic (congestive) heart failure: Secondary | ICD-10-CM | POA: Diagnosis present

## 2020-02-16 DIAGNOSIS — K5900 Constipation, unspecified: Secondary | ICD-10-CM | POA: Diagnosis present

## 2020-02-16 DIAGNOSIS — K31A0 Gastric intestinal metaplasia, unspecified: Secondary | ICD-10-CM

## 2020-02-16 DIAGNOSIS — Z8711 Personal history of peptic ulcer disease: Secondary | ICD-10-CM

## 2020-02-17 ENCOUNTER — Other Ambulatory Visit: Payer: Self-pay

## 2020-02-17 DIAGNOSIS — R1310 Dysphagia, unspecified: Secondary | ICD-10-CM

## 2020-02-17 MED ORDER — PANTOPRAZOLE SODIUM 40 MG PO TBEC: 40.0000 mg | DELAYED_RELEASE_TABLET | Freq: Two times a day (BID) | ORAL | 7 refills | Status: DC

## 2020-02-22 ENCOUNTER — Other Ambulatory Visit (HOSPITAL_COMMUNITY): Payer: Self-pay

## 2020-02-22 DIAGNOSIS — R1310 Dysphagia, unspecified: Secondary | ICD-10-CM

## 2020-02-28 ENCOUNTER — Telehealth: Payer: Self-pay

## 2020-02-28 NOTE — Telephone Encounter (Signed)
Called and scheduled pt for EGD in the [facility] on 10-29. See results note from 02-16-20, Barium Swallow.

## 2020-02-29 ENCOUNTER — Other Ambulatory Visit: Payer: Self-pay

## 2020-02-29 ENCOUNTER — Ambulatory Visit (HOSPITAL_COMMUNITY)
Admission: RE | Admit: 2020-02-29 | Discharge: 2020-02-29 | Disposition: A | Discharge status: Home / Self Care | Payer: Medicare Other | Source: Ambulatory Visit | Attending: Gastroenterology | Admitting: Gastroenterology

## 2020-02-29 DIAGNOSIS — R1310 Dysphagia, unspecified: Secondary | ICD-10-CM

## 2020-02-29 NOTE — Progress Notes (Signed)
Modified Barium Swallow Progress Note  Patient Details  Name: Wesley [name] MRN: [id number] Date of Birth: 1941-06-02  Today's Date: 02/29/2020  Modified Barium Swallow completed.  Full report located under Chart Review in the Imaging Section.  Brief recommendations include the following:  Clinical Impression  Pt demonstrates decreased laryngeal closure during swallow due to limited epiglottic deflection in the absence of other identifiable weakness. Pt has a timely swallow, but has insatnces of trace penetration during the swallow due to lack of epiglottic defection to fully protect the airway. There is a small bony protrusion at C3/4 right at the leve of the epglottis, but it may not be cause of decreased deflection as there are instances of minimal movement even without obstruction by the pharyngeal wall. When a chin tuck was trialed pt had trace silent aspiration. The best strategy was a breath hold during the swallow to incrase laryngeal closure. Pt was able to carry this out with single sips over several trials with video feedback. Recommend pt continue a regular diet and thin liquids with increased awareness for a breath hold strategy, avoiding straws and consecutive sips. Pt would benefit from f/u with a neurologist. No SLP f/u needed as education was complete during the study.    Swallow Evaluation Recommendations       SLP Diet Recommendations: Regular solids;Thin liquid   Liquid Administration via: Cup;No straw   Medication Administration: Whole meds with puree   Supervision: Patient able to self feed   Compensations: Slow rate;Small sips/bites (hold breath, then swallow)   Postural Changes: Seated upright at 90 degrees   Oral Care Recommendations: Oral care BID       Wesley [place], MA [place] Pager [phone] Office [phone]  Setsuko Robins, Katherene Ponto 02/29/2020,2:59 PM

## 2020-04-11 ENCOUNTER — Ambulatory Visit (AMBULATORY_SURGERY_CENTER): Payer: Self-pay

## 2020-04-11 ENCOUNTER — Other Ambulatory Visit: Payer: Self-pay

## 2020-04-11 VITALS — Ht 66.0 in | Wt 185.0 lb

## 2020-04-11 DIAGNOSIS — R1310 Dysphagia, unspecified: Secondary | ICD-10-CM

## 2020-04-11 NOTE — Progress Notes (Signed)
No egg or soy allergy known to patient  No issues with past sedation with any surgeries or procedures No intubation problems in the past  No FH of Malignant Hyperthermia No diet pills per patient No home 02 use per patient  No blood thinners per patient  Pt denies issues with constipation  No A fib or A flutter  EGD pamphlet given to patient at pre visit COVID 19 guidelines implemented in PV today with Pt and RN  COVID vaccines completed on 07/2019 per pt;  Due to the COVID-19 pandemic we are asking patients to follow these guidelines. Please only bring one care partner. Please be aware that your care partner may wait in the car in the parking lot or if they feel like they will be too hot to wait in the car, they may wait in the lobby on the 4th floor. All care partners are required to wear a mask the entire time (we do not have any that we can provide them), they need to practice social distancing, and we will do a Covid check for all patient's and care partners when you arrive. Also we will check their temperature and your temperature. If the care partner waits in their car they need to stay in the parking lot the entire time and we will call them on their cell phone when the patient is ready for discharge so they can bring the car to the front of the building. Also all patient's will need to wear a mask into building.

## 2020-04-13 ENCOUNTER — Encounter: Payer: Self-pay | Admitting: Gastroenterology

## 2020-04-16 DIAGNOSIS — G20 Parkinson's disease: Secondary | ICD-10-CM | POA: Insufficient documentation

## 2020-04-20 ENCOUNTER — Encounter: Payer: Medicare Other | Admitting: Gastroenterology

## 2020-05-03 ENCOUNTER — Ambulatory Visit (AMBULATORY_SURGERY_CENTER): Payer: Medicare Other | Admitting: Gastroenterology

## 2020-05-03 ENCOUNTER — Other Ambulatory Visit: Payer: Self-pay

## 2020-05-03 ENCOUNTER — Encounter: Payer: Self-pay | Admitting: Gastroenterology

## 2020-05-03 VITALS — BP 118/63 | HR 73 | Temp 97.8°F | Resp 13 | Ht 66.0 in | Wt 185.0 lb

## 2020-05-03 DIAGNOSIS — K2950 Unspecified chronic gastritis without bleeding: Secondary | ICD-10-CM | POA: Diagnosis not present

## 2020-05-03 DIAGNOSIS — K3189 Other diseases of stomach and duodenum: Secondary | ICD-10-CM

## 2020-05-03 DIAGNOSIS — R1310 Dysphagia, unspecified: Secondary | ICD-10-CM

## 2020-05-03 DIAGNOSIS — K2970 Gastritis, unspecified, without bleeding: Secondary | ICD-10-CM | POA: Diagnosis not present

## 2020-05-03 DIAGNOSIS — K449 Diaphragmatic hernia without obstruction or gangrene: Secondary | ICD-10-CM

## 2020-05-03 DIAGNOSIS — K5989 Other specified functional intestinal disorders: Secondary | ICD-10-CM | POA: Diagnosis not present

## 2020-05-03 DIAGNOSIS — R933 Abnormal findings on diagnostic imaging of other parts of digestive tract: Secondary | ICD-10-CM

## 2020-05-03 DIAGNOSIS — K222 Esophageal obstruction: Secondary | ICD-10-CM | POA: Diagnosis not present

## 2020-05-03 HISTORY — PX: UPPER GASTROINTESTINAL ENDOSCOPY: SHX188

## 2020-05-03 MED ORDER — SODIUM CHLORIDE 0.9 % IV SOLN: 500.0000 mL | Freq: Once | INTRAVENOUS | Status: DC

## 2020-05-03 NOTE — Progress Notes (Signed)
Pt's states no medical or surgical changes since previsit or office visit.  Vitals SP

## 2020-05-03 NOTE — Patient Instructions (Signed)
Read all of the handouts given to you by your recovery room nurse.  Please, abide by the food restrictions today.   Thank-you for choosing [place] for your healthcare needs today.  YOU HAD AN ENDOSCOPIC PROCEDURE TODAY AT [facility]:   Refer to the procedure report that was given to you for any specific questions about what was found during the examination.  If the procedure report does not answer your questions, please call your gastroenterologist to clarify.  If you requested that your care partner not be given the details of your procedure findings, then the procedure report has been included in a sealed envelope for you to review at your convenience later.  YOU SHOULD EXPECT: Some feelings of bloating in the abdomen. Passage of more gas than usual.  Walking can help get rid of the air that was put into your GI tract during the procedure and reduce the bloating.   Please Note:  You might notice some irritation and congestion in your nose or some drainage.  This is from the oxygen used during your procedure.  There is no need for concern and it should clear up in a day or so.  SYMPTOMS TO REPORT IMMEDIATELY:   Following upper endoscopy (EGD)  Vomiting of blood or coffee ground material  New chest pain or pain under the shoulder blades  Painful or persistently difficult swallowing  New shortness of breath  Fever of 100F or higher  Black, tarry-looking stools  For urgent or emergent issues, a gastroenterologist can be reached at any hour by calling [phone]. Do not use MyChart messaging for urgent concerns.    DIET:  We do recommend nothing by mouth until 3:45pm.  Clear liquids taken until 4:45pm. You may have a soft diet for the rest of today.  You may have a regular diet tomorrow.  ACTIVITY:  You should plan to take it easy for the rest of today and you should NOT DRIVE or use heavy machinery until tomorrow (because of the sedation medicines used during the test).     FOLLOW UP: Our staff will call the number listed on your records 48-72 hours following your procedure to check on you and address any questions or concerns that you may have regarding the information given to you following your procedure. If we do not reach you, we will leave a message.  We will attempt to reach you two times.  During this call, we will ask if you have developed any symptoms of COVID 19. If you develop any symptoms (ie: fever, flu-like symptoms, shortness of breath, cough etc.) before then, please call [phone].  If you test positive for Covid 19 in the 2 weeks post procedure, please call and report this information to [place].    If any biopsies were taken you will be contacted by phone or by letter within the next 1-3 weeks.  Please call us at [phone] if you have not heard about the biopsies in 3 weeks.    SIGNATURES/CONFIDENTIALITY: You and/or your care partner have signed paperwork which will be entered into your electronic medical record.  These signatures attest to the fact that that the information above on your After Visit Summary has been reviewed and is understood.  Full responsibility of the confidentiality of this discharge information lies with you and/or your care-partner.

## 2020-05-03 NOTE — Progress Notes (Signed)
Pt Drowsy. VSS. To PACU, report to RN. No anesthetic complications noted.  

## 2020-05-03 NOTE — Op Note (Signed)
[facility] Patient Name: Wesley [name] Procedure Date: 05/03/2020 2:00 PM MRN: [id number] Endoscopist: Remo Lipps P. 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 The patient's tolerance of                            previous anesthesia was also reviewed. The risks                            and benefits of the procedure and the sedation                            options and risks were discussed with the patient.                            All questions were answered, and informed consent                            was obtained. Prior Anticoagulants: The patient has                            taken no previous anticoagulant or antiplatelet                            agents. ASA Grade Assessment: III - A patient with                            severe systemic disease. After reviewing the risks                            and benefits, the patient was deemed in  satisfactory condition to undergo the procedure.                           After obtaining informed consent, the endoscope was                            passed under direct vision. Throughout the                            procedure, the patient's blood pressure, pulse, and                            oxygen saturations were monitored continuously. The                            Endoscope was  introduced through the mouth, and                            advanced to the second part of duodenum. The upper                            GI endoscopy was accomplished without difficulty.                            The patient tolerated the procedure well. Scope In: Scope Out: Findings:                 Esophagogastric landmarks were identified: the                            Z-line was found at 38 cm, the gastroesophageal                            junction was found at 38 cm and the upper extent of                            the gastric folds was found at 40 cm from the                            incisors.                           A 2 cm hiatal hernia was present.                           Mild esophagitis was found 38 cm from the incisors                            at the GEJ.                           One benign-appearing, intrinsic mild stenosis was                            found 38 cm  from the incisors. This stenosis                            measured less than one cm (in length). There was                            some nodular tissue at the site, suspect benign                            inflammatory tissue. The stenosis was traversed. A                            TTS dilator was passed through the scope. Dilation                            with an 18-19-20 mm balloon dilator was performed                            to 18 mm, 19 mm and then 20 mm. Biopsies were taken                            with a cold forceps for histology.                           The exam of the esophagus was otherwise normal.                           A single 3 mm sessile polyp was found in the                            gastric fundus. The polyp was removed with a cold                            biopsy forceps. Resection and retrieval were                            complete.                           A deformity was found at the pylorus (lumen of                            pyloric channel appeared  scarred but widely patent).                           The exam of the stomach was otherwise normal.                           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                           An acquired benign-appearing, intrinsic mild                            stenosis was found in the duodenal bulb. Biopsies                            were taken with a cold forceps for histology.                           The exam of the duodenum was otherwise normal. Complications:            No immediate complications. Estimated blood loss:                            Minimal. Estimated Blood Loss:     Estimated blood loss was minimal. Impression:               - Esophagogastric landmarks identified.                           - 2 cm hiatal hernia.                           - Mild esophagitis at the GEJ.                           - Benign-appearing esophageal stenosis. Dilated.                            Biopsied.                           - Normal esophagus otherwise                           - A single gastric polyp. Resected and retrieved.                           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 Biopsied. Recommendation:           - Patient has a contact number available for                            emergencies. The signs and symptoms of potential                            delayed complications were discussed with the                             patient. Return to normal activities tomorrow.                            Written discharge instructions were provided to the                            patient.                           - Post dilation diet.                           - Continue present medications.                           - Increase protonix to twice daily for the next                            month given mild esophagitis noted on this exam                           - Await pathology results and course post dilation Witten Certain P. Analys Ryden, MD 05/03/2020 2:41:23 PM This report has been signed electronically.

## 2020-05-03 NOTE — Progress Notes (Signed)
Called to room to assist during endoscopic procedure.  Patient ID and intended procedure confirmed with present staff. Received instructions for my participation in the procedure from the performing physician.  

## 2020-05-03 NOTE — Progress Notes (Signed)
Lidocaine 100mg  iv given to blunt gag reflex

## 2020-05-03 NOTE — Progress Notes (Signed)
Patient's care partner had a lot of questions regarding what we did for the  patient.  I did explain everything to her, but she kept asking the same questions.  The care partner gave good feedback after 4 attempts for RN to give a thorough report.

## 2020-05-07 ENCOUNTER — Telehealth: Payer: Self-pay

## 2020-05-07 NOTE — Telephone Encounter (Signed)
°  Follow up Call-  Call back number 05/03/2020  Post procedure Call Back phone  # [phone]  Permission to leave phone message Yes  Some recent data might be hidden     Patient questions:  Do you have a fever, pain , or abdominal swelling? No. Pain Score  0 *  Have you tolerated food without any problems? Yes.    Have you been able to return to your normal activities? Yes.    Do you have any questions about your discharge instructions: Diet   No. Medications  No. Follow up visit  No.  Do you have questions or concerns about your Care? No.  Actions: * If pain score is 4 or above: No action needed, pain <4.  1. Have you developed a fever since your procedure? no  2.   Have you had an respiratory symptoms (SOB or cough) since your procedure? no  3.   Have you tested positive for COVID 19 since your procedure no  4.   Have you had any family members/close contacts diagnosed with the COVID 19 since your procedure?  no   If yes to any of these questions please route to Joylene John, RN and Joella Prince, RN

## 2020-05-10 ENCOUNTER — Encounter: Payer: Self-pay | Admitting: Gastroenterology

## 2020-08-16 ENCOUNTER — Other Ambulatory Visit: Payer: Self-pay

## 2020-08-16 ENCOUNTER — Encounter: Payer: Self-pay | Admitting: Student in an Organized Health Care Education/Training Program

## 2020-08-16 ENCOUNTER — Ambulatory Visit
Payer: Medicare Other | Attending: Student in an Organized Health Care Education/Training Program | Admitting: Student in an Organized Health Care Education/Training Program

## 2020-08-16 VITALS — BP 112/62 | HR 76 | Temp 97.1°F | Resp 16 | Ht 67.0 in | Wt 185.0 lb

## 2020-08-16 DIAGNOSIS — M542 Cervicalgia: Secondary | ICD-10-CM | POA: Diagnosis not present

## 2020-08-16 DIAGNOSIS — M1712 Unilateral primary osteoarthritis, left knee: Secondary | ICD-10-CM | POA: Diagnosis present

## 2020-08-16 DIAGNOSIS — M545 Low back pain, unspecified: Secondary | ICD-10-CM | POA: Diagnosis not present

## 2020-08-16 DIAGNOSIS — M25511 Pain in right shoulder: Secondary | ICD-10-CM | POA: Insufficient documentation

## 2020-08-16 DIAGNOSIS — Z96653 Presence of artificial knee joint, bilateral: Secondary | ICD-10-CM | POA: Insufficient documentation

## 2020-08-16 DIAGNOSIS — M1711 Unilateral primary osteoarthritis, right knee: Secondary | ICD-10-CM | POA: Insufficient documentation

## 2020-08-16 DIAGNOSIS — G8929 Other chronic pain: Secondary | ICD-10-CM | POA: Insufficient documentation

## 2020-08-16 DIAGNOSIS — M25552 Pain in left hip: Secondary | ICD-10-CM | POA: Insufficient documentation

## 2020-08-16 DIAGNOSIS — M25551 Pain in right hip: Secondary | ICD-10-CM | POA: Insufficient documentation

## 2020-08-16 DIAGNOSIS — M533 Sacrococcygeal disorders, not elsewhere classified: Secondary | ICD-10-CM | POA: Diagnosis present

## 2020-08-16 DIAGNOSIS — M25512 Pain in left shoulder: Secondary | ICD-10-CM | POA: Diagnosis present

## 2020-08-16 DIAGNOSIS — G894 Chronic pain syndrome: Secondary | ICD-10-CM | POA: Insufficient documentation

## 2020-08-16 MED ORDER — GABAPENTIN 100 MG PO CAPS
100.0000 mg | ORAL_CAPSULE | Freq: Every day | ORAL | 0 refills | Status: DC
Start: 2020-08-16 — End: 2021-07-05

## 2020-08-16 NOTE — Progress Notes (Signed)
Patient: Wesley [name]  Service Category: E/M  Provider: Gillis Santa, MD  DOB: 16-Jan-1941  DOS: 08/16/2020  Referring Provider: [name], MD  MRN: [id number]  Setting: Ambulatory outpatient  PCP: [name], MD  Type: New Patient  Specialty: Interventional Pain Management    Location: Office  Delivery: Face-to-face     Primary Reason(s) for Visit: Encounter for initial evaluation of one or more chronic problems (new to examiner) potentially causing chronic pain, and posing a threat to normal musculoskeletal function. (Level of risk: High) CC: Hand Pain (bilateral), Back Pain (lower), Shoulder Pain (right), and Knee Pain (bilateral)  HPI  Wesley [name] is a 80 y.o. year old, male patient, who comes for the first time to our practice referred by [name], MD for our initial evaluation of his chronic pain. He has HYPERLIPIDEMIA; HYPERTENSION; S/P CABG x 4; TIA; GERD; PYELONEPHRITIS; PSA, INCREASED; ADENOCARCINOMA, BONE, JAW, LOWER, HX OF; HEMORRHOIDECTOMY, HX OF; Malignant neoplasm of prostate ([place]); Lymphocytosis; Ganglion and cyst of synovium, tendon and bursa; CAD (coronary artery disease); Carpal tunnel syndrome; Chronic back pain; History of stroke; Synovitis and tenosynovitis, unspecified; Vitamin D deficiency; History of bilateral knee replacement; Multinodular goiter; S/P right knee arthroscopy; Cervicalgia; Chronic pain of both shoulders; Chronic bilateral low back pain without sciatica; Primary osteoarthritis of left knee; Primary osteoarthritis of right knee; and Chronic pain syndrome on their problem list. Today he comes in for evaluation of his Hand Pain (bilateral), Back Pain (lower), Shoulder Pain (right), and Knee Pain (bilateral)  Pain Assessment: Location: Lower Back Radiating:  no Onset: More than a month ago Duration: Chronic pain Quality: Aching Severity: 5 /10 (subjective, self-reported pain score)  Effect on ADL:   Timing: Constant Modifying  factors: Tramadol BP: 112/62  HR: 76  Onset and Duration: Present longer than 3 months Cause of pain: Unknown Severity: Getting worse, NAS-11 at its worse: 6/10, NAS-11 at its best: 4/10, NAS-11 now: 5/10 and NAS-11 on the average: 5/10 Timing: Not influenced by the time of the day Aggravating Factors: Bending, Kneeling, Twisting and Walking uphill Alleviating Factors: Medications, Sitting and Chiropractic manipulations Associated Problems: Numbness and Tingling Quality of Pain: Aching and Dull Previous Examinations or Tests: Biopsy, MRI scan, X-rays, Nerve conduction test, Neurological evaluation and Orthopedic evaluation Previous Treatments: Chiropractic manipulations and Physical Therapy  Wesley [name] is a very pleasant 80 year old male, practicing trial attorney, who presents with a chief complaint of polyarthralgias and polymyalgias most pronounced in bilateral hands, lower back, right shoulder as well as bilateral knees.  Patient has a history of bilateral knee replacement.  He states that the majority of his pain is in his low back that is worse with prolonged standing and exertion.  He states that he used to run long distance in the past however is no longer able to do so.  He has done physical therapy in the past.  He takes tramadol 50 mg 3 times a day which he states provides some pain relief.  He denies being on gabapentin or Lyrica.  He wants to avoid opioid medications or anything else that can be potentially sedating resulting cognitive impairment.  He denies any bowel or bladder dysfunction.   Meds   Current Outpatient Medications:  .  acetaminophen (TYLENOL) 325 MG tablet, Take 650 mg by mouth every 6 (six) hours as needed., Disp: , Rfl:  .  Ascorbic Acid (VITAMIN C) 1000 MG tablet, Take 1,000 mg by mouth daily. , Disp: , Rfl:  .  aspirin EC 81 MG tablet, Take 81 mg by mouth daily with lunch., Disp: , Rfl:  .  atorvastatin (LIPITOR) 80 MG tablet, Take 80 mg by mouth at  bedtime., Disp: , Rfl:  .  carvedilol (COREG) 6.25 MG tablet, Take 6.25 mg by mouth 2 (two) times daily with a meal. , Disp: , Rfl:  .  Coenzyme Q10 100 MG capsule, Take 200 mg by mouth daily with lunch., Disp: , Rfl:  .  CRANBERRY PO, Take by mouth daily with lunch. , Disp: , Rfl:  .  diphenhydrAMINE (BENADRYL) 25 MG tablet, Take 25 mg by mouth at bedtime as needed., Disp: , Rfl:  .  docusate calcium (SURFAK) 240 MG capsule, Take 480 mg by mouth at bedtime., Disp: , Rfl:  .  Esomeprazole Magnesium (NEXIUM PO), Take by mouth daily as needed., Disp: , Rfl:  .  furosemide (LASIX) 40 MG tablet, Take 20 mg by mouth daily. , Disp: , Rfl:  .  gabapentin (NEURONTIN) 100 MG capsule, Take 1-3 capsules (100-300 mg total) by mouth at bedtime. Follow written titration schedule., Disp: 90 capsule, Rfl: 0 .  GLUCOSAMINE SULFATE PO, Take by mouth daily., Disp: , Rfl:  .  levOCARNitine L-Tartrate (L-CARNITINE) 500 MG CAPS, Take 500 mg by mouth daily with lunch. , Disp: , Rfl:  .  magnesium oxide (MAG-OX) 400 MG tablet, Take 400 mg by mouth daily., Disp: , Rfl:  .  meloxicam (MOBIC) 15 MG tablet, Take 15 mg by mouth as needed., Disp: , Rfl: 2 .  Multiple Vitamins-Minerals (CENTRUM SILVER PO), Take 1 tablet by mouth daily., Disp: , Rfl:  .  Multiple Vitamins-Minerals (ICAPS AREDS 2 PO), Take 1 capsule by mouth daily. , Disp: , Rfl:  .  Omega 3-6-9 Fatty Acids (OMEGA-3-6-9 PO), Take 1 capsule by mouth daily., Disp: , Rfl:  .  pantoprazole (PROTONIX) 40 MG tablet, Take 1 tablet (40 mg total) by mouth 2 (two) times daily. (Patient taking differently: Take 40 mg by mouth 2 (two) times daily before a meal.), Disp: 60 tablet, Rfl: 7 .  sacubitril-valsartan (ENTRESTO) 24-26 MG, Take 1 tablet by mouth 2 (two) times daily., Disp: , Rfl:  .  Selenium 200 MCG CAPS, Take 200 mcg by mouth daily. , Disp: , Rfl:  .  spironolactone (ALDACTONE) 25 MG tablet, Take 12.5 mg by mouth daily., Disp: , Rfl:  .  tamsulosin (FLOMAX) 0.4 MG  CAPS capsule, Take 0.4 mg by mouth at bedtime. , Disp: , Rfl:  .  tetrahydrozoline 0.05 % ophthalmic solution, Place 1 drop into both eyes daily as needed (for dry eyes)., Disp: , Rfl:  .  timolol (TIMOPTIC) 0.5 % ophthalmic solution, 1 drop daily., Disp: , Rfl:  .  traMADol (ULTRAM) 50 MG tablet, Take by mouth every 6 (six) hours as needed., Disp: , Rfl:  .  vitamin B-12 (CYANOCOBALAMIN) 1000 MCG tablet, Take 1,000 mcg by mouth daily., Disp: , Rfl:  .  zinc gluconate 50 MG tablet, Take 50 mg by mouth daily., Disp: , Rfl:  .  carbidopa-levodopa (SINEMET IR) 25-100 MG tablet, Take by mouth. , Disp: , Rfl:   Imaging Review  Lumbosacral Imaging: Lumbar MR wo contrast: Results for orders placed during the hospital encounter of 01/11/15  MR Lumbar Spine Wo Contrast  Narrative CLINICAL DATA:  Low back pain with weakness in both legs  EXAM: MRI LUMBAR SPINE WITHOUT CONTRAST  TECHNIQUE: Multiplanar, multisequence MR imaging of the lumbar spine was performed. No intravenous contrast  was administered.  COMPARISON:  05/24/2006  FINDINGS: The vertebral bodies of the lumbar spine are normal in size. There is 2 mm of retrolisthesis of L1 on L2, L2 on L3 and L3 on L4. There is 3 mm of anterolisthesis of L4 on L5 secondary to facet disease. There is 2 mm of retrolisthesis of L5 on S1. There is normal bone marrow signal demonstrated throughout the vertebra. There is degenerative disc disease throughout the lumbar spine. 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  The spinal cord is normal in signal and contour. The cord terminates normally at L1 . The nerve roots of the cauda equina and the filum terminale are normal.  The visualized portions of the SI joints are unremarkable.  There is a large, partially visualized T2 hyperintense left renal mass likely representing a cyst.  L1-2: Mild broad-based disc bulge. Mild bilateral facet arthropathy. Mild spinal stenosis. Mild bilateral  foraminal stenosis.  L2-3: Mild broad-based disc bulge. Mild bilateral facet arthropathy. Mild spinal stenosis. Mild bilateral foraminal stenosis.  L3-4 Mild broad-based disc bulge. Moderate bilateral facet arthropathy. Moderate spinal stenosis. Moderate bilateral foraminal stenosis.  L4-5 Uncovering of the disc secondary to grade 1 anterolisthesis with an superimposed mild broad-based disc bulge. Severe bilateral facet arthropathy with ligamentum flavum infolding resulting in severe spinal stenosis and bilateral lateral recess stenosis. Moderate right foraminal stenosis. No left foraminal stenosis.  L5-S1 Mild broad-based disc bulge. Moderate left and mild right facet arthropathy. Severe left foraminal stenosis. No right foraminal stenosis.  IMPRESSION: 1. At L4-5 there is uncovering of the disc secondary to grade 1 anterolisthesis with an superimposed mild broad-based disc bulge. Severe bilateral facet arthropathy with ligamentum flavum infolding resulting in severe spinal stenosis and bilateral lateral recess stenosis. Moderate right foraminal stenosis. 2. At L5-S1 there is a mild broad-based disc bulge. Moderate left and mild right facet arthropathy. Severe left foraminal stenosis.   Electronically Signed By: Kathreen Devoid On: 01/11/2015 15:09   Complexity Note: Imaging results reviewed. Results shared with Mr. Olden, using Layman's terms.                         ROS  Cardiovascular: Heart trouble, Daily Aspirin intake, Heart surgery, Weak heart (CHF) and Needs antibiotics prior to dental procedures Pulmonary or Respiratory: Snoring  and Temporary stoppage of breathing during sleep Neurological: Stroke (Residual deficits or weakness: mild ) Psychological-Psychiatric: No reported psychological or psychiatric signs or symptoms such as difficulty sleeping, anxiety, depression, delusions or hallucinations (schizophrenial), mood swings (bipolar disorders) or suicidal  ideations or attempts Gastrointestinal: Irregular, infrequent bowel movements (Constipation) Genitourinary: No reported renal or genitourinary signs or symptoms such as difficulty voiding or producing urine, peeing blood, non-functioning kidney, kidney stones, difficulty emptying the bladder, difficulty controlling the flow of urine, or chronic kidney disease Hematological: No reported hematological signs or symptoms such as prolonged bleeding, low or poor functioning platelets, bruising or bleeding easily, hereditary bleeding problems, low energy levels due to low hemoglobin or being anemic Endocrine: No reported endocrine signs or symptoms such as high or low blood sugar, rapid heart rate due to high thyroid levels, obesity or weight gain due to slow thyroid or thyroid disease Rheumatologic: No reported rheumatological signs and symptoms such as fatigue, joint pain, tenderness, swelling, redness, heat, stiffness, decreased range of motion, with or without associated rash Musculoskeletal: Negative for myasthenia gravis, muscular dystrophy, multiple sclerosis or malignant hyperthermia Work History: Working full time  Allergies  Mr. Schoffstall has  No Known Allergies.  Laboratory Chemistry Profile   Renal Lab Results  Component Value Date   BUN 26 (H) 10/27/2019   CREATININE 1.21 10/27/2019   GFRAA >60 10/27/2019   GFRNONAA 57 (L) 10/27/2019   PROTEINUR NEGATIVE 05/05/2018     Electrolytes Lab Results  Component Value Date   NA 140 10/27/2019   K 5.5 (H) 10/27/2019   CL 109 10/27/2019   CALCIUM 9.8 10/27/2019     Hepatic Lab Results  Component Value Date   AST 26 05/05/2018   ALT 25 05/05/2018   ALBUMIN 3.9 05/05/2018   ALKPHOS 73 05/05/2018     ID Lab Results  Component Value Date   SARSCOV2NAA NEGATIVE 12/31/2018   STAPHAUREUS POSITIVE (A) 05/05/2018   MRSAPCR NEGATIVE 05/05/2018     Bone No results found for: VD25OH, [id number], DG6440HK7, QQ5956LO7, 25OHVITD1,  25OHVITD2, 25OHVITD3, TESTOFREE, TESTOSTERONE   Endocrine    Neuropathy Lab Results  Component Value Date   VITAMINB12 896 10/16/2009   FOLATE  10/16/2009    >20.0 (NOTE)  Reference Ranges        Deficient:       0.4 - 3.3 ng/mL        Indeterminate:   3.4 - 5.4 ng/mL        Normal:              > 5.4 ng/mL   HGBA1C  10/16/2009    5.4 (NOTE)                                                                       According to the ADA Clinical Practice Recommendations for 2011, when HbA1c is used as a screening test:   >=6.5%   Diagnostic of Diabetes Mellitus           (if abnormal result  is confirmed)  5.7-6.4%   Increased risk of developing Diabetes Mellitus  References:Diagnosis and Classification of Diabetes Mellitus,Diabetes FIEP,3295,18(ACZYS 1):S62-S69 and Standards of Medical Care in         Diabetes - 2011,Diabetes Care,2011,34  (Suppl 1):S11-S61.     CNS No results found for: COLORCSF, APPEARCSF, RBCCOUNTCSF, WBCCSF, POLYSCSF, LYMPHSCSF, EOSCSF, PROTEINCSF, GLUCCSF, JCVIRUS, CSFOLI, IGGCSF, LABACHR, ACETBL, LABACHR, ACETBL   Inflammation (CRP: Acute  ESR: Chronic) Lab Results  Component Value Date   CRP <0.8 05/05/2018   ESRSEDRATE 3 05/05/2018     Rheumatology Lab Results  Component Value Date   ANA NEGATIVE 10/16/2009     Coagulation Lab Results  Component Value Date   INR 0.9 10/27/2019   LABPROT 11.6 10/27/2019   APTT 30 05/05/2018   PLT 238 10/27/2019     Cardiovascular Lab Results  Component Value Date   BNP 812.0 (H) 08/05/2018   HGB 12.5 (L) 10/27/2019   HCT 38.8 (L) 10/27/2019     Screening Lab Results  Component Value Date   SARSCOV2NAA NEGATIVE 12/31/2018   STAPHAUREUS POSITIVE (A) 05/05/2018   MRSAPCR NEGATIVE 05/05/2018     Cancer No results found for: CEA, CA125, LABCA2   Allergens No results found for: ALMOND, APPLE, ASPARAGUS, AVOCADO, BANANA, BARLEY, BASIL, BAYLEAF, GREENBEAN, LIMABEAN, WHITEBEAN, BEEFIGE, REDBEET, BLUEBERRY,  BROCCOLI, CABBAGE, MELON, CARROT, CASEIN, CASHEWNUT, CAULIFLOWER, CELERY     Note:  Lab results reviewed.  [place]  Drug: Mr. Maners  reports no history of drug use. Alcohol:  reports current alcohol use. Tobacco:  reports that he has never smoked. He has never used smokeless tobacco. 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), CHF (congestive heart failure) ([place]), Chronic back pain, Coronary atherosclerosis of autologous vein bypass graft, Dysrhythmia, Essential hypertension, benign, GERD (gastroesophageal reflux disease), Hyperlipidemia, Intermittent vertigo, Labral tear of shoulder, Macular degeneration, Multinodular goiter (07/2018), OSA (obstructive sleep apnea), Prostate cancer ([place]) (10/2015), S/P CABG x 4 (2005), Stroke [facility]) (2011), Synovitis and tenosynovitis, unspecified, Thoracic aortic aneurysm without rupture ([place]) (10/2019), Tubular adenoma of colon, and Unspecified vitamin D deficiency. Family: family history includes Alzheimer's disease in his father; Diabetes in his paternal grandfather; Heart disease in his mother; Parkinsonism in his father; Stomach cancer in his cousin.  Past Surgical History:  Procedure Laterality Date  . CARDIAC CATHETERIZATION  02/01/2004   L main with 60% prox stenosis, LAD calcified in prox section with 95% ostial lesion, diffusion 50-60% disease in mid LAD with focal 90% mid LAD stenosis; 1st diagonal coarsely irregular with up to 30% stenosis in prox & mid segments; L Cfx with 3 OMs, AV groove Cfx with 40-50% stenosis; 3rd OM with 50% prox stenosis; RCA with PDA & PLA, PLA with 95% osital lesion (Dr. Gerrie Nordmann)  . CARPAL TUNNEL RELEASE    . CATARACT EXTRACTION, BILATERAL    . COLONOSCOPY    . CORONARY ARTERY BYPASS GRAFT  02/02/2004   LIMA to DX1, RIMA to RCA, SVG to distal LAD, SVG to Cfx marginal (Dr. Remus Loffler)  . EYE SURGERY Bilateral    cataract extractions  . gold seed  marker placement/prostate  02/28/2016  . [place]  2002  . JOINT REPLACEMENT Left 2014   knee  . JOINT REPLACEMENT Right 05/17/2018   knee  . KNEE ARTHROPLASTY Right 05/17/2018   Procedure: COMPUTER ASSISTED TOTAL KNEE ARTHROPLASTY;  Surgeon: Dereck Leep, MD;  Location: [facility] ORS;  Service: Orthopedics;  Laterality: Right;  . KNEE ARTHROSCOPY  01/05/2019   Procedure: ARTHROSCOPY KNEE, LYSIS OF ADHESIONS;  Surgeon: Dereck Leep, MD;  Location: [facility] ORS;  Service: Orthopedics;;  . NM MYOCAR PERF WALL MOTION  06/2012   lexiscan myoview - normal low risk study, EF 47%  . PAROTIDECTOMY Left 08/04/2017   Procedure: SUPERFICIAL PAROTIDECTOMY;  Surgeon: Beverly Gust, MD;  Location: [facility] ORS;  Service: ENT;  Laterality: Left;  . 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 TONSILLECTOMY AND ADENOIDECTOMY    . TOTAL KNEE ARTHROPLASTY Left    Dr. Marry Guan [facility] [place])  . TRANSTHORACIC ECHOCARDIOGRAM  06/2012   EF 50-55%, mild MR; LA mild-mod dilated; RVSP increased  . UPPER GASTROINTESTINAL ENDOSCOPY  05/03/2020   Active Ambulatory Problems    Diagnosis Date Noted  . HYPERLIPIDEMIA 10/04/2009  . HYPERTENSION 10/04/2009  . 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 TIA 03/27/2010  . GERD 10/04/2009  . PYELONEPHRITIS 10/04/2009  . PSA, INCREASED 10/04/2009  . 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 HEMORRHOIDECTOMY, HX OF 10/04/2009  . Malignant neoplasm of prostate ([place]) 11/20/2015  . Lymphocytosis 02/21/2016  . Ganglion and cyst of synovium, tendon and bursa 05/17/2018  . CAD (coronary artery disease) 05/17/2018  . Carpal tunnel syndrome 05/17/2018  . Chronic back pain 05/17/2018  . History of stroke 05/17/2018  .  Synovitis and tenosynovitis, unspecified 05/17/2018  . Vitamin D deficiency 05/17/2018  . History of bilateral knee replacement 05/17/2018  . 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 S/P right knee arthroscopy  01/05/2019  . Cervicalgia 08/16/2020  . Chronic pain of both shoulders 08/16/2020  . Chronic bilateral low back pain without sciatica 08/16/2020  . Primary osteoarthritis of left knee 08/16/2020  . Primary osteoarthritis of right knee 08/16/2020  . Chronic pain syndrome 08/16/2020   Resolved Ambulatory Problems    Diagnosis Date Noted  . No Resolved Ambulatory Problems   Past Medical History:  Diagnosis Date  . Allergic rhinitis   . 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 CHF (congestive heart failure) ([place])   . Coronary atherosclerosis of autologous vein bypass graft   . Dysrhythmia   . Essential hypertension, benign   . GERD (gastroesophageal reflux disease)   . Hyperlipidemia   . Intermittent vertigo   . Labral tear of shoulder   . Macular degeneration   . OSA (obstructive sleep apnea)   . Prostate cancer ([place]) 10/2015  . Stroke [facility]) 2011  . Thoracic aortic aneurysm without rupture ([place]) 10/2019  . Tubular adenoma of colon   . Unspecified vitamin D deficiency    Constitutional Exam  General appearance: Well nourished, well developed, and well hydrated. In no apparent acute distress Vitals:   08/16/20 1400  BP: 112/62  Pulse: 76  Resp: 16  Temp: (!) 97.1 F (36.2 C)  TempSrc: Temporal  SpO2: 96%  Weight: 185 lb (83.9 kg)  Height: 5' 7"  (1.702 m)   BMI Assessment: Estimated body mass index is 28.98 kg/m as calculated from the following:   Height as of this encounter: 5' 7"  (1.702 m).   Weight as of this encounter: 185 lb (83.9 kg).  BMI interpretation table: BMI level Category Range association with higher incidence of chronic pain  <18 kg/m2 Underweight   18.5-24.9 kg/m2 Ideal body weight   25-29.9 kg/m2 Overweight Increased incidence by 20%  30-34.9 kg/m2 Obese (Class I) Increased incidence by 68%  35-39.9 kg/m2 Severe obesity (Class II) Increased incidence by 136%  >40 kg/m2 Extreme obesity (Class III) Increased incidence by 254%   Patient's current BMI Ideal  Body weight  Body mass index is 28.98 kg/m. Ideal body weight: 66.1 kg (145 lb 11.6 oz) Adjusted ideal body weight: 73.2 kg (161 lb 6.9 oz)   BMI Readings from Last 4 Encounters:  08/16/20 28.98 kg/m  05/03/20 29.86 kg/m  04/11/20 29.86 kg/m  02/06/20 29.54 kg/m   Wt Readings from Last 4 Encounters:  08/16/20 185 lb (83.9 kg)  05/03/20 185 lb (83.9 kg)  04/11/20 185 lb (83.9 kg)  02/06/20 183 lb (83 kg)    Psych/Mental status: Alert, oriented x 3 (person, place, & time)       Eyes: PERLA Respiratory: No evidence of acute respiratory distress  Cervical Spine Exam  Skin & Axial Inspection: No masses, redness, edema, swelling, or associated skin lesions Alignment: Symmetrical Functional ROM: Unrestricted ROM      Stability: No instability detected Muscle Tone/Strength: Functionally intact. No obvious neuro-muscular anomalies detected. Sensory (Neurological): Musculoskeletal pain pattern Palpation: No palpable anomalies              Upper Extremity (UE) Exam    Side: Right upper extremity  Side: Left upper extremity  Skin & Extremity Inspection: Skin color, temperature, and hair growth are WNL. No peripheral edema or cyanosis. No masses, redness, swelling, asymmetry, or associated skin lesions. No  contractures.  Skin & Extremity Inspection: Skin color, temperature, and hair growth are WNL. No peripheral edema or cyanosis. No masses, redness, swelling, asymmetry, or associated skin lesions. No contractures.  Functional ROM: Pain restricted ROM for all joints of upper extremity  Functional ROM: Pain restricted ROM for all joints of upper extremity  Muscle Tone/Strength: Functionally intact. No obvious neuro-muscular anomalies detected.   Muscle Tone/Strength: Functionally intact. No obvious neuro-muscular anomalies detected.  Sensory (Neurological): Unimpaired          Sensory (Neurological): Unimpaired          Palpation: No palpable anomalies              Palpation: No palpable  anomalies              Provocative Test(s):  Phalen's test: deferred Tinel's test: deferred Apley's scratch test (touch opposite shoulder):  Action 1 (Across chest): Decreased ROM Action 2 (Overhead): Decreased ROM Action 3 (LB reach): Decreased ROM   Provocative Test(s):  Phalen's test: deferred Tinel's test: deferred Apley's scratch test (touch opposite shoulder):  Action 1 (Across chest): Decreased ROM Action 2 (Overhead): Decreased ROM Action 3 (LB reach): Decreased ROM    Thoracic Spine Area Exam  Skin & Axial Inspection: No masses, redness, or swelling Alignment: Symmetrical Functional ROM: Unrestricted ROM Stability: No instability detected Muscle Tone/Strength: Functionally intact. No obvious neuro-muscular anomalies detected. Sensory (Neurological): Musculoskeletal pain pattern  Lumbar Exam  Skin & Axial Inspection: No masses, redness, or swelling Alignment: Symmetrical Functional ROM: Pain restricted ROM affecting both sides Stability: No instability detected Muscle Tone/Strength: Functionally intact. No obvious neuro-muscular anomalies detected. Sensory (Neurological): Musculoskeletal pain pattern Positive pain with facet loading  Gait & Posture Assessment  Ambulation: Unassisted Gait: Antalgic Posture: Difficulty standing up straight, due to pain   Lower Extremity Exam    Side: Right lower extremity  Side: Left lower extremity  Stability: No instability observed          Stability: No instability observed          Skin & Extremity Inspection: Evidence of prior arthroplastic surgery  Skin & Extremity Inspection: Evidence of prior arthroplastic surgery  Functional ROM: Pain restricted ROM for hip and knee joints          Functional ROM: Pain restricted ROM for hip and knee joints          Muscle Tone/Strength: Functionally intact. No obvious neuro-muscular anomalies detected.  Muscle Tone/Strength: Functionally intact. No obvious neuro-muscular anomalies  detected.  Sensory (Neurological): Arthropathic arthralgia        Sensory (Neurological): Arthropathic arthralgia        DTR: Patellar: deferred today Achilles: deferred today Plantar: deferred today  DTR: Patellar: deferred today Achilles: deferred today Plantar: deferred today  Palpation: No palpable anomalies  Palpation: No palpable anomalies   Assessment  Primary Diagnosis & Pertinent Problem List: The primary encounter diagnosis was Neck pain. Diagnoses of Cervicalgia, Chronic pain of both shoulders, History of bilateral knee replacement, Chronic bilateral low back pain without sciatica, Primary osteoarthritis of left knee, Primary osteoarthritis of right knee, Chronic pain syndrome, Bilateral hip pain, and Chronic SI joint pain were also pertinent to this visit.  Visit Diagnosis (New problems to examiner): 1. Neck pain   2. Cervicalgia   3. Chronic pain of both shoulders   4. History of bilateral knee replacement   5. Chronic bilateral low back pain without sciatica   6. Primary osteoarthritis of left knee   7. Primary  osteoarthritis of right knee   8. Chronic pain syndrome   9. Bilateral hip pain   10. Chronic SI joint pain    Plan of Care (Initial workup plan)  General Recommendations: The pain condition that the patient suffers from is best treated with a multidisciplinary approach that involves an increase in physical activity to prevent de-conditioning and worsening of the pain cycle, as well as psychological counseling (formal and/or informal) to address the co-morbid psychological affects of pain. Treatment will often involve judicious use of pain medications and interventional procedures to decrease the pain, allowing the patient to participate in the physical activity that will ultimately produce long-lasting pain reductions. The goal of the multidisciplinary approach is to return the patient to a higher level of overall function and to restore their ability to perform  activities of daily living.  1.  Recommend patient start gabapentin as below.  Titration instructions provided 2.  Continue tramadol as prescribed by Dr. Caryl Comes.  Can explore alternatives in future.  However patient would like to avoid control to opioid analgesics 3.  X-rays of pain generators below 4.  Discussed diagnostic genicular nerve block and possible radiofrequency ablation for knee osteoarthritis we will discuss further when patient follows up in 3 to 4 weeks.   Imaging Orders     DG Cervical Spine With Flex & Extend     DG HIP UNILAT W OR W/O PELVIS 2-3 VIEWS RIGHT     DG HIP UNILAT W OR W/O PELVIS 2-3 VIEWS LEFT     DG Lumbar Spine Complete W/Bend     DG Shoulder Right     DG Shoulder Left     DG Si Joints  Pharmacotherapy (current): Medications ordered:  Meds ordered this encounter  Medications  . gabapentin (NEURONTIN) 100 MG capsule    Sig: Take 1-3 capsules (100-300 mg total) by mouth at bedtime. Follow written titration schedule.    Dispense:  90 capsule    Refill:  0    Fill one day early if pharmacy is closed on scheduled refill date. May substitute for generic if available.   Medications administered during this visit: Marlos Carmen. Jocson had no medications administered during this visit.   Pharmacological management options:  Opioid Analgesics: Currently on tramadol 50 mg 3 times daily 4 times daily as needed prescribed by PCP, Dr. Claretha Cooper stabilizer: Trial of gabapentin as above.  Consider Lyrica in future  Muscle relaxant: To be determined at a later time  NSAID: Medically contraindicated  Other analgesic(s): To be determined at a later time   Interventional management options: Mr. Geibel was informed that there is no guarantee that he would be a candidate for interventional therapies. The decision will be based on the results of diagnostic studies, as well as Mr. Caudill risk profile.  Procedure(s) under consideration:  Pending x-rays,  axillary nerve block, suprascapular nerve block, genicular nerve block possible RFA   Provider-requested follow-up: Return in about 3 weeks (around 09/06/2020) for Medication Management, in person.  Future Appointments  Date Time Provider [place]  09/13/2020  3:00 PM Gillis Santa, MD [facility] None    Note by: Gillis Santa, MD Date: 08/16/2020; Time: 3:12 PM

## 2020-08-16 NOTE — Progress Notes (Signed)
Safety precautions to be maintained throughout the outpatient stay will include: orient to surroundings, keep bed in low position, maintain call bell within reach at all times, provide assistance with transfer out of bed and ambulation.  

## 2020-09-13 ENCOUNTER — Encounter: Payer: Medicare Other | Admitting: Student in an Organized Health Care Education/Training Program

## 2020-11-15 ENCOUNTER — Other Ambulatory Visit (HOSPITAL_COMMUNITY): Payer: Self-pay | Admitting: Internal Medicine

## 2020-11-15 ENCOUNTER — Other Ambulatory Visit: Payer: Self-pay | Admitting: Internal Medicine

## 2020-11-15 DIAGNOSIS — I712 Thoracic aortic aneurysm, without rupture, unspecified: Secondary | ICD-10-CM

## 2020-11-30 ENCOUNTER — Ambulatory Visit
Admission: RE | Admit: 2020-11-30 | Discharge: 2020-11-30 | Disposition: A | Discharge status: Home / Self Care | Payer: Medicare Other | Source: Ambulatory Visit | Attending: Internal Medicine | Admitting: Internal Medicine

## 2020-11-30 ENCOUNTER — Other Ambulatory Visit: Payer: Self-pay

## 2020-11-30 DIAGNOSIS — I712 Thoracic aortic aneurysm, without rupture, unspecified: Secondary | ICD-10-CM

## 2020-11-30 MED ORDER — GADOBUTROL 1 MMOL/ML IV SOLN
8.0000 mL | Freq: Once | INTRAVENOUS | Status: AC | PRN
  Administered 2020-11-30: 8 mL via INTRAVENOUS

## 2020-12-31 ENCOUNTER — Ambulatory Visit
Admission: RE | Admit: 2020-12-31 | Discharge: 2020-12-31 | Disposition: A | Discharge status: Home / Self Care | Payer: Medicare Other | Source: Ambulatory Visit | Attending: Physician Assistant | Admitting: Physician Assistant

## 2020-12-31 ENCOUNTER — Other Ambulatory Visit: Payer: Self-pay

## 2020-12-31 ENCOUNTER — Ambulatory Visit: Payer: Medicare Other

## 2020-12-31 ENCOUNTER — Other Ambulatory Visit (HOSPITAL_COMMUNITY): Payer: Self-pay | Admitting: Physician Assistant

## 2020-12-31 ENCOUNTER — Other Ambulatory Visit: Payer: Self-pay | Admitting: Physician Assistant

## 2020-12-31 DIAGNOSIS — S0990XA Unspecified injury of head, initial encounter: Secondary | ICD-10-CM | POA: Insufficient documentation

## 2020-12-31 DIAGNOSIS — W19XXXA Unspecified fall, initial encounter: Secondary | ICD-10-CM | POA: Diagnosis not present

## 2021-01-03 ENCOUNTER — Other Ambulatory Visit: Payer: Self-pay | Admitting: Physician Assistant

## 2021-01-03 ENCOUNTER — Other Ambulatory Visit (HOSPITAL_COMMUNITY): Payer: Self-pay | Admitting: Physician Assistant

## 2021-01-03 DIAGNOSIS — E041 Nontoxic single thyroid nodule: Secondary | ICD-10-CM

## 2021-01-10 ENCOUNTER — Other Ambulatory Visit: Payer: Self-pay

## 2021-01-10 ENCOUNTER — Ambulatory Visit
Admission: RE | Admit: 2021-01-10 | Discharge: 2021-01-10 | Disposition: A | Discharge status: Home / Self Care | Payer: Medicare Other | Source: Ambulatory Visit | Attending: Physician Assistant | Admitting: Physician Assistant

## 2021-01-10 DIAGNOSIS — E041 Nontoxic single thyroid nodule: Secondary | ICD-10-CM | POA: Diagnosis not present

## 2021-01-24 ENCOUNTER — Other Ambulatory Visit: Payer: Self-pay | Admitting: Unknown Physician Specialty

## 2021-01-24 DIAGNOSIS — E041 Nontoxic single thyroid nodule: Secondary | ICD-10-CM

## 2021-01-29 ENCOUNTER — Ambulatory Visit
Admission: RE | Admit: 2021-01-29 | Discharge: 2021-01-29 | Disposition: A | Discharge status: Home / Self Care | Payer: Medicare Other | Source: Ambulatory Visit | Attending: Unknown Physician Specialty | Admitting: Unknown Physician Specialty

## 2021-01-29 ENCOUNTER — Other Ambulatory Visit: Payer: Self-pay

## 2021-01-29 DIAGNOSIS — E041 Nontoxic single thyroid nodule: Secondary | ICD-10-CM | POA: Diagnosis not present

## 2021-01-29 NOTE — Procedures (Signed)
Successful US guided FNA of Right mid thyroid nodule x 5 passes. No complications. See PACS for full report.  Tsosie Billing PA-C Interventional Radiology  01/29/2021 1:37 PM

## 2021-01-30 LAB — CYTOLOGY - NON PAP

## 2021-02-27 ENCOUNTER — Other Ambulatory Visit: Payer: Self-pay

## 2021-02-27 MED ORDER — PANTOPRAZOLE SODIUM 40 MG PO TBEC: 40.0000 mg | DELAYED_RELEASE_TABLET | Freq: Two times a day (BID) | ORAL | 2 refills | Status: DC

## 2021-06-03 ENCOUNTER — Encounter: Payer: Self-pay | Admitting: *Deleted

## 2021-06-06 ENCOUNTER — Telehealth: Payer: Self-pay | Admitting: *Deleted

## 2021-06-06 NOTE — Telephone Encounter (Signed)
Attempted to reach NP prior to his apt tomorrow with Dr. Tasia Catchings. pt did not answer incoming call. mail box is full. unable to reach patient.

## 2021-06-07 ENCOUNTER — Inpatient Hospital Stay: Payer: Medicare Other

## 2021-06-07 ENCOUNTER — Encounter: Payer: Self-pay | Admitting: Oncology

## 2021-06-07 ENCOUNTER — Other Ambulatory Visit: Payer: Self-pay

## 2021-06-07 ENCOUNTER — Inpatient Hospital Stay: Payer: Medicare Other | Attending: Oncology | Admitting: Oncology

## 2021-06-07 VITALS — BP 92/57 | HR 60 | Temp 97.9°F | Wt 176.0 lb

## 2021-06-07 DIAGNOSIS — D649 Anemia, unspecified: Secondary | ICD-10-CM | POA: Insufficient documentation

## 2021-06-07 DIAGNOSIS — D72820 Lymphocytosis (symptomatic): Secondary | ICD-10-CM

## 2021-06-07 DIAGNOSIS — Z8546 Personal history of malignant neoplasm of prostate: Secondary | ICD-10-CM | POA: Diagnosis not present

## 2021-06-07 DIAGNOSIS — C8510 Unspecified B-cell lymphoma, unspecified site: Secondary | ICD-10-CM | POA: Diagnosis present

## 2021-06-07 DIAGNOSIS — R5383 Other fatigue: Secondary | ICD-10-CM | POA: Insufficient documentation

## 2021-06-07 DIAGNOSIS — Z79899 Other long term (current) drug therapy: Secondary | ICD-10-CM | POA: Insufficient documentation

## 2021-06-07 LAB — CBC WITH DIFFERENTIAL/PLATELET
Abs Immature Granulocytes: 0.03 10*3/uL (ref 0.00–0.07)
Basophils Absolute: 0.1 10*3/uL (ref 0.0–0.1)
Basophils Relative: 1 %
Eosinophils Absolute: 0.3 10*3/uL (ref 0.0–0.5)
Eosinophils Relative: 2 %
HCT: 39.2 % (ref 39.0–52.0)
Hemoglobin: 12.7 g/dL — ABNORMAL LOW (ref 13.0–17.0)
Immature Granulocytes: 0 %
Lymphocytes Relative: 65 %
Lymphs Abs: 11.9 10*3/uL — ABNORMAL HIGH (ref 0.7–4.0)
MCH: 30 pg (ref 26.0–34.0)
MCHC: 32.4 g/dL (ref 30.0–36.0)
MCV: 92.5 fL (ref 80.0–100.0)
Monocytes Absolute: 1.8 10*3/uL — ABNORMAL HIGH (ref 0.1–1.0)
Monocytes Relative: 10 %
Neutro Abs: 4 10*3/uL (ref 1.7–7.7)
Neutrophils Relative %: 22 %
Platelets: 230 10*3/uL (ref 150–400)
RBC: 4.24 MIL/uL (ref 4.22–5.81)
RDW: 13.1 % (ref 11.5–15.5)
Smear Review: NORMAL
WBC: 18.2 10*3/uL — ABNORMAL HIGH (ref 4.0–10.5)
nRBC: 0 % (ref 0.0–0.2)

## 2021-06-07 LAB — COMPREHENSIVE METABOLIC PANEL
ALT: 7 U/L (ref 0–44)
AST: 26 U/L (ref 15–41)
Albumin: 4 g/dL (ref 3.5–5.0)
Alkaline Phosphatase: 59 U/L (ref 38–126)
Anion gap: 8 (ref 5–15)
BUN: 27 mg/dL — ABNORMAL HIGH (ref 8–23)
CO2: 24 mmol/L (ref 22–32)
Calcium: 9.5 mg/dL (ref 8.9–10.3)
Chloride: 102 mmol/L (ref 98–111)
Creatinine, Ser: 1.01 mg/dL (ref 0.61–1.24)
GFR, Estimated: >60 mL/min (ref >60)
Glucose, Bld: 98 mg/dL (ref 70–99)
Potassium: 5 mmol/L (ref 3.5–5.1)
Sodium: 134 mmol/L — ABNORMAL LOW (ref 135–145)
Total Bilirubin: 0.7 mg/dL (ref 0.3–1.2)
Total Protein: 6.6 g/dL (ref 6.5–8.1)

## 2021-06-07 LAB — FOLATE: Folate: 90 ng/mL (ref >5.9)

## 2021-06-07 LAB — HEPATITIS PANEL, ACUTE
HCV Ab: NONREACTIVE
Hep A IgM: NONREACTIVE
Hep B C IgM: NONREACTIVE
Hepatitis B Surface Ag: NONREACTIVE

## 2021-06-07 LAB — FERRITIN: Ferritin: 68 ng/mL (ref 24–336)

## 2021-06-07 LAB — IRON AND TIBC
Iron: 92 ug/dL (ref 45–182)
Saturation Ratios: 26 % (ref 17.9–39.5)
TIBC: 350 ug/dL (ref 250–450)
UIBC: 258 ug/dL

## 2021-06-07 LAB — VITAMIN B12: Vitamin B-12: 634 pg/mL (ref 180–914)

## 2021-06-07 LAB — LACTATE DEHYDROGENASE: LDH: 116 U/L (ref 98–192)

## 2021-06-07 LAB — PSA: Prostatic Specific Antigen: 0.09 ng/mL (ref 0.00–4.00)

## 2021-06-08 LAB — HIV ANTIBODY (ROUTINE TESTING W REFLEX): HIV Screen 4th Generation wRfx: NONREACTIVE

## 2021-06-08 NOTE — Progress Notes (Signed)
Hematology/Oncology Consult note [phone] [phone]   Patient Care Team: [name], MD as PCP - General  REFERRING PROVIDER: [name], MD  CHIEF COMPLAINTS/REASON FOR VISIT:  Evaluation of leukocytosis  HISTORY OF PRESENTING ILLNESS:  Wesley [name] is a  80 y.o.  male with PMH listed below who was referred to me for evaluation of leukocytosis Reviewed patient' recent labs obtained by PCP.   05/21/2021 CBC showed elevated white count of 20.1, predominantly lymphocytosis and neutrophilia Previous lab records reviewed. Leukocytosis onset of chronic, duration is since 2019. 05/21/2021 peripheral blood flowcytometry showed 1) CD5 mostly negative to dim+, CD10-, CD23-, FMC-7+ B cell  lymphoproliferative disorder detected, nonspecific phenotype, 2) CD5+, CD23+ clonal B cell population detected, CLL/SLL phenotype, CD38 indeterminate for prognosis (29%), 18% of leukocytes, <5,000 cells/uL.   Denies fever, chills,night sweats. + Fatigue + weight loss 9 pounds since Feb 2022. Significant other cooks healthy food.   Denies History of recent oral steroid use or steroid injection. Denies recent infection:  Denies Autoimmune disease history.    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  History of prostate cancer in 2017, he received radiation.   10/27/2019 CT angio chest abdomen pelvis showed Retroperitoneal adenopathy  Review of Systems  Constitutional:  Positive for fatigue. Negative for appetite change, chills, fever and unexpected weight change.  HENT:   Negative for hearing loss and voice change.   Eyes:  Negative for eye problems and icterus.  Respiratory:  Negative for chest tightness, cough and shortness of breath.   Cardiovascular:  Negative for chest pain and leg swelling.  Gastrointestinal:  Negative for abdominal distention and abdominal pain.  Endocrine: Negative for hot flashes.  Genitourinary:  Negative  for difficulty urinating, dysuria and frequency.   Musculoskeletal:  Negative for arthralgias.  Skin:  Negative for itching and rash.  Neurological:  Negative for light-headedness and numbness.  Hematological:  Negative for adenopathy. Does not bruise/bleed easily.  Psychiatric/Behavioral:  Negative for confusion.     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 sleep study 06/03/2012 - AHI 54.74/hr during total sleep, AHI 49.66/hr during REM   Prostate cancer ([place]) 10/2015   radiation  followed by Dr. Karsten Ro   S/P CABG x 4 2005   Stroke [facility]) 2011   damage to back of brain, difficulty when turning with ambulation   Synovitis and tenosynovitis, unspecified    Thoracic aortic aneurysm without rupture 10/2019   4.4 cm by CA 2021   Tubular adenoma of colon    Unspecified vitamin D deficiency     SURGICAL HISTORY: Past Surgical History:  Procedure Laterality Date   CARDIAC CATHETERIZATION  02/01/2004  L main with 60% prox stenosis, LAD calcified in prox section with 95% ostial lesion, diffusion 50-60% disease in mid LAD with focal 90% mid LAD stenosis; 1st diagonal coarsely irregular with up to 30% stenosis in prox & mid segments; L Cfx with 3 OMs, AV groove Cfx with 40-50% stenosis; 3rd OM with 50% prox stenosis; RCA with PDA & PLA, PLA  with 95% osital lesion (Dr. Gerrie Nordmann)   CARPAL TUNNEL RELEASE     CATARACT EXTRACTION, BILATERAL     COLONOSCOPY     CORONARY ARTERY BYPASS GRAFT  02/02/2004   LIMA to DX1, RIMA to RCA, SVG to distal LAD, SVG to Cfx marginal (Dr. Remus Loffler)   EYE SURGERY Bilateral    cataract extractions   gold seed marker placement/prostate  02/28/2016   HEMORRHOID SURGERY  2002   JOINT REPLACEMENT Left 2014   knee   JOINT REPLACEMENT Right 05/17/2018   knee   KNEE ARTHROPLASTY Right 05/17/2018   Procedure: COMPUTER ASSISTED TOTAL KNEE ARTHROPLASTY;  Surgeon: Dereck Leep, MD;  Location: [facility] ORS;  Service: Orthopedics;  Laterality: Right;   KNEE ARTHROSCOPY  01/05/2019   Procedure: ARTHROSCOPY KNEE, LYSIS OF ADHESIONS;  Surgeon: Dereck Leep, MD;  Location: [facility] ORS;  Service: Orthopedics;;   NM MYOCAR PERF WALL MOTION  06/2012   lexiscan myoview - normal low risk study, EF 47%   PAROTIDECTOMY Left 08/04/2017   Procedure: SUPERFICIAL PAROTIDECTOMY;  Surgeon: Beverly Gust, MD;  Location: [facility] ORS;  Service: ENT;  Laterality: Left;   SOFT TISSUE TUMOR RESECTION  1972   of right jaw x3, pathology unknown, possible sarcoma   TONSILLECTOMY AND ADENOIDECTOMY     TOTAL KNEE ARTHROPLASTY Left    Dr. Marry Guan ([place], [place])   TRANSTHORACIC ECHOCARDIOGRAM  06/2012   EF 50-55%, mild MR; LA mild-mod dilated; RVSP increased   UPPER GASTROINTESTINAL ENDOSCOPY  05/03/2020    SOCIAL HISTORY: Social History   Socioeconomic History   Marital status: Significant Other    Spouse name: Blenda Mounts   Number of children: 3   Years of education: Not on file   Highest education level: Not on file  Occupational History   Occupation: attorney    Comment: still work  Tobacco Use   Smoking status: Never   Smokeless tobacco: Never  Vaping Use   Vaping Use: Never used  Substance and Sexual Activity   Alcohol use: Yes    Comment: occasional beer every 3-4 week   Drug use: No   Sexual activity: Not  Currently  Other Topics Concern   Not on file  Social History Narrative   Not on file   Social Determinants of Health   Financial Resource Strain: Not on file  Food Insecurity: Not on file  Transportation Needs: Not on file  Physical Activity: Not on file  Stress: Not on file  Social Connections: Not on file  Intimate Partner Violence: Not on file    FAMILY HISTORY: Family History  Problem Relation Age of Onset   Heart disease Mother    Parkinsonism Father    Alzheimer's disease Father    Diabetes Paternal Grandfather    Stomach cancer Cousin    Colon cancer Neg Hx    Esophageal cancer Neg Hx    Rectal cancer Neg Hx    Liver cancer Neg Hx    Colon polyps Neg Hx     ALLERGIES:  has No Known Allergies.  MEDICATIONS:  Current Outpatient Medications  Medication Sig  Dispense Refill   acetaminophen (TYLENOL) 325 MG tablet Take 650 mg by mouth every 6 (six) hours as needed.     Ascorbic Acid (VITAMIN C) 1000 MG tablet Take 1,000 mg by mouth daily.      aspirin EC 81 MG tablet Take 81 mg by mouth daily with lunch.     atorvastatin (LIPITOR) 80 MG tablet Take 80 mg by mouth at bedtime.     carvedilol (COREG) 6.25 MG tablet Take 6.25 mg by mouth 2 (two) times daily with a meal.      Coenzyme Q10 100 MG capsule Take 200 mg by mouth daily with lunch.     CRANBERRY PO Take by mouth daily with lunch.      diphenhydrAMINE (BENADRYL) 25 MG tablet Take 25 mg by mouth at bedtime as needed.     docusate calcium (SURFAK) 240 MG capsule Take 480 mg by mouth at bedtime.     Esomeprazole Magnesium (NEXIUM PO) Take by mouth daily as needed.     furosemide (LASIX) 40 MG tablet Take 20 mg by mouth daily.      GLUCOSAMINE SULFATE PO Take by mouth daily.     levOCARNitine L-Tartrate (L-CARNITINE) 500 MG CAPS Take 500 mg by mouth daily with lunch.      magnesium oxide (MAG-OX) 400 MG tablet Take 400 mg by mouth daily.     meloxicam (MOBIC) 15 MG tablet Take 15 mg by mouth as needed.  2    Multiple Vitamins-Minerals (CENTRUM SILVER PO) Take 1 tablet by mouth daily.     Multiple Vitamins-Minerals (ICAPS AREDS 2 PO) Take 1 capsule by mouth daily.      Omega 3-6-9 Fatty Acids (OMEGA-3-6-9 PO) Take 1 capsule by mouth daily.     pantoprazole (PROTONIX) 40 MG tablet Take 1 tablet (40 mg total) by mouth 2 (two) times daily. 60 tablet 2   sacubitril-valsartan (ENTRESTO) 24-26 MG Take 1 tablet by mouth 2 (two) times daily.     Selenium 200 MCG CAPS Take 200 mcg by mouth daily.      spironolactone (ALDACTONE) 25 MG tablet Take 12.5 mg by mouth daily.     tamsulosin (FLOMAX) 0.4 MG CAPS capsule Take 0.4 mg by mouth at bedtime.      tetrahydrozoline 0.05 % ophthalmic solution Place 1 drop into both eyes daily as needed (for dry eyes).     timolol (TIMOPTIC) 0.5 % ophthalmic solution 1 drop daily.     traMADol (ULTRAM) 50 MG tablet Take by mouth every 6 (six) hours as needed.     vitamin B-12 (CYANOCOBALAMIN) 1000 MCG tablet Take 1,000 mcg by mouth daily.     zinc gluconate 50 MG tablet Take 50 mg by mouth daily.     carbidopa-levodopa (SINEMET IR) 25-100 MG tablet Take by mouth.      gabapentin (NEURONTIN) 100 MG capsule Take 1-3 capsules (100-300 mg total) by mouth at bedtime. Follow written titration schedule. 90 capsule 0   No current facility-administered medications for this visit.     PHYSICAL EXAMINATION: ECOG PERFORMANCE STATUS: 1 - Symptomatic but completely ambulatory Vitals:   06/07/21 1255  BP: (!) 92/57  Pulse: 60  Temp: 97.9 F (36.6 C)   Filed Weights   06/07/21 1255  Weight: 176 lb (79.8 kg)    Physical Exam Constitutional:      General: He is not in acute distress. HENT:     Head: Normocephalic and atraumatic.  Eyes:     General: No  scleral icterus. Cardiovascular:     Rate and Rhythm: Normal rate and regular rhythm.     Heart sounds: Normal heart sounds.  Pulmonary:     Effort: Pulmonary effort is normal. No respiratory distress.     Breath sounds:  No wheezing.  Abdominal:     General: Bowel sounds are normal. There is no distension.     Palpations: Abdomen is soft.  Musculoskeletal:        General: No deformity. Normal range of motion.     Cervical back: Normal range of motion and neck supple.  Skin:    General: Skin is warm and dry.     Findings: No erythema or rash.  Neurological:     Mental Status: He is alert and oriented to person, place, and time. Mental status is at baseline.     Cranial Nerves: No cranial nerve deficit.     Coordination: Coordination normal.  Psychiatric:        Mood and Affect: Mood normal.  Small axillary lymph nodes       RADIOGRAPHIC STUDIES: I have personally reviewed the radiological images as listed and agreed with the findings in the report. No results found.  LABORATORY DATA:  I have reviewed the data as listed Lab Results  Component Value Date   WBC 18.2 (H) 06/07/2021   HGB 12.7 (L) 06/07/2021   HCT 39.2 06/07/2021   MCV 92.5 06/07/2021   PLT 230 06/07/2021   Recent Labs    06/07/21 1412  NA 134*  K 5.0  CL 102  CO2 24  GLUCOSE 98  BUN 27*  CREATININE 1.01  CALCIUM 9.5  GFRNONAA >60  PROT 6.6  ALBUMIN 4.0  AST 26  ALT 7  ALKPHOS 59  BILITOT 0.7   Iron/TIBC/Ferritin/ %Sat    Component Value Date/Time   IRON 92 06/07/2021 1412   TIBC 350 06/07/2021 1412   FERRITIN 68 06/07/2021 1412   IRONPCTSAT 26 06/07/2021 1412        ASSESSMENT & PLAN:  1. Lymphocytosis   2. Normocytic anemia   3. History of prostate cancer    Chronic lymphocytosis Labs reviewed and discussed with patient that Leukocytosis, predominantly lymphocytosis.  Possible CLL, or other lymphoma.  Check cbc, smear, multiple myeloma panel, light chain ration, HIV, hepatitis panel, flowcytometry. Check IVGH mutation, CLL panel.   Anemia, check iron tibc ferritin, folate, B12.   History of PSA, retroperitoneal lymphadenopathy, check PSA.    Orders Placed This Encounter  Procedures    Comprehensive metabolic panel    Standing Status:   Future    Number of Occurrences:   1    Standing Expiration Date:   06/07/2022   CBC with Differential/Platelet    Standing Status:   Future    Number of Occurrences:   1    Standing Expiration Date:   06/07/2022   Multiple Myeloma Panel (SPEP&IFE w/QIG)    Standing Status:   Future    Number of Occurrences:   1    Standing Expiration Date:   06/07/2022   Kappa/lambda light chains    Standing Status:   Future    Number of Occurrences:   1    Standing Expiration Date:   06/07/2022   Flow cytometry panel-leukemia/lymphoma work-up    Standing Status:   Future    Number of Occurrences:   1    Standing Expiration Date:   06/07/2022   HIV Antibody (routine testing w rflx)  Standing Status:   Future    Number of Occurrences:   1    Standing Expiration Date:   06/07/2022   Lactate dehydrogenase    Standing Status:   Future    Number of Occurrences:   1    Standing Expiration Date:   06/07/2022   Pathologist smear review    Standing Status:   Future    Number of Occurrences:   1    Standing Expiration Date:   06/07/2022   PSA    Standing Status:   Future    Number of Occurrences:   1    Standing Expiration Date:   06/07/2022   Hepatitis panel, acute    Standing Status:   Future    Number of Occurrences:   1    Standing Expiration Date:   06/07/2022   Iron and TIBC    Standing Status:   Future    Number of Occurrences:   1    Standing Expiration Date:   06/07/2022   Ferritin    Standing Status:   Future    Number of Occurrences:   1    Standing Expiration Date:   12/06/2021   Folate    Standing Status:   Future    Number of Occurrences:   1    Standing Expiration Date:   06/07/2022   Vitamin B12    Standing Status:   Future    Number of Occurrences:   1    Standing Expiration Date:   06/07/2022   FISH HES [id number] REA    Standing Status:   Future    Number of Occurrences:   1    Standing Expiration Date:    06/07/2022    All questions were answered. The patient knows to call the clinic with any problems questions or concerns.  Return of visit: 3 -4  weeks to discuss labs. Thank you for this kind referral and the opportunity to participate in the care of this patient. A copy of today's note is routed to referring provider   Earlie Server, MD, PhD  06/08/2021

## 2021-06-10 LAB — COMP PANEL: LEUKEMIA/LYMPHOMA

## 2021-06-10 LAB — PATHOLOGIST SMEAR REVIEW

## 2021-06-10 LAB — KAPPA/LAMBDA LIGHT CHAINS
Kappa free light chain: 22.9 mg/L — ABNORMAL HIGH (ref 3.3–19.4)
Kappa, lambda light chain ratio: 1.29 (ref 0.26–1.65)
Lambda free light chains: 17.7 mg/L (ref 5.7–26.3)

## 2021-06-12 LAB — FISH HES LEUKEMIA, 4Q12 REA

## 2021-06-13 LAB — MULTIPLE MYELOMA PANEL, SERUM
Albumin SerPl Elph-Mcnc: 3.7 g/dL (ref 2.9–4.4)
Albumin/Glob SerPl: 1.5 (ref 0.7–1.7)
Alpha 1: 0.2 g/dL (ref 0.0–0.4)
Alpha2 Glob SerPl Elph-Mcnc: 0.8 g/dL (ref 0.4–1.0)
B-Globulin SerPl Elph-Mcnc: 0.8 g/dL (ref 0.7–1.3)
Gamma Glob SerPl Elph-Mcnc: 0.7 g/dL (ref 0.4–1.8)
Globulin, Total: 2.5 g/dL (ref 2.2–3.9)
IgA: 39 mg/dL — ABNORMAL LOW (ref 61–437)
IgG (Immunoglobin G), Serum: 842 mg/dL (ref 603–1613)
IgM (Immunoglobulin M), Srm: 20 mg/dL (ref 15–143)
Total Protein ELP: 6.2 g/dL (ref 6.0–8.5)

## 2021-06-18 LAB — IGVH SOMATIC HYPERMUTATION

## 2021-06-19 ENCOUNTER — Other Ambulatory Visit: Payer: Self-pay

## 2021-06-19 ENCOUNTER — Telehealth: Payer: Self-pay

## 2021-06-19 DIAGNOSIS — R591 Generalized enlarged lymph nodes: Secondary | ICD-10-CM

## 2021-06-19 NOTE — Telephone Encounter (Signed)
-----   Message from Earlie Server, MD sent at 06/16/2021  3:29 PM EST ----- Let pt know that I recommend him to get CT chest abdomen pelvis w contrast for further evaluation. Please see if CT can be done before his follow up appt. Prefer CT to be at least 2 days before MD visit. Thanks.

## 2021-06-19 NOTE — Telephone Encounter (Signed)
Pt agreeable to CT. Abby can you check if there is availability prior to next MD visit on 1/5. If not, I will change order to STAT.

## 2021-06-20 ENCOUNTER — Ambulatory Visit
Admission: RE | Admit: 2021-06-20 | Discharge: 2021-06-20 | Disposition: A | Discharge status: Home / Self Care | Payer: Medicare Other | Source: Ambulatory Visit | Attending: Oncology | Admitting: Oncology

## 2021-06-20 DIAGNOSIS — R591 Generalized enlarged lymph nodes: Secondary | ICD-10-CM | POA: Diagnosis not present

## 2021-06-20 MED ORDER — IOHEXOL 300 MG/ML  SOLN
100.0000 mL | Freq: Once | INTRAMUSCULAR | Status: AC | PRN
  Administered 2021-06-20: 10:00:00 100 mL via INTRAVENOUS

## 2021-06-27 ENCOUNTER — Other Ambulatory Visit: Payer: Self-pay

## 2021-06-27 ENCOUNTER — Inpatient Hospital Stay: Payer: Medicare Other | Attending: Oncology | Admitting: Oncology

## 2021-06-27 ENCOUNTER — Encounter: Payer: Self-pay | Admitting: Oncology

## 2021-06-27 VITALS — BP 106/68 | HR 67 | Temp 98.0°F | Wt 174.6 lb

## 2021-06-27 DIAGNOSIS — D649 Anemia, unspecified: Secondary | ICD-10-CM

## 2021-06-27 DIAGNOSIS — Z7189 Other specified counseling: Secondary | ICD-10-CM

## 2021-06-27 DIAGNOSIS — Z8546 Personal history of malignant neoplasm of prostate: Secondary | ICD-10-CM

## 2021-06-27 DIAGNOSIS — R591 Generalized enlarged lymph nodes: Secondary | ICD-10-CM

## 2021-06-27 DIAGNOSIS — D72820 Lymphocytosis (symptomatic): Secondary | ICD-10-CM

## 2021-06-27 NOTE — Progress Notes (Signed)
Hematology/Oncology Consult note [phone] [phone]   Patient Care Team: [name], MD as PCP - General  REFERRING PROVIDER: [name], MD  CHIEF COMPLAINTS/REASON FOR VISIT:  Evaluation of leukocytosis  HISTORY OF PRESENTING ILLNESS:  Wesley [name] is a  81 y.o.  male with PMH listed below who was referred to me for evaluation of leukocytosis Reviewed patient' recent labs obtained by PCP.   05/21/2021 CBC showed elevated white count of 20.1, predominantly lymphocytosis and neutrophilia Previous lab records reviewed. Leukocytosis onset of chronic, duration is since 2019. 05/21/2021 peripheral blood flowcytometry showed 1) CD5 mostly negative to dim+, CD10-, CD23-, FMC-7+ B cell  lymphoproliferative disorder detected, nonspecific phenotype, 2) CD5+, CD23+ clonal B cell population detected, CLL/SLL phenotype, CD38 indeterminate for prognosis (29%), 18% of leukocytes, <5,000 cells/uL.   Denies fever, chills,night sweats. + Fatigue + weight loss 9 pounds since Feb 2022. Significant other cooks healthy food.   Denies History of recent oral steroid use or steroid injection. Denies recent infection:  Denies Autoimmune disease history.    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  History of prostate cancer in 2017, he received radiation.   10/27/2019 CT angio chest abdomen pelvis showed Retroperitoneal adenopathy   INTERVAL HISTORY Wesley [name] is a 81 y.o. male who has above history reviewed by me today presents for follow up visit for lymphadenopathy. During the interval, patient has had CT scan and blood work done.  Patient presents to discuss results.  He reports no new complaints.  Chronic fatigue.   Review of Systems  Constitutional:  Positive for fatigue. Negative for appetite change, chills, fever and unexpected weight change.  HENT:   Negative for hearing loss and voice change.   Eyes:   Negative for eye problems and icterus.  Respiratory:  Negative for chest tightness, cough and shortness of breath.   Cardiovascular:  Negative for chest pain and leg swelling.  Gastrointestinal:  Negative for abdominal distention and abdominal pain.  Endocrine: Negative for hot flashes.  Genitourinary:  Negative for difficulty urinating, dysuria and frequency.   Musculoskeletal:  Negative for arthralgias.  Skin:  Negative for itching and rash.  Neurological:  Negative for light-headedness and numbness.  Hematological:  Negative for adenopathy. Does not bruise/bleed easily.  Psychiatric/Behavioral:  Negative for confusion.     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 sleep study 06/03/2012 - AHI 54.74/hr during total sleep, AHI 49.66/hr during REM   Prostate cancer ([place]) 10/2015   radiation  followed by Dr. Karsten Ro   S/P CABG x 4 2005   Stroke [facility]) 2011   damage to back of brain, difficulty when turning with  ambulation   Synovitis and tenosynovitis, unspecified    Thoracic aortic aneurysm without rupture 10/2019   4.4 cm by CA 2021   Tubular adenoma of colon    Unspecified vitamin D deficiency     SURGICAL HISTORY: Past Surgical History:  Procedure Laterality Date   CARDIAC CATHETERIZATION  02/01/2004   L main with 60% prox  stenosis, LAD calcified in prox section with 95% ostial lesion, diffusion 50-60% disease in mid LAD with focal 90% mid LAD stenosis; 1st diagonal coarsely irregular with up to 30% stenosis in prox & mid segments; L Cfx with 3 OMs, AV groove Cfx with 40-50% stenosis; 3rd OM with 50% prox stenosis; RCA with PDA & PLA, PLA with 95% osital lesion (Dr. Gerrie Nordmann)   CARPAL TUNNEL RELEASE     CATARACT EXTRACTION, BILATERAL     COLONOSCOPY     CORONARY ARTERY BYPASS GRAFT  02/02/2004   LIMA to DX1, RIMA to RCA, SVG to distal LAD, SVG to Cfx marginal (Dr. Remus Loffler)   EYE SURGERY Bilateral    cataract extractions   gold seed marker placement/prostate  02/28/2016   HEMORRHOID SURGERY  2002   JOINT REPLACEMENT Left 2014   knee   JOINT REPLACEMENT Right 05/17/2018   knee   KNEE ARTHROPLASTY Right 05/17/2018   Procedure: COMPUTER ASSISTED TOTAL KNEE ARTHROPLASTY;  Surgeon: Dereck Leep, MD;  Location: [facility] ORS;  Service: Orthopedics;  Laterality: Right;   KNEE ARTHROSCOPY  01/05/2019   Procedure: ARTHROSCOPY KNEE, LYSIS OF ADHESIONS;  Surgeon: Dereck Leep, MD;  Location: [facility] ORS;  Service: Orthopedics;;   NM MYOCAR PERF WALL MOTION  06/2012   lexiscan myoview - normal low risk study, EF 47%   PAROTIDECTOMY Left 08/04/2017   Procedure: SUPERFICIAL PAROTIDECTOMY;  Surgeon: Beverly Gust, MD;  Location: [facility] ORS;  Service: ENT;  Laterality: Left;   SOFT TISSUE TUMOR RESECTION  1972   of right jaw x3, pathology unknown, possible sarcoma   TONSILLECTOMY AND ADENOIDECTOMY     TOTAL KNEE ARTHROPLASTY Left    Dr. Marry Guan [facility] [place])   TRANSTHORACIC ECHOCARDIOGRAM  06/2012   EF 50-55%, mild MR; LA mild-mod dilated; RVSP increased   UPPER GASTROINTESTINAL ENDOSCOPY  05/03/2020    SOCIAL HISTORY: Social History   Socioeconomic History   Marital status: Significant Other    Spouse name: Blenda Mounts   Number of children: 3   Years of education: Not on file   Highest education level: Not on  file  Occupational History   Occupation: attorney    Comment: still work  Tobacco Use   Smoking status: Never   Smokeless tobacco: Never  Vaping Use   Vaping Use: Never used  Substance and Sexual Activity   Alcohol use: Yes    Comment: occasional beer every 3-4 week   Drug use: No   Sexual activity: Not Currently  Other Topics Concern   Not on file  Social History Narrative   Not on file   Social Determinants of Health   Financial Resource Strain: Not on file  Food Insecurity: Not on file  Transportation Needs: Not on file  Physical Activity: Not on file  Stress: Not on file  Social Connections: Not on file  Intimate Partner Violence: Not on file    FAMILY HISTORY: Family History  Problem Relation Age of Onset   Heart disease Mother    Parkinsonism Father    Alzheimer's disease Father    Diabetes Paternal Merchant [employer] officer  Stomach cancer Cousin    Colon cancer Neg Hx    Esophageal cancer Neg Hx    Rectal cancer Neg Hx    Liver cancer Neg Hx    Colon polyps Neg Hx     ALLERGIES:  has No Known Allergies.  MEDICATIONS:  Current Outpatient Medications  Medication Sig Dispense Refill   acetaminophen (TYLENOL) 325 MG tablet Take 650 mg by mouth every 6 (six) hours as needed.     Ascorbic Acid (VITAMIN C) 1000 MG tablet Take 1,000 mg by mouth daily.      aspirin EC 81 MG tablet Take 81 mg by mouth daily with lunch.     atorvastatin (LIPITOR) 80 MG tablet Take 80 mg by mouth at bedtime.     carvedilol (COREG) 6.25 MG tablet Take 6.25 mg by mouth 2 (two) times daily with a meal.      Coenzyme Q10 100 MG capsule Take 200 mg by mouth daily with lunch.     CRANBERRY PO Take by mouth daily with lunch.      diphenhydrAMINE (BENADRYL) 25 MG tablet Take 25 mg by mouth at bedtime as needed.     docusate calcium (SURFAK) 240 MG capsule Take 480 mg by mouth at bedtime.     Esomeprazole Magnesium (NEXIUM PO) Take by mouth daily as needed.     furosemide (LASIX) 40 MG tablet Take  20 mg by mouth daily.      GLUCOSAMINE SULFATE PO Take by mouth daily.     levOCARNitine L-Tartrate (L-CARNITINE) 500 MG CAPS Take 500 mg by mouth daily with lunch.      magnesium oxide (MAG-OX) 400 MG tablet Take 400 mg by mouth daily.     meloxicam (MOBIC) 15 MG tablet Take 15 mg by mouth as needed.  2   Multiple Vitamins-Minerals (CENTRUM SILVER PO) Take 1 tablet by mouth daily.     Multiple Vitamins-Minerals (ICAPS AREDS 2 PO) Take 1 capsule by mouth daily.      Omega 3-6-9 Fatty Acids (OMEGA-3-6-9 PO) Take 1 capsule by mouth daily.     pantoprazole (PROTONIX) 40 MG tablet Take 1 tablet (40 mg total) by mouth 2 (two) times daily. 60 tablet 2   sacubitril-valsartan (ENTRESTO) 24-26 MG Take 1 tablet by mouth 2 (two) times daily.     Selenium 200 MCG CAPS Take 200 mcg by mouth daily.      spironolactone (ALDACTONE) 25 MG tablet Take 12.5 mg by mouth daily.     tamsulosin (FLOMAX) 0.4 MG CAPS capsule Take 0.4 mg by mouth at bedtime.      tetrahydrozoline 0.05 % ophthalmic solution Place 1 drop into both eyes daily as needed (for dry eyes).     timolol (TIMOPTIC) 0.5 % ophthalmic solution 1 drop daily.     traMADol (ULTRAM) 50 MG tablet Take by mouth every 6 (six) hours as needed.     vitamin B-12 (CYANOCOBALAMIN) 1000 MCG tablet Take 1,000 mcg by mouth daily.     zinc gluconate 50 MG tablet Take 50 mg by mouth daily.     carbidopa-levodopa (SINEMET IR) 25-100 MG tablet Take by mouth.      gabapentin (NEURONTIN) 100 MG capsule Take 1-3 capsules (100-300 mg total) by mouth at bedtime. Follow written titration schedule. 90 capsule 0   No current facility-administered medications for this visit.     PHYSICAL EXAMINATION: ECOG PERFORMANCE STATUS: 1 - Symptomatic but completely ambulatory Vitals:   06/27/21 1357  BP: 106/68  Pulse:  67  Temp: 98 F (36.7 C)   Filed Weights   06/27/21 1357  Weight: 174 lb 9.6 oz (79.2 kg)    Physical Exam Constitutional:      General: He is not in  acute distress. HENT:     Head: Normocephalic and atraumatic.  Eyes:     General: No scleral icterus. Cardiovascular:     Rate and Rhythm: Normal rate and regular rhythm.     Heart sounds: Normal heart sounds.  Pulmonary:     Effort: Pulmonary effort is normal. No respiratory distress.     Breath sounds: No wheezing.  Abdominal:     General: Bowel sounds are normal. There is no distension.     Palpations: Abdomen is soft.  Musculoskeletal:        General: No deformity. Normal range of motion.     Cervical back: Normal range of motion and neck supple.  Skin:    General: Skin is warm and dry.     Findings: No erythema or rash.  Neurological:     Mental Status: He is alert and oriented to person, place, and time. Mental status is at baseline.     Cranial Nerves: No cranial nerve deficit.     Coordination: Coordination normal.  Psychiatric:        Mood and Affect: Mood normal.  Small axillary lymph nodes       RADIOGRAPHIC STUDIES: I have personally reviewed the radiological images as listed and agreed with the findings in the report. CT CHEST ABDOMEN PELVIS W CONTRAST  Result Date: 06/20/2021 CLINICAL DATA:  81 year old male with history of leukocytosis. Lymphadenopathy. History of prostate cancer. EXAM: CT CHEST, ABDOMEN, AND PELVIS WITH CONTRAST TECHNIQUE: Multidetector CT imaging of the chest, abdomen and pelvis was performed following the standard protocol during bolus administration of intravenous contrast. CONTRAST:  127m OMNIPAQUE IOHEXOL 300 MG/ML  SOLN COMPARISON:  CT the chest, abdomen and pelvis 10/27/2019. FINDINGS: CT CHEST FINDINGS Cardiovascular: Heart size is normal. There is no significant pericardial fluid, thickening or pericardial calcification. There is aortic atherosclerosis, as well as atherosclerosis of the great vessels of the mediastinum and the coronary arteries, including calcified atherosclerotic plaque in the left main, left anterior descending, left  circumflex and right coronary arteries. Status post median sternotomy for CABG including LIMA to the LAD. Thickening calcification of the aortic valve. Mediastinum/Nodes: Bulky lymphadenopathy in the superior mediastinum, largest of which is adjacent to the proximal left common carotid and left subclavian arteries measuring 2.2 cm in short axis (axial image 6 of series 2). No additional mediastinal or hilar lymphadenopathy noted. Esophagus is unremarkable in appearance. Deep axillary and subpectoral lymphadenopathy is noted bilaterally, measuring up to 1.5 cm in short axis on the left (axial image 7 of series 2) and 1.4 cm in short axis on the right (axial image 11 of series 2). Lungs/Pleura: 3 mm right middle lobe pulmonary nodule (axial image 91 of series 4), stable compared to the prior study, considered benign. No other larger more suspicious appearing pulmonary nodules or masses are noted. Scattered areas of mild linear scarring in the lung bases bilaterally. No acute consolidative airspace disease. No pleural effusions. Musculoskeletal: There are no aggressive appearing lytic or blastic lesions noted in the visualized portions of the skeleton. Median sternotomy wires. CT ABDOMEN PELVIS FINDINGS Hepatobiliary: 2.5 x 1.9 cm low-attenuation lesion in segment 6 of the liver (axial image 61 of series 2), unchanged. Other subcentimeter low-attenuation lesions in the left lobe of the  liver also unchanged but too small to characterize, statistically likely to represent tiny cysts. No new suspicious appearing hepatic lesions are noted. No intra or extrahepatic biliary ductal dilatation. Gallbladder is normal in appearance. Pancreas: No pancreatic mass. No pancreatic ductal dilatation. No pancreatic or peripancreatic fluid collections or inflammatory changes. Spleen: Unremarkable. Adrenals/Urinary Tract: Low-attenuation lesions in both kidneys, compatible with simple cysts, largest of which is exophytic extending from  the posterior aspect of the upper pole of the left kidney measuring 7.8 x 6.3 cm. No suspicious renal lesions. Bilateral adrenal glands are normal in appearance. No hydroureteronephrosis. Urinary bladder is nearly decompressed, but otherwise unremarkable in appearance. Stomach/Bowel: The appearance of the stomach is normal. No pathologic dilatation of small bowel or colon. Numerous colonic diverticulae are noted, particularly in the sigmoid colon, without surrounding inflammatory changes to suggest an acute diverticulitis at this time. Normal appendix. Vascular/Lymphatic: Aortic atherosclerosis, without evidence of aneurysm or dissection in the abdominal or pelvic vasculature. Extensive retroperitoneal lymphadenopathy is again noted, progressed compared to the prior examination, with the bulkiest conglomerate nodal mass in the left para-aortic nodal station (axial image 72 of series 2), currently measuring 5.5 x 3.4 cm. Numerous enlarged mesenteric lymph nodes are also noted, largest of which is in the left side of the abdomen (axial image 73 of series 2) measuring 2.1 cm in short axis. No definite pelvic lymphadenopathy. Reproductive: Fiducial markers are noted in and adjacent to the prostate gland which appears mildly enlarged, with some median lobe hypertrophy. Seminal vesicles are unremarkable in appearance. Other: No significant volume of ascites.  No pneumoperitoneum. Musculoskeletal: There are no aggressive appearing lytic or blastic lesions noted in the visualized portions of the skeleton. IMPRESSION: 1. Extensive progressive lymphadenopathy in the retroperitoneum and small bowel mesentery, now also noted in the superior mediastinum and bilateral axillary/subpectoral regions. Although findings could be indicative of metastatic disease from primary such as prostate cancer, given the lack of pelvic lymphadenopathy and the unusual distribution of lymphadenopathy in this case, the possibility of a  lymphoproliferative disorder such as leukemia or lymphoma should also be considered. 2. Colonic diverticulosis without evidence of acute diverticulitis at this time. 3. There are calcifications of the aortic valve. Echocardiographic correlation for evaluation of potential valvular dysfunction may be warranted if clinically indicated. 4. Aortic atherosclerosis, in addition to left main and three-vessel coronary artery disease. Status post median sternotomy for CABG including LIMA to the LAD. Electronically Signed   By: Vinnie Langton M.D.   On: 06/20/2021 10:50    LABORATORY DATA:  I have reviewed the data as listed Lab Results  Component Value Date   WBC 18.2 (H) 06/07/2021   HGB 12.7 (L) 06/07/2021   HCT 39.2 06/07/2021   MCV 92.5 06/07/2021   PLT 230 06/07/2021   Recent Labs    06/07/21 1412  NA 134*  K 5.0  CL 102  CO2 24  GLUCOSE 98  BUN 27*  CREATININE 1.01  CALCIUM 9.5  GFRNONAA >60  PROT 6.6  ALBUMIN 4.0  AST 26  ALT 7  ALKPHOS 59  BILITOT 0.7    Iron/TIBC/Ferritin/ %Sat    Component Value Date/Time   IRON 92 06/07/2021 1412   TIBC 350 06/07/2021 1412   FERRITIN 68 06/07/2021 1412   IRONPCTSAT 26 06/07/2021 1412         ASSESSMENT & PLAN:  1. Lymphocytosis   2. Lymphadenopathy   3. Normocytic anemia   4. History of prostate cancer    Extensive lymphadenopathy  in the retroperitoneum, small bowel mesentery, superior mediastinum and bilateral axillary/subpectoral regions.  Concerning for lymphoma. Labs are reviewed and discussed with patient.  CT scan was reviewed and discussed with patient. 06/07/2021, peripheral blood flow cytometry showed CD5+ B-cell lymphoproliferative disorder.  CD5+ CD23+ clonal B-cell population, CLL/SLL phenotype, CD38 positive, 21% of leukocyte, less than 5000/ul  Normal LDH, negative hepatitis panel, HIV.  Unmutated I GVH  Discussed with patient that I suspect the patient has a lymphoma.  Metastatic prostate cancer is less  likely.  PSA is within normal limits.   I recommend patient to have PET scan for evaluation.  Depending on PET scan, will decide most accessible area that represent patient's disease to be biopsied.  Core needle biopsy versus excisional lymph node biopsy. Recommend patient to have bone marrow biopsy   Anemia, normal iron tibc ferritin, folate, B12. Check BM biopsy  History of PSA, normal PSA   Orders Placed This Encounter  Procedures   NM PET Image Initial (PI) Skull Base To Thigh    Standing Status:   Future    Standing Expiration Date:   06/27/2022    Order Specific Question:   If indicated for the ordered procedure, I authorize the administration of a radiopharmaceutical per Radiology protocol    Answer:   Yes    Order Specific Question:   Preferred imaging location?    Answer:   [facility]   CT BONE MARROW BIOPSY & ASPIRATION    Standing Status:   Future    Standing Expiration Date:   06/27/2022    Order Specific Question:   Reason for Exam (SYMPTOM  OR DIAGNOSIS REQUIRED)    Answer:   diffuse lymphocytosis    Order Specific Question:   Preferred location?    Answer:   [facility]    Order Specific Question:   Radiology Contrast Protocol - do NOT remove file path    Answer:   [email or web address]    All questions were answered. The patient knows to call the clinic with any problems questions or concerns.  Return of visit: TBD Thank you for this kind referral and the opportunity to participate in the care of this patient. A copy of today's note is routed to referring provider   Earlie Server, MD, PhD  06/27/2021

## 2021-07-01 ENCOUNTER — Other Ambulatory Visit: Payer: Self-pay

## 2021-07-01 ENCOUNTER — Ambulatory Visit
Admission: RE | Admit: 2021-07-01 | Discharge: 2021-07-01 | Disposition: A | Discharge status: Home / Self Care | Payer: Medicare Other | Source: Ambulatory Visit | Attending: Oncology | Admitting: Oncology

## 2021-07-01 DIAGNOSIS — Z8546 Personal history of malignant neoplasm of prostate: Secondary | ICD-10-CM | POA: Diagnosis not present

## 2021-07-01 DIAGNOSIS — D72820 Lymphocytosis (symptomatic): Secondary | ICD-10-CM | POA: Diagnosis not present

## 2021-07-01 DIAGNOSIS — I2510 Atherosclerotic heart disease of native coronary artery without angina pectoris: Secondary | ICD-10-CM | POA: Diagnosis not present

## 2021-07-01 DIAGNOSIS — R590 Localized enlarged lymph nodes: Secondary | ICD-10-CM | POA: Insufficient documentation

## 2021-07-01 DIAGNOSIS — I700 Atherosclerosis of aorta: Secondary | ICD-10-CM | POA: Diagnosis not present

## 2021-07-01 LAB — GLUCOSE, CAPILLARY: Glucose-Capillary: 90 mg/dL (ref 70–99)

## 2021-07-01 MED ORDER — FLUDEOXYGLUCOSE F - 18 (FDG) INJECTION
9.0000 | Freq: Once | INTRAVENOUS | Status: AC | PRN
  Administered 2021-07-01: 9.5 via INTRAVENOUS

## 2021-07-03 ENCOUNTER — Other Ambulatory Visit: Payer: Self-pay

## 2021-07-03 ENCOUNTER — Other Ambulatory Visit: Payer: Self-pay | Admitting: Radiology

## 2021-07-03 ENCOUNTER — Ambulatory Visit: Payer: Self-pay | Admitting: General Surgery

## 2021-07-03 DIAGNOSIS — R591 Generalized enlarged lymph nodes: Secondary | ICD-10-CM

## 2021-07-03 NOTE — H&P (View-Only) (Signed)
PATIENT PROFILE: Wesley [name] is a 81 y.o. male who presents to the Clinic for consultation at the request of Dr. Tasia Catchings for evaluation of lymph node biopsy.  PCP:  Cheryll Cockayne, MD  HISTORY OF PRESENT ILLNESS: [name] reports he was referred to oncology for evaluation of leukocytosis.  He has been having white blood cell count in the 20s.  Upon complete work-up he had PET scan that shows lymphadenopathy of the mediastinum, chest, intra-abdominal, among others.  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  He was refer to surgery for possible excisional biopsy of accessible lymph node.   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  CLL profile reveals a monoclonal B cell population with lambda light chain restriction; hematology consultation 10/17 with Dr. Alen Blew.  Recommendation for monitoring CBC; no intervention indicated.       History  of prostate cancer 11/20/2015   Overview    Diagnosed 5/17.  Followed by Dr. Karsten Ro.  Status post radiation therapy      Synovitis and tenosynovitis, unspecified Unknown   Essential hypertension, benign Unknown   GERD (gastroesophageal reflux disease) Unknown   Mixed hyperlipidemia Unknown   CAD (coronary artery disease) Unknown   Overview    status post coronary artery bypass surgery 2005. Previously followed by Dr. Rockey Situ, [facility].  Myoview negative for ischemia, 06/24/12. Changed to  [facility] 1/14.  Evaluated by Dr. Clayborn Bigness, 2017.  Myoview 6/17 with LVEF 45-50%, without evidence of ischemia.      History of stroke Unknown   Overview    Hospitalized 4/11 at [facility] with TIA symptoms of right facial numbness, slurred speech.  MRI with chronic left cerebellar infarct, echo with no thrombus/LVEF 55%, carotids without significant stenosis.      Vitamin D deficiency Unknown   Chronic back pain Unknown   Overview     with prior Neurosurgery and Pain Clinic evaluations. Reports MRI revealing disc disease L3 through L5. Followed at [facility].        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  Positive for weight loss (8 pounds) and fatigue Allergy and Immunology ROS: negative for - hives  Hematological and Lymphatic ROS: negative for - bleeding problems or bruising, negative for palpable  nodes Endocrine ROS: negative for - heat or cold intolerance, hair changes Respiratory ROS: negative for - cough, shortness of breath or wheezing Cardiovascular ROS: no chest pain or palpitations GI ROS: negative for nausea, vomiting, abdominal pain, diarrhea, constipation Musculoskeletal ROS: negative for - joint swelling or muscle pain Neurological ROS: negative for - confusion, syncope Dermatological ROS: negative for pruritus and rash Psychiatric:  negative for anxiety, depression, difficulty sleeping and memory loss  MEDICATIONS: Current Outpatient Medications  Medication Sig Dispense Refill   aspirin 81 MG EC tablet TAKE 1 TABLET(81 MG) BY MOUTH EVERY DAY 60 tablet 5   atorvastatin (LIPITOR) 80 MG tablet TAKE 1 TABLET(80 MG) BY MOUTH EVERY NIGHT 90 tablet 3   bimatoprost (LUMIGAN) 0.01 % ophthalmic solution Place 1 drop into both eyes nightly.     carbidopa-levodopa (SINEMET) 25-100 mg tablet Take 2 tablets by mouth 4 (four) times daily for 90 days (Patient taking differently: Take 2 tablets by mouth 3 (three) times daily) 720 tablet 3   carvediloL (COREG) 6.25 MG tablet TAKE 1 TABLET(6.25 MG) BY MOUTH TWICE DAILY WITH MEALS 60 tablet 11   co-enzyme Q-10, ubiquinone, 200 mg capsule Take 200 mg by mouth once daily.     CRANBERRY EXTRACT (CRANBERRY ORAL) Take 84 mg by mouth One daily     cyanocobalamin, vitamin B-12, 1,000 mcg TbER Take 1 tablet by mouth once daily       diphenhydrAMINE (BENADRYL) 25 mg tablet Take 25 mg by mouth nightly as needed       DOCOSAHEXANOIC ACID/EPA (FISH OIL ORAL) Take by mouth Flax & borage oil with Omega 3-6-9 ($RemoveBefo'400mg'SVGlOUrLWPt$ ) One daily     DOCOSAHEXANOIC ACID/VIT C/LUT (EYE HEALTH ORAL) Take by mouth Allreds 2 vitamin one daily     docusate calcium (SURFAK) 240 mg capsule Take 240 mg by mouth 2 (two) times daily as needed        ENTRESTO 24-26 mg tablet TAKE 1 TABLET BY MOUTH EVERY 12 HOURS 60 tablet 11   esomeprazole (NEXIUM) 40 MG DR capsule Nexium 40 mg capsule,delayed release     famotidine (PEPCID) 20 MG tablet Take 20 mg by mouth 2 (two) times daily     FOLIC ACID/MULTIVIT-MIN/LUTEIN (CENTRUM SILVER ORAL) Take by mouth One daily     FUROsemide (LASIX) 20 MG tablet Take 1 tablet (20 mg total) by mouth once daily as needed (for weight gain greater than 2 lbs/day  or edema) 30 tablet 11   GINKGO BILOBA ORAL Take 1 caplet by mouth once daily       glucosamine sulfate 1,000 mg Cap Take 1 capsule by mouth once  daily. $Remov'1500mg'kTqDZK$  one daily     levOCARNitine tartrate 500 mg Cap Take 1 capsule by mouth once daily        magnesium oxide (MAG-OX) 400 mg tablet Take 400 mg by mouth once daily. With chelated zinc $RemoveBefor'15mg'NOlzmCPhqjPv$  one daily     meloxicam (MOBIC) 15 MG tablet TK 1 T PO QD     pantoprazole (PROTONIX) 40 MG DR tablet TAKE 1 TABLET BY MOUTH EVERY DAY 90 tablet 2   selenium 200 mcg tablet Take 200 mcg by mouth once daily       spironolactone (ALDACTONE) 25 MG tablet TAKE 1/2 TABLET(12.5 MG) BY MOUTH EVERY DAY 15 tablet 11   tamsulosin (FLOMAX) 0.4 mg capsule Take 0.4 mg by mouth once daily Take 30 minutes after same meal each day.     tetrahydrozoline (VISINE) 0.05 % ophthalmic solution Apply  to eye     traMADoL (ULTRAM) 50 mg tablet Take 1 tablet (50 mg total) by mouth every 6 (six) hours as needed for Pain 120 tablet 5   vitamins  A,C,E-zinc-copper 14,320-226-200 unit-mg-unit Take 1 capsule by mouth once daily       No current facility-administered medications for this visit.    ALLERGIES: Patient has no known allergies.  PAST MEDICAL HISTORY: Past Medical History:  Diagnosis Date   Allergic rhinitis    CAD (coronary artery disease)    status post coronary artery bypass surgery 2005. LIMA to DX1, RIMA to RCA, SVG to distal LAD, SVG to Cfx marginal (Dr. Remus Loffler)   Cancer of prostate (CMS-HCC)    To begin radiation therapy 02/2016   Cerebellar infarct (CMS-HCC)    Hospitalized 4/11 at [facility] with TIA symptoms of right facial numbness, slurred speech.  MRI with chronic left cerebellar infarct, echo with no thrombus/LVEF 55%, carotids without significant stenosis.   Chronic back pain     with prior Neurosurgery and Pain Clinic evaluations. Reports MRI revealing disc disease L3 through L5. Followed at [facility].     Chronic systolic CHF (congestive heart failure) (CMS-HCC) 10/02/2019   LVEF 35% by echo 9/20; followed by Cardiology   Coronary atherosclerosis of autologous vein bypass graft     Double vision    Right eye   Essential hypertension, benign    Fracture of left shoulder    with labrum tear, followed by Dr. Shaune Leeks   GERD (gastroesophageal reflux disease)    History of elevated PSA    with prior biopsies, followed by Dr. Kathie Rhodes, [facility] in [place].   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   Status post coronary artery bypass surgery   2005   Left total knee arthroplasty using computer-assisted navigation  11/08/2012   Dr Marry Guan   Right total knee arthroplasty using computer-assisted navigation  05/17/2018   Dr Marry Guan   Attempted manipulation of the right knee under anesthesia, right knee arthroscopy and lysis of adhesions  01/05/2019   Dr Marry Guan   CARPAL TUNNEL RELEASE     CATARACT EXTRACTION Bilateral    MUSCLE BIOPSY       FAMILY HISTORY: Family History  Problem Relation Age of Onset   Heart valve disease Mother        died after heart valve surgery   Parkinsonism Father    Dementia Father    Alzheimer's disease Father      SOCIAL HISTORY: Social History   Socioeconomic History   Marital status: Divorced   Number of children: 3   Years of education: 45   Highest education level: Professional school degree (e.g., MD, DDS, DVM, JD)  Occupational History   Occupation: Full-time - Forensic psychologist  Tobacco Use   Smoking status: Former    Types: Cigarettes   Smokeless tobacco: Never    Tobacco comments:  only smoked x1y in college  Vaping Use   Vaping Use: Never used  Substance and Sexual Activity   Alcohol use: Yes    Alcohol/week: 2.0 standard drinks    Types: 2 Cans of beer per week    Comment: 1-2 Beers a week   Drug use: Defer   Sexual activity: Defer    Partners: Female    PHYSICAL EXAM: Vitals:   07/03/21 1620  BP: 102/59  Pulse: 66   Body mass index is 28.96 kg/m. Weight: 78.9 kg (174 lb)   GENERAL: Alert, active, oriented x3  HEENT: Pupils equal reactive to light. Extraocular movements are intact. Sclera clear. Palpebral conjunctiva normal red color.Pharynx clear.  NECK: Supple with no palpable mass and no adenopathy.  There is palpable adenopathy under the left clavicle.  LUNGS: Sound clear with no rales rhonchi or wheezes.  HEART: Regular rhythm S1 and S2 without murmur.  ABDOMEN: Soft and depressible, nontender with no palpable mass, no hepatomegaly.  EXTREMITIES: Well-developed well-nourished symmetrical with no dependent edema.  NEUROLOGICAL: Awake alert oriented, facial expression symmetrical, moving all extremities.  REVIEW OF DATA: I have reviewed the following data today: Office Visit on 05/21/2021  Component Date Value   WBC (White Blood Cell Co* 05/21/2021 20.1 (H)    RBC (Red Blood Cell Coun* 05/21/2021 4.20 (L)    Hemoglobin 05/21/2021 12.5 (L)    Hematocrit 05/21/2021 39.2 (L)    MCV (Mean Corpuscular Vo* 05/21/2021 93.3    MCH (Mean Corpuscular He* 05/21/2021 29.8    MCHC (Mean Corpuscular H* 05/21/2021 31.9 (L)    Platelet Count 05/21/2021 238    RDW-CV (Red Cell Distrib* 05/21/2021 13.1    MPV (Mean Platelet Volum* 05/21/2021 9.7    Neutrophils 05/21/2021 3.96    Lymphocytes 05/21/2021 13.76 (H)    Monocytes 05/21/2021 1.88 (H)    Eosinophils 05/21/2021 0.39    Basophils 05/21/2021 0.07    Neutrophil % 05/21/2021 19.8 (L)    Lymphocyte % 05/21/2021 68.5 (H)    Monocyte % 05/21/2021 9.4    Eosinophil %  05/21/2021 1.9    Basophil% 05/21/2021 0.3    Immature Granulocyte % 05/21/2021 0.1    Immature Granulocyte Cou* 05/21/2021 0.03    Flow Interpretation - [facility]* 05/21/2021 Comment    Flow Comment - [facility] 05/21/2021 Comment    Clinical Information - L* 05/21/2021 Comment    Specimen Type - [facility] 05/21/2021 Comment    Assessment of Leukocytes* 05/21/2021 Comment    Viability - [facility] 05/21/2021 Comment    Immunophenotypic Profile* 05/21/2021 Comment    Analysis and Gating Stra* 05/21/2021 Comment    Phenotype Chart - [facility] 05/21/2021 Comment    Resulting Path Name - [facility]* 05/21/2021 Comment    Comment: - [facility] 05/21/2021 Comment   Ancillary Orders on 05/13/2021  Component Date Value   Glucose 05/13/2021 92    Sodium 05/13/2021 140    Potassium 05/13/2021 5.2 (H)    Chloride 05/13/2021 108    Carbon Dioxide (CO2) 05/13/2021 29.3    Urea Nitrogen (BUN) 05/13/2021 23    Creatinine 05/13/2021 1.1    Glomerular Filtration Ra* 05/13/2021 64    Calcium 05/13/2021 9.8    AST  05/13/2021 28    ALT  05/13/2021 12    Alk Phos (alkaline Phosp* 05/13/2021 84    Albumin 05/13/2021 4.0    Bilirubin, Total 05/13/2021 0.5    Protein, Total 05/13/2021 5.9 (L)    A/G Ratio 05/13/2021 2.1  WBC (White Blood Cell Co* 05/13/2021 17.1 (H)    RBC (Red Blood Cell Coun* 05/13/2021 4.10 (L)    Hemoglobin 05/13/2021 12.4 (L)    Hematocrit 05/13/2021 38.6 (L)    MCV (Mean Corpuscular Vo* 05/13/2021 94.1    MCH (Mean Corpuscular He* 05/13/2021 30.2    MCHC (Mean Corpuscular H* 05/13/2021 32.1    Platelet Count 05/13/2021 219    RDW-CV (Red Cell Distrib* 05/13/2021 13.2    MPV (Mean Platelet Volum* 05/13/2021 10.0    Neutrophils 05/13/2021 3.80    Lymphocytes 05/13/2021 11.73 (H)    Monocytes 05/13/2021 1.16    Eosinophils 05/13/2021 0.35    Basophils 05/13/2021 0.07    Neutrophil % 05/13/2021 22.2 (L)    Lymphocyte % 05/13/2021 68.5 (H)    Monocyte % 05/13/2021 6.8    Eosinophil % 05/13/2021  2.0    Basophil% 05/13/2021 0.4    Immature Granulocyte % 05/13/2021 0.1    Immature Granulocyte Cou* 05/13/2021 0.02    Cholesterol, Total 05/13/2021 90 (L)    Triglyceride 05/13/2021 53    HDL (High Density Lipopr* 05/13/2021 36.5    LDL Calculated 05/13/2021 43    VLDL Cholesterol 05/13/2021 11    Cholesterol/HDL Ratio 05/13/2021 2.5    Creatinine, Random Urine 05/13/2021 123.5    Urine Albumin, Random 05/13/2021 15    Urine Albumin/Creatinine* 05/13/2021 12.1    Color 05/13/2021 Yellow    Clarity 05/13/2021 Clear    Specific Gravity 05/13/2021 >=1.030    pH, Urine 05/13/2021 6.0    Protein, Urinalysis 05/13/2021 Negative    Glucose, Urinalysis 05/13/2021 Negative    Ketones, Urinalysis 05/13/2021 Negative    Blood, Urinalysis 05/13/2021 Negative    Nitrite, Urinalysis 05/13/2021 Negative    Leukocyte Esterase, Urin* 05/13/2021 Negative    White Blood Cells, Urina* 05/13/2021 None Seen    Red Blood Cells, Urinaly* 05/13/2021 None Seen    Bacteria, Urinalysis 05/13/2021 None Seen    Squamous Epithelial Cell* 05/13/2021 None Seen    Thyroid Stimulating Horm* 05/13/2021 0.950    Segmented Neutrophil 05/13/2021 22    Band 05/13/2021 2    Lymphocyte 05/13/2021 45 (H)    Monocytes 05/13/2021 4    Eosinophil 05/13/2021 1    Basophils 05/13/2021 1    Variant Lymph 05/13/2021 25 (H)      ASSESSMENT: Mr. Humber is a 81 y.o. male presenting for consultation for evaluation of excisional biopsy of left chest/cervical.  Patient with multiple site of lymphadenopathy.  The most accessible lymph node is on the left cervical/chest area.  Labs and imaging are concerning for lymphoma.  I discussed with patient the need of excisional biopsy of the lymph node.  I discussed with patient the benefit versus risk.  Discussed with patient risk of bleeding, infection, scar tissue, pain, among other.  He endorses he understood and agreed to proceed.  Lymphadenopathy [R59.1]  PLAN: Left upper  chest excisional biopsy ([id number]) Hold aspirin 5 days before surgery Cardiology clearance Contact us if you have any concern.   Patient and his girlfriend verbalized understanding, all questions were answered, and were agreeable with the plan outlined above.   Herbert Pun, MD  Electronically signed by Herbert Pun, MD

## 2021-07-03 NOTE — H&P (Signed)
PATIENT PROFILE: Wesley [name] is a 81 y.o. male who presents to the Clinic for consultation at the request of Dr. Tasia Catchings for evaluation of lymph node biopsy.  PCP:  Cheryll Cockayne, MD  HISTORY OF PRESENT ILLNESS: Wesley [name] reports he was referred to oncology for evaluation of leukocytosis.  He has been having white blood cell count in the 20s.  Upon complete work-up he had PET scan that shows lymphadenopathy of the mediastinum, chest, intra-abdominal, among others.  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  He was refer to surgery for possible excisional biopsy of accessible lymph node.   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  CLL profile reveals a monoclonal B cell population with lambda light chain restriction; hematology consultation 10/17 with Dr. Alen Blew.  Recommendation for monitoring CBC; no intervention indicated.       History  of prostate cancer 11/20/2015   Overview    Diagnosed 5/17.  Followed by Dr. Karsten Ro.  Status post radiation therapy      Synovitis and tenosynovitis, unspecified Unknown   Essential hypertension, benign Unknown   GERD (gastroesophageal reflux disease) Unknown   Mixed hyperlipidemia Unknown   CAD (coronary artery disease) Unknown   Overview    status post coronary artery bypass surgery 2005. Previously followed by Dr. Rockey Situ, [facility].  Myoview negative for ischemia, 06/24/12. Changed to  [facility] 1/14.  Evaluated by Dr. Clayborn Bigness, 2017.  Myoview 6/17 with LVEF 45-50%, without evidence of ischemia.      History of stroke Unknown   Overview    Hospitalized 4/11 at [facility] with TIA symptoms of right facial numbness, slurred speech.  MRI with chronic left cerebellar infarct, echo with no thrombus/LVEF 55%, carotids without significant stenosis.      Vitamin D deficiency Unknown   Chronic back pain Unknown   Overview     with prior Neurosurgery and Pain Clinic evaluations. Reports MRI revealing disc disease L3 through L5. Followed at [facility].        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  Positive for weight loss (8 pounds) and fatigue Allergy and Immunology ROS: negative for - hives  Hematological and Lymphatic ROS: negative for - bleeding problems or bruising, negative for palpable  nodes Endocrine ROS: negative for - heat or cold intolerance, hair changes Respiratory ROS: negative for - cough, shortness of breath or wheezing Cardiovascular ROS: no chest pain or palpitations GI ROS: negative for nausea, vomiting, abdominal pain, diarrhea, constipation Musculoskeletal ROS: negative for - joint swelling or muscle pain Neurological ROS: negative for - confusion, syncope Dermatological ROS: negative for pruritus and rash Psychiatric:  negative for anxiety, depression, difficulty sleeping and memory loss  MEDICATIONS: Current Outpatient Medications  Medication Sig Dispense Refill   aspirin 81 MG EC tablet TAKE 1 TABLET(81 MG) BY MOUTH EVERY DAY 60 tablet 5   atorvastatin (LIPITOR) 80 MG tablet TAKE 1 TABLET(80 MG) BY MOUTH EVERY NIGHT 90 tablet 3   bimatoprost (LUMIGAN) 0.01 % ophthalmic solution Place 1 drop into both eyes nightly.     carbidopa-levodopa (SINEMET) 25-100 mg tablet Take 2 tablets by mouth 4 (four) times daily for 90 days (Patient taking differently: Take 2 tablets by mouth 3 (three) times daily) 720 tablet 3   carvediloL (COREG) 6.25 MG tablet TAKE 1 TABLET(6.25 MG) BY MOUTH TWICE DAILY WITH MEALS 60 tablet 11   co-enzyme Q-10, ubiquinone, 200 mg capsule Take 200 mg by mouth once daily.     CRANBERRY EXTRACT (CRANBERRY ORAL) Take 84 mg by mouth One daily     cyanocobalamin, vitamin B-12, 1,000 mcg TbER Take 1 tablet by mouth once daily       diphenhydrAMINE (BENADRYL) 25 mg tablet Take 25 mg by mouth nightly as needed       DOCOSAHEXANOIC ACID/EPA (FISH OIL ORAL) Take by mouth Flax & borage oil with Omega 3-6-9 ($RemoveBefo'400mg'MJZSNaQWkqZ$ ) One daily     DOCOSAHEXANOIC ACID/VIT C/LUT (EYE HEALTH ORAL) Take by mouth Allreds 2 vitamin one daily     docusate calcium (SURFAK) 240 mg capsule Take 240 mg by mouth 2 (two) times daily as needed        ENTRESTO 24-26 mg tablet TAKE 1 TABLET BY MOUTH EVERY 12 HOURS 60 tablet 11   esomeprazole (NEXIUM) 40 MG DR capsule Nexium 40 mg capsule,delayed release     famotidine (PEPCID) 20 MG tablet Take 20 mg by mouth 2 (two) times daily     FOLIC ACID/MULTIVIT-MIN/LUTEIN (CENTRUM SILVER ORAL) Take by mouth One daily     FUROsemide (LASIX) 20 MG tablet Take 1 tablet (20 mg total) by mouth once daily as needed (for weight gain greater than 2 lbs/day  or edema) 30 tablet 11   GINKGO BILOBA ORAL Take 1 caplet by mouth once daily       glucosamine sulfate 1,000 mg Cap Take 1 capsule by mouth once  daily. $Remov'1500mg'RAEnRj$  one daily     levOCARNitine tartrate 500 mg Cap Take 1 capsule by mouth once daily        magnesium oxide (MAG-OX) 400 mg tablet Take 400 mg by mouth once daily. With chelated zinc $RemoveBefor'15mg'AGrMuRzujYwA$  one daily     meloxicam (MOBIC) 15 MG tablet TK 1 T PO QD     pantoprazole (PROTONIX) 40 MG DR tablet TAKE 1 TABLET BY MOUTH EVERY DAY 90 tablet 2   selenium 200 mcg tablet Take 200 mcg by mouth once daily       spironolactone (ALDACTONE) 25 MG tablet TAKE 1/2 TABLET(12.5 MG) BY MOUTH EVERY DAY 15 tablet 11   tamsulosin (FLOMAX) 0.4 mg capsule Take 0.4 mg by mouth once daily Take 30 minutes after same meal each day.     tetrahydrozoline (VISINE) 0.05 % ophthalmic solution Apply  to eye     traMADoL (ULTRAM) 50 mg tablet Take 1 tablet (50 mg total) by mouth every 6 (six) hours as needed for Pain 120 tablet 5   vitamins  A,C,E-zinc-copper 14,320-226-200 unit-mg-unit Take 1 capsule by mouth once daily       No current facility-administered medications for this visit.    ALLERGIES: Patient has no known allergies.  PAST MEDICAL HISTORY: Past Medical History:  Diagnosis Date   Allergic rhinitis    CAD (coronary artery disease)    status post coronary artery bypass surgery 2005. LIMA to DX1, RIMA to RCA, SVG to distal LAD, SVG to Cfx marginal (Dr. Remus Loffler)   Cancer of prostate (CMS-HCC)    To begin radiation therapy 02/2016   Cerebellar infarct (CMS-HCC)    Hospitalized 4/11 at [facility] with TIA symptoms of right facial numbness, slurred speech.  MRI with chronic left cerebellar infarct, echo with no thrombus/LVEF 55%, carotids without significant stenosis.   Chronic back pain     with prior Neurosurgery and Pain Clinic evaluations. Reports MRI revealing disc disease L3 through L5. Followed at [facility].     Chronic systolic CHF (congestive heart failure) (CMS-HCC) 10/02/2019   LVEF 35% by echo 9/20; followed by Cardiology   Coronary atherosclerosis of autologous vein bypass graft     Double vision    Right eye   Essential hypertension, benign    Fracture of left shoulder    with labrum tear, followed by Dr. Shaune Leeks   GERD (gastroesophageal reflux disease)    History of elevated PSA    with prior biopsies, followed by Dr. Kathie Rhodes, [facility] in [place].   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   Status post coronary artery bypass surgery   2005   Left total knee arthroplasty using computer-assisted navigation  11/08/2012   Dr Marry Guan   Right total knee arthroplasty using computer-assisted navigation  05/17/2018   Dr Marry Guan   Attempted manipulation of the right knee under anesthesia, right knee arthroscopy and lysis of adhesions  01/05/2019   Dr Marry Guan   CARPAL TUNNEL RELEASE     CATARACT EXTRACTION Bilateral    MUSCLE BIOPSY       FAMILY HISTORY: Family History  Problem Relation Age of Onset   Heart valve disease Mother        died after heart valve surgery   Parkinsonism Father    Dementia Father    Alzheimer's disease Father      SOCIAL HISTORY: Social History   Socioeconomic History   Marital status: Divorced   Number of children: 3   Years of education: 45   Highest education level: Professional school degree (e.g., MD, DDS, DVM, JD)  Occupational History   Occupation: Full-time - Forensic psychologist  Tobacco Use   Smoking status: Former    Types: Cigarettes   Smokeless tobacco: Never    Tobacco comments:  only smoked x1y in college  Vaping Use   Vaping Use: Never used  Substance and Sexual Activity   Alcohol use: Yes    Alcohol/week: 2.0 standard drinks    Types: 2 Cans of beer per week    Comment: 1-2 Beers a week   Drug use: Defer   Sexual activity: Defer    Partners: Female    PHYSICAL EXAM: Vitals:   07/03/21 1620  BP: 102/59  Pulse: 66   Body mass index is 28.96 kg/m. Weight: 78.9 kg (174 lb)   GENERAL: Alert, active, oriented x3  HEENT: Pupils equal reactive to light. Extraocular movements are intact. Sclera clear. Palpebral conjunctiva normal red color.Pharynx clear.  NECK: Supple with no palpable mass and no adenopathy.  There is palpable adenopathy under the left clavicle.  LUNGS: Sound clear with no rales rhonchi or wheezes.  HEART: Regular rhythm S1 and S2 without murmur.  ABDOMEN: Soft and depressible, nontender with no palpable mass, no hepatomegaly.  EXTREMITIES: Well-developed well-nourished symmetrical with no dependent edema.  NEUROLOGICAL: Awake alert oriented, facial expression symmetrical, moving all extremities.  REVIEW OF DATA: I have reviewed the following data today: Office Visit on 05/21/2021  Component Date Value   WBC (White Blood Cell Co* 05/21/2021 20.1 (H)    RBC (Red Blood Cell Coun* 05/21/2021 4.20 (L)    Hemoglobin 05/21/2021 12.5 (L)    Hematocrit 05/21/2021 39.2 (L)    MCV (Mean Corpuscular Vo* 05/21/2021 93.3    MCH (Mean Corpuscular He* 05/21/2021 29.8    MCHC (Mean Corpuscular H* 05/21/2021 31.9 (L)    Platelet Count 05/21/2021 238    RDW-CV (Red Cell Distrib* 05/21/2021 13.1    MPV (Mean Platelet Volum* 05/21/2021 9.7    Neutrophils 05/21/2021 3.96    Lymphocytes 05/21/2021 13.76 (H)    Monocytes 05/21/2021 1.88 (H)    Eosinophils 05/21/2021 0.39    Basophils 05/21/2021 0.07    Neutrophil % 05/21/2021 19.8 (L)    Lymphocyte % 05/21/2021 68.5 (H)    Monocyte % 05/21/2021 9.4    Eosinophil %  05/21/2021 1.9    Basophil% 05/21/2021 0.3    Immature Granulocyte % 05/21/2021 0.1    Immature Granulocyte Cou* 05/21/2021 0.03    Flow Interpretation - [facility]* 05/21/2021 Comment    Flow Comment - [facility] 05/21/2021 Comment    Clinical Information - L* 05/21/2021 Comment    Specimen Type - [facility] 05/21/2021 Comment    Assessment of Leukocytes* 05/21/2021 Comment    Viability - [facility] 05/21/2021 Comment    Immunophenotypic Profile* 05/21/2021 Comment    Analysis and Gating Stra* 05/21/2021 Comment    Phenotype Chart - [facility] 05/21/2021 Comment    Resulting Path Name - [facility]* 05/21/2021 Comment    Comment: - [facility] 05/21/2021 Comment   Ancillary Orders on 05/13/2021  Component Date Value   Glucose 05/13/2021 92    Sodium 05/13/2021 140    Potassium 05/13/2021 5.2 (H)    Chloride 05/13/2021 108    Carbon Dioxide (CO2) 05/13/2021 29.3    Urea Nitrogen (BUN) 05/13/2021 23    Creatinine 05/13/2021 1.1    Glomerular Filtration Ra* 05/13/2021 64    Calcium 05/13/2021 9.8    AST  05/13/2021 28    ALT  05/13/2021 12    Alk Phos (alkaline Phosp* 05/13/2021 84    Albumin 05/13/2021 4.0    Bilirubin, Total 05/13/2021 0.5    Protein, Total 05/13/2021 5.9 (L)    A/G Ratio 05/13/2021 2.1  WBC (White Blood Cell Co* 05/13/2021 17.1 (H)    RBC (Red Blood Cell Coun* 05/13/2021 4.10 (L)    Hemoglobin 05/13/2021 12.4 (L)    Hematocrit 05/13/2021 38.6 (L)    MCV (Mean Corpuscular Vo* 05/13/2021 94.1    MCH (Mean Corpuscular He* 05/13/2021 30.2    MCHC (Mean Corpuscular H* 05/13/2021 32.1    Platelet Count 05/13/2021 219    RDW-CV (Red Cell Distrib* 05/13/2021 13.2    MPV (Mean Platelet Volum* 05/13/2021 10.0    Neutrophils 05/13/2021 3.80    Lymphocytes 05/13/2021 11.73 (H)    Monocytes 05/13/2021 1.16    Eosinophils 05/13/2021 0.35    Basophils 05/13/2021 0.07    Neutrophil % 05/13/2021 22.2 (L)    Lymphocyte % 05/13/2021 68.5 (H)    Monocyte % 05/13/2021 6.8    Eosinophil % 05/13/2021  2.0    Basophil% 05/13/2021 0.4    Immature Granulocyte % 05/13/2021 0.1    Immature Granulocyte Cou* 05/13/2021 0.02    Cholesterol, Total 05/13/2021 90 (L)    Triglyceride 05/13/2021 53    HDL (High Density Lipopr* 05/13/2021 36.5    LDL Calculated 05/13/2021 43    VLDL Cholesterol 05/13/2021 11    Cholesterol/HDL Ratio 05/13/2021 2.5    Creatinine, Random Urine 05/13/2021 123.5    Urine Albumin, Random 05/13/2021 15    Urine Albumin/Creatinine* 05/13/2021 12.1    Color 05/13/2021 Yellow    Clarity 05/13/2021 Clear    Specific Gravity 05/13/2021 >=1.030    pH, Urine 05/13/2021 6.0    Protein, Urinalysis 05/13/2021 Negative    Glucose, Urinalysis 05/13/2021 Negative    Ketones, Urinalysis 05/13/2021 Negative    Blood, Urinalysis 05/13/2021 Negative    Nitrite, Urinalysis 05/13/2021 Negative    Leukocyte Esterase, Urin* 05/13/2021 Negative    White Blood Cells, Urina* 05/13/2021 None Seen    Red Blood Cells, Urinaly* 05/13/2021 None Seen    Bacteria, Urinalysis 05/13/2021 None Seen    Squamous Epithelial Cell* 05/13/2021 None Seen    Thyroid Stimulating Horm* 05/13/2021 0.950    Segmented Neutrophil 05/13/2021 22    Band 05/13/2021 2    Lymphocyte 05/13/2021 45 (H)    Monocytes 05/13/2021 4    Eosinophil 05/13/2021 1    Basophils 05/13/2021 1    Variant Lymph 05/13/2021 25 (H)      ASSESSMENT: Mr. Cotroneo is a 81 y.o. male presenting for consultation for evaluation of excisional biopsy of left chest/cervical.  Patient with multiple site of lymphadenopathy.  The most accessible lymph node is on the left cervical/chest area.  Labs and imaging are concerning for lymphoma.  I discussed with patient the need of excisional biopsy of the lymph node.  I discussed with patient the benefit versus risk.  Discussed with patient risk of bleeding, infection, scar tissue, pain, among other.  He endorses he understood and agreed to proceed.  Lymphadenopathy [R59.1]  PLAN: Left upper  chest excisional biopsy (80998) Hold aspirin 5 days before surgery Cardiology clearance Contact us if you have any concern.   Patient and his girlfriend verbalized understanding, all questions were answered, and were agreeable with the plan outlined above.   Herbert Pun, MD  Electronically signed by Herbert Pun, MD

## 2021-07-05 ENCOUNTER — Encounter
Admission: RE | Admit: 2021-07-05 | Discharge: 2021-07-05 | Disposition: A | Discharge status: Home / Self Care | Payer: Medicare Other | Source: Ambulatory Visit | Attending: General Surgery | Admitting: General Surgery

## 2021-07-05 ENCOUNTER — Other Ambulatory Visit: Payer: Self-pay

## 2021-07-05 ENCOUNTER — Ambulatory Visit
Admission: RE | Admit: 2021-07-05 | Discharge: 2021-07-05 | Disposition: A | Discharge status: Home / Self Care | Payer: Medicare Other | Source: Ambulatory Visit | Attending: Oncology | Admitting: Oncology

## 2021-07-05 DIAGNOSIS — C9110 Chronic lymphocytic leukemia of B-cell type not having achieved remission: Secondary | ICD-10-CM | POA: Diagnosis not present

## 2021-07-05 DIAGNOSIS — Z85830 Personal history of malignant neoplasm of bone: Secondary | ICD-10-CM | POA: Insufficient documentation

## 2021-07-05 DIAGNOSIS — I10 Essential (primary) hypertension: Secondary | ICD-10-CM | POA: Insufficient documentation

## 2021-07-05 DIAGNOSIS — Z8546 Personal history of malignant neoplasm of prostate: Secondary | ICD-10-CM | POA: Diagnosis not present

## 2021-07-05 DIAGNOSIS — D72820 Lymphocytosis (symptomatic): Secondary | ICD-10-CM

## 2021-07-05 LAB — CBC WITH DIFFERENTIAL/PLATELET
Abs Immature Granulocytes: 0.04 10*3/uL (ref 0.00–0.07)
Basophils Absolute: 0.1 10*3/uL (ref 0.0–0.1)
Basophils Relative: 1 %
Eosinophils Absolute: 0.6 10*3/uL — ABNORMAL HIGH (ref 0.0–0.5)
Eosinophils Relative: 2 %
HCT: 38.7 % — ABNORMAL LOW (ref 39.0–52.0)
Hemoglobin: 12.5 g/dL — ABNORMAL LOW (ref 13.0–17.0)
Immature Granulocytes: 0 %
Lymphocytes Relative: 67 %
Lymphs Abs: 16.5 10*3/uL — ABNORMAL HIGH (ref 0.7–4.0)
MCH: 29.6 pg (ref 26.0–34.0)
MCHC: 32.3 g/dL (ref 30.0–36.0)
MCV: 91.7 fL (ref 80.0–100.0)
Monocytes Absolute: 2.4 10*3/uL — ABNORMAL HIGH (ref 0.1–1.0)
Monocytes Relative: 10 %
Neutro Abs: 5 10*3/uL (ref 1.7–7.7)
Neutrophils Relative %: 20 %
Platelets: 243 10*3/uL (ref 150–400)
RBC: 4.22 MIL/uL (ref 4.22–5.81)
RDW: 13 % (ref 11.5–15.5)
Smear Review: NORMAL
WBC: 24.5 10*3/uL — ABNORMAL HIGH (ref 4.0–10.5)
nRBC: 0 % (ref 0.0–0.2)

## 2021-07-05 LAB — PATHOLOGIST SMEAR REVIEW

## 2021-07-05 MED ORDER — SODIUM CHLORIDE 0.9 % IV SOLN: INTRAVENOUS | Status: DC

## 2021-07-05 MED ORDER — FENTANYL CITRATE (PF) 100 MCG/2ML IJ SOLN
INTRAMUSCULAR | Status: AC | PRN
  Administered 2021-07-05: 50 ug via INTRAVENOUS

## 2021-07-05 MED ORDER — FENTANYL CITRATE (PF) 100 MCG/2ML IJ SOLN
INTRAMUSCULAR | Status: AC
  Filled 2021-07-05: qty 2

## 2021-07-05 MED ORDER — MIDAZOLAM HCL 2 MG/2ML IJ SOLN
INTRAMUSCULAR | Status: AC | PRN
Start: 2021-07-05 — End: 2021-07-05
  Administered 2021-07-05: 1 mg via INTRAVENOUS

## 2021-07-05 MED ORDER — MIDAZOLAM HCL 2 MG/2ML IJ SOLN
INTRAMUSCULAR | Status: AC
  Filled 2021-07-05: qty 2

## 2021-07-05 MED ORDER — HEPARIN SOD (PORK) LOCK FLUSH 100 UNIT/ML IV SOLN
INTRAVENOUS | Status: AC
  Filled 2021-07-05: qty 5

## 2021-07-05 NOTE — Procedures (Signed)
Interventional Radiology Procedure Note  Date of Procedure: 07/05/2021  Procedure: BMBx  Findings:  1. CT BMBx right ilium    Complications: No immediate complications noted.   Estimated Blood Loss: minimal  Follow-up and Recommendations: 1. Bedrest 1 hour    Albin Felling, MD  Vascular & Interventional Radiology  07/05/2021 10:18 AM

## 2021-07-05 NOTE — Progress Notes (Signed)
Patient clinically stable post BMB per Dr Denna Haggard, tolerated well with Fentanyl 50 mcg IV along with Versed 1 mg IV for procedure. Denies complaints presently. Report given to [facility] RN post procedure/recovery. Called and gave update to [place] with questions answered post procedure.

## 2021-07-05 NOTE — H&P (Signed)
Chief Complaint: Diffuse lymphocytosis  Referring Physician(s): Earlie Server  Supervising Physician: Juliet Rude  Patient Status: [facility] - Out-pt  History of Present Illness: Wesley [name] is a 81 y.o. male who was seen by Dr. Tasia Catchings for evaluation of leukocytosis.  CBC in November showed WBC 20, predominantly lymphocytosis and neutrophilia.  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  He is here today for bone marrow biopsy.  He is NPO. No nausea/vomiting. No Fever/chills. ROS negative.   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 sleep study 06/03/2012 - AHI 54.74/hr during total sleep, AHI 49.66/hr during REM   Prostate cancer ([place]) 10/2015   radiation  followed by Dr. Karsten Ro   S/P CABG x 4 2005   Stroke [facility]) 2011   damage to back of brain, difficulty when turning with ambulation   Synovitis and tenosynovitis, unspecified    Thoracic aortic aneurysm without rupture 10/2019   4.4 cm by CA 2021   Tubular adenoma of colon    Unspecified vitamin D deficiency     Past Surgical History:  Procedure Laterality Date   CARDIAC CATHETERIZATION  02/01/2004   L main with 60% prox stenosis, LAD calcified in prox section with 95% ostial lesion, diffusion 50-60% disease in mid  LAD with focal 90% mid LAD stenosis; 1st diagonal coarsely irregular with up to 30% stenosis in prox & mid segments; L Cfx with 3 OMs, AV groove Cfx with 40-50% stenosis; 3rd OM with 50% prox stenosis; RCA with PDA & PLA, PLA with 95% osital lesion (Dr. Gerrie Nordmann)   CARPAL TUNNEL RELEASE     CATARACT EXTRACTION, BILATERAL     COLONOSCOPY     CORONARY ARTERY BYPASS GRAFT  02/02/2004   LIMA to DX1, RIMA to RCA, SVG to distal LAD, SVG to Cfx marginal (Dr. Remus Loffler)   EYE SURGERY Bilateral    cataract extractions   gold seed marker placement/prostate  02/28/2016   HEMORRHOID SURGERY  2002   JOINT REPLACEMENT Left 2014   knee   JOINT REPLACEMENT Right 05/17/2018   knee   KNEE ARTHROPLASTY Right 05/17/2018   Procedure: COMPUTER ASSISTED TOTAL KNEE ARTHROPLASTY;  Surgeon: Dereck Leep, MD;  Location: [facility] ORS;  Service: Orthopedics;  Laterality: Right;   KNEE ARTHROSCOPY  01/05/2019   Procedure: ARTHROSCOPY KNEE, LYSIS OF ADHESIONS;  Surgeon: Dereck Leep, MD;  Location: [facility] ORS;  Service: Orthopedics;;   NM MYOCAR PERF WALL MOTION  06/2012   lexiscan myoview - normal low risk study, EF 47%   PAROTIDECTOMY Left 08/04/2017   Procedure: SUPERFICIAL PAROTIDECTOMY;  Surgeon: Beverly Gust, MD;  Location: [facility] ORS;  Service: ENT;  Laterality: Left;   SOFT TISSUE TUMOR RESECTION  1972   of right jaw x3, pathology unknown, possible sarcoma   TONSILLECTOMY AND ADENOIDECTOMY     TOTAL KNEE ARTHROPLASTY Left    Dr. Marry Guan [facility] [place])   TRANSTHORACIC ECHOCARDIOGRAM  06/2012   EF 50-55%, mild MR; LA mild-mod dilated; RVSP increased   UPPER GASTROINTESTINAL ENDOSCOPY  05/03/2020    Allergies: Patient has no known allergies.  Medications: Prior to Admission medications   Medication Sig Start Date End Date Taking? Authorizing Provider  acetaminophen (TYLENOL) 325 MG tablet Take 650 mg by mouth every 6 (six) hours as needed.    [provider]  Ascorbic Acid (VITAMIN C) 1000 MG  tablet Take 1,000 mg by mouth daily.     [provider]  aspirin EC 81 MG tablet Take 81 mg by mouth daily with lunch.    [provider]  atorvastatin (LIPITOR) 80 MG tablet Take 80 mg by mouth at bedtime.    [provider]  carbidopa-levodopa (SINEMET IR) 25-100 MG tablet Take by mouth.  04/04/20 05/04/20  [provider]  carvedilol (COREG) 6.25 MG tablet Take 6.25 mg by mouth 2 (two) times daily with a meal.     [provider]  Coenzyme Q10 100 MG capsule Take 200 mg by mouth daily with lunch.    [provider]  CRANBERRY PO Take by mouth daily with lunch.     [provider]  diphenhydrAMINE (BENADRYL) 25 MG tablet Take 25 mg by mouth at bedtime as needed.    [provider]  docusate calcium (SURFAK) 240 MG capsule Take 480 mg by mouth at bedtime.    [provider]  Esomeprazole Magnesium (NEXIUM PO) Take by mouth daily as needed.    [provider]  furosemide (LASIX) 40 MG tablet Take 20 mg by mouth daily.     [provider]  gabapentin (NEURONTIN) 100 MG capsule Take 1-3 capsules (100-300 mg total) by mouth at bedtime. Follow written titration schedule. 08/16/20 09/15/20  Gillis Santa, MD  GLUCOSAMINE SULFATE PO Take by mouth daily.    [provider]  levOCARNitine L-Tartrate (L-CARNITINE) 500 MG CAPS Take 500 mg by mouth daily with lunch.     [provider]  magnesium oxide (MAG-OX) 400 MG tablet Take 400 mg by mouth daily.    [provider]  meloxicam (MOBIC) 15 MG tablet Take 15 mg by mouth as needed. 10/04/15   [provider]  Multiple Vitamins-Minerals (CENTRUM SILVER PO) Take 1 tablet by mouth daily.    [provider]  Multiple Vitamins-Minerals (ICAPS AREDS 2 PO) Take 1 capsule by mouth daily.     [provider]  Omega 3-6-9 Fatty Acids (OMEGA-3-6-9 PO) Take 1 capsule by mouth daily.    [provider]   pantoprazole (PROTONIX) 40 MG tablet Take 1 tablet (40 mg total) by mouth 2 (two) times daily. 02/27/21   Armbruster, Carlota Raspberry, MD  sacubitril-valsartan (ENTRESTO) 24-26 MG Take 1 tablet by mouth 2 (two) times daily.    [provider]  Selenium 200 MCG CAPS Take 200 mcg by mouth daily.     [provider]  spironolactone (ALDACTONE) 25 MG tablet Take 12.5 mg by mouth daily.    [provider]  tamsulosin (FLOMAX) 0.4 MG CAPS capsule Take 0.4 mg by mouth at bedtime.     [provider]  tetrahydrozoline 0.05 % ophthalmic solution Place 1  drop into both eyes daily as needed (for dry eyes).    [provider]  timolol (TIMOPTIC) 0.5 % ophthalmic solution 1 drop daily. 04/19/20   [provider]  traMADol (ULTRAM) 50 MG tablet Take by mouth every 6 (six) hours as needed.    [provider]  vitamin B-12 (CYANOCOBALAMIN) 1000 MCG tablet Take 1,000 mcg by mouth daily.    [provider]  zinc gluconate 50 MG tablet Take 50 mg by mouth daily.    [provider]     Family History  Problem Relation Age of Onset   Heart disease Mother    Parkinsonism Father    Alzheimer's disease Father    Diabetes Paternal Grandfather    Stomach cancer Cousin    Colon cancer Neg Hx    Esophageal cancer Neg Hx    Rectal cancer Neg Hx    Liver cancer Neg Hx    Colon polyps Neg Hx     Social History   Socioeconomic History   Marital status: Significant Other    Spouse name: Blenda Mounts   Number of children: 3   Years of education: Not on file   Highest education level: Not on file  Occupational History   Occupation: attorney    Comment: still work  Tobacco Use   Smoking status: Never   Smokeless tobacco: Never  Vaping Use   Vaping Use: Never used  Substance and Sexual Activity   Alcohol use: Yes    Comment: occasional beer every 3-4 week   Drug use: No   Sexual activity: Not Currently  Other Topics Concern   Not  on file  Social History Narrative   Not on file   Social Determinants of Health   Financial Resource Strain: Not on file  Food Insecurity: Not on file  Transportation Needs: Not on file  Physical Activity: Not on file  Stress: Not on file  Social Connections: Not on file     Review of Systems: A 12 point ROS discussed and pertinent positives are indicated in the HPI above.  All other systems are negative.  Review of Systems  Vital Signs: BP 107/65    Pulse 64    Temp 98.4 F (36.9 C)    Resp 14    Ht 5' 6"  (1.676 m)    Wt 78.9 kg    SpO2 96%    BMI 28.08 kg/m   Physical Exam Vitals reviewed.  Constitutional:      Appearance: Normal appearance.  HENT:     Head: Normocephalic and atraumatic.  Eyes:     Extraocular Movements: Extraocular movements intact.  Cardiovascular:     Rate and Rhythm: Normal rate and regular rhythm.  Pulmonary:     Effort: Pulmonary effort is normal. No respiratory distress.     Breath sounds: Normal breath sounds.  Abdominal:     General: There is no distension.     Palpations: Abdomen is soft.     Tenderness: There is no abdominal tenderness.  Musculoskeletal:        General: Normal range of motion.     Cervical back: Normal range of motion.  Skin:    General: Skin is warm and dry.  Neurological:     General: No focal deficit present.     Mental Status: He is alert and oriented to person, place, and time.  Psychiatric:        Mood and Affect: Mood normal.  Behavior: Behavior normal.        Thought Content: Thought content normal.        Judgment: Judgment normal.    Imaging: CT CHEST ABDOMEN PELVIS W CONTRAST  Result Date: 06/20/2021 CLINICAL DATA:  81 year old male with history of leukocytosis. Lymphadenopathy. History of prostate cancer. EXAM: CT CHEST, ABDOMEN, AND PELVIS WITH CONTRAST TECHNIQUE: Multidetector CT imaging of the chest, abdomen and pelvis was performed following the standard protocol during bolus  administration of intravenous contrast. CONTRAST:  111m OMNIPAQUE IOHEXOL 300 MG/ML  SOLN COMPARISON:  CT the chest, abdomen and pelvis 10/27/2019. FINDINGS: CT CHEST FINDINGS Cardiovascular: Heart size is normal. There is no significant pericardial fluid, thickening or pericardial calcification. There is aortic atherosclerosis, as well as atherosclerosis of the great vessels of the mediastinum and the coronary arteries, including calcified atherosclerotic plaque in the left main, left anterior descending, left circumflex and right coronary arteries. Status post median sternotomy for CABG including LIMA to the LAD. Thickening calcification of the aortic valve. Mediastinum/Nodes: Bulky lymphadenopathy in the superior mediastinum, largest of which is adjacent to the proximal left common carotid and left subclavian arteries measuring 2.2 cm in short axis (axial image 6 of series 2). No additional mediastinal or hilar lymphadenopathy noted. Esophagus is unremarkable in appearance. Deep axillary and subpectoral lymphadenopathy is noted bilaterally, measuring up to 1.5 cm in short axis on the left (axial image 7 of series 2) and 1.4 cm in short axis on the right (axial image 11 of series 2). Lungs/Pleura: 3 mm right middle lobe pulmonary nodule (axial image 91 of series 4), stable compared to the prior study, considered benign. No other larger more suspicious appearing pulmonary nodules or masses are noted. Scattered areas of mild linear scarring in the lung bases bilaterally. No acute consolidative airspace disease. No pleural effusions. Musculoskeletal: There are no aggressive appearing lytic or blastic lesions noted in the visualized portions of the skeleton. Median sternotomy wires. CT ABDOMEN PELVIS FINDINGS Hepatobiliary: 2.5 x 1.9 cm low-attenuation lesion in segment 6 of the liver (axial image 61 of series 2), unchanged. Other subcentimeter low-attenuation lesions in the left lobe of the liver also unchanged but  too small to characterize, statistically likely to represent tiny cysts. No new suspicious appearing hepatic lesions are noted. No intra or extrahepatic biliary ductal dilatation. Gallbladder is normal in appearance. Pancreas: No pancreatic mass. No pancreatic ductal dilatation. No pancreatic or peripancreatic fluid collections or inflammatory changes. Spleen: Unremarkable. Adrenals/Urinary Tract: Low-attenuation lesions in both kidneys, compatible with simple cysts, largest of which is exophytic extending from the posterior aspect of the upper pole of the left kidney measuring 7.8 x 6.3 cm. No suspicious renal lesions. Bilateral adrenal glands are normal in appearance. No hydroureteronephrosis. Urinary bladder is nearly decompressed, but otherwise unremarkable in appearance. Stomach/Bowel: The appearance of the stomach is normal. No pathologic dilatation of small bowel or colon. Numerous colonic diverticulae are noted, particularly in the sigmoid colon, without surrounding inflammatory changes to suggest an acute diverticulitis at this time. Normal appendix. Vascular/Lymphatic: Aortic atherosclerosis, without evidence of aneurysm or dissection in the abdominal or pelvic vasculature. Extensive retroperitoneal lymphadenopathy is again noted, progressed compared to the prior examination, with the bulkiest conglomerate nodal mass in the left para-aortic nodal station (axial image 72 of series 2), currently measuring 5.5 x 3.4 cm. Numerous enlarged mesenteric lymph nodes are also noted, largest of which is in the left side of the abdomen (axial image 73 of series 2) measuring 2.1 cm in short  axis. No definite pelvic lymphadenopathy. Reproductive: Fiducial markers are noted in and adjacent to the prostate gland which appears mildly enlarged, with some median lobe hypertrophy. Seminal vesicles are unremarkable in appearance. Other: No significant volume of ascites.  No pneumoperitoneum. Musculoskeletal: There are no  aggressive appearing lytic or blastic lesions noted in the visualized portions of the skeleton. IMPRESSION: 1. Extensive progressive lymphadenopathy in the retroperitoneum and small bowel mesentery, now also noted in the superior mediastinum and bilateral axillary/subpectoral regions. Although findings could be indicative of metastatic disease from primary such as prostate cancer, given the lack of pelvic lymphadenopathy and the unusual distribution of lymphadenopathy in this case, the possibility of a lymphoproliferative disorder such as leukemia or lymphoma should also be considered. 2. Colonic diverticulosis without evidence of acute diverticulitis at this time. 3. There are calcifications of the aortic valve. Echocardiographic correlation for evaluation of potential valvular dysfunction may be warranted if clinically indicated. 4. Aortic atherosclerosis, in addition to left main and three-vessel coronary artery disease. Status post median sternotomy for CABG including LIMA to the LAD. Electronically Signed   By: Vinnie Langton M.D.   On: 06/20/2021 10:50   NM PET Image Initial (PI) Skull Base To Thigh  Result Date: 07/01/2021 CLINICAL DATA:  Initial treatment strategy for lymphocytosis and retroperitoneal adenopathy on CT. History of remote jaw cancer and prostate cancer. EXAM: NUCLEAR MEDICINE PET SKULL BASE TO THIGH TECHNIQUE: 9.5 mCi F-18 FDG was injected intravenously. Full-ring PET imaging was performed from the skull base to thigh after the radiotracer. CT data was obtained and used for attenuation correction and anatomic localization. Fasting blood glucose: 90 mg/dl COMPARISON:  CTs of the chest, abdomen and pelvis 06/20/2021 and 10/27/2019. FINDINGS: Mediastinal blood pool activity: SUV max 2.2 Liver activity: SUV max 3.6 NECK: No hypermetabolic cervical lymph nodes are identified.There are no lesions of the pharyngeal mucosal space. No hypermetabolic thyroid activity. Incidental CT findings:  Grossly stable small thyroid nodules bilaterally from previous CT. Patient underwent thyroid ultrasound 01/10/2021 and thyroid biopsy 01/29/2021. CHEST: There are multiple mildly enlarged and mildly hypermetabolic superior mediastinal, axillary and subpectoral lymph nodes bilaterally. For example, there is a left paratracheal node measuring 2.3 cm short axis on image 73/3 which has an SUV max of 4.5. 1.6 cm left subpectoral node on image 74/3 has an SUV max of 5.3. 1.4 cm right subpectoral node on image 81/3 has an SUV max of 4.4. no hypermetabolic pulmonary activity or suspicious nodularity. Incidental CT findings: Diffuse atherosclerosis of the aorta, great vessels and coronary arteries again noted status post median sternotomy. ABDOMEN/PELVIS: There is no hypermetabolic activity within the liver, adrenal glands, spleen or pancreas. The recently demonstrated enlarged retroperitoneal and mesenteric lymph nodes in the upper abdomen are moderately hypermetabolic. The greatest metabolic activity is seen within a left mesenteric node measuring 2.0 cm on image 164/3 (SUV max 5.4). Nodal enlargement and distribution are similar to recent diagnostic CTs. No hypermetabolic pelvic adenopathy. Incidental CT findings: Hepatic and renal cysts are noted. There is aortic and branch vessel atherosclerosis and diffuse colonic diverticulosis. The prostate gland is enlarged without focal hypermetabolic activity. SKELETON: There is no hypermetabolic activity to suggest osseous metastatic disease. There is low-level hypermetabolic activity associated with the right 6th rib anteriorly (SUV max 3.4) with a probable underlying healing fracture (image 112/3). Probable adjacent healing fracture of the right 4th rib. Incidental CT findings: Previous median sternotomy. IMPRESSION: 1. The previously demonstrated thoracic and abdominal lymphadenopathy is moderately hypermetabolic. Recent serum PSA levels  are normal, and the lack of pelvic  adenopathy and suspicious osseous lesions make metastatic prostate cancer unlikely. Again, findings suggest a lymphoproliferative disorder. Tissue sampling recommended. 2. No suspicious hypermetabolic activity within the bones, lungs or parenchymal organs. 3. Probable healing right-sided rib fractures. 4. Coronary and Aortic Atherosclerosis (ICD10-I70.0). Electronically Signed   By: Richardean Sale M.D.   On: 07/01/2021 15:41    Labs:  CBC: Recent Labs    06/07/21 1412 07/05/21 0828  WBC 18.2* 24.5*  HGB 12.7* 12.5*  HCT 39.2 38.7*  PLT 230 243    COAGS: No results for input(s): INR, APTT in the last 8760 hours.  BMP: Recent Labs    06/07/21 1412  NA 134*  K 5.0  CL 102  CO2 24  GLUCOSE 98  BUN 27*  CALCIUM 9.5  CREATININE 1.01  GFRNONAA >60    LIVER FUNCTION TESTS: Recent Labs    06/07/21 1412  BILITOT 0.7  AST 26  ALT 7  ALKPHOS 59  PROT 6.6  ALBUMIN 4.0    TUMOR MARKERS: No results for input(s): AFPTM, CEA, CA199, CHROMGRNA in the last 8760 hours.  Assessment and Plan:  Lymphocytosis  Will proceed with image guided bone marrow biopsy today by Dr. Denna Haggard.  Risks and benefits of bone biopsy was discussed with the patient and/or patient's family including, but not limited to bleeding, infection, damage to adjacent structures or low yield requiring additional tests.  All of the questions were answered and there is agreement to proceed.  Consent signed and in chart.  Thank you for allowing our service to participate in NOLON YELLIN 's care.  Electronically Signed: Murrell Redden, PA-C   07/05/2021, 8:49 AM      I spent a total of  15 Minutes in face to face in clinical consultation, greater than 50% of which was counseling/coordinating care for bone marrow biopsy.

## 2021-07-05 NOTE — Pre-Procedure Instructions (Signed)
Cardiac clearance on chart from Dr Guillermina [place] Risk

## 2021-07-05 NOTE — Patient Instructions (Addendum)
Your procedure is scheduled on: 07/08/21 Report to [facility]. To find out your arrival time please call [phone] between 1PM - 3PM on 07/05/21.  Remember: Instructions that are not followed completely may result in serious medical risk, up to and including death, or upon the discretion of your surgeon and anesthesiologist your surgery may need to be rescheduled.     _X__ 1. Do not eat food after midnight the night before your procedure.                 No gum chewing or hard candies.   __X__2.  On the morning of surgery brush your teeth with toothpaste and water, you                 may rinse your mouth with mouthwash if you wish.  Do not swallow any              toothpaste of mouthwash.     _X__ 3.  No Alcohol for 24 hours before or after surgery.   _X__ 4.  Do Not Smoke or use e-cigarettes For 24 Hours Prior to Your Surgery.                 Do not use any chewable tobacco products for at least 6 hours prior to                 surgery.  ____  5.  Bring all medications with you on the day of surgery if instructed.   __X__  6.  Notify your doctor if there is any change in your medical condition      (cold, fever, infections).     Do not wear jewelry, make-up, hairpins, clips or nail polish. Do not wear lotions, powders, or perfumes.  Do not shave body hair 48 hours prior to surgery. Men may shave face and neck. Do not bring valuables to the hospital.    [facility] is not responsible for any belongings or valuables.  Contacts, dentures/partials or body piercings may not be worn into surgery. Bring a case for your contacts, glasses or hearing aids, a denture cup will be supplied. Leave your suitcase in the car. After surgery it may be brought to your room. For patients admitted to the hospital, discharge time is determined by your treatment team.   Patients discharged the day of surgery will not be allowed to drive  home.   Please read over the following fact sheets that you were given:     __X__ Take these medicines the morning of surgery with A SIP OF WATER:    1. carvedilol (COREG) 6.25 MG tablet  2.   3.   4.   5.  6.  ____ Fleet Enema (as directed)   __X__ Use CHG Soap/SAGE wipes as directed  ____ Use inhalers on the day of surgery  ____ Stop metformin/Janumet/Farxiga 2 days prior to surgery    ____ Take 1/2 of usual insulin dose the night before surgery. No insulin the morning          of surgery.   ____ Stop Blood Thinners Coumadin/Plavix/Xarelto/Pleta/Pradaxa/Eliquis/Effient/Aspirin  on   Or contact your Surgeon, Cardiologist or Medical Doctor regarding  ability to stop your blood thinners  __X__ Stop Anti-inflammatories 7 days before surgery such as Advil, Ibuprofen, Motrin,  BC or Goodies Powder, Naprosyn, Naproxen, Aleve   __X__ Stop all herbals and supplements, fish oil or vitamins  until after surgery.    ____ Bring C-Pap to the hospital.    Hold aspirin x 5 days

## 2021-07-08 ENCOUNTER — Ambulatory Visit: Payer: Medicare Other | Admitting: Registered Nurse

## 2021-07-08 ENCOUNTER — Other Ambulatory Visit: Payer: Self-pay

## 2021-07-08 ENCOUNTER — Ambulatory Visit
Admission: RE | Admit: 2021-07-08 | Discharge: 2021-07-08 | Disposition: A | Discharge status: Home / Self Care | Payer: Medicare Other | Attending: General Surgery | Admitting: General Surgery

## 2021-07-08 ENCOUNTER — Encounter
Admission: RE | Disposition: A | Discharge status: Home / Self Care | Payer: Self-pay | Source: Home / Self Care | Attending: General Surgery

## 2021-07-08 ENCOUNTER — Ambulatory Visit: Payer: Medicare Other

## 2021-07-08 ENCOUNTER — Encounter: Payer: Self-pay | Admitting: General Surgery

## 2021-07-08 DIAGNOSIS — Z79899 Other long term (current) drug therapy: Secondary | ICD-10-CM | POA: Insufficient documentation

## 2021-07-08 DIAGNOSIS — I2510 Atherosclerotic heart disease of native coronary artery without angina pectoris: Secondary | ICD-10-CM | POA: Insufficient documentation

## 2021-07-08 DIAGNOSIS — G4730 Sleep apnea, unspecified: Secondary | ICD-10-CM | POA: Diagnosis not present

## 2021-07-08 DIAGNOSIS — G20 Parkinson's disease: Secondary | ICD-10-CM | POA: Insufficient documentation

## 2021-07-08 DIAGNOSIS — Z87891 Personal history of nicotine dependence: Secondary | ICD-10-CM | POA: Diagnosis not present

## 2021-07-08 DIAGNOSIS — I712 Thoracic aortic aneurysm, without rupture, unspecified: Secondary | ICD-10-CM | POA: Insufficient documentation

## 2021-07-08 DIAGNOSIS — E782 Mixed hyperlipidemia: Secondary | ICD-10-CM | POA: Insufficient documentation

## 2021-07-08 DIAGNOSIS — Z8546 Personal history of malignant neoplasm of prostate: Secondary | ICD-10-CM | POA: Insufficient documentation

## 2021-07-08 DIAGNOSIS — K219 Gastro-esophageal reflux disease without esophagitis: Secondary | ICD-10-CM | POA: Diagnosis not present

## 2021-07-08 DIAGNOSIS — Z923 Personal history of irradiation: Secondary | ICD-10-CM | POA: Diagnosis not present

## 2021-07-08 DIAGNOSIS — I5022 Chronic systolic (congestive) heart failure: Secondary | ICD-10-CM | POA: Insufficient documentation

## 2021-07-08 DIAGNOSIS — Z7982 Long term (current) use of aspirin: Secondary | ICD-10-CM | POA: Insufficient documentation

## 2021-07-08 DIAGNOSIS — R590 Localized enlarged lymph nodes: Secondary | ICD-10-CM | POA: Diagnosis present

## 2021-07-08 DIAGNOSIS — C8511 Unspecified B-cell lymphoma, lymph nodes of head, face, and neck: Secondary | ICD-10-CM | POA: Insufficient documentation

## 2021-07-08 DIAGNOSIS — I110 Hypertensive heart disease with heart failure: Secondary | ICD-10-CM | POA: Insufficient documentation

## 2021-07-08 HISTORY — PX: MASS EXCISION: SHX2000

## 2021-07-08 SURGERY — EXCISION MASS
Anesthesia: General | Site: Chest | Laterality: Left | Wound class: Clean

## 2021-07-08 MED ORDER — CHLORHEXIDINE GLUCONATE 0.12 % MT SOLN
15.0000 mL | Freq: Once | OROMUCOSAL | Status: AC
  Administered 2021-07-08: 15 mL via OROMUCOSAL

## 2021-07-08 MED ORDER — ACETAMINOPHEN 10 MG/ML IV SOLN
INTRAVENOUS | Status: DC | PRN
  Administered 2021-07-08: 1000 mg via INTRAVENOUS

## 2021-07-08 MED ORDER — ACETAMINOPHEN 10 MG/ML IV SOLN: 1000.0000 mg | Freq: Once | INTRAVENOUS | Status: DC | PRN

## 2021-07-08 MED ORDER — FAMOTIDINE 20 MG PO TABS
ORAL_TABLET | ORAL | Status: AC
  Filled 2021-07-08: qty 1

## 2021-07-08 MED ORDER — LACTATED RINGERS IV SOLN: INTRAVENOUS | Status: DC

## 2021-07-08 MED ORDER — OXYCODONE HCL 5 MG PO TABS: 5.0000 mg | ORAL_TABLET | Freq: Once | ORAL | Status: DC | PRN

## 2021-07-08 MED ORDER — KETAMINE HCL 10 MG/ML IJ SOLN
INTRAMUSCULAR | Status: DC | PRN
  Administered 2021-07-08 (×2): 10 mg via INTRAVENOUS

## 2021-07-08 MED ORDER — PROPOFOL 10 MG/ML IV BOLUS
INTRAVENOUS | Status: DC | PRN
Start: 2021-07-08 — End: 2021-07-08
  Administered 2021-07-08: 20 mg via INTRAVENOUS
  Administered 2021-07-08: 10 mg via INTRAVENOUS
  Administered 2021-07-08: 20 mg via INTRAVENOUS

## 2021-07-08 MED ORDER — FAMOTIDINE 20 MG PO TABS
20.0000 mg | ORAL_TABLET | Freq: Once | ORAL | Status: AC
  Administered 2021-07-08: 20 mg via ORAL

## 2021-07-08 MED ORDER — ACETAMINOPHEN 10 MG/ML IV SOLN
INTRAVENOUS | Status: AC
  Filled 2021-07-08: qty 100

## 2021-07-08 MED ORDER — CEFAZOLIN SODIUM-DEXTROSE 2-4 GM/100ML-% IV SOLN
INTRAVENOUS | Status: AC
  Filled 2021-07-08: qty 100

## 2021-07-08 MED ORDER — ONDANSETRON HCL 4 MG/2ML IJ SOLN: 4.0000 mg | Freq: Once | INTRAMUSCULAR | Status: DC | PRN

## 2021-07-08 MED ORDER — FENTANYL CITRATE (PF) 100 MCG/2ML IJ SOLN: 25.0000 ug | INTRAMUSCULAR | Status: DC | PRN

## 2021-07-08 MED ORDER — PROPOFOL 500 MG/50ML IV EMUL
INTRAVENOUS | Status: AC
  Filled 2021-07-08: qty 50

## 2021-07-08 MED ORDER — FENTANYL CITRATE (PF) 100 MCG/2ML IJ SOLN
INTRAMUSCULAR | Status: AC
  Filled 2021-07-08: qty 2

## 2021-07-08 MED ORDER — EPHEDRINE SULFATE 50 MG/ML IJ SOLN
INTRAMUSCULAR | Status: DC | PRN
  Administered 2021-07-08: 5 mg via INTRAVENOUS

## 2021-07-08 MED ORDER — BUPIVACAINE-EPINEPHRINE 0.5% -1:200000 IJ SOLN
INTRAMUSCULAR | Status: DC | PRN
  Administered 2021-07-08: 15 mL

## 2021-07-08 MED ORDER — HYDROCODONE-ACETAMINOPHEN 5-325 MG PO TABS: 1.0000 | ORAL_TABLET | ORAL | 0 refills | Status: AC | PRN

## 2021-07-08 MED ORDER — CHLORHEXIDINE GLUCONATE 0.12 % MT SOLN
OROMUCOSAL | Status: AC
  Filled 2021-07-08: qty 15

## 2021-07-08 MED ORDER — PHENYLEPHRINE HCL (PRESSORS) 10 MG/ML IV SOLN
INTRAVENOUS | Status: DC | PRN
  Administered 2021-07-08 (×3): 160 ug via INTRAVENOUS

## 2021-07-08 MED ORDER — EPHEDRINE 5 MG/ML INJ
INTRAVENOUS | Status: AC
  Filled 2021-07-08: qty 5

## 2021-07-08 MED ORDER — CEFAZOLIN SODIUM-DEXTROSE 2-4 GM/100ML-% IV SOLN
2.0000 g | INTRAVENOUS | Status: AC
  Administered 2021-07-08: 2 g via INTRAVENOUS

## 2021-07-08 MED ORDER — FENTANYL CITRATE (PF) 100 MCG/2ML IJ SOLN
INTRAMUSCULAR | Status: DC | PRN
  Administered 2021-07-08 (×2): 25 ug via INTRAVENOUS

## 2021-07-08 MED ORDER — BUPIVACAINE-EPINEPHRINE (PF) 0.5% -1:200000 IJ SOLN
INTRAMUSCULAR | Status: AC
  Filled 2021-07-08: qty 30

## 2021-07-08 MED ORDER — ORAL CARE MOUTH RINSE: 15.0000 mL | Freq: Once | OROMUCOSAL | Status: AC

## 2021-07-08 MED ORDER — PROPOFOL 500 MG/50ML IV EMUL
INTRAVENOUS | Status: DC | PRN
  Administered 2021-07-08: 100 ug/kg/min via INTRAVENOUS

## 2021-07-08 MED ORDER — OXYCODONE HCL 5 MG/5ML PO SOLN: 5.0000 mg | Freq: Once | ORAL | Status: DC | PRN

## 2021-07-08 MED ORDER — KETAMINE HCL 50 MG/5ML IJ SOSY
PREFILLED_SYRINGE | INTRAMUSCULAR | Status: AC
  Filled 2021-07-08: qty 5

## 2021-07-08 SURGICAL SUPPLY — 37 items
APPLIER CLIP 9.375 SM OPEN (CLIP) ×2
BLADE SURG 15 STRL LF DISP TIS (BLADE) ×1 IMPLANT
BLADE SURG 15 STRL SS (BLADE) ×2
CHLORAPREP W/TINT 26 (MISCELLANEOUS) ×1 IMPLANT
CLIP APPLIE 9.375 SM OPEN (CLIP) IMPLANT
CNTNR SPEC 2.5X3XGRAD LEK (MISCELLANEOUS)
CONT SPEC 4OZ STER OR WHT (MISCELLANEOUS)
CONT SPEC 4OZ STRL OR WHT (MISCELLANEOUS)
CONTAINER SPEC 2.5X3XGRAD LEK (MISCELLANEOUS) ×1 IMPLANT
DERMABOND ADVANCED (GAUZE/BANDAGES/DRESSINGS) ×1
DERMABOND ADVANCED .7 DNX12 (GAUZE/BANDAGES/DRESSINGS) ×1 IMPLANT
DRAPE LAPAROTOMY 100X77 ABD (DRAPES) ×2 IMPLANT
DRAPE UNDER BUTTOCK W/FLU (DRAPES) IMPLANT
ELECT REM PT RETURN 9FT ADLT (ELECTROSURGICAL) ×2
ELECTRODE REM PT RTRN 9FT ADLT (ELECTROSURGICAL) ×1 IMPLANT
GAUZE 4X4 16PLY ~~LOC~~+RFID DBL (SPONGE) ×2 IMPLANT
GLOVE SURG ENC MOIS LTX SZ6.5 (GLOVE) ×4 IMPLANT
GLOVE SURG UNDER POLY LF SZ6.5 (GLOVE) ×4 IMPLANT
GOWN SRG LRG LVL 4 IMPRV REINF (GOWNS) IMPLANT
GOWN STRL REIN LRG LVL4 (GOWNS) ×6
HEMOSTAT ARISTA ABSORB 1G (HEMOSTASIS) ×1 IMPLANT
KIT TURNOVER KIT A (KITS) ×2 IMPLANT
LABEL OR SOLS (LABEL) ×2 IMPLANT
MANIFOLD NEPTUNE II (INSTRUMENTS) ×2 IMPLANT
MARGIN MAP 10MM (MISCELLANEOUS) ×1 IMPLANT
NDL HYPO 25X1 1.5 SAFETY (NEEDLE) ×1 IMPLANT
NEEDLE HYPO 25X1 1.5 SAFETY (NEEDLE) ×2 IMPLANT
NS IRRIG 500ML POUR BTL (IV SOLUTION) ×2 IMPLANT
PACK BASIN MINOR ARMC (MISCELLANEOUS) ×2 IMPLANT
SUT ETHILON 3-0 (SUTURE) ×2 IMPLANT
SUT MNCRL 4-0 (SUTURE) ×2
SUT MNCRL 4-0 27XMFL (SUTURE) ×1
SUT VIC AB 2-0 SH 27 (SUTURE) ×2
SUT VIC AB 2-0 SH 27XBRD (SUTURE) ×1 IMPLANT
SUTURE MNCRL 4-0 27XMF (SUTURE) ×1 IMPLANT
SYR 10ML LL (SYRINGE) ×2 IMPLANT
WATER STERILE IRR 500ML POUR (IV SOLUTION) ×1 IMPLANT

## 2021-07-08 NOTE — Op Note (Signed)
Preoperative diagnosis: Cervical lymphadenopathy  Postoperative diagnosis: Same.   Procedure: Excisional biopsy of left cervical/infra clavicular lymph node  Anesthesia: GETA  Surgeon: Dr. Windell Moment  Wound Classification: Clean  Indications: Patient is a 81 y.o. male with leukocytosis and currently on work-up for lymphoma with a palpable left infraclavicular that was hypermetabolic on PET scan concerning for lymphoma.   Findings: 1.  2 cm palpable lymph node in the left infraclavicular area 2.  No other palpable lymphadenopathy. 3.  Adequate hemostasis  Description of procedure: The patient was taken to the operating room and placed supine on the operating table, and after MAC the left chest was prepped and draped in the usual sterile fashion. A time-out was completed verifying correct patient, procedure, site, positioning, and implant(s) and/or special equipment prior to beginning this procedure.  With guidance of ultrasound intraoperatively I was able to identify the site location of the enlarged lymph node.  Local anesthesia was infiltrated on top of the visualized lymph node.  A linear incision was done.  Dissection was taken down deep to the pectoralis muscle.  Right at the midclavicular line deep to the clavicle a palpable 2 cm lymph node was identified.  The was dissected circumferentially and removed completely.  Hemostasis achieved with 3-0 Vicryl.  Arixtra left in place.  The deep wound was closed in layers with interrupted sutures of 3-0 Vicryl and a subcuticular suture of Monocryl 4-0.   Specimen: Left infraclavicular lymph nodes  Complications: None  Estimated Blood Loss: 5 mL

## 2021-07-08 NOTE — Anesthesia Postprocedure Evaluation (Signed)
Anesthesia Post Note  Patient: Wesley [name]  Procedure(s) Performed: EXCISION MASS (Left: Chest)  Patient location during evaluation: PACU Anesthesia Type: General Level of consciousness: awake and alert, oriented and patient cooperative Pain management: pain level controlled Vital Signs Assessment: post-procedure vital signs reviewed and stable Respiratory status: spontaneous breathing, nonlabored ventilation and respiratory function stable Cardiovascular status: blood pressure returned to baseline and stable Postop Assessment: adequate PO intake Anesthetic complications: no   No notable events documented.   Last Vitals:  Vitals:   07/08/21 1115 07/08/21 1141  BP: 114/61 (!) 125/58  Pulse: 63 65  Resp: 16 17  Temp: 36.6 C (!) 36.3 C  SpO2: 99% 97%    Last Pain:  Vitals:   07/08/21 1141  TempSrc: Temporal  PainSc: 0-No pain                 Darrin Nipper

## 2021-07-08 NOTE — Anesthesia Preprocedure Evaluation (Addendum)
Anesthesia Evaluation  Patient identified by MRN, date of birth, ID band Patient awake    Reviewed: Allergy & Precautions, NPO status , Patient's Chart, lab work & pertinent test results  History of Anesthesia Complications Negative for: history of anesthetic complications  Airway Mallampati: IV   Neck ROM: Full    Dental  (+)    Pulmonary sleep apnea ,    Pulmonary exam normal breath sounds clear to auscultation       Cardiovascular hypertension, + CAD (s/p CABG) and +CHF (2/2 ischemic cardiomyopathy, EF 35-40%)  Normal cardiovascular exam Rhythm:Regular Rate:Normal     Neuro/Psych Vertigo, chronic back pain  Neuromuscular disease (Parkinsonism) CVA (2011, residual balance deficit)    GI/Hepatic negative GI ROS,   Endo/Other  negative endocrine ROS  Renal/GU negative Renal ROS     Musculoskeletal  (+) Arthritis ,   Abdominal   Peds  Hematology Prostate, jaw, colon cancers   Anesthesia Other Findings Reviewed 04/11/21 cardiology note.  Reproductive/Obstetrics                            Anesthesia Physical Anesthesia Plan  ASA: 3  Anesthesia Plan: General   Post-op Pain Management:    Induction: Intravenous  PONV Risk Score and Plan: 2 and Ondansetron, Treatment may vary due to age or medical condition, Propofol infusion and TIVA  Airway Management Planned: Natural Airway  Additional Equipment:   Intra-op Plan:   Post-operative Plan:   Informed Consent: I have reviewed the patients History and Physical, chart, labs and discussed the procedure including the risks, benefits and alternatives for the proposed anesthesia with the patient or authorized representative who has indicated his/her understanding and acceptance.     Dental advisory given  Plan Discussed with: CRNA  Anesthesia Plan Comments: (Plan for GA with natural airway, LMA/GETA backup.  Patient consented for  risks of anesthesia including but not limited to:  - adverse reactions to medications - damage to eyes, teeth, lips or other oral mucosa - nerve damage due to positioning  - sore throat or hoarseness - damage to heart, brain, nerves, lungs, other parts of body or loss of life  Informed patient about role of CRNA in peri- and intra-operative care.  Patient voiced understanding.)       Anesthesia Quick Evaluation

## 2021-07-08 NOTE — Progress Notes (Signed)
Patient is to restart Asprin 81 mg on 07/09/2021 (Tuesday). Notified [name] and charted in AVS.

## 2021-07-08 NOTE — Discharge Instructions (Addendum)
°  Diet: Resume home heart healthy regular diet.   Activity: Increase activity as tolerated. Light activity and walking are encouraged. Do not drive or drink alcohol if taking narcotic pain medications.  Wound care: May shower with soapy water and pat dry (do not rub incisions), but no baths or submerging incision underwater until follow-up. (no swimming)   Medications: Resume all home medications. For mild to moderate pain: acetaminophen (Tylenol) or ibuprofen (if no kidney disease). Combining Tylenol with alcohol can substantially increase your risk of causing liver disease. Narcotic pain medications, if prescribed, can be used for severe pain, though may cause nausea, constipation, and drowsiness. Do not combine Tylenol and Norco within a 6 hour period as Norco contains Tylenol. If you do not need the narcotic pain medication, you do not need to fill the prescription.  Call office [phone]) at any time if any questions, worsening pain, fevers/chills, bleeding, drainage from incision site, or other concerns.   AMBULATORY SURGERY  DISCHARGE INSTRUCTIONS   The drugs that you were given will stay in your system until tomorrow so for the next 24 hours you should not:  Drive an automobile Make any legal decisions Drink any alcoholic beverage   You may resume regular meals tomorrow.  Today it is better to start with liquids and gradually work up to solid foods.  You may eat anything you prefer, but it is better to start with liquids, then soup and crackers, and gradually work up to solid foods.   Please notify your doctor immediately if you have any unusual bleeding, trouble breathing, redness and pain at the surgery site, drainage, fever, or pain not relieved by medication.   Please contact your physician with any problems or Same Day Surgery at [phone], Monday through Friday 6 am to 4 pm, or [place] at [facility] number at [phone].

## 2021-07-08 NOTE — Interval H&P Note (Signed)
History and Physical Interval Note:  07/08/2021 9:20 AM  Wesley [name]  has presented today for surgery, with the diagnosis of R59.1 Lymphadenopathy.  The various methods of treatment have been discussed with the patient and family. After consideration of risks, benefits and other options for treatment, the patient has consented to  Procedure(s): EXCISION MASS (Left) as a surgical intervention.  The patient's history has been reviewed, patient examined, no change in status, stable for surgery.  I have reviewed the patient's chart and labs.  Questions were answered to the patient's satisfaction.     Herbert Pun

## 2021-07-08 NOTE — Transfer of Care (Signed)
Immediate Anesthesia Transfer of Care Note  Patient: Wesley [name]  Procedure(s) Performed: EXCISION MASS (Left: Chest)  Patient Location: PACU  Anesthesia Type:General  Level of Consciousness: drowsy  Airway & Oxygen Therapy: Patient Spontanous Breathing and Patient connected to face mask oxygen  Post-op Assessment: Report given to RN and Post -op Vital signs reviewed and stable  Post vital signs: Reviewed and stable  Last Vitals:  Vitals Value Taken Time  BP 106/72 07/08/21 1100  Temp    Pulse 68 07/08/21 1100  Resp 19 07/08/21 1100  SpO2 100 % 07/08/21 1100    Last Pain:  Vitals:   07/08/21 0906  TempSrc: Oral  PainSc: 0-No pain         Complications: No notable events documented.

## 2021-07-09 ENCOUNTER — Encounter: Payer: Self-pay | Admitting: General Surgery

## 2021-07-09 LAB — SURGICAL PATHOLOGY

## 2021-07-10 ENCOUNTER — Encounter: Payer: Self-pay | Admitting: General Surgery

## 2021-07-10 ENCOUNTER — Inpatient Hospital Stay: Admission: RE | Admit: 2021-07-10 | Payer: Medicare Other | Source: Ambulatory Visit

## 2021-07-10 LAB — SURGICAL PATHOLOGY

## 2021-07-15 ENCOUNTER — Inpatient Hospital Stay: Admission: RE | Admit: 2021-07-15 | Payer: Medicare Other | Source: Ambulatory Visit

## 2021-07-16 ENCOUNTER — Encounter (HOSPITAL_COMMUNITY): Payer: Self-pay | Admitting: Oncology

## 2021-07-19 ENCOUNTER — Other Ambulatory Visit: Payer: Self-pay

## 2021-07-19 ENCOUNTER — Inpatient Hospital Stay (HOSPITAL_BASED_OUTPATIENT_CLINIC_OR_DEPARTMENT_OTHER): Payer: Medicare Other | Admitting: Oncology

## 2021-07-19 ENCOUNTER — Encounter: Payer: Self-pay | Admitting: Oncology

## 2021-07-19 VITALS — BP 90/60 | HR 72 | Temp 97.5°F | Resp 16 | Wt 176.0 lb

## 2021-07-19 DIAGNOSIS — C8510 Unspecified B-cell lymphoma, unspecified site: Secondary | ICD-10-CM | POA: Diagnosis not present

## 2021-07-19 DIAGNOSIS — Z8546 Personal history of malignant neoplasm of prostate: Secondary | ICD-10-CM

## 2021-07-19 DIAGNOSIS — R591 Generalized enlarged lymph nodes: Secondary | ICD-10-CM | POA: Diagnosis not present

## 2021-07-19 DIAGNOSIS — Z7189 Other specified counseling: Secondary | ICD-10-CM | POA: Diagnosis not present

## 2021-07-19 NOTE — Progress Notes (Signed)
Pt in for follow up and biopsy results.  

## 2021-07-19 NOTE — Progress Notes (Signed)
Hematology/Oncology Progress Note [phone] [phone]   Patient Care Team: [name], MD as PCP - General  REFERRING PROVIDER: [name], MD  CHIEF COMPLAINTS/REASON FOR VISIT:  Follow-up for  HISTORY OF PRESENTING ILLNESS:  Wesley [name] is a  81 y.o.  male with PMH listed below who was referred to me for evaluation of leukocytosis Reviewed patient' recent labs obtained by PCP.   05/21/2021 CBC showed elevated white count of 20.1, predominantly lymphocytosis and neutrophilia Previous lab records reviewed. Leukocytosis onset of chronic, duration is since 2019. 05/21/2021 peripheral blood flowcytometry showed 1) CD5 mostly negative to dim+, CD10-, CD23-, FMC-7+ B cell  lymphoproliferative disorder detected, nonspecific phenotype, 2) CD5+, CD23+ clonal B cell population detected, CLL/SLL phenotype, CD38 indeterminate for prognosis (29%), 18% of leukocytes, <5,000 cells/uL.   Denies fever, chills,night sweats. + Fatigue + weight loss 9 pounds since Feb 2022. Significant other cooks healthy food.   Denies History of recent oral steroid use or steroid injection. Denies recent infection:  Denies Autoimmune disease history.    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  History of prostate cancer in 2017, he received radiation.   10/27/2019 CT angio chest abdomen pelvis showed Retroperitoneal adenopathy   INTERVAL HISTORY Wesley [name] is a 81 y.o. male who has above history reviewed by me today presents for follow up visit for lymphadenopathy.  06/07/2021, peripheral blood flow cytometry showed 1)  CD5+ B-cell lymphoproliferative disorder  -not typical for CLL/SLL or mantle cell lymphoma. 2) CD5+, CD23+ clonal B-cell population, CLL/SLL phenotype, CD38 +, 21% of leukocytes <5000/uL  07/05/2021, bone marrow biopsy showed hypercellular bone marrow with involvement of a low-grade B-cell lymphoproliferative  disorder.  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   Flow cytometry analysis showed major B-cell population representing 56% of all cells expressing and B-cell antigens including CD20.  Half of the B-cell population showed CD5 and CD200 expression associated with extremely thin/negative staining for surface immunoglobulin light chains.  The other half lacks CD5 expression but displays CD200 expression and lambda light chain restrictions.  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  Ki-67 stain is variable in the interfollicular areas, 78% to 30%.  Flow cytometry was performed on nodal tissue and reported separately by [employer], accession [id number]. In summary, there are two separate B cell clones, in roughly equal proportions:  1) Kappa light chain restricted (dim expression), CD20+ (dim), CD5+, CD23+, CD43+, FMC-  2) Lambda light chain restricted, CD20+, CD5-, CD23+, CD43-, FMC+(dim). The first clone is present in the blood, and is new for this patient in 2022. The second clone was first detected in the blood in 2017, when the patient had fewer than 5,000 lymphocytes per microL, and is persistent in the blood. The bone marrow is also positive for both clones in equal proportions.   Differential is between biclonal CLL/SLL, biclonal marginal zone lymphoma, Lymphoma, or composite lymphoma with both diagnosis.  07/01/2021, PET scan showed hypermetabolic superior mediastinal, axillary and subpectoral lymph nodes bilaterally, retroperitoneal and mesenteric lymph nodes.  No suspicious hypermetabolic activity within the bone, lungs: Parenchymal organs.  Probable healing right-sided rib fractures.  Coronary and aortic atherosclerosis.  Patient reports having good appetite, denies unintentional  weight loss, night sweats, fever.    Review of Systems  Constitutional:  Positive for fatigue. Negative for appetite change,  chills, fever and unexpected weight change.  HENT:   Negative for hearing loss and voice change.   Eyes:  Negative for eye problems and icterus.  Respiratory:  Negative for chest tightness, cough and shortness of breath.   Cardiovascular:  Negative for chest pain and leg swelling.  Gastrointestinal:  Negative for abdominal distention and abdominal pain.  Endocrine: Negative for hot flashes.  Genitourinary:  Negative for difficulty urinating, dysuria and frequency.   Musculoskeletal:  Negative for arthralgias.  Skin:  Negative for itching and rash.  Neurological:  Negative for light-headedness and numbness.  Hematological:  Negative for adenopathy. Does not bruise/bleed easily.  Psychiatric/Behavioral:  Negative for confusion.     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 sleep study 06/03/2012 - AHI 54.74/hr during total sleep, AHI 49.66/hr during REM   Prostate cancer ([place]) 10/2015   radiation  followed by Dr. Karsten Ro   S/P CABG x 4 2005   Stroke [facility]) 2011   damage to back of brain, difficulty when turning with ambulation   Synovitis and tenosynovitis, unspecified    Thoracic aortic aneurysm without rupture  10/2019   4.4 cm by CA 2021   Tubular adenoma of colon    Unspecified vitamin D deficiency     SURGICAL HISTORY: Past Surgical History:  Procedure Laterality Date   BIOPSY THYROID     HX NODULES   CARDIAC CATHETERIZATION  02/01/2004   L main with 60% prox stenosis, LAD calcified in prox section with 95% ostial lesion, diffusion 50-60% disease in mid LAD with focal 90% mid LAD stenosis; 1st diagonal coarsely irregular with up to 30% stenosis in prox & mid segments; L Cfx with 3 OMs, AV groove Cfx with 40-50% stenosis; 3rd OM with 50% prox stenosis; RCA with PDA & PLA, PLA with 95% osital lesion (Dr. Gerrie Nordmann)   CARPAL TUNNEL RELEASE     CATARACT EXTRACTION, BILATERAL     COLONOSCOPY     CORONARY ARTERY BYPASS GRAFT  02/02/2004   LIMA to DX1, RIMA to RCA, SVG to distal LAD, SVG to Cfx marginal (Dr. Remus Loffler)   EYE SURGERY Bilateral    cataract extractions   gold seed marker placement/prostate  02/28/2016   HEMORRHOID SURGERY  2002   JOINT REPLACEMENT Left 2014   knee   JOINT REPLACEMENT Right 05/17/2018   knee   KNEE ARTHROPLASTY Right 05/17/2018   Procedure: COMPUTER ASSISTED TOTAL KNEE ARTHROPLASTY;  Surgeon: Dereck Leep, MD;  Location: [facility] ORS;  Service: Orthopedics;  Laterality: Right;   KNEE ARTHROSCOPY  01/05/2019   Procedure: ARTHROSCOPY KNEE, LYSIS OF ADHESIONS;  Surgeon: Dereck Leep, MD;  Location: [facility] ORS;  Service: Orthopedics;;   MASS EXCISION  Left 07/08/2021   Procedure: EXCISION MASS;  Surgeon: Herbert Pun, MD;  Location: [facility] ORS;  Service: General;  Laterality: Left;   NM MYOCAR PERF WALL MOTION  06/2012   lexiscan myoview - normal low risk study, EF 47%   PAROTIDECTOMY Left 08/04/2017   Procedure: SUPERFICIAL PAROTIDECTOMY;  Surgeon: Beverly Gust, MD;  Location: [facility] ORS;  Service: ENT;  Laterality: Left;   SOFT TISSUE TUMOR RESECTION  1972   of right jaw x3, pathology unknown, possible sarcoma   TONSILLECTOMY AND ADENOIDECTOMY     TOTAL  KNEE ARTHROPLASTY Left    Dr. Marry Guan [facility], [place])   TRANSTHORACIC ECHOCARDIOGRAM  06/2012   EF 50-55%, mild MR; LA mild-mod dilated; RVSP increased   UPPER GASTROINTESTINAL ENDOSCOPY  05/03/2020    SOCIAL HISTORY: Social History   Socioeconomic History   Marital status: Significant Other    Spouse name: Blenda Mounts   Number of children: 3   Years of education: Not on file   Highest education level: Not on file  Occupational History   Occupation: attorney    Comment: still work  Tobacco Use   Smoking status: Never   Smokeless tobacco: Never  Vaping Use   Vaping Use: Never used  Substance and Sexual Activity   Alcohol use: Not Currently    Comment: occasional beer every 3-4 week   Drug use: No   Sexual activity: Not Currently  Other Topics Concern   Not on file  Social History Narrative   Lives alone.   Social Determinants of Health   Financial Resource Strain: Not on file  Food Insecurity: Not on file  Transportation Needs: Not on file  Physical Activity: Not on file  Stress: Not on file  Social Connections: Not on file  Intimate Partner Violence: Not on file    FAMILY HISTORY: Family History  Problem Relation Age of Onset   Heart disease Mother    Parkinsonism Father    Alzheimer's disease Father    Diabetes Paternal Grandfather    Stomach cancer Cousin    Colon cancer Neg Hx    Esophageal cancer Neg Hx    Rectal cancer Neg Hx    Liver cancer Neg Hx    Colon polyps Neg Hx     ALLERGIES:  has No Known Allergies.  MEDICATIONS:  Current Outpatient Medications  Medication Sig Dispense Refill   acetaminophen (TYLENOL) 325 MG tablet Take 650 mg by mouth every 6 (six) hours as needed.     Ascorbic Acid (VITAMIN C) 1000 MG tablet Take 1,000 mg by mouth daily.      aspirin EC 81 MG tablet Take 81 mg by mouth daily with lunch.     atorvastatin (LIPITOR) 80 MG tablet Take 80 mg by mouth at bedtime.     B Complex Vitamins (B COMPLEX 1 PO) Take 1 tablet  by mouth daily.     carvedilol (COREG) 6.25 MG tablet Take 6.25 mg by mouth 2 (two) times daily with a meal.      docusate calcium (SURFAK) 240 MG capsule Take 480 mg by mouth at bedtime as needed.     furosemide (LASIX) 40 MG tablet Take 20 mg by mouth daily as needed.     GLUCOSAMINE SULFATE PO Take by mouth daily.     Lactobacillus (PROBIOTIC ACIDOPHILUS PO) Take 1 tablet by mouth daily.     Lysine 1000 MG TABS Take 1 tablet by mouth daily.     magnesium oxide (MAG-OX) 400 MG  tablet Take 400 mg by mouth daily.     Multiple Vitamins-Minerals (CENTRUM SILVER PO) Take 1 tablet by mouth daily.     Multiple Vitamins-Minerals (ICAPS AREDS 2 PO) Take 1 capsule by mouth daily.      sacubitril-valsartan (ENTRESTO) 24-26 MG Take 1 tablet by mouth 2 (two) times daily.     spironolactone (ALDACTONE) 25 MG tablet Take 12.5 mg by mouth daily.     tetrahydrozoline 0.05 % ophthalmic solution Place 1 drop into both eyes daily as needed (for dry eyes).     timolol (TIMOPTIC) 0.5 % ophthalmic solution Place 1 drop into both eyes at bedtime.     traMADol (ULTRAM) 50 MG tablet Take 50 mg by mouth every 6 (six) hours as needed.     vitamin B-12 (CYANOCOBALAMIN) 1000 MCG tablet Take 1,000 mcg by mouth daily.     zinc gluconate 50 MG tablet Take 50 mg by mouth daily.     carbidopa-levodopa (SINEMET IR) 25-100 MG tablet Take by mouth. ON HOLD (Patient not taking: Reported on 07/19/2021)     Coenzyme Q10 100 MG capsule Take 200 mg by mouth daily with lunch. (Patient not taking: Reported on 07/05/2021)     tamsulosin (FLOMAX) 0.4 MG CAPS capsule Take 0.4 mg by mouth at bedtime.  (Patient not taking: Reported on 07/05/2021)     No current facility-administered medications for this visit.     PHYSICAL EXAMINATION: ECOG PERFORMANCE STATUS: 1 - Symptomatic but completely ambulatory Vitals:   07/19/21 1244  BP: 90/60  Pulse: 72  Resp: 16  Temp: (!) 97.5 F (36.4 C)   Filed Weights   07/19/21 1244  Weight: 176  lb (79.8 kg)    Physical Exam Constitutional:      General: He is not in acute distress. HENT:     Head: Normocephalic and atraumatic.  Eyes:     General: No scleral icterus. Cardiovascular:     Rate and Rhythm: Normal rate and regular rhythm.     Heart sounds: Normal heart sounds.  Pulmonary:     Effort: Pulmonary effort is normal. No respiratory distress.     Breath sounds: No wheezing.  Abdominal:     General: Bowel sounds are normal. There is no distension.     Palpations: Abdomen is soft.  Musculoskeletal:        General: No deformity. Normal range of motion.     Cervical back: Normal range of motion and neck supple.  Skin:    General: Skin is warm and dry.     Findings: No erythema or rash.  Neurological:     Mental Status: He is alert and oriented to person, place, and time. Mental status is at baseline.     Cranial Nerves: No cranial nerve deficit.     Coordination: Coordination normal.  Psychiatric:        Mood and Affect: Mood normal.  Small palpable axillary lymph nodes       RADIOGRAPHIC STUDIES: I have personally reviewed the radiological images as listed and agreed with the findings in the report. CT CHEST ABDOMEN PELVIS W CONTRAST  Result Date: 06/20/2021 CLINICAL DATA:  81 year old male with history of leukocytosis. Lymphadenopathy. History of prostate cancer. EXAM: CT CHEST, ABDOMEN, AND PELVIS WITH CONTRAST TECHNIQUE: Multidetector CT imaging of the chest, abdomen and pelvis was performed following the standard protocol during bolus administration of intravenous contrast. CONTRAST:  128m OMNIPAQUE IOHEXOL 300 MG/ML  SOLN COMPARISON:  CT the chest, abdomen and pelvis  10/27/2019. FINDINGS: CT CHEST FINDINGS Cardiovascular: Heart size is normal. There is no significant pericardial fluid, thickening or pericardial calcification. There is aortic atherosclerosis, as well as atherosclerosis of the great vessels of the mediastinum and the coronary arteries,  including calcified atherosclerotic plaque in the left main, left anterior descending, left circumflex and right coronary arteries. Status post median sternotomy for CABG including LIMA to the LAD. Thickening calcification of the aortic valve. Mediastinum/Nodes: Bulky lymphadenopathy in the superior mediastinum, largest of which is adjacent to the proximal left common carotid and left subclavian arteries measuring 2.2 cm in short axis (axial image 6 of series 2). No additional mediastinal or hilar lymphadenopathy noted. Esophagus is unremarkable in appearance. Deep axillary and subpectoral lymphadenopathy is noted bilaterally, measuring up to 1.5 cm in short axis on the left (axial image 7 of series 2) and 1.4 cm in short axis on the right (axial image 11 of series 2). Lungs/Pleura: 3 mm right middle lobe pulmonary nodule (axial image 91 of series 4), stable compared to the prior study, considered benign. No other larger more suspicious appearing pulmonary nodules or masses are noted. Scattered areas of mild linear scarring in the lung bases bilaterally. No acute consolidative airspace disease. No pleural effusions. Musculoskeletal: There are no aggressive appearing lytic or blastic lesions noted in the visualized portions of the skeleton. Median sternotomy wires. CT ABDOMEN PELVIS FINDINGS Hepatobiliary: 2.5 x 1.9 cm low-attenuation lesion in segment 6 of the liver (axial image 61 of series 2), unchanged. Other subcentimeter low-attenuation lesions in the left lobe of the liver also unchanged but too small to characterize, statistically likely to represent tiny cysts. No new suspicious appearing hepatic lesions are noted. No intra or extrahepatic biliary ductal dilatation. Gallbladder is normal in appearance. Pancreas: No pancreatic mass. No pancreatic ductal dilatation. No pancreatic or peripancreatic fluid collections or inflammatory changes. Spleen: Unremarkable. Adrenals/Urinary Tract: Low-attenuation lesions  in both kidneys, compatible with simple cysts, largest of which is exophytic extending from the posterior aspect of the upper pole of the left kidney measuring 7.8 x 6.3 cm. No suspicious renal lesions. Bilateral adrenal glands are normal in appearance. No hydroureteronephrosis. Urinary bladder is nearly decompressed, but otherwise unremarkable in appearance. Stomach/Bowel: The appearance of the stomach is normal. No pathologic dilatation of small bowel or colon. Numerous colonic diverticulae are noted, particularly in the sigmoid colon, without surrounding inflammatory changes to suggest an acute diverticulitis at this time. Normal appendix. Vascular/Lymphatic: Aortic atherosclerosis, without evidence of aneurysm or dissection in the abdominal or pelvic vasculature. Extensive retroperitoneal lymphadenopathy is again noted, progressed compared to the prior examination, with the bulkiest conglomerate nodal mass in the left para-aortic nodal station (axial image 72 of series 2), currently measuring 5.5 x 3.4 cm. Numerous enlarged mesenteric lymph nodes are also noted, largest of which is in the left side of the abdomen (axial image 73 of series 2) measuring 2.1 cm in short axis. No definite pelvic lymphadenopathy. Reproductive: Fiducial markers are noted in and adjacent to the prostate gland which appears mildly enlarged, with some median lobe hypertrophy. Seminal vesicles are unremarkable in appearance. Other: No significant volume of ascites.  No pneumoperitoneum. Musculoskeletal: There are no aggressive appearing lytic or blastic lesions noted in the visualized portions of the skeleton. IMPRESSION: 1. Extensive progressive lymphadenopathy in the retroperitoneum and small bowel mesentery, now also noted in the superior mediastinum and bilateral axillary/subpectoral regions. Although findings could be indicative of metastatic disease from primary such as prostate cancer, given the lack of  pelvic lymphadenopathy and  the unusual distribution of lymphadenopathy in this case, the possibility of a lymphoproliferative disorder such as leukemia or lymphoma should also be considered. 2. Colonic diverticulosis without evidence of acute diverticulitis at this time. 3. There are calcifications of the aortic valve. Echocardiographic correlation for evaluation of potential valvular dysfunction may be warranted if clinically indicated. 4. Aortic atherosclerosis, in addition to left main and three-vessel coronary artery disease. Status post median sternotomy for CABG including LIMA to the LAD. Electronically Signed   By: Vinnie Langton M.D.   On: 06/20/2021 10:50   NM PET Image Initial (PI) Skull Base To Thigh  Result Date: 07/01/2021 CLINICAL DATA:  Initial treatment strategy for lymphocytosis and retroperitoneal adenopathy on CT. History of remote jaw cancer and prostate cancer. EXAM: NUCLEAR MEDICINE PET SKULL BASE TO THIGH TECHNIQUE: 9.5 mCi F-18 FDG was injected intravenously. Full-ring PET imaging was performed from the skull base to thigh after the radiotracer. CT data was obtained and used for attenuation correction and anatomic localization. Fasting blood glucose: 90 mg/dl COMPARISON:  CTs of the chest, abdomen and pelvis 06/20/2021 and 10/27/2019. FINDINGS: Mediastinal blood pool activity: SUV max 2.2 Liver activity: SUV max 3.6 NECK: No hypermetabolic cervical lymph nodes are identified.There are no lesions of the pharyngeal mucosal space. No hypermetabolic thyroid activity. Incidental CT findings: Grossly stable small thyroid nodules bilaterally from previous CT. Patient underwent thyroid ultrasound 01/10/2021 and thyroid biopsy 01/29/2021. CHEST: There are multiple mildly enlarged and mildly hypermetabolic superior mediastinal, axillary and subpectoral lymph nodes bilaterally. For example, there is a left paratracheal node measuring 2.3 cm short axis on image 73/3 which has an SUV max of 4.5. 1.6 cm left subpectoral node on  image 74/3 has an SUV max of 5.3. 1.4 cm right subpectoral node on image 81/3 has an SUV max of 4.4. no hypermetabolic pulmonary activity or suspicious nodularity. Incidental CT findings: Diffuse atherosclerosis of the aorta, great vessels and coronary arteries again noted status post median sternotomy. ABDOMEN/PELVIS: There is no hypermetabolic activity within the liver, adrenal glands, spleen or pancreas. The recently demonstrated enlarged retroperitoneal and mesenteric lymph nodes in the upper abdomen are moderately hypermetabolic. The greatest metabolic activity is seen within a left mesenteric node measuring 2.0 cm on image 164/3 (SUV max 5.4). Nodal enlargement and distribution are similar to recent diagnostic CTs. No hypermetabolic pelvic adenopathy. Incidental CT findings: Hepatic and renal cysts are noted. There is aortic and branch vessel atherosclerosis and diffuse colonic diverticulosis. The prostate gland is enlarged without focal hypermetabolic activity. SKELETON: There is no hypermetabolic activity to suggest osseous metastatic disease. There is low-level hypermetabolic activity associated with the right 6th rib anteriorly (SUV max 3.4) with a probable underlying healing fracture (image 112/3). Probable adjacent healing fracture of the right 4th rib. Incidental CT findings: Previous median sternotomy. IMPRESSION: 1. The previously demonstrated thoracic and abdominal lymphadenopathy is moderately hypermetabolic. Recent serum PSA levels are normal, and the lack of pelvic adenopathy and suspicious osseous lesions make metastatic prostate cancer unlikely. Again, findings suggest a lymphoproliferative disorder. Tissue sampling recommended. 2. No suspicious hypermetabolic activity within the bones, lungs or parenchymal organs. 3. Probable healing right-sided rib fractures. 4. Coronary and Aortic Atherosclerosis (ICD10-I70.0). Electronically Signed   By: Richardean Sale M.D.   On: 07/01/2021 15:41   CT  BONE MARROW BIOPSY & ASPIRATION  Result Date: 07/05/2021 INDICATION: diffuse lymphocytosis EXAM: CT BONE MARROW BIOPSY AND ASPIRATION MEDICATIONS: None. ANESTHESIA/SEDATION: Moderate (conscious) sedation was employed during this procedure. A total  of Versed 1 mg and Fentanyl 50 mcg was administered intravenously. Moderate Sedation Time: 10 minutes. The patient's level of consciousness and vital signs were monitored continuously by radiology nursing throughout the procedure under my direct supervision. FLUOROSCOPY TIME:  N/a COMPLICATIONS: None immediate. PROCEDURE: Informed written consent was obtained from the patient after a thorough discussion of the procedural risks, benefits and alternatives. All questions were addressed. Maximal Sterile Barrier Technique was utilized including caps, mask, sterile gowns, sterile gloves, sterile drape, hand hygiene and skin antiseptic. A timeout was performed prior to the initiation of the procedure. The patient was placed prone on the CT exam table. Limited CT of the pelvis was performed for planning purposes. Skin entry site was marked, and the overlying skin was prepped and draped in the standard sterile fashion. Local analgesia was obtained with 1% lidocaine. Using CT guidance, an 11 gauge needle was advanced just deep to the cortex of the right posterior ilium. Subsequently, bone marrow aspiration and core biopsy were performed. Specimens were submitted to lab/pathology for handling. Hemostasis was achieved with manual pressure, and a clean dressing was placed. The patient tolerated the procedure well without immediate complication. IMPRESSION: Successful CT-guided bone marrow aspiration and core biopsy of the right posterior ilium. Electronically Signed   By: Albin Felling M.D.   On: 07/05/2021 11:41    LABORATORY DATA:  I have reviewed the data as listed Lab Results  Component Value Date   WBC 24.5 (H) 07/05/2021   HGB 12.5 (L) 07/05/2021   HCT 38.7 (L)  07/05/2021   MCV 91.7 07/05/2021   PLT 243 07/05/2021   Recent Labs    06/07/21 1412  NA 134*  K 5.0  CL 102  CO2 24  GLUCOSE 98  BUN 27*  CREATININE 1.01  CALCIUM 9.5  GFRNONAA >60  PROT 6.6  ALBUMIN 4.0  AST 26  ALT 7  ALKPHOS 59  BILITOT 0.7    Iron/TIBC/Ferritin/ %Sat    Component Value Date/Time   IRON 92 06/07/2021 1412   TIBC 350 06/07/2021 1412   FERRITIN 68 06/07/2021 1412   IRONPCTSAT 26 06/07/2021 1412         ASSESSMENT & PLAN:  1. 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 Goals of care, counseling/discussion   3. 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 Normal LDH, negative hepatitis panel, HIV.  Unmutated IGVH, normal celiac panel. Patient does not have constitutional symptoms, I recommend watchful waiting. I recommend labs and clinical examination at 2-69-monthintervals.  History of PSA, normal PSA   Orders Placed This Encounter  Procedures   CBC with Differential/Platelet    Standing Status:   Future    Standing Expiration Date:   07/19/2022   Comprehensive metabolic panel    Standing Status:   Future    Standing Expiration Date:   07/19/2022   Lactate dehydrogenase    Standing Status:   Future    Standing Expiration Date:   07/19/2022    All questions were answered. The patient knows to call the clinic with any problems questions or concerns.  Return of visit: 2 months  ZEarlie Server MD, PhD  07/19/2021

## 2021-08-20 ENCOUNTER — Other Ambulatory Visit: Payer: Self-pay | Admitting: *Deleted

## 2021-08-20 ENCOUNTER — Other Ambulatory Visit: Payer: Self-pay

## 2021-08-20 ENCOUNTER — Other Ambulatory Visit
Admission: RE | Admit: 2021-08-20 | Discharge: 2021-08-20 | Disposition: A | Discharge status: Home / Self Care | Payer: Medicare Other | Attending: Urology | Admitting: Urology

## 2021-08-20 ENCOUNTER — Encounter: Payer: Self-pay | Admitting: Urology

## 2021-08-20 ENCOUNTER — Ambulatory Visit: Payer: Medicare Other | Admitting: Urology

## 2021-08-20 VITALS — BP 107/61 | HR 72 | Ht 66.0 in | Wt 172.0 lb

## 2021-08-20 DIAGNOSIS — R310 Gross hematuria: Secondary | ICD-10-CM | POA: Insufficient documentation

## 2021-08-20 DIAGNOSIS — C61 Malignant neoplasm of prostate: Secondary | ICD-10-CM

## 2021-08-20 LAB — URINALYSIS, COMPLETE (UACMP) WITH MICROSCOPIC: RBC / HPF: >50 RBC/hpf (ref 0–5)

## 2021-08-20 MED ORDER — SULFAMETHOXAZOLE-TRIMETHOPRIM 800-160 MG PO TABS: 1.0000 | ORAL_TABLET | Freq: Two times a day (BID) | ORAL | 0 refills | Status: DC

## 2021-08-20 NOTE — Patient Instructions (Signed)

## 2021-08-20 NOTE — Progress Notes (Signed)
08/20/21 4:48 PM   Wesley [name] Apr 07, 1941 [id number]  CC: Gross hematuria, possible UTI, history of prostate cancer  HPI: 81 year old male here with his significant other today with 2 weeks of painless gross hematuria.  His history is notable for high risk prostate cancer treated through [facility] in [place] with external beam radiation and ADT 5 years ago, and PSA has remained very low since that time.  He was seen by his PCP on 08/06/2021 when this originally started, and urinalysis was suspicious for infection with many bacteria, greater than 50 RBCs, 4-10 WBCs, trace leuk esterase, nitrite positive, but unfortunately this was not sent for culture.  He was treated with a course of Keflex which did not improve the gross hematuria.  He denies any anticoagulants except for aspirin.  He has noticed some small blood clots in the urine.  He denies any problems voiding, dysuria, or urgency/frequency.  Notably, he was also recently diagnosed with B cell lymphoma and is pending further oncology evaluation and treatment options.  Urinalysis today with many bacteria, greater than 50 RBCs, 0-5 WBCs, 0-5 squamous cells, dipstick limited by pigment.  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 sleep study 06/03/2012 - AHI 54.74/hr during total  sleep, AHI 49.66/hr during REM   Prostate cancer ([place]) 10/2015   radiation  followed by Dr. Karsten Ro   S/P CABG x 4 2005   Stroke [facility]) 2011   damage to back of brain, difficulty when turning with ambulation   Synovitis and tenosynovitis, unspecified    Thoracic aortic aneurysm without rupture 10/2019   4.4 cm by CA 2021   Tubular adenoma of colon    Unspecified vitamin D deficiency     Surgical History: Past Surgical History:  Procedure Laterality Date   BIOPSY THYROID     HX NODULES   CARDIAC CATHETERIZATION  02/01/2004   L main with 60% prox stenosis, LAD calcified in prox section with 95% ostial lesion, diffusion 50-60% disease in mid LAD with focal 90% mid LAD stenosis; 1st diagonal coarsely irregular with up to 30% stenosis in prox & mid segments; L Cfx with 3 OMs, AV groove Cfx with 40-50% stenosis; 3rd OM with 50% prox stenosis; RCA with PDA & PLA, PLA with 95% osital lesion (Dr. Gerrie Nordmann)   CARPAL TUNNEL RELEASE     CATARACT EXTRACTION, BILATERAL     COLONOSCOPY     CORONARY ARTERY BYPASS GRAFT  02/02/2004   LIMA to DX1, RIMA to RCA, SVG to distal LAD, SVG to Cfx marginal (Dr. Remus Loffler)   EYE SURGERY Bilateral    cataract extractions   gold seed marker placement/prostate  02/28/2016   HEMORRHOID SURGERY  2002   JOINT  REPLACEMENT Left 2014   knee   JOINT REPLACEMENT Right 05/17/2018   knee   KNEE ARTHROPLASTY Right 05/17/2018   Procedure: COMPUTER ASSISTED TOTAL KNEE ARTHROPLASTY;  Surgeon: Dereck Leep, MD;  Location: [facility] ORS;  Service: Orthopedics;  Laterality: Right;   KNEE ARTHROSCOPY  01/05/2019   Procedure: ARTHROSCOPY KNEE, LYSIS OF ADHESIONS;  Surgeon: Dereck Leep, MD;  Location: [facility] OR[facility];  Service: Orthopedics;;   MASS EXCISION Left 07/08/2021   Procedure: EXCISION MASS;  Surgeon: Herbert Pun, MD;  Location: [facility] OR[facility];  Service: General;  Laterality: Left;   NM MYOCAR PERF WALL MOTION  06/2012   lexiscan myoview - normal low risk study, EF 47%    PAROTIDECTOMY Left 08/04/2017   Procedure: SUPERFICIAL PAROTIDECTOMY;  Surgeon: Beverly Gust, MD;  Location: [facility] OR[facility];  Service: ENT;  Laterality: Left;   SOFT TISSUE TUMOR RESECTION  1972   of right jaw x3, pathology unknown, possible sarcoma   TONSILLECTOMY AND ADENOIDECTOMY     TOTAL KNEE ARTHROPLASTY Left    Dr. Marry Guan [facility] [place])   TRANSTHORACIC ECHOCARDIOGRAM  06/2012   EF 50-55%, mild MR; LA mild-mod dilated; RVSP increased   UPPER GASTROINTESTINAL ENDOSCOPY  05/03/2020    Family History: Family History  Problem Relation Age of Onset   Heart disease Mother    Parkinsonism Father    Alzheimer's disease Father    Diabetes Paternal Grandfather    Stomach cancer Cousin    Colon cancer Neg Hx    Esophageal cancer Neg Hx    Rectal cancer Neg Hx    Liver cancer Neg Hx    Colon polyps Neg Hx     Social History:  reports that he has never smoked. He has never been exposed to tobacco smoke. He has never used smokeless tobacco. He reports current alcohol use. He reports that he does not use drugs.  Physical Exam: BP 107/61    Pulse 72    Ht 5\' 6"  (1.676 m)    Wt 172 lb (78 kg)    BMI 27.76 kg/m    Constitutional:  Alert and oriented, No acute distress. Cardiovascular: No clubbing, cyanosis, or edema. Respiratory: Normal respiratory effort, no increased work of breathing. GI: Abdomen is soft, nontender, nondistended, no abdominal masses  Laboratory Data: Reviewed, see HPI  Pertinent Imaging: I have personally viewed and interpreted the CT dated 06/20/2021 showing extensive lymphadenopathy in the retroperitoneum and small bowel mesentery as well as superior mediastinum consistent with his known lymphoma, large prostate measuring 100 g, no hydronephrosis or stones, possible 1 to 2 cm small subtle right bladder wall lesion.  Assessment & Plan:   81 year old male with 2 weeks of painless gross hematuria, urinalysis suspicious for infection.  History also notable for  high risk prostate cancer treated 5 years ago with radiation, and CT from December 2022 shows a possible small bladder lesion, and prostate measures 100 g.  We discussed common possible etiologies of hematuria including BPH, radiation cystitis, malignancy, urolithiasis, medical renal disease, and idiopathic. Standard workup recommended by the [facility]UA includes imaging with CT urogram to assess the upper tracts, and cystoscopy. Cytology is performed on patient's with gross hematuria to look for malignant cells in the urine.  -I recommended Bactrim DS twice daily x2 weeks for possible UTI as etiology of his gross hematuria with his urinalysis x2 suspicious for infection, follow-up urine culture -With the degree of hematuria, I also recommended cystoscopy within the next 1 to 2 weeks.  -We discussed considering  deferring cystoscopy if his hematuria completely resolves on Bactrim   I spent 65 total minutes on the day of the encounter including pre-visit review of the medical record, face-to-face time with the patient, and post visit ordering of labs/imaging/tests.  Nickolas Madrid, MD 08/20/2021  [facility] [address] [place], [place] [address] [phone]

## 2021-08-21 LAB — MISC LABCORP TEST (SEND OUT): Labcorp test code: 86884

## 2021-08-22 ENCOUNTER — Telehealth: Payer: Self-pay | Admitting: *Deleted

## 2021-08-22 LAB — URINE CULTURE: Culture: <10000 — AB

## 2021-08-22 NOTE — Telephone Encounter (Signed)
Per Dr. Tasia Catchings wants patient to postpone appointments until the completion of Bactrim. Please inform [name] and have scheduler reschedule patient. Also, to continue following up with Dr. Glori Luis with urination concerns.  ?

## 2021-08-22 NOTE — Telephone Encounter (Signed)
[name] called  stating that patient has an appointment 3/6 for labs, ut he is being started on a new antibiotic that can affect lab results and is asking if patient could come for his labs this afternoon instead  and then what to do about appointment 3/6 and 3/7. He has been urinating blood with each urination for 18 days now and he just saw Dr Glori Luis who ordered Bactrim. Please advise ?

## 2021-08-22 NOTE — Telephone Encounter (Addendum)
Call returned to [facility] and informed of doctor response. She said to reschedule for any day but Wednesday You can call Wesley [name] with the appointment times. He will be on Bactrim for 14 days. He will be having a Cysto 3/15. She asks we wait until the medicine is out of his sytem for his lab check which will be the week of 09/16/21 ?

## 2021-08-23 ENCOUNTER — Telehealth: Payer: Self-pay | Admitting: *Deleted

## 2021-08-23 NOTE — Telephone Encounter (Signed)
Schedule message sent. 

## 2021-08-23 NOTE — Telephone Encounter (Signed)
[name], patient's caregiver, called yesterday and would like to discuss his upcoming apts. Care giver would like patient's apt to be change for lab to 3/30 and apt with dr. Tasia Catchings on 4/3. Apts' changed per pt/caregiver's request. ?

## 2021-08-26 ENCOUNTER — Inpatient Hospital Stay: Payer: Medicare Other

## 2021-08-27 ENCOUNTER — Inpatient Hospital Stay: Payer: Medicare Other | Admitting: Oncology

## 2021-09-04 ENCOUNTER — Other Ambulatory Visit: Payer: Self-pay

## 2021-09-04 ENCOUNTER — Other Ambulatory Visit: Payer: Self-pay | Admitting: Urology

## 2021-09-04 ENCOUNTER — Ambulatory Visit (INDEPENDENT_AMBULATORY_CARE_PROVIDER_SITE_OTHER): Payer: Medicare Other | Admitting: Urology

## 2021-09-04 ENCOUNTER — Encounter: Payer: Self-pay | Admitting: Urology

## 2021-09-04 VITALS — BP 101/60 | HR 70 | Ht 66.0 in | Wt 171.2 lb

## 2021-09-04 DIAGNOSIS — R310 Gross hematuria: Secondary | ICD-10-CM

## 2021-09-04 DIAGNOSIS — D494 Neoplasm of unspecified behavior of bladder: Secondary | ICD-10-CM | POA: Diagnosis not present

## 2021-09-04 NOTE — Progress Notes (Signed)
Surgical Physician Order Form [facility] [place] ? ?* Scheduling expectation :  Friday, 09/13/2021 ? ?*Length of Case: 45 minutes ? ?*Clearance needed: no ? ?*Anticoagulation Instructions: Hold all anticoagulants ? ?*Aspirin Instructions: Hold Aspirin ? ?*Post-op visit Date/Instructions: 2 weeks discuss pathology ? ?*Diagnosis: Bladder Tumor ? ?*Procedure:     TURBT <2cm (18335) ? ? ?Additional orders: Gemcitabine '2000mg'$  bladder instillation ? ?-Admit type: OUTpatient ? ?-Anesthesia: General ? ?-VTE Prophylaxis Standing Order SCD?s    ?   ?Other:  ? ?-Standing Lab Orders Per Anesthesia   ? ?Lab other: UA&Urine Culture (sent 09/04/2021) ? ?-Standing Test orders EKG/Chest x-ray per Anesthesia      ? ?Test other:  ? ?- Medications:  Ancef 2gm IV ? ?-Other orders:  N/A ? ? ? ?  ? ?

## 2021-09-04 NOTE — Progress Notes (Signed)
Cystoscopy Procedure Note: ? ?Indication: Gross hematuria ? ?After informed consent and discussion of the procedure and its risks, Wesley [name] was positioned and prepped in the standard fashion. Cystoscopy was performed with a flexible cystoscope.  Urine was pink and limited vision, and 120 mL pink urine was aspirated and the bladder was refilled with saline.  The urethra, bladder neck and entire bladder was visualized in a standard fashion. The prostate was moderate in size.  Vision still limited, but clearly able to identify 1 to 2 cm papillary lesion at the right lateral bladder wall that correlated with CT findings.  Cytology sent. ? ?Imaging: ?CT with small 1 cm enhancing lesion at right lateral bladder wall ? 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 ?---------------------------------------------------------------------- ? ?Assessment and Plan: ?I had a conversation with the patient and his partner today about his findings of a small papillary bladder tumor on cystoscopy today as the most likely etiology of his gross hematuria. ? ?We discussed transurethral resection of bladder tumor (TURBT) and risks and benefits at length. This is typically a 1 to 2-hour procedure done under general anesthesia in the operating room.  A scope is inserted through the urethra and used to resect abnormal tissue within the bladder, which is then sent to the pathologist to determine grade and stage of the tumor.  Risks include bleeding, infection, need for temporary Foley placement, and bladder perforation.  Treatment strategies are based on the type of tumor and depth of invasion.  We briefly reviewed the different treatment pathways for non-muscle invasive and muscle invasive bladder cancer. ? ?Schedule TURBT and gemcitabine next week ? ?Nickolas Madrid, MD ?09/04/2021  ? ? ?

## 2021-09-04 NOTE — Patient Instructions (Signed)
Transurethral Resection of Bladder Tumor ?Transurethral resection of a bladder tumor is the removal (resection) of a cancerous growth (tumor) on the inside wall of the bladder. The bladder is the organ that holds urine. The tumor is removed through the tube that carries urine out of the body (urethra). ?In a transurethral resection, a thin telescope with a light, a tiny camera, and an electric cutting edge (resectoscope) is passed through the urethra. In men, the opening of the urethra is at the end of the penis. In women, it is just above the opening of the vagina. ?Tell a health care provider about: ?Any allergies you have. ?All medicines you are taking, including vitamins, herbs, eye drops, creams, and over-the-counter medicines. ?Any problems you or family members have had with anesthetic medicines. ?Any blood disorders you have. ?Any surgeries you have had. ?Any medical conditions you have. ?Any recent urinary tract infections you have had. ?Whether you are pregnant or may be pregnant. ?What are the risks? ?Generally, this is a safe procedure. However, problems may occur, including: ?Infection. ?Bleeding. ?Allergic reactions to medicines. ?Damage to nearby structures or organs, such as: ?The urethra. ?The tubes that drain urine from the kidneys into the bladder (ureters). ?Pain and burning during urination. ?Difficulty urinating due to partial blockage of the urethra. ?Inability to urinate (urinary retention). ?What happens before the procedure? ?Staying hydrated ?Follow instructions from your health care provider about hydration, which may include: ?Up to 2 hours before the procedure - you may continue to drink clear liquids, such as water, clear fruit juice, black coffee, and plain tea. ? ?Eating and drinking restrictions ?Follow instructions from your health care provider about eating and drinking, which may include: ?8 hours before the procedure - stop eating heavy meals or foods, such as meat, fried foods,  or fatty foods. ?6 hours before the procedure - stop eating light meals or foods, such as toast or cereal. ?6 hours before the procedure - stop drinking milk or drinks that contain milk. ?2 hours before the procedure - stop drinking clear liquids. ?Medicines ?Ask your health care provider about: ?Changing or stopping your regular medicines. This is especially important if you are taking diabetes medicines or blood thinners. ?Taking medicines such as aspirin and ibuprofen. These medicines can thin your blood. Do not take these medicines unless your health care provider tells you to take them. ?Taking over-the-counter medicines, vitamins, herbs, and supplements. ?Tests ?You may have exams or tests, including: ?Physical exam. ?Blood tests. ?Urine tests. ?Electrocardiogram (ECG). This test measures the electrical activity of the heart. ?General instructions ?Plan to have someone take you home from the hospital or clinic. ?Ask your health care provider how your surgical site will be marked or identified. ?Ask your health care provider what steps will be taken to help prevent infection. These may include: ?Washing skin with a germ-killing soap. ?Taking antibiotic medicine. ?What happens during the procedure? ?An IV will be inserted into one of your veins. ?You will be given one or more of the following: ?A medicine to help you relax (sedative). ?A medicine to make you fall asleep (general anesthetic). ?A medicine that is injected into your spine to numb the area below and slightly above the injection site (spinal anesthetic). ?Your legs will be placed in foot rests (stirrups) so that your legs are apart and your knees are bent. ?The resectoscope will be passed through your urethra and into your bladder. ?The part of your bladder that is affected by the tumor will be  resected using the cutting edge of the resectoscope. ?The resectoscope will be removed. ?A thin, flexible tube (catheter) will be passed through your urethra  and into your bladder. The catheter will drain urine into a bag outside of your body. ?Fluid may be passed through the catheter to keep the catheter open. ?The procedure may vary among health care providers and hospitals. ?What happens after the procedure? ?Your blood pressure, heart rate, breathing rate, and blood oxygen level will be monitored until you leave the hospital or clinic. ?You may continue to receive fluids and medicines through an IV. ?You will have some pain. You will be given pain medicine to relieve pain. ?You will have a catheter to drain your urine. ?You will have blood in your urine. Your catheter may be kept in until your urine is clear. ?The amount of urine will be monitored. If necessary, your bladder may be rinsed out (irrigated) by passing fluid through your catheter. ?You will be encouraged to walk around as soon as possible. ?You may have to wear compression stockings. These stockings help to prevent blood clots and reduce swelling in your legs. ?Do not drive for 24 hours if you were given a sedative during your procedure. ?Summary ?Transurethral resection of a bladder tumor is the removal (resection) of a cancerous growth (tumor) on the inside wall of the bladder. ?To do this procedure, your health care provider uses a thin telescope with a light, a tiny camera, and an electric cutting edge (resectoscope). ?Follow your health care provider's instructions. You may need to stop or change certain medicines, and you may be told to stop eating and drinking several hours before the procedure. ?Your blood pressure, heart rate, breathing rate, and blood oxygen level will be monitored until you leave the hospital or clinic. ?You may have to wear compression stockings. These stockings help to prevent blood clots and reduce swelling in your legs. ?This information is not intended to replace advice given to you by your health care provider. Make sure you discuss any questions you have with your  health care provider. ?Document Revised: 01/07/2018 Document Reviewed: 01/08/2018 ?Elsevier Patient Education ? [employer]. ? ?Transurethral Resection of Bladder Tumor, Care After ?This sheet gives you information about how to care for yourself after your procedure. Your health care provider may also give you more specific instructions. If you have problems or questions, contact your health care provider. ?What can I expect after the procedure? ?After the procedure, it is common to have: ?A small amount of blood in your urine for up to 2 weeks. ?Soreness or mild pain from your catheter. After your catheter is removed, you may have mild soreness, especially when urinating. ?Pain in your lower abdomen. ?Follow these instructions at home: ?Medicines ? ?Take over-the-counter and prescription medicines only as told by your health care provider. ?If you were prescribed an antibiotic medicine, take it as told by your health care provider. Do not stop taking the antibiotic even if you start to feel better. ?Do not drive for 24 hours if you were given a sedative during your procedure. ?Ask your health care provider if the medicine prescribed to you: ?Requires you to avoid driving or using heavy machinery. ?Can cause constipation. You may need to take these actions to prevent or treat constipation: ?Take over-the-counter or prescription medicines. ?Eat foods that are high in fiber, such as beans, whole grains, and fresh fruits and vegetables. ?Limit foods that are high in fat and processed sugars, such as  fried or sweet foods. ?Activity ?Return to your normal activities as told by your health care provider. Ask your health care provider what activities are safe for you. ?Do not lift anything that is heavier than 10 lb (4.5 kg), or the limit that you are told, until your health care provider says that it is safe. ?Avoid intense physical activity for as long as told by your health care provider. ?Rest as told by your  health care provider. ?Avoid sitting for a long time without moving. Get up to take short walks every 1-2 hours. This is important to improve blood flow and breathing. Ask for help if you feel weak or [place]

## 2021-09-05 ENCOUNTER — Telehealth: Payer: Self-pay

## 2021-09-05 NOTE — Progress Notes (Signed)
[facility] Posting Form  ? ?Surgery Date/Time: Date: 09/13/2021 ? ?Surgeon: Dr. Nickolas Madrid, MD ? ?Surgery Location: Day Surgery ? ?Inpt ( No  )   Outpt (Yes)   Obs ( No  )  ? ?Diagnosis: D49.4 Bladder Tumor ? ?-CPT: [id number], [id number] ? ?Surgery: Transurethral Resection of Bladder Tumor with intravesical instillation of Gemcitabine ? ?Stop Anticoagulations: Yes and hold ASA ? ?Cardiac/Medical/Pulmonary Clearance needed: no ? ?*Orders entered into EPIC  Date: 09/05/21  ? ?*Case booked in [place]  Date: 09/05/21 ? ?*Notified pt of Surgery: Date: 09/05/21 ? ?PRE-OP UA & CX: Yes, obtained in clinic on 09/04/2021 ? ?*Placed into Prior Authorization Work [name] Bering Date: 09/05/21 ? ? ?Assistant/laser/rep:No ? ? ? ? ? ? ? ? ? ? ? ? ? ? ? ?

## 2021-09-05 NOTE — Telephone Encounter (Signed)
I spoke with Wesley [name], [name] who is listed on [place]. We have discussed possible surgery dates and Friday March 24th, 2023 was agreed upon by all parties. Patient given information about surgery date, what to expect pre-operatively and post operatively.  ? ?We discussed that a Pre-Admission Testing office will be calling to set up the pre-op visit that will take place prior to surgery, and that these appointments are typically done over the phone with a Pre-Admissions RN. ? ? Informed patient that our office will communicate any additional care to be provided after surgery. Patients questions or concerns were discussed during our call. Advised to call our office should there be any additional information, questions or concerns that arise. Patient verbalized understanding.  ? ?

## 2021-09-05 NOTE — Telephone Encounter (Signed)
Left message for patient to call back to get scheduled for surgery.  ?

## 2021-09-06 LAB — MICROSCOPIC EXAMINATION: RBC, Urine: >30 /hpf — AB (ref 0–2)

## 2021-09-06 LAB — CYTOLOGY - NON PAP

## 2021-09-06 LAB — URINALYSIS, COMPLETE

## 2021-09-10 ENCOUNTER — Other Ambulatory Visit: Payer: Self-pay

## 2021-09-10 ENCOUNTER — Encounter
Admission: RE | Admit: 2021-09-10 | Discharge: 2021-09-10 | Disposition: A | Discharge status: Home / Self Care | Payer: Medicare Other | Source: Ambulatory Visit | Attending: Urology | Admitting: Urology

## 2021-09-10 NOTE — Pre-Procedure Instructions (Signed)
Chart review only. Spoke to Wesley [name] pt's SO. Denies changes to medications medical and surgical history with exception to current issue. Instructions to be located in My Chart After Visit Summary for today's date 09/10/21. Wesley [name] aware to call our office for any questions regarding instructions.

## 2021-09-10 NOTE — Patient Instructions (Addendum)
Your procedure is scheduled on: 09/13/21 Report to [facility]. To find out your arrival time please call [phone] between 1PM - 3PM on 09/12/21.  Remember: Instructions that are not followed completely may result in serious medical risk, up to and including death, or upon the discretion of your surgeon and anesthesiologist your surgery may need to be rescheduled.     _X__ 1. Do not eat food or drink any liquids after midnight the night before your procedure.                 No gum chewing or hard candies.   __X__2.  On the morning of surgery brush your teeth with toothpaste and water, you                 may rinse your mouth with mouthwash if you wish.  Do not swallow any              toothpaste of mouthwash.     _X__ 3.  No Alcohol for 24 hours before or after surgery.   _X__ 4.  Do Not Smoke or use e-cigarettes For 24 Hours Prior to Your Surgery.                 Do not use any chewable tobacco products for at least 6 hours prior to                 surgery.  ____  5.  Bring all medications with you on the day of surgery if instructed.   __X__  6.  Notify your doctor if there is any change in your medical condition      (cold, fever, infections).     Do not wear jewelry, make-up, hairpins, clips or nail polish. Do not wear lotions, powders, or perfumes. You may wear deodorant Do not shave body hair 48 hours prior to surgery. Men may shave face and neck. Do not bring valuables to the hospital.    [facility] is not responsible for any belongings or valuables.  Contacts, dentures/partials or body piercings may not be worn into surgery. Bring a case for your contacts, glasses or hearing aids, a denture cup will be supplied. Leave your suitcase in the car. After surgery it may be brought to your room. For patients admitted to the hospital, discharge time is determined by your treatment team.   Patients discharged the day of  surgery will not be allowed to drive home.   Please read over the following fact sheets that you were given:     __X__ Take these medicines the morning of surgery with A SIP OF WATER:    1. carbidopa-levodopa (SINEMET IR) 25-100 MG tablet  2. carvedilol (COREG) 6.25 MG tablet  3.   4.  5.  6.  ____ Fleet Enema (as directed)   ____ Use CHG Soap/SAGE wipes as directed  ____ Use inhalers on the day of surgery  ____ Stop metformin/Janumet/Farxiga 2 days prior to surgery    ____ Take 1/2 of usual insulin dose the night before surgery. No insulin the morning          of surgery.   ____ Stop Blood Thinners Coumadin/Plavix/Xarelto/Pleta/Pradaxa/Eliquis/Effient/Aspirin  on   Or contact your Surgeon, Cardiologist or Medical Doctor regarding  ability to stop your blood thinners  __X__ Stop Anti-inflammatories 7 days before surgery such as Advil, Ibuprofen, Motrin,  BC or Goodies Powder, Naprosyn, Naproxen, Aleve  You may use Tylenol if needed  __X__ Stop all herbals and supplements, fish oil or vitamins for now until after surgery.    ____ Bring C-Pap to the hospital.    You do not have to use the surgical soap. A shower as normal the morning of your procedure will be ok.

## 2021-09-11 LAB — CULTURE, URINE COMPREHENSIVE

## 2021-09-13 ENCOUNTER — Ambulatory Visit: Payer: Medicare Other | Admitting: Urgent Care

## 2021-09-13 ENCOUNTER — Encounter
Admission: RE | Disposition: A | Discharge status: Home / Self Care | Payer: Self-pay | Source: Home / Self Care | Attending: Urology

## 2021-09-13 ENCOUNTER — Encounter: Payer: Self-pay | Admitting: Urology

## 2021-09-13 ENCOUNTER — Ambulatory Visit
Admission: RE | Admit: 2021-09-13 | Discharge: 2021-09-13 | Disposition: A | Discharge status: Home / Self Care | Payer: Medicare Other | Attending: Urology | Admitting: Urology

## 2021-09-13 ENCOUNTER — Ambulatory Visit: Payer: Medicare Other

## 2021-09-13 ENCOUNTER — Other Ambulatory Visit: Payer: Self-pay

## 2021-09-13 DIAGNOSIS — R310 Gross hematuria: Secondary | ICD-10-CM | POA: Diagnosis present

## 2021-09-13 DIAGNOSIS — K219 Gastro-esophageal reflux disease without esophagitis: Secondary | ICD-10-CM | POA: Insufficient documentation

## 2021-09-13 DIAGNOSIS — Z951 Presence of aortocoronary bypass graft: Secondary | ICD-10-CM | POA: Insufficient documentation

## 2021-09-13 DIAGNOSIS — I2510 Atherosclerotic heart disease of native coronary artery without angina pectoris: Secondary | ICD-10-CM | POA: Diagnosis not present

## 2021-09-13 DIAGNOSIS — G20 Parkinson's disease: Secondary | ICD-10-CM | POA: Insufficient documentation

## 2021-09-13 DIAGNOSIS — I509 Heart failure, unspecified: Secondary | ICD-10-CM | POA: Diagnosis not present

## 2021-09-13 DIAGNOSIS — C672 Malignant neoplasm of lateral wall of bladder: Secondary | ICD-10-CM | POA: Insufficient documentation

## 2021-09-13 DIAGNOSIS — I080 Rheumatic disorders of both mitral and aortic valves: Secondary | ICD-10-CM | POA: Insufficient documentation

## 2021-09-13 DIAGNOSIS — C8510 Unspecified B-cell lymphoma, unspecified site: Secondary | ICD-10-CM | POA: Diagnosis not present

## 2021-09-13 DIAGNOSIS — I69993 Ataxia following unspecified cerebrovascular disease: Secondary | ICD-10-CM | POA: Diagnosis not present

## 2021-09-13 DIAGNOSIS — E785 Hyperlipidemia, unspecified: Secondary | ICD-10-CM | POA: Insufficient documentation

## 2021-09-13 DIAGNOSIS — Z8546 Personal history of malignant neoplasm of prostate: Secondary | ICD-10-CM | POA: Diagnosis not present

## 2021-09-13 DIAGNOSIS — D494 Neoplasm of unspecified behavior of bladder: Secondary | ICD-10-CM

## 2021-09-13 DIAGNOSIS — I110 Hypertensive heart disease with heart failure: Secondary | ICD-10-CM | POA: Diagnosis not present

## 2021-09-13 HISTORY — PX: CYSTOSCOPY W/ RETROGRADES: SHX1426

## 2021-09-13 HISTORY — PX: TRANSURETHRAL RESECTION OF BLADDER TUMOR: SHX2575

## 2021-09-13 HISTORY — PX: BLADDER INSTILLATION: SHX6893

## 2021-09-13 SURGERY — TURBT (TRANSURETHRAL RESECTION OF BLADDER TUMOR)
Anesthesia: General

## 2021-09-13 MED ORDER — GEMCITABINE CHEMO FOR BLADDER INSTILLATION 2000 MG
INTRAVENOUS | Status: DC | PRN
  Administered 2021-09-13: 2000 mg via INTRAVESICAL

## 2021-09-13 MED ORDER — FENTANYL CITRATE (PF) 100 MCG/2ML IJ SOLN
INTRAMUSCULAR | Status: AC
  Filled 2021-09-13: qty 2

## 2021-09-13 MED ORDER — ROCURONIUM BROMIDE 10 MG/ML (PF) SYRINGE
PREFILLED_SYRINGE | INTRAVENOUS | Status: AC
  Filled 2021-09-13: qty 10

## 2021-09-13 MED ORDER — PROPOFOL 10 MG/ML IV BOLUS
INTRAVENOUS | Status: AC
  Filled 2021-09-13: qty 20

## 2021-09-13 MED ORDER — ONDANSETRON HCL 4 MG/2ML IJ SOLN
INTRAMUSCULAR | Status: AC
Start: 2021-09-13 — End: ?
  Filled 2021-09-13: qty 2

## 2021-09-13 MED ORDER — DEXAMETHASONE SODIUM PHOSPHATE 10 MG/ML IJ SOLN
INTRAMUSCULAR | Status: DC | PRN
  Administered 2021-09-13: 5 mg via INTRAVENOUS

## 2021-09-13 MED ORDER — CEFAZOLIN SODIUM-DEXTROSE 2-4 GM/100ML-% IV SOLN
2.0000 g | INTRAVENOUS | Status: AC
  Administered 2021-09-13: 2 g via INTRAVENOUS

## 2021-09-13 MED ORDER — DEXAMETHASONE SODIUM PHOSPHATE 10 MG/ML IJ SOLN
INTRAMUSCULAR | Status: AC
  Filled 2021-09-13: qty 1

## 2021-09-13 MED ORDER — ONDANSETRON HCL 4 MG/2ML IJ SOLN
INTRAMUSCULAR | Status: DC | PRN
  Administered 2021-09-13: 4 mg via INTRAVENOUS

## 2021-09-13 MED ORDER — ACETAMINOPHEN 10 MG/ML IV SOLN: 1000.0000 mg | Freq: Once | INTRAVENOUS | Status: DC | PRN

## 2021-09-13 MED ORDER — FENTANYL CITRATE (PF) 100 MCG/2ML IJ SOLN
INTRAMUSCULAR | Status: DC | PRN
  Administered 2021-09-13 (×2): 50 ug via INTRAVENOUS

## 2021-09-13 MED ORDER — EPHEDRINE 5 MG/ML INJ
INTRAVENOUS | Status: AC
  Filled 2021-09-13: qty 5

## 2021-09-13 MED ORDER — ACETAMINOPHEN 10 MG/ML IV SOLN
INTRAVENOUS | Status: AC
  Filled 2021-09-13: qty 100

## 2021-09-13 MED ORDER — FAMOTIDINE 20 MG PO TABS
ORAL_TABLET | ORAL | Status: AC
  Administered 2021-09-13: 20 mg via ORAL
  Filled 2021-09-13: qty 1

## 2021-09-13 MED ORDER — IOHEXOL 180 MG/ML  SOLN
INTRAMUSCULAR | Status: DC | PRN
  Administered 2021-09-13: 10 mL

## 2021-09-13 MED ORDER — FAMOTIDINE 20 MG PO TABS: 20.0000 mg | ORAL_TABLET | Freq: Once | ORAL | Status: AC

## 2021-09-13 MED ORDER — LIDOCAINE HCL (PF) 2 % IJ SOLN
INTRAMUSCULAR | Status: AC
  Filled 2021-09-13: qty 5

## 2021-09-13 MED ORDER — ORAL CARE MOUTH RINSE: 15.0000 mL | Freq: Once | OROMUCOSAL | Status: AC

## 2021-09-13 MED ORDER — OXYCODONE HCL 5 MG/5ML PO SOLN: 5.0000 mg | Freq: Once | ORAL | Status: DC | PRN

## 2021-09-13 MED ORDER — PROPOFOL 10 MG/ML IV BOLUS
INTRAVENOUS | Status: DC | PRN
  Administered 2021-09-13: 80 mg via INTRAVENOUS

## 2021-09-13 MED ORDER — ACETAMINOPHEN 10 MG/ML IV SOLN
INTRAVENOUS | Status: DC | PRN
  Administered 2021-09-13: 1000 mg via INTRAVENOUS

## 2021-09-13 MED ORDER — CHLORHEXIDINE GLUCONATE 0.12 % MT SOLN
OROMUCOSAL | Status: AC
  Administered 2021-09-13: 15 mL via OROMUCOSAL
  Filled 2021-09-13: qty 15

## 2021-09-13 MED ORDER — CEFAZOLIN SODIUM-DEXTROSE 2-4 GM/100ML-% IV SOLN
INTRAVENOUS | Status: AC
  Filled 2021-09-13: qty 100

## 2021-09-13 MED ORDER — CHLORHEXIDINE GLUCONATE 0.12 % MT SOLN: 15.0000 mL | Freq: Once | OROMUCOSAL | Status: AC

## 2021-09-13 MED ORDER — GEMCITABINE CHEMO FOR BLADDER INSTILLATION 2000 MG
2000.0000 mg | Freq: Once | INTRAVENOUS | Status: DC
  Filled 2021-09-13: qty 52.6

## 2021-09-13 MED ORDER — ROCURONIUM BROMIDE 100 MG/10ML IV SOLN
INTRAVENOUS | Status: DC | PRN
  Administered 2021-09-13: 50 mg via INTRAVENOUS

## 2021-09-13 MED ORDER — FENTANYL CITRATE (PF) 100 MCG/2ML IJ SOLN
25.0000 ug | INTRAMUSCULAR | Status: DC | PRN
  Administered 2021-09-13: 25 ug via INTRAVENOUS

## 2021-09-13 MED ORDER — STERILE WATER FOR IRRIGATION IR SOLN
Status: DC | PRN
  Administered 2021-09-13: 3000 mL

## 2021-09-13 MED ORDER — LIDOCAINE HCL (CARDIAC) PF 100 MG/5ML IV SOSY
PREFILLED_SYRINGE | INTRAVENOUS | Status: DC | PRN
  Administered 2021-09-13: 100 mg via INTRAVENOUS

## 2021-09-13 MED ORDER — LACTATED RINGERS IV SOLN: INTRAVENOUS | Status: DC

## 2021-09-13 MED ORDER — EPHEDRINE SULFATE-NACL 50-0.9 MG/10ML-% IV SOSY
PREFILLED_SYRINGE | INTRAVENOUS | Status: DC | PRN
  Administered 2021-09-13 (×2): 5 mg via INTRAVENOUS

## 2021-09-13 MED ORDER — MIDAZOLAM HCL 2 MG/2ML IJ SOLN
INTRAMUSCULAR | Status: AC
  Filled 2021-09-13: qty 2

## 2021-09-13 MED ORDER — PHENYLEPHRINE HCL (PRESSORS) 10 MG/ML IV SOLN
INTRAVENOUS | Status: DC | PRN
  Administered 2021-09-13: 160 ug via INTRAVENOUS
  Administered 2021-09-13: 80 ug via INTRAVENOUS

## 2021-09-13 MED ORDER — ONDANSETRON HCL 4 MG/2ML IJ SOLN: 4.0000 mg | Freq: Once | INTRAMUSCULAR | Status: DC | PRN

## 2021-09-13 MED ORDER — SUGAMMADEX SODIUM 200 MG/2ML IV SOLN
INTRAVENOUS | Status: DC | PRN
  Administered 2021-09-13: 200 mg via INTRAVENOUS

## 2021-09-13 MED ORDER — OXYCODONE HCL 5 MG PO TABS: 5.0000 mg | ORAL_TABLET | Freq: Once | ORAL | Status: DC | PRN

## 2021-09-13 SURGICAL SUPPLY — 34 items
BAG DRAIN CYSTO-URO LG1000N (MISCELLANEOUS) ×3 IMPLANT
BAG DRN RND TRDRP ANRFLXCHMBR (UROLOGICAL SUPPLIES) ×2
BAG URINE DRAIN 2000ML AR STRL (UROLOGICAL SUPPLIES) ×3 IMPLANT
BRUSH SCRUB EZ  4% CHG (MISCELLANEOUS) ×3
BRUSH SCRUB EZ 4% CHG (MISCELLANEOUS) ×2 IMPLANT
CATH FOL 2WAY LX 18X30 (CATHETERS) ×3 IMPLANT
CATH URETL OPEN 5X70 (CATHETERS) ×1 IMPLANT
DRAPE UTILITY 15X26 TOWEL STRL (DRAPES) ×3 IMPLANT
DRSG TELFA 4X3 1S NADH ST (GAUZE/BANDAGES/DRESSINGS) ×3 IMPLANT
ELECT LOOP 22F BIPOLAR SML (ELECTROSURGICAL)
ELECT REM PT RETURN 9FT ADLT (ELECTROSURGICAL)
ELECTRODE LOOP 22F BIPOLAR SML (ELECTROSURGICAL) IMPLANT
ELECTRODE REM PT RTRN 9FT ADLT (ELECTROSURGICAL) IMPLANT
GAUZE 4X4 16PLY ~~LOC~~+RFID DBL (SPONGE) ×6 IMPLANT
GLOVE SURG UNDER POLY LF SZ7.5 (GLOVE) ×3 IMPLANT
GOWN STRL REUS W/ TWL LRG LVL3 (GOWN DISPOSABLE) ×2 IMPLANT
GOWN STRL REUS W/ TWL XL LVL3 (GOWN DISPOSABLE) ×2 IMPLANT
GOWN STRL REUS W/TWL LRG LVL3 (GOWN DISPOSABLE) ×3
GOWN STRL REUS W/TWL XL LVL3 (GOWN DISPOSABLE) ×3
GUIDEWIRE STR DUAL SENSOR (WIRE) ×1 IMPLANT
IV NS IRRIG 3000ML ARTHROMATIC (IV SOLUTION) ×6 IMPLANT
KIT TURNOVER CYSTO (KITS) ×3 IMPLANT
LOOP CUT BIPOLAR 24F LRG (ELECTROSURGICAL) IMPLANT
MANIFOLD NEPTUNE II (INSTRUMENTS) ×3 IMPLANT
NDL SAFETY ECLIPSE 18X1.5 (NEEDLE) ×2 IMPLANT
NEEDLE HYPO 18GX1.5 SHARP (NEEDLE) ×3
PACK CYSTO AR (MISCELLANEOUS) ×3 IMPLANT
PAD ARMBOARD 7.5X6 YLW CONV (MISCELLANEOUS) ×3 IMPLANT
SET IRRIG Y TYPE TUR BLADDER L (SET/KITS/TRAYS/PACK) ×3 IMPLANT
SURGILUBE 2OZ TUBE FLIPTOP (MISCELLANEOUS) ×3 IMPLANT
SYR TOOMEY IRRIG 70ML (MISCELLANEOUS) ×3
SYRINGE TOOMEY IRRIG 70ML (MISCELLANEOUS) ×2 IMPLANT
WATER STERILE IRR 1000ML POUR (IV SOLUTION) ×3 IMPLANT
WATER STERILE IRR 500ML POUR (IV SOLUTION) ×3 IMPLANT

## 2021-09-13 NOTE — Progress Notes (Signed)
Foley unclamped per order. Will removed per order once bladder is drained. ?

## 2021-09-13 NOTE — Op Note (Signed)
Date of procedure: 09/13/21 ? ?Preoperative diagnosis:  ?Gross hematuria ?Bladder tumor ? ?Postoperative diagnosis:  ?Same ? ?Procedure: ?Cystoscopy, bilateral retrograde pyelograms with intraoperative interpretation ?Bladder biopsy and fulguration of 2 cm right lateral wall tumor ?Postop instillation of gemcitabine ? ?Surgeon: Nickolas Madrid, MD ? ?Anesthesia: General ? ?Complications: None ? ?Intraoperative findings:  ?Moderate size prostate, moderate bladder trabeculations, ureteral orifices orthotopic bilaterally ?Single 2 cm papillary tumor at the right lateral wall just superior to the ureteral orifice ?Normal retrograde pyelograms with no filling defects ?Tumor completely removed with cold cup biopsy forceps, and meticulous fulguration, ureters intact at conclusion of case ? ?EBL: Minimal ? ?Specimens: Bladder tumor ? ?Drains: 76 French Foley ? ?Indication: Wesley [name] is a 81 y.o. patient with gross hematuria who was found to have a 2 cm papillary bladder tumor on clinic cystoscopy.  After reviewing the management options for treatment, they elected to proceed with the above surgical procedure(s). We have discussed the potential benefits and risks of the procedure, side effects of the proposed treatment, the likelihood of the patient achieving the goals of the procedure, and any potential problems that might occur during the procedure or recuperation. Informed consent has been obtained. ? ?Description of procedure: ? ?The patient was taken to the operating room and general anesthesia was induced. SCDs were placed for DVT prophylaxis. The patient was placed in the dorsal lithotomy position, prepped and draped in the usual sterile fashion, and preoperative antibiotics were administered. A preoperative time-out was performed.  ? ?A 21 French rigid cystoscope was used to intubate the urethra and a normal-appearing urethra was followed proximally to the bladder.  The prostate was moderate in size.   Thorough cystoscopy showed pink urine and this was irrigated free along with some small old clot material less than 10 mL.  There were moderate bladder trabeculations, and the ureteral orifices were orthotopic bilaterally.  There was a single solitary 2 cm papillary tumor at the right lateral wall just superior to the right ureteral orifice. ? ?The cold cup biopsy forceps were used to remove the tumor completely.  The Bugbee was then used for meticulous hemostasis with careful fulguration around the ureter.  There was excellent hemostasis with the bladder decompressed. ? ?A retrograde pyelogram with a 5 French access catheter was performed on the left side and showed no filling defects or abnormalities.  Identical procedure was performed on the right side which also showed no hydronephrosis or other filling defects or abnormalities. ? ?A 18 French Foley was placed with return of clear fluid, and 10 mL were placed in the balloon.  The bladder was irrigated thoroughly with sterile water.  The bladder was drained, and 2 g/50 mL gemcitabine were instilled into the catheter, and the catheter clamped. ? ?Disposition: Stable to PACU ? ?Plan: ?Unclamp Foley at 420pm and allow gemcitabine to drain, Foley can be removed prior to discharge ?Follow-up in clinic in 2 weeks to discuss pathology results ? ?Nickolas Madrid, MD ? ?

## 2021-09-13 NOTE — Anesthesia Preprocedure Evaluation (Signed)
Anesthesia Evaluation  ?Patient identified by MRN, date of birth, ID band ?Patient awake ? ? ? ?Reviewed: ?Allergy & Precautions, NPO status , Patient's Chart, lab work & pertinent test results ? ?History of Anesthesia Complications ?Negative for: history of anesthetic complications ? ?Airway ?Mallampati: III ? ?TM Distance: >3 FB ?Neck ROM: Full ? ? ? Dental ? ?(+) Poor Dentition, Missing,  ?  ?Pulmonary ?sleep apnea , neg COPD, Patient abstained from smoking.Not current smoker,  ?  ?Pulmonary exam normal ?breath sounds clear to auscultation ? ? ? ? ? ? Cardiovascular ?Exercise Tolerance: Good ?METShypertension, Pt. 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  ?MODERATE LV SYSTOLIC DYSFUNCTION WITH AN ESTIMATED EF = 35-40 %  ?NORMAL RIGHT VENTRICULAR SYSTOLIC FUNCTION  ?MODERATE MITRAL VALVE INSUFFICIENCY  ?MILD-TO-MODERATE AORTIC VALVE INSUFFICIENCY  ?MILD TRICUSPID VALVE INSUFFICIENCY  ?NO VALVULAR STENOSIS  ?MILD RV ENLARGEMENT  ?MILD BIATRIAL ENLARGEMENT  ?DILATED AORTIC ROOT AND ASCENDING AORTA MEASURING UP TO 4.2 cm  ? ?  ?Neuro/Psych ?Vertigo, chronic back pain ? Neuromuscular disease (Parkinsonism) CVA (2011, residual balance deficit) negative psych ROS  ? GI/Hepatic ?GERD  ,(+)  ?  ? (-) substance abuse ? ,   ?Endo/Other  ?negative endocrine ROSneg diabetes ? Renal/GU ?negative Renal ROS  ? ?  ?Musculoskeletal ? ?(+) Arthritis ,  ? Abdominal ?  ?Peds ? Hematology ?Prostate, jaw, colon cancers   ?Anesthesia Other Findings ?Reviewed 04/11/21 cardiology note - well controlled chf. ? 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    Comment:  LVEF 35% BY echo 02-2019 ?No date: Chronic  back pain ?    Comment:  no surgery ?No date: Coronary atherosclerosis of autologous vein bypass graft ?No date: Dysrhythmia ?    Comment:  irregular but not specifically diagnosed ?No date: Essential hypertension, benign ?    Comment:  pt denies 04/07/14 ?No date: GERD (gastroesophageal reflux disease) ?No date: Hyperlipidemia ?No date: Intermittent vertigo ?No date: Labral tear of shoulder ?    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    Comment:  ultrasound, TSH 1.324 07-16-18 ?No date: OSA (obstructive sleep apnea) ?    Comment:  NOP CPAP. sleep study 06/03/2012 - AHI 54.74/hr during  ?             total sleep, AHI 49.66/hr during REM ?10/2015: Prostate cancer ([place]) ?    Comment:  radiation  followed by Dr. 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    Comment:  damage to back of brain, difficulty when turning with  ?             ambulation ?No date: Synovitis and tenosynovitis, unspecified ?10/2019: Thoracic aortic aneurysm without rupture ?    Comment:  4.4 cm by CA 2021 ?No date: Tubular adenoma of colon ?No date: Unspecified vitamin D deficiency ? ? Reproductive/Obstetrics ? ?  ? ? ? ? ? ? ? ? ? ? ? ? ? ?  ?  ? ? ? ? ? ? ? ? ?Anesthesia Physical ? ?Anesthesia Plan ? ?ASA: 3 ? ?Anesthesia Plan: General  ? ?Post-op Pain Management: Ofirmev IV (intra-op)*  ? ?Induction: Intravenous ? ?PONV Risk Score  and Plan: 3 and Ondansetron, Treatment may vary due to age or medical condition and Dexamethasone ? ?Airway Management Planned: Natural Airway ? ?Additional Equipment: None ? ?Intra-op Plan:  ? ?Post-operative Plan: Extubation in OR ? ?Informed Consent: I have reviewed the patients History and Physical, chart, labs and discussed the procedure including the risks, benefits and alternatives for the proposed anesthesia with the patient or authorized representative who has indicated his/her understanding and acceptance.  ? ? ? ?Dental advisory  given ? ?Plan Discussed with: CRNA ? ?Anesthesia Plan Comments: (Discussed risks of anesthesia with patient, including PONV, sore throat, lip/dental/eye damage. Rare risks discussed as well, such as cardiorespiratory and neurological sequelae, and allergic reactions. Discussed the role of CRNA in patient's perioperative care. Patient understands.)  ? ? ? ? ? ? ?Anesthesia Quick Evaluation ? ?

## 2021-09-13 NOTE — Anesthesia Procedure Notes (Signed)
Procedure Name: Intubation ?Date/Time: 09/13/2021 2:49 PM ?Performed by: Cammie Sickle, CRNA ?Pre-anesthesia Checklist: Patient identified, Patient being monitored, Timeout performed, Emergency Drugs available and Suction available ?Patient Re-evaluated:Patient Re-evaluated prior to induction ?Oxygen Delivery Method: Circle system utilized ?Preoxygenation: Pre-oxygenation with 100% oxygen ?Induction Type: IV induction ?Ventilation: Mask ventilation without difficulty ?Laryngoscope Size: 3 and McGraph ?Grade View: Grade I ?Tube type: Oral ?Tube size: 7.0 mm ?Number of attempts: 1 ?Airway Equipment and Method: Stylet ?Placement Confirmation: ETT inserted through vocal cords under direct vision, positive ETCO2 and breath sounds checked- equal and bilateral ?Secured at: 22 cm ?Tube secured with: Tape ?Dental Injury: Teeth and Oropharynx as per pre-operative assessment  ? ? ? ? ?

## 2021-09-13 NOTE — Discharge Instructions (Signed)
AMBULATORY SURGERY  ?DISCHARGE INSTRUCTIONS ? ? ?The drugs that you were given will stay in your system until tomorrow so for the next 24 hours you should not: ? ?Drive an automobile ?Make any legal decisions ?Drink any alcoholic beverage ? ? ?You may resume regular meals tomorrow.  Today it is better to start with liquids and gradually work up to solid foods. ? ?You may eat anything you prefer, but it is better to start with liquids, then soup and crackers, and gradually work up to solid foods. ? ? ?Please notify your doctor immediately if you have any unusual bleeding, trouble breathing, redness and pain at the surgery site, drainage, fever, or pain not relieved by medication. ? ? ? ?Additional Instructions: ? ? ? ?Please contact your physician with any problems or Same Day Surgery at [phone], Monday through Friday 6 am to 4 pm, or [place] [facility] [place] [facility] number at [phone].  ?

## 2021-09-13 NOTE — Transfer of Care (Signed)
Immediate Anesthesia Transfer of Care Note ? ?Patient: Wesley [name] ? ?Procedure(s) Performed: TRANSURETHRAL RESECTION OF BLADDER TUMOR (TURBT) ?BLADDER INSTILLATION OF GEMCITABINE ?CYSTOSCOPY WITH RETROGRADE PYELOGRAM ? ?Patient Location: PACU ? ?Anesthesia Type:General ? ?Level of Consciousness: awake ? ?Airway & Oxygen Therapy: Patient Spontanous Breathing ? ?Post-op Assessment: Report given to RN and Post -op Vital signs reviewed and stable ? ?Post vital signs: Reviewed and stable ? ?Last Vitals:  ?Vitals Value Taken Time  ?BP 125/73 09/13/21 1530  ?Temp 36.4 ?C 09/13/21 1529  ?Pulse 63 09/13/21 1537  ?Resp 18 09/13/21 1537  ?SpO2 97 % 09/13/21 1537  ?Vitals shown include unvalidated device data. ? ?Last Pain:  ?Vitals:  ? 09/13/21 1116  ?TempSrc: Temporal  ?PainSc: 0-No pain  ?   ? ?  ? ?Complications: No notable events documented. ?

## 2021-09-13 NOTE — Interval H&P Note (Signed)
UROLOGY H&P UPDATE ? ?Agree with prior H&P dated 08/20/2021.  81 year old male with ongoing hematuria and found to have a small lateral wall bladder tumor on clinic cystoscopy. ? ?Cardiac: RRR ?Lungs: CTA bilaterally ? ?Laterality: N/A ?Procedure: Cystoscopy/bladder biopsy/fulguration, TURBT, gemcitabine ? ?We discussed transurethral resection of bladder tumor (TURBT) and risks and benefits at length. This is typically a 1 to 2-hour procedure done under general anesthesia in the operating room.  A scope is inserted through the urethra and used to resect abnormal tissue within the bladder, which is then sent to the pathologist to determine grade and stage of the tumor.  Risks include bleeding, infection, need for temporary Foley placement, and bladder perforation.  Treatment strategies are based on the type of tumor and depth of invasion.  We briefly reviewed the different treatment pathways for non-muscle invasive and muscle invasive bladder cancer. ? ? ? ?Billey Co, MD ?09/13/2021 ? ? ? ?

## 2021-09-16 ENCOUNTER — Other Ambulatory Visit: Payer: Medicare Other

## 2021-09-16 ENCOUNTER — Encounter: Payer: Self-pay | Admitting: Urology

## 2021-09-16 NOTE — Anesthesia Postprocedure Evaluation (Signed)
Anesthesia Post Note ? ?Patient: SHULEM MADER ? ?Procedure(s) Performed: TRANSURETHRAL RESECTION OF BLADDER TUMOR (TURBT) ?BLADDER INSTILLATION OF GEMCITABINE ?CYSTOSCOPY WITH RETROGRADE PYELOGRAM ? ?Patient location during evaluation: PACU ?Anesthesia Type: General ?Level of consciousness: awake and alert ?Pain management: pain level controlled ?Vital Signs Assessment: post-procedure vital signs reviewed and stable ?Respiratory status: spontaneous breathing, nonlabored ventilation, respiratory function stable and patient connected to nasal cannula oxygen ?Cardiovascular status: blood pressure returned to baseline and stable ?Postop Assessment: no apparent nausea or vomiting ?Anesthetic complications: no ? ? ?No notable events documented. ? ? ?Last Vitals:  ?Vitals:  ? 09/13/21 1620 09/13/21 1644  ?BP:  114/61  ?Pulse: 66 65  ?Resp: 17 18  ?Temp: 36.9 ?C (!) 36.3 ?C  ?SpO2: 92% 93%  ?  ?Last Pain:  ?Vitals:  ? 09/13/21 1644  ?TempSrc: Temporal  ?PainSc: 0-No pain  ? ? ?  ?  ?  ?  ?  ?  ? ?Precious Haws Rahi Chandonnet ? ? ? ? ?

## 2021-09-17 ENCOUNTER — Ambulatory Visit: Payer: Medicare Other | Admitting: Oncology

## 2021-09-17 LAB — SURGICAL PATHOLOGY

## 2021-09-18 ENCOUNTER — Telehealth: Payer: Self-pay

## 2021-09-18 NOTE — Telephone Encounter (Signed)
I tried to call patient to set up 2 week follow up appt. No answer and voicemail is full, have made an appt for April 6th. Will try again.  ?

## 2021-09-19 ENCOUNTER — Telehealth: Payer: Self-pay

## 2021-09-19 NOTE — Telephone Encounter (Signed)
-----   Message from Billey Co, MD sent at 09/18/2021  5:09 PM EDT ----- ?Good news, biopsy showed just a very low-grade and noninvasive bladder tumor like we suspected and were hoping for.  We will discuss further at follow-up next week ? ?Nickolas Madrid, MD ?09/18/2021 ? ? ?

## 2021-09-19 NOTE — Telephone Encounter (Signed)
Called pt no answer. Unable to leave message as voicemail is full. Sent Mychart message.  ?

## 2021-09-20 ENCOUNTER — Inpatient Hospital Stay: Payer: Medicare Other | Attending: Oncology

## 2021-09-20 DIAGNOSIS — C8510 Unspecified B-cell lymphoma, unspecified site: Secondary | ICD-10-CM

## 2021-09-20 DIAGNOSIS — C8590 Non-Hodgkin lymphoma, unspecified, unspecified site: Secondary | ICD-10-CM | POA: Insufficient documentation

## 2021-09-20 LAB — CBC WITH DIFFERENTIAL/PLATELET
Abs Immature Granulocytes: 0.01 10*3/uL (ref 0.00–0.07)
Basophils Absolute: 0.1 10*3/uL (ref 0.0–0.1)
Basophils Relative: 0 %
Eosinophils Absolute: 0.1 10*3/uL (ref 0.0–0.5)
Eosinophils Relative: 1 %
HCT: 34.1 % — ABNORMAL LOW (ref 39.0–52.0)
Hemoglobin: 11 g/dL — ABNORMAL LOW (ref 13.0–17.0)
Immature Granulocytes: 0 %
Lymphocytes Relative: 81 %
Lymphs Abs: 13.8 10*3/uL — ABNORMAL HIGH (ref 0.7–4.0)
MCH: 30.3 pg (ref 26.0–34.0)
MCHC: 32.3 g/dL (ref 30.0–36.0)
MCV: 93.9 fL (ref 80.0–100.0)
Monocytes Absolute: 0.8 10*3/uL (ref 0.1–1.0)
Monocytes Relative: 5 %
Neutro Abs: 2.2 10*3/uL (ref 1.7–7.7)
Neutrophils Relative %: 13 %
Platelets: 180 10*3/uL (ref 150–400)
RBC: 3.63 MIL/uL — ABNORMAL LOW (ref 4.22–5.81)
RDW: 13 % (ref 11.5–15.5)
Smear Review: NORMAL
WBC: 17 10*3/uL — ABNORMAL HIGH (ref 4.0–10.5)
nRBC: 0 % (ref 0.0–0.2)

## 2021-09-20 LAB — COMPREHENSIVE METABOLIC PANEL
ALT: 24 U/L (ref 0–44)
AST: 26 U/L (ref 15–41)
Albumin: 3.9 g/dL (ref 3.5–5.0)
Alkaline Phosphatase: 63 U/L (ref 38–126)
Anion gap: 6 (ref 5–15)
BUN: 19 mg/dL (ref 8–23)
CO2: 26 mmol/L (ref 22–32)
Calcium: 9.7 mg/dL (ref 8.9–10.3)
Chloride: 103 mmol/L (ref 98–111)
Creatinine, Ser: 1.01 mg/dL (ref 0.61–1.24)
GFR, Estimated: >60 mL/min (ref >60)
Glucose, Bld: 107 mg/dL — ABNORMAL HIGH (ref 70–99)
Potassium: 4.7 mmol/L (ref 3.5–5.1)
Sodium: 135 mmol/L (ref 135–145)
Total Bilirubin: 0.2 mg/dL — ABNORMAL LOW (ref 0.3–1.2)
Total Protein: 6.4 g/dL — ABNORMAL LOW (ref 6.5–8.1)

## 2021-09-20 LAB — LACTATE DEHYDROGENASE: LDH: 107 U/L (ref 98–192)

## 2021-09-23 ENCOUNTER — Encounter: Payer: Self-pay | Admitting: Oncology

## 2021-09-23 ENCOUNTER — Inpatient Hospital Stay: Payer: Medicare Other | Attending: Oncology | Admitting: Oncology

## 2021-09-23 VITALS — BP 99/37 | HR 61 | Temp 98.7°F | Resp 19 | Wt 171.1 lb

## 2021-09-23 DIAGNOSIS — C8519 Unspecified B-cell lymphoma, extranodal and solid organ sites: Secondary | ICD-10-CM | POA: Insufficient documentation

## 2021-09-23 DIAGNOSIS — C8590 Non-Hodgkin lymphoma, unspecified, unspecified site: Secondary | ICD-10-CM | POA: Diagnosis not present

## 2021-09-23 DIAGNOSIS — Z7189 Other specified counseling: Secondary | ICD-10-CM

## 2021-09-23 DIAGNOSIS — R634 Abnormal weight loss: Secondary | ICD-10-CM | POA: Insufficient documentation

## 2021-09-23 DIAGNOSIS — Z8546 Personal history of malignant neoplasm of prostate: Secondary | ICD-10-CM | POA: Insufficient documentation

## 2021-09-23 NOTE — Progress Notes (Signed)
?Hematology/Oncology Progress note ?[phone] [phone] ?  ? ? ? ?Patient Care Team: ?[name], MD as PCP - General ? ?REFERRING PROVIDER: ?[name], MD  ?CHIEF COMPLAINTS/REASON FOR VISIT:  ?Follow-up for low-grade lymphoma/CLL ? ?HISTORY OF PRESENTING ILLNESS:  ?05/21/2021 CBC showed elevated white count of 20.1, predominantly lymphocytosis and neutrophilia ?Previous lab records reviewed. Leukocytosis onset of chronic, duration is since 2019. ?05/21/2021 peripheral blood flowcytometry showed 1) CD5 mostly negative to dim+, CD10-, CD23-, FMC-7+ B cell  ?lymphoproliferative disorder detected, nonspecific phenotype, 2) CD5+, CD23+ clonal B cell population detected, CLL/SLL phenotype, CD38 indeterminate for prognosis (29%), 18% of leukocytes, <5,000 cells/uL. ? ? ?Denies fever, chills,night sweats. + Fatigue + weight loss 9 pounds since Feb 2022. Significant other cooks healthy food.   ?Denies History of recent oral steroid use or steroid injection. ?Denies recent infection:  ?Denies Autoimmune disease history.   ? 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  ?History of prostate cancer in 2017, he received radiation.  ? ?10/27/2019 CT angio chest abdomen pelvis showed Retroperitoneal adenopathy ?06/07/2021, peripheral blood flow cytometry showed ?1)  CD5+ B-cell lymphoproliferative disorder  -not typical for CLL/SLL or mantle cell lymphoma. ?2) CD5+, CD23+ clonal B-cell population, CLL/SLL phenotype, CD38 +, 21% of leukocytes <5000/uL ? ?07/05/2021, bone marrow biopsy showed hypercellular bone marrow with involvement of a low-grade B-cell lymphoproliferative disorder.  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   ?Flow cytometry analysis showed major B-cell population representing 56% of all cells expressing and B-cell antigens including CD20.  Half of the B-cell population showed CD5 and CD200 expression associated  with extremely thin/negative staining for surface immunoglobulin light chains.  The other half lacks CD5 expression but displays CD200 expression and lambda light chain restrictions.  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 ?07/08/2021, left infraclavicular lymph node biopsy showed low-grade B-cell lymphoma.  Ki-67 stain is variable in the interfollicular areas, 18% to 30%.  ?Flow cytometry was performed on nodal tissue and reported separately by [employer], accession [id number]. In summary, there are two separate B cell clones, in roughly equal proportions:  ?1) Kappa light chain restricted (dim expression), CD20+ (dim), CD5+, CD23+, CD43+, FMC-  ?2) Lambda light chain restricted, CD20+, CD5-, CD23+, CD43-, FMC+(dim). The first clone is present in the blood, and is new for this patient in 2022. The second clone was first detected in the blood in 2017, when the patient had fewer than 5,000 lymphocytes per microL, and is persistent in the blood. The bone marrow is also positive for both clones in equal proportions.  ? Differential is between biclonal CLL/SLL, biclonal marginal zone lymphoma, Lymphoma, or composite lymphoma with both diagnosis. ? ?07/01/2021, PET scan showed hypermetabolic superior mediastinal, axillary and subpectoral lymph nodes bilaterally, retroperitoneal and mesenteric lymph nodes.  No suspicious hypermetabolic activity within the bone, lungs: Parenchymal organs.  Probable healing right-sided rib fractures.  Coronary and aortic atherosclerosis. ? ? ?INTERVAL HISTORY ?Wesley [name] is a 81 y.o. male who has above history reviewed by me today presents for follow up visit for lymphadenopathy. ? ?Patient reports feeling well today.  He has good appetite.  No night sweats fever.  He has lost about 4 to 5 pounds since December 2022.  He was accompanied by his significant other ? ? Review of Systems   ?Constitutional:  Positive for fatigue. Negative for appetite change, chills, fever and unexpected weight change.  ?  HENT:   Negative for hearing loss and voice change.   ?Eyes:  Negative for eye problems and icterus.  ?Respiratory:  Negative for chest tightness, cough and shortness of breath.   ?Cardiovascular:  Negative for chest pain and leg swelling.  ?Gastrointestinal:  Negative for abdominal distention and abdominal pain.  ?Endocrine: Negative for hot flashes.  ?Genitourinary:  Negative for difficulty urinating, dysuria and frequency.   ?Musculoskeletal:  Negative for arthralgias.  ?Skin:  Negative for itching and rash.  ?Neurological:  Negative for light-headedness and numbness.  ?Hematological:  Negative for adenopathy. Does not bruise/bleed easily.  ?Psychiatric/Behavioral:  Negative for confusion.   ? ? ?MEDICAL HISTORY:  ?Past Medical History:  ?Diagnosis Date  ? Allergic rhinitis   ? CAD (coronary artery disease)   ? Cancer of jaw [facility]) [phone]  ? CHF (congestive heart failure) ([place])   ? LVEF 35% BY echo 02-2019  ? Chronic back pain   ? no surgery  ? Coronary atherosclerosis of autologous vein bypass graft   ? Dysrhythmia   ? irregular but not specifically diagnosed  ? Essential hypertension, benign   ? pt denies 04/07/14  ? GERD (gastroesophageal reflux disease)   ? Hyperlipidemia   ? Intermittent vertigo   ? Labral tear of shoulder   ? w/ fracture  ? Leukocytosis   ? Macular degeneration   ? 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 ultrasound, TSH 1.324 07-16-18  ? OSA (obstructive sleep apnea)   ? NOP CPAP. sleep study 06/03/2012 - AHI 54.74/hr during total sleep, AHI 49.66/hr during REM  ? Prostate cancer ([place]) 10/2015  ? radiation  followed by Dr. Karsten Ro  ? S/P CABG x 4 2005  ? Stroke [facility]) 2011  ? damage to back of brain, difficulty when turning with ambulation  ? Synovitis and tenosynovitis, unspecified   ? Thoracic aortic aneurysm without rupture ([place]) 10/2019  ? 4.4 cm by CA 2021  ? Tubular adenoma of  colon   ? Unspecified vitamin D deficiency   ? ? ?SURGICAL HISTORY: ?Past Surgical History:  ?Procedure Laterality Date  ? BIOPSY THYROID    ? HX NODULES  ? BLADDER INSTILLATION N/A 09/13/2021  ? Procedure: BLADDER INSTILLATION OF GEMCITABINE;  Surgeon: Billey Co, MD;  Location: [facility] ORS;  Service: Urology;  Laterality: N/A;  ? CARDIAC CATHETERIZATION  02/01/2004  ? L main with 60% prox stenosis, LAD calcified in prox section with 95% ostial lesion, diffusion 50-60% disease in mid LAD with focal 90% mid LAD stenosis; 1st diagonal coarsely irregular with up to 30% stenosis in prox & mid segments; L Cfx with 3 OMs, AV groove Cfx with 40-50% stenosis; 3rd OM with 50% prox stenosis; RCA with PDA & PLA, PLA with 95% osital lesion (Dr. Gerrie Nordmann)  ? CARPAL TUNNEL RELEASE    ? CATARACT EXTRACTION, BILATERAL    ? COLONOSCOPY    ? CORONARY ARTERY BYPASS GRAFT  02/02/2004  ? LIMA to DX1, RIMA to RCA, SVG to distal LAD, SVG to Cfx marginal (Dr. Remus Loffler)  ? CYSTOSCOPY W/ RETROGRADES  09/13/2021  ? Procedure: CYSTOSCOPY WITH RETROGRADE PYELOGRAM;  Surgeon: Billey Co, MD;  Location: [facility] ORS;  Service: Urology;;  ? EYE SURGERY Bilateral   ? cataract extractions  ? gold seed marker placement/prostate  02/28/2016  ? HEMORRHOID SURGERY  2002  ? JOINT REPLACEMENT Left 2014  ? knee  ? JOINT REPLACEMENT Right 05/17/2018  ? knee  ? KNEE ARTHROPLASTY Right 05/17/2018  ? Procedure: COMPUTER ASSISTED  TOTAL KNEE ARTHROPLASTY;  Surgeon: Dereck Leep, MD;  Location: [facility] ORS;  Service: Orthopedics;  Laterality: Right;  ? KNEE ARTHROSCOPY  01/05/2019  ? Procedure: ARTHROSCOPY KNEE, LYSIS OF ADHESIONS;  Surgeon: Dereck Leep, MD;  Location: [facility] ORS;  Service: Orthopedics;;  ? MASS EXCISION Left 07/08/2021  ? Procedure: EXCISION MASS;  Surgeon: Herbert Pun, MD;  Location: [facility] ORS;  Service: General;  Laterality: Left;  ? NM MYOCAR PERF WALL MOTION  06/2012  ? lexiscan myoview - normal low risk study, EF 47%  ?  PAROTIDECTOMY Left 08/04/2017  ? Procedure: SUPERFICIAL PAROTIDECTOMY;  Surgeon: Beverly Gust, MD;  Location: [facility] ORS;  Service: ENT;  Laterality: Left;  ? SOFT TISSUE TUMOR RESECTION  1972  ? of right ja

## 2021-09-25 ENCOUNTER — Encounter: Payer: Self-pay | Admitting: Urology

## 2021-09-25 ENCOUNTER — Ambulatory Visit: Payer: Medicare Other | Admitting: Urology

## 2021-09-25 VITALS — BP 87/53 | HR 76 | Ht 66.0 in | Wt 172.0 lb

## 2021-09-25 DIAGNOSIS — C672 Malignant neoplasm of lateral wall of bladder: Secondary | ICD-10-CM

## 2021-09-25 NOTE — Progress Notes (Signed)
? ?  09/25/2021 ?1:28 PM  ? ?Wesley [name] ?08/04/1940 ?[id number] ? ?Reason for visit: Discuss bladder tumor pathology ? ?HPI: ?81 year old male with low-grade lymphoma followed by oncology who presented with gross hematuria and was found to have a small papillary bladder tumor.  He underwent an uncomplicated bladder biopsy and fulguration of a 2 cm lateral wall tumor on 09/13/2021, with postop instillation of gemcitabine.  He has been doing well since that time and is urinating with no further hematuria, and dysuria resolved after 36 hours. ? 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  We reviewed the AUA guidelines that recommend no additional treatments aside from surveillance, with plan for clinic cystoscopy in 3 months, then on a yearly basis for 5 years to monitor for recurrence.  We discussed the very low risk of developing metastatic disease with low-grade disease, and reassurance provided. ? ?RTC 3 months cystoscopy for LG Ta urothelial cell carcinoma ? ? ?Billey Co, MD ? ?[facility] ?[address], [address] ?[place], [place] [address] ?[phone] ? ? ?

## 2021-09-26 ENCOUNTER — Ambulatory Visit: Payer: Medicare Other | Admitting: Urology

## 2021-12-11 ENCOUNTER — Encounter: Payer: Self-pay | Admitting: Oncology

## 2021-12-17 ENCOUNTER — Telehealth: Payer: Self-pay

## 2021-12-27 ENCOUNTER — Other Ambulatory Visit: Payer: Medicare Other

## 2021-12-30 ENCOUNTER — Ambulatory Visit: Payer: Medicare Other | Admitting: Oncology

## 2021-12-30 ENCOUNTER — Other Ambulatory Visit: Payer: Medicare Other

## 2021-12-31 ENCOUNTER — Other Ambulatory Visit: Payer: Medicare Other | Admitting: Urology

## 2022-01-02 ENCOUNTER — Ambulatory Visit: Payer: Medicare Other | Admitting: Oncology

## 2022-01-07 ENCOUNTER — Encounter: Payer: Self-pay | Admitting: Dermatology

## 2022-01-07 ENCOUNTER — Encounter: Payer: Self-pay | Admitting: Urology

## 2022-01-07 ENCOUNTER — Ambulatory Visit: Payer: Medicare Other | Admitting: Dermatology

## 2022-01-07 ENCOUNTER — Ambulatory Visit: Payer: Medicare Other | Admitting: Urology

## 2022-01-07 VITALS — BP 90/54 | HR 64 | Ht 66.0 in | Wt 160.0 lb

## 2022-01-07 DIAGNOSIS — C672 Malignant neoplasm of lateral wall of bladder: Secondary | ICD-10-CM

## 2022-01-07 DIAGNOSIS — L570 Actinic keratosis: Secondary | ICD-10-CM

## 2022-01-07 DIAGNOSIS — R21 Rash and other nonspecific skin eruption: Secondary | ICD-10-CM

## 2022-01-07 DIAGNOSIS — Z8551 Personal history of malignant neoplasm of bladder: Secondary | ICD-10-CM | POA: Diagnosis not present

## 2022-01-07 MED ORDER — CEPHALEXIN 250 MG PO CAPS
500.0000 mg | ORAL_CAPSULE | Freq: Once | ORAL | Status: AC
  Administered 2022-01-07: 500 mg via ORAL

## 2022-01-07 MED ORDER — CLOBETASOL PROPIONATE 0.05 % EX OINT: TOPICAL_OINTMENT | CUTANEOUS | 2 refills | Status: DC

## 2022-01-07 NOTE — Patient Instructions (Addendum)
Cryotherapy Aftercare  Wash gently with soap and water everyday.   Apply Vaseline daily until healed.    Recommend using Deet or Picaridin insect repellent.  Start Clobetasol ointment twice daily to affected areas as needed for itching. Avoid applying to face, groin, and axilla. Use as directed. Long-term use can cause thinning of the skin.  Recommend OTC Gold Bond Rapid Relief Anti-Itch cream (pramoxine + menthol), CeraVe Anti-itch cream or lotion (pramoxine), Sarna lotion (Original- menthol + camphor or Sensitive- pramoxine) or Eucerin 12 hour Itch Relief lotion (menthol) up to 3 times per day to areas on body that are itchy.  Topical steroids (such as triamcinolone, fluocinolone, fluocinonide, mometasone, clobetasol, halobetasol, betamethasone, hydrocortisone) can cause thinning and lightening of the skin if they are used for too long in the same area. Your physician has selected the right strength medicine for your problem and area affected on the body. Please use your medication only as directed by your physician to prevent side effects.    Recommend daily broad spectrum sunscreen SPF 30+ to sun-exposed areas, reapply every 2 hours as needed. Call for new or changing lesions.  Staying in the shade or wearing long sleeves, sun glasses (UVA+UVB protection) and wide brim hats (4-inch brim around the entire circumference of the hat) are also recommended for sun protection.   Due to recent changes in healthcare laws, you may see results of your pathology and/or laboratory studies on MyChart before the doctors have had a chance to review them. We understand that in some cases there may be results that are confusing or concerning to you. Please understand that not all results are received at the same time and often the doctors may need to interpret multiple results in order to provide you with the best plan of care or course of treatment. Therefore, we ask that you please give [place] 2 business days to  thoroughly review all your results before contacting the office for clarification. Should we see a critical lab result, you will be contacted sooner.   If You Need Anything After Your Visit  If you have any questions or concerns for your doctor, please call our main line at [phone] and press option 4 to reach your doctor's medical assistant. If no one answers, please leave a voicemail as directed and we will return your call as soon as possible. Messages left after 4 pm will be answered the following business day.   You may also send [place] a message via [place]. We typically respond to MyChart messages within 1-2 business days.  For prescription refills, please ask your pharmacy to contact our office. Our fax number is [phone].  If you have an urgent issue when the clinic is closed that cannot wait until the next business day, you can page your doctor at the number below.    Please note that while we do our best to be available for urgent issues outside of office hours, we are not available 24/7.   If you have an urgent issue and are unable to reach [place], you may choose to seek medical care at your doctor's office, retail clinic, urgent care center, or emergency room.  If you have a medical emergency, please immediately call 911 or go to the emergency department.  Pager Numbers  - Dr. Nehemiah Massed: [phone]  - Dr. Laurence Ferrari: [phone]  - Dr. Nicole Kindred: [phone]  In the event of inclement weather, please call our main line at [phone] for an update on the status of any delays  or closures.  Dermatology Medication Tips: Please keep the boxes that topical medications come in in order to help keep track of the instructions about where and how to use these. Pharmacies typically print the medication instructions only on the boxes and not directly on the medication tubes.   If your medication is too expensive, please contact our office at [phone] option 4 or send [place] a message  through [place].   We are unable to tell what your co-pay for medications will be in advance as this is different depending on your insurance coverage. However, we may be able to find a substitute medication at lower cost or fill out paperwork to get insurance to cover a needed medication.   If a prior authorization is required to get your medication covered by your insurance company, please allow [place] 1-2 business days to complete this process.  Drug prices often vary depending on where the prescription is filled and some pharmacies may offer cheaper prices.  The website www.goodrx.com contains coupons for medications through different pharmacies. The prices here do not account for what the cost may be with help from insurance (it may be cheaper with your insurance), but the website can give you the price if you did not use any insurance.  - You can print the associated coupon and take it with your prescription to the pharmacy.  - You may also stop by our office during regular business hours and pick up a GoodRx coupon card.  - If you need your prescription sent electronically to a different pharmacy, notify our office through [facility] or by phone at [phone] option 4.     Si Usted Necesita Algo Despus de Su Visita  Tambin puede enviarnos un mensaje a travs de Pharmacist, community. Por lo general respondemos a los mensajes de MyChart en el transcurso de 1 a 2 das hbiles.  Para renovar recetas, por favor pida a su farmacia que se ponga en contacto con nuestra oficina. Harland Dingwall de fax es [place] [phone].  Si tiene un asunto urgente cuando la clnica est cerrada y que no puede esperar hasta el siguiente da hbil, puede llamar/localizar a su doctor(a) al nmero que aparece a continuacin.   Por favor, tenga en cuenta que aunque hacemos todo lo posible para estar disponibles para asuntos urgentes fuera del horario de [place], no estamos disponibles las 24 horas del da, los 7 das de  la [place].   Si tiene un problema urgente y no puede comunicarse con nosotros, puede optar por buscar atencin mdica  en el consultorio de su doctor(a), en una clnica privada, en un centro de atencin urgente o en una sala de emergencias.  Si tiene Engineering geologist, por favor llame inmediatamente al 911 o vaya a la sala de emergencias.  Nmeros de bper  - Dr. Nehemiah Massed: [phone]  - Dra. Moye: [phone]  - Dra. Nicole Kindred: [phone]  En caso de inclemencias del [place], por favor llame a Johnsie Kindred principal al [phone] para una actualizacin sobre el [place] de cualquier retraso o cierre.  Consejos para la medicacin en dermatologa: Por favor, guarde las cajas en las que vienen los medicamentos de uso tpico para ayudarle a seguir las instrucciones sobre dnde y cmo usarlos. Las farmacias generalmente imprimen las instrucciones del medicamento slo en las cajas y no directamente en los tubos del [place].   Si su medicamento es muy caro, por favor, pngase en contacto con Zigmund Daniel llamando al [phone] y presione la opcin 4  o envenos un mensaje a travs de MyChart.   No podemos decirle cul ser su copago por los medicamentos por adelantado ya que esto es diferente dependiendo de la cobertura de su seguro. Sin embargo, es posible que podamos encontrar un medicamento sustituto a Electrical engineer un formulario para que el seguro cubra el medicamento que se considera necesario.   Si se requiere una autorizacin previa para que su compaa de seguros [place] su medicamento, por favor permtanos de 1 a 2 das hbiles para completar este proceso.  Los precios de los medicamentos varan con frecuencia dependiendo del Environmental consultant de dnde se surte la receta y alguna farmacias pueden ofrecer precios ms baratos.  El sitio web www.goodrx.com tiene cupones para medicamentos de Airline pilot. Los precios aqu no tienen en cuenta lo que podra costar con la ayuda  del seguro (puede ser ms barato con su seguro), pero el sitio web puede darle el precio si no utiliz Research scientist (physical sciences).  - Puede imprimir el cupn correspondiente y llevarlo con su receta a la farmacia.  - Tambin puede pasar por nuestra oficina durante el horario de atencin regular y Charity fundraiser una tarjeta de cupones de GoodRx.  - Si necesita que su receta se enve electrnicamente a una farmacia diferente, informe a nuestra oficina a travs de MyChart de [place] o por telfono llamando al [phone] y presione la opcin 4.

## 2022-01-07 NOTE — Progress Notes (Signed)
New Patient Visit  Subjective  Wesley [name] is a 81 y.o. male who presents for the following: lesions (Face. Pink, scaly. Patient has "long history of cancer". Concerned about these areas) and Rash (Arms. Bug bites or poison ivy. Spreading, red, itching. Has poison ivy on legs but has resolved).  The patient has spots, moles and lesions to be evaluated, some may be new or changing and the patient has concerns that these could be cancer.   Review of Systems: No other skin or systemic complaints except as noted in HPI or Assessment and Plan.   Objective  Well appearing patient in no apparent distress; mood and affect are within normal limits.  A focused examination was performed including face, arms, hands. Relevant physical exam findings are noted in the Assessment and Plan.  left forehead x1, right forehead x1 (2) Erythematous thin papules/macules with gritty scale.   arms Scattered erythematous papules   Assessment & Plan  AK (actinic keratosis) (2) left forehead x1, right forehead x1  Actinic keratoses are precancerous spots that appear secondary to cumulative UV radiation exposure/sun exposure over time. They are chronic with expected duration over 1 year. A portion of actinic keratoses will progress to squamous cell carcinoma of the skin. It is not possible to reliably predict which spots will progress to skin cancer and so treatment is recommended to prevent development of skin cancer.  Recommend daily broad spectrum sunscreen SPF 30+ to sun-exposed areas, reapply every 2 hours as needed.  Recommend staying in the shade or wearing long sleeves, sun glasses (UVA+UVB protection) and wide brim hats (4-inch brim around the entire circumference of the hat). Call for new or changing lesions.  Destruction of lesion - left forehead x1, right forehead x1  Destruction method: cryotherapy   Informed consent: discussed and consent obtained   Lesion destroyed using liquid  nitrogen: Yes   Outcome: patient tolerated procedure well with no complications   Post-procedure details: wound care instructions given   Additional details:  Prior to procedure, discussed risks of blister formation, small wound, skin dyspigmentation, or rare scar following cryotherapy. Recommend Vaseline ointment to treated areas while healing.   Rash arms  Ddx Insect bites > contact dermatitis   Recommend using Deet or Picaridin insect repellent whenever outdoors.  Start Clobetasol ointment twice daily to affected areas as needed for itching. Avoid applying to face, groin, and axilla. Use as directed. Long-term use can cause thinning of the skin.  Recommend OTC Gold Bond Rapid Relief Anti-Itch cream (pramoxine + menthol), CeraVe Anti-itch cream or lotion (pramoxine), Sarna lotion (Original- menthol + camphor or Sensitive- pramoxine) or Eucerin 12 hour Itch Relief lotion (menthol) up to 3 times per day to areas on body that are itchy.  Topical steroids (such as triamcinolone, fluocinolone, fluocinonide, mometasone, clobetasol, halobetasol, betamethasone, hydrocortisone) can cause thinning and lightening of the skin if they are used for too long in the same area. Your physician has selected the right strength medicine for your problem and area affected on the body. Please use your medication only as directed by your physician to prevent side effects.    clobetasol ointment (TEMOVATE) 0.05 % - arms Apply twice daily to affected areas on body as needed for rash/itching. Avoid applying to face, groin, and axilla.   Return in about 3 months (around 04/09/2022) for AK Follow Up, TBSE.  I, Emelia Salisbury, CMA, am acting as scribe for Forest Gleason, MD.  Documentation: I have reviewed the above documentation for accuracy  and completeness, and I agree with the above.  Forest Gleason, MD

## 2022-01-07 NOTE — Progress Notes (Signed)
Cystoscopy Procedure Note:  Indication: History of low-grade noninvasive bladder cancer  81 year old male who presented with gross hematuria and was found to have a small bladder tumor.  Underwent uncomplicated bladder biopsy and fulguration of 2 cm lateral wall tumor on 09/13/2021 with postop gemcitabine.  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  Keflex given for prophylaxis  After informed consent and discussion of the procedure and its risks, Wesley [name] was positioned and prepped in the standard fashion. Cystoscopy was performed with a flexible cystoscope. The urethra, bladder neck and entire bladder was visualized in a standard fashion. The prostate was moderate in size. The ureteral orifices were visualized in their normal location and orientation.  Bladder mucosa grossly normal throughout, no suspicious lesions, no abnormalities on retroflexion.  Findings: No evidence of recurrence  Assessment and Plan: RTC 9 months cystoscopy  Nickolas Madrid, MD 01/07/2022

## 2022-01-10 ENCOUNTER — Inpatient Hospital Stay: Payer: Medicare Other | Attending: Oncology

## 2022-01-10 DIAGNOSIS — C8519 Unspecified B-cell lymphoma, extranodal and solid organ sites: Secondary | ICD-10-CM | POA: Diagnosis present

## 2022-01-10 DIAGNOSIS — C8590 Non-Hodgkin lymphoma, unspecified, unspecified site: Secondary | ICD-10-CM

## 2022-01-10 LAB — COMPREHENSIVE METABOLIC PANEL
ALT: 22 U/L (ref 0–44)
AST: 32 U/L (ref 15–41)
Albumin: 3.6 g/dL (ref 3.5–5.0)
Alkaline Phosphatase: 68 U/L (ref 38–126)
Anion gap: 5 (ref 5–15)
BUN: 28 mg/dL — ABNORMAL HIGH (ref 8–23)
CO2: 26 mmol/L (ref 22–32)
Calcium: 9.4 mg/dL (ref 8.9–10.3)
Chloride: 103 mmol/L (ref 98–111)
Creatinine, Ser: 1.05 mg/dL (ref 0.61–1.24)
GFR, Estimated: >60 mL/min (ref >60)
Glucose, Bld: 143 mg/dL — ABNORMAL HIGH (ref 70–99)
Potassium: 4.6 mmol/L (ref 3.5–5.1)
Sodium: 134 mmol/L — ABNORMAL LOW (ref 135–145)
Total Bilirubin: 0.6 mg/dL (ref 0.3–1.2)
Total Protein: 6.2 g/dL — ABNORMAL LOW (ref 6.5–8.1)

## 2022-01-10 LAB — CBC WITH DIFFERENTIAL/PLATELET
Abs Immature Granulocytes: 0.15 10*3/uL — ABNORMAL HIGH (ref 0.00–0.07)
Basophils Absolute: 0.1 10*3/uL (ref 0.0–0.1)
Basophils Relative: 0 %
Eosinophils Absolute: 0.7 10*3/uL — ABNORMAL HIGH (ref 0.0–0.5)
Eosinophils Relative: 2 %
HCT: 37 % — ABNORMAL LOW (ref 39.0–52.0)
Hemoglobin: 11.6 g/dL — ABNORMAL LOW (ref 13.0–17.0)
Immature Granulocytes: 1 %
Lymphocytes Relative: 71 %
Lymphs Abs: 23 10*3/uL — ABNORMAL HIGH (ref 0.7–4.0)
MCH: 28.9 pg (ref 26.0–34.0)
MCHC: 31.4 g/dL (ref 30.0–36.0)
MCV: 92.3 fL (ref 80.0–100.0)
Monocytes Absolute: 3.1 10*3/uL — ABNORMAL HIGH (ref 0.1–1.0)
Monocytes Relative: 10 %
Neutro Abs: 5.2 10*3/uL (ref 1.7–7.7)
Neutrophils Relative %: 16 %
Platelets: 204 10*3/uL (ref 150–400)
RBC: 4.01 MIL/uL — ABNORMAL LOW (ref 4.22–5.81)
RDW: 14 % (ref 11.5–15.5)
Smear Review: NORMAL
WBC: 32.3 10*3/uL — ABNORMAL HIGH (ref 4.0–10.5)
nRBC: 0 % (ref 0.0–0.2)

## 2022-01-10 LAB — LACTATE DEHYDROGENASE: LDH: 123 U/L (ref 98–192)

## 2022-01-12 ENCOUNTER — Encounter: Payer: Self-pay | Admitting: Dermatology

## 2022-01-14 ENCOUNTER — Ambulatory Visit: Payer: Medicare Other | Admitting: Oncology

## 2022-01-23 ENCOUNTER — Inpatient Hospital Stay: Payer: Medicare Other | Admitting: Oncology

## 2022-01-23 ENCOUNTER — Encounter: Payer: Self-pay | Admitting: Oncology

## 2022-01-23 ENCOUNTER — Inpatient Hospital Stay: Payer: Medicare Other | Attending: Oncology | Admitting: Oncology

## 2022-01-23 VITALS — BP 89/55 | HR 61 | Temp 97.8°F | Resp 16 | Wt 170.2 lb

## 2022-01-23 DIAGNOSIS — C9110 Chronic lymphocytic leukemia of B-cell type not having achieved remission: Secondary | ICD-10-CM | POA: Diagnosis not present

## 2022-01-23 DIAGNOSIS — C8519 Unspecified B-cell lymphoma, extranodal and solid organ sites: Secondary | ICD-10-CM | POA: Diagnosis present

## 2022-01-23 DIAGNOSIS — C8510 Unspecified B-cell lymphoma, unspecified site: Secondary | ICD-10-CM

## 2022-01-23 NOTE — Progress Notes (Signed)
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  In November 2022 he was noted to have a white cell count of 20.1 with predominant lymphocytosis and neutrophilia.  2. 05/21/2021 peripheral blood flowcytometry showed 1) CD5 mostly negative to dim+, CD10-, CD23-, FMC-7+ B cell  lymphoproliferative disorder detected, nonspecific phenotype, 2) CD5+, CD23+ clonal B cell population detected, CLL/SLL phenotype, CD38 indeterminate for prognosis (29%), 18% of leukocytes, <5,000 cells/uL.  3. 07/05/2021, bone marrow biopsy showed hypercellular bone marrow with involvement of a low-grade B-cell lymphoproliferative disorder.  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   Flow cytometry analysis showed major B-cell population representing 56% of all cells expressing and B-cell antigens including CD20.  Half of the B-cell population showed CD5 and CD200 expression associated with extremely thin/negative staining for surface immunoglobulin light chains.  The other half lacks CD5 expression but displays CD200 expression and lambda light chain restrictions.  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  Ki-67 stain is variable in the interfollicular areas, 09% to 30%.  Flow cytometry was  performed on nodal tissue and reported separately by [employer], accession [id number]. In summary, there are two separate B cell clones, in roughly equal proportions:  1) Kappa light chain restricted (dim expression), CD20+ (dim), CD5+, CD23+, CD43+, FMC-  2) Lambda light chain restricted, CD20+, CD5-, CD23+, CD43-, FMC+(dim). The first clone is present in the blood, and is new for this patient in 2022. The second clone was first detected in the blood in 2017, when the patient had fewer than 5,000 lymphocytes per microL, and is persistent in the blood. The bone marrow is also positive for both clones in equal proportions.   Differential is between biclonal CLL/SLL, biclonal marginal zone lymphoma, Lymphoma, or composite lymphoma with both diagnosis  4. PET CT scan in January 2023 showed thoracic and abdominal lymphadenopathy that was moderately hypermetabolic with an SUV ranging between 3-5.  Spleen appeared normal.  Interval history-patient is doing well for his age at 80.  He is active and works a full-time job and is also active on weekends.  Appetite and weight have remained stable.  Denies any significant fatigue or drenching night sweats.  He has been getting more frequent bug bites.  No recent falls  ECOG PS- 1 Pain scale- 0   Review of systems- Review of Systems  Constitutional:  Negative for chills, fever, malaise/fatigue and weight loss.  HENT:  Negative for congestion, ear discharge and nosebleeds.   Eyes:  Negative for blurred vision.  Respiratory:  Negative for cough, hemoptysis, sputum production, shortness of breath and wheezing.   Cardiovascular:  Negative for chest pain, palpitations, orthopnea and claudication.  Gastrointestinal:  Negative for abdominal pain, blood in stool, constipation, diarrhea, heartburn, melena, nausea and vomiting.  Genitourinary:  Negative for dysuria, flank pain, frequency, hematuria and urgency.  Musculoskeletal:  Negative for back pain, joint pain  and myalgias.  Skin:  Negative for rash.  Neurological:  Negative for dizziness, tingling, focal weakness, seizures, weakness and headaches.  Endo/Heme/Allergies:  Does not bruise/bleed easily.  Psychiatric/Behavioral:  Negative for depression and suicidal ideas. The patient does not have insomnia.       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 sleep study 06/03/2012 - AHI 54.74/hr during total sleep, AHI 49.66/hr during REM   Prostate cancer ([place]) 10/2015   radiation  followed by Dr. Karsten Ro   S/P CABG x 4 2005   Stroke [facility]) 2011   damage to back of brain, difficulty when turning with ambulation   Synovitis and tenosynovitis, unspecified    Thoracic aortic aneurysm without rupture ([place]) 10/2019   4.4 cm by CA 2021   Tubular adenoma of colon    Unspecified vitamin D deficiency      Past Surgical History:  Procedure Laterality Date   BIOPSY THYROID     HX NODULES   BLADDER INSTILLATION N/A 09/13/2021   Procedure: BLADDER INSTILLATION OF GEMCITABINE;  Surgeon: Billey Co, MD;  Location: [facility] ORS;  Service: Urology;  Laterality: N/A;   CARDIAC CATHETERIZATION  02/01/2004   L main with 60% prox stenosis, LAD calcified in prox section with 95% ostial lesion, diffusion 50-60% disease in mid LAD with  focal 90% mid LAD stenosis; 1st diagonal coarsely irregular with up to 30% stenosis in prox & mid segments; L Cfx with 3 OMs, AV groove Cfx with 40-50% stenosis; 3rd OM with 50% prox stenosis; RCA with PDA & PLA, PLA with 95% osital lesion (Dr. Gerrie Nordmann)   CARPAL TUNNEL RELEASE     CATARACT EXTRACTION, BILATERAL     COLONOSCOPY     CORONARY ARTERY BYPASS GRAFT  02/02/2004   LIMA to DX1, RIMA to RCA, SVG to distal LAD, SVG to Cfx marginal (Dr. Remus Loffler)   CYSTOSCOPY W/ RETROGRADES  09/13/2021   Procedure: CYSTOSCOPY WITH RETROGRADE PYELOGRAM;  Surgeon: Billey Co, MD;  Location: [facility] ORS;  Service: Urology;;   EYE SURGERY Bilateral    cataract extractions   gold seed marker placement/prostate  02/28/2016   HEMORRHOID SURGERY  2002   JOINT REPLACEMENT Left 2014   knee   JOINT REPLACEMENT Right 05/17/2018   knee   KNEE ARTHROPLASTY Right 05/17/2018   Procedure: COMPUTER ASSISTED TOTAL KNEE ARTHROPLASTY;  Surgeon: Dereck Leep, MD;  Location: [facility] ORS;  Service: Orthopedics;  Laterality: Right;   KNEE ARTHROSCOPY  01/05/2019   Procedure: ARTHROSCOPY KNEE, LYSIS OF ADHESIONS;  Surgeon: Dereck Leep, MD;  Location: [facility] OR[facility];  Service: Orthopedics;;   MASS EXCISION Left 07/08/2021   Procedure: EXCISION MASS;  Surgeon:  Herbert Pun, MD;  Location: [facility] OR[facility];  Service: General;  Laterality: Left;   NM MYOCAR PERF WALL MOTION  06/2012   lexiscan myoview - normal low risk study, EF 47%   PAROTIDECTOMY Left 08/04/2017   Procedure: SUPERFICIAL PAROTIDECTOMY;  Surgeon: Beverly Gust, MD;  Location: [facility] OR[facility];  Service: ENT;  Laterality: Left;   SOFT TISSUE TUMOR RESECTION  1972   of right jaw x3, pathology unknown, possible sarcoma   TONSILLECTOMY AND ADENOIDECTOMY     TOTAL KNEE ARTHROPLASTY Left    Dr. Marry Guan [facility] [place])   TRANSTHORACIC ECHOCARDIOGRAM  06/2012   EF 50-55%, mild MR; LA mild-mod dilated; RVSP increased   TRANSURETHRAL RESECTION OF BLADDER TUMOR N/A  09/13/2021   Procedure: TRANSURETHRAL RESECTION OF BLADDER TUMOR (TURBT);  Surgeon: Billey Co, MD;  Location: [facility] OR[facility];  Service: Urology;  Laterality: N/A;   UPPER GASTROINTESTINAL ENDOSCOPY  05/03/2020    Social History   Socioeconomic History   Marital status: Significant Other    Spouse name: Blenda Mounts   Number of children: 3   Years of education: Not on file   Highest education level: Not on file  Occupational History   Occupation: attorney    Comment: still work  Tobacco Use   Smoking status: Never    Passive exposure: Never   Smokeless tobacco: Never  Vaping Use   Vaping Use: Never used  Substance and Sexual Activity   Alcohol use: Yes    Comment: occasional beer every 3-4 week   Drug use: Never   Sexual activity: Not Currently  Other Topics Concern   Not on file  Social History Narrative   Lives alone.   Social Determinants of Health   Financial Resource Strain: Not on file  Food Insecurity: Not on file  Transportation Needs: Not on file  Physical Activity: Not on file  Stress: Not on file  Social Connections: Not on file  Intimate Partner Violence: Not on file    Family History  Problem Relation Age of Onset   Heart disease Mother    Parkinsonism Father    Alzheimer's disease Father    Diabetes Paternal Grandfather    Stomach cancer Cousin    Colon cancer Neg Hx    Esophageal cancer Neg Hx    Rectal cancer Neg Hx    Liver cancer Neg Hx    Colon polyps Neg Hx      Current Outpatient Medications:    acetaminophen (TYLENOL) 325 MG tablet, Take 650 mg by mouth every 6 (six) hours as needed for mild pain or moderate pain., Disp: , Rfl:    Ascorbic Acid (VITAMIN C) 1000 MG tablet, Take 1,000 mg by mouth daily. , Disp: , Rfl:    aspirin EC 81 MG tablet, Take 81 mg by mouth daily with lunch., Disp: , Rfl:    atorvastatin (LIPITOR) 80 MG tablet, Take 80 mg by mouth at bedtime., Disp: , Rfl:    B Complex Vitamins (B COMPLEX 1 PO), Take 1  tablet by mouth daily., Disp: , Rfl:    carvedilol (COREG) 6.25 MG tablet, Take 6.25 mg by mouth 2 (two) times daily with a meal. , Disp: , Rfl:    clobetasol ointment (TEMOVATE) 0.05 %, Apply twice daily to affected areas on body as needed for rash/itching. Avoid applying to face, groin, and axilla., Disp: 60 g, Rfl: 2   CRANBERRY PO, Take 84 mg by mouth daily., Disp: , Rfl:    Lactobacillus (PROBIOTIC ACIDOPHILUS  PO), Take 1 tablet by mouth daily., Disp: , Rfl:    Lysine 1000 MG TABS, Take 1,000 mg by mouth daily., Disp: , Rfl:    magnesium oxide (MAG-OX) 400 MG tablet, Take 400 mg by mouth daily., Disp: , Rfl:    midodrine (PROAMATINE) 5 MG tablet, Take 5 mg by mouth 3 (three) times daily., Disp: , Rfl:    Multiple Vitamins-Minerals (ICAPS AREDS 2 PO), Take 1 capsule by mouth daily. , Disp: , Rfl:    Multiple Vitamins-Minerals (MULTIVITAMIN WITH MINERALS) tablet, Take 1 tablet by mouth daily. 50 +, Disp: , Rfl:    pramipexole (MIRAPEX) 0.5 MG tablet, Take 0.5 mg by mouth at bedtime., Disp: , Rfl:    sacubitril-valsartan (ENTRESTO) 24-26 MG, Take 1 tablet by mouth every 12 (twelve) hours., Disp: , Rfl:    timolol (TIMOPTIC) 0.5 % ophthalmic solution, Place 1 drop into both eyes at bedtime., Disp: , Rfl:    traMADol (ULTRAM) 50 MG tablet, Take 50 mg by mouth every 12 (twelve) hours. May take additional 50 mg if in extreme pain, Disp: , Rfl:    vitamin B-12 (CYANOCOBALAMIN) 1000 MCG tablet, Take 1,000 mcg by mouth daily., Disp: , Rfl:    zinc gluconate 50 MG tablet, Take 50 mg by mouth daily., Disp: , Rfl:    DOCUSATE SODIUM PO, Take 480 mg by mouth at bedtime as needed for constipation. (Patient not taking: Reported on 01/23/2022), Disp: , Rfl:    furosemide (LASIX) 20 MG tablet, Take 20 mg by mouth daily as needed for edema (Weight gain greater than 2 lbs). (Patient not taking: Reported on 01/23/2022), Disp: , Rfl:    GLUCOSAMINE SULFATE PO, Take 1 tablet by mouth daily. (Patient not taking: Reported  on 01/23/2022), Disp: , Rfl:    tamsulosin (FLOMAX) 0.4 MG CAPS capsule, Take 0.4 mg by mouth at bedtime. (Patient not taking: Reported on 01/23/2022), Disp: , Rfl:    tetrahydrozoline 0.05 % ophthalmic solution, Place 1 drop into both eyes daily as needed (for dry eyes). (Patient not taking: Reported on 01/23/2022), Disp: , Rfl:   Physical exam:  Vitals:   01/23/22 1509  BP: (!) 89/55  Pulse: 61  Resp: 16  Temp: 97.8 F (36.6 C)  SpO2: 96%  Weight: 170 lb 3.2 oz (77.2 kg)   Physical Exam Constitutional:      General: He is not in acute distress. Cardiovascular:     Rate and Rhythm: Normal rate and regular rhythm.     Heart sounds: Normal heart sounds.  Pulmonary:     Effort: Pulmonary effort is normal.     Breath sounds: Normal breath sounds.  Abdominal:     General: Bowel sounds are normal.     Palpations: Abdomen is soft.     Comments: No palpable hepatosplenomegaly  Lymphadenopathy:     Comments: There is mild palpable right supraclavicular adenopathy.  Bilateral axillary adenopathy right greater than left.  Skin:    General: Skin is warm and dry.  Neurological:     Mental Status: He is alert and oriented to person, place, and time.         Latest Ref Rng & Units 01/10/2022    2:02 PM  CMP  Glucose 70 - 99 mg/dL 143   BUN 8 - 23 mg/dL 28   Creatinine 0.61 - 1.24 mg/dL 1.05   Sodium 135 - 145 mmol/L 134   Potassium 3.5 - 5.1 mmol/L 4.6   Chloride 98 - 111 mmol/L  103   CO2 22 - 32 mmol/L 26   Calcium 8.9 - 10.3 mg/dL 9.4   Total Protein 6.5 - 8.1 g/dL 6.2   Total Bilirubin 0.3 - 1.2 mg/dL 0.6   Alkaline Phos 38 - 126 U/L 68   AST 15 - 41 U/L 32   ALT 0 - 44 U/L 22       Latest Ref Rng & Units 01/10/2022    2:02 PM  CBC  WBC 4.0 - 10.5 K/uL 32.3   Hemoglobin 13.0 - 17.0 g/dL 11.6   Hematocrit 39.0 - 52.0 % 37.0   Platelets 150 - 400 K/uL 204      Assessment and plan- Patient is a 81 y.o. male with Rai stage I CLL versus stage IV low-grade B-cell lymphoma  here for routine follow-up  Clinically patient is doing well with no concerning B symptoms.  His white cell count was elevated at 32 as compared to his baseline of 20.  Platelet count is normal and hemoglobin is at his baseline.I have reviewed his prior lymph node biopsies as well as bone marrow biopsies and PET scans and discussed everything in detail with patient and his significant other.  Bone marrow biopsy was suggestive more of a CLL and so was the supraclavicular lymph node biopsy however.  However an overlap of a low-grade B-cell lymphoma or composite lymphoma was also in the differential.  If we consider that all this is a CLL then he would be more likely a Rai stage I CLL given the absence of significant anemia or thrombocytopenia or splenomegaly.  He does have lymphocytosis and palpable adenopathy.  If we consider that this could be an overlap of a low-grade B-cell lymphoma this could be potentially classified as a stage IV B-cell lymphoma given the bone marrow involvement.  Regardless given the stability of his lymphadenopathy and absence of B symptoms and significant cytopenias this does not require treatment at this time.  I will consider treating him as a potential CLL in the future should he develop any indications for treatment.  I will see him back in 3 months with CBC with differential CMP and LDH for in person exam   Visit Diagnosis 1. CLL (chronic lymphocytic leukemia) ([place])   2. 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 Randa Evens, MD, MPH [facility] [phone] 01/23/2022 5:04 PM

## 2022-04-14 ENCOUNTER — Other Ambulatory Visit: Payer: Self-pay | Admitting: Internal Medicine

## 2022-04-14 DIAGNOSIS — R270 Ataxia, unspecified: Secondary | ICD-10-CM

## 2022-04-14 DIAGNOSIS — M5116 Intervertebral disc disorders with radiculopathy, lumbar region: Secondary | ICD-10-CM

## 2022-04-17 ENCOUNTER — Ambulatory Visit: Payer: Medicare Other | Admitting: Dermatology

## 2022-04-18 ENCOUNTER — Inpatient Hospital Stay: Payer: Medicare Other | Attending: Oncology

## 2022-04-18 DIAGNOSIS — D72820 Lymphocytosis (symptomatic): Secondary | ICD-10-CM | POA: Insufficient documentation

## 2022-04-18 DIAGNOSIS — C9110 Chronic lymphocytic leukemia of B-cell type not having achieved remission: Secondary | ICD-10-CM

## 2022-04-18 LAB — COMPREHENSIVE METABOLIC PANEL
ALT: 18 U/L (ref 0–44)
AST: 29 U/L (ref 15–41)
Albumin: 3.5 g/dL (ref 3.5–5.0)
Alkaline Phosphatase: 80 U/L (ref 38–126)
Anion gap: 5 (ref 5–15)
BUN: 27 mg/dL — ABNORMAL HIGH (ref 8–23)
CO2: 24 mmol/L (ref 22–32)
Calcium: 9.3 mg/dL (ref 8.9–10.3)
Chloride: 110 mmol/L (ref 98–111)
Creatinine, Ser: 1.15 mg/dL (ref 0.61–1.24)
GFR, Estimated: >60 mL/min (ref >60)
Glucose, Bld: 141 mg/dL — ABNORMAL HIGH (ref 70–99)
Potassium: 4.5 mmol/L (ref 3.5–5.1)
Sodium: 139 mmol/L (ref 135–145)
Total Bilirubin: 0.4 mg/dL (ref 0.3–1.2)
Total Protein: 6.2 g/dL — ABNORMAL LOW (ref 6.5–8.1)

## 2022-04-18 LAB — CBC WITH DIFFERENTIAL/PLATELET
Abs Immature Granulocytes: 0.08 10*3/uL — ABNORMAL HIGH (ref 0.00–0.07)
Basophils Absolute: 0.1 10*3/uL (ref 0.0–0.1)
Basophils Relative: 0 %
Eosinophils Absolute: 0.5 10*3/uL (ref 0.0–0.5)
Eosinophils Relative: 1 %
HCT: 35 % — ABNORMAL LOW (ref 39.0–52.0)
Hemoglobin: 10.9 g/dL — ABNORMAL LOW (ref 13.0–17.0)
Immature Granulocytes: 0 %
Lymphocytes Relative: 76 %
Lymphs Abs: 32.8 10*3/uL — ABNORMAL HIGH (ref 0.7–4.0)
MCH: 29.5 pg (ref 26.0–34.0)
MCHC: 31.1 g/dL (ref 30.0–36.0)
MCV: 94.6 fL (ref 80.0–100.0)
Monocytes Absolute: 6.1 10*3/uL — ABNORMAL HIGH (ref 0.1–1.0)
Monocytes Relative: 14 %
Neutro Abs: 4.1 10*3/uL (ref 1.7–7.7)
Neutrophils Relative %: 9 %
Platelets: 159 10*3/uL (ref 150–400)
RBC: 3.7 MIL/uL — ABNORMAL LOW (ref 4.22–5.81)
RDW: 13.4 % (ref 11.5–15.5)
Smear Review: NORMAL
WBC: 43.7 10*3/uL — ABNORMAL HIGH (ref 4.0–10.5)
nRBC: 0 % (ref 0.0–0.2)

## 2022-04-18 LAB — LACTATE DEHYDROGENASE: LDH: 113 U/L (ref 98–192)

## 2022-04-25 ENCOUNTER — Inpatient Hospital Stay: Payer: Medicare Other | Attending: Oncology | Admitting: Oncology

## 2022-04-25 ENCOUNTER — Encounter: Payer: Self-pay | Admitting: Oncology

## 2022-04-25 VITALS — BP 93/60 | HR 70 | Temp 96.6°F | Resp 17 | Wt 162.8 lb

## 2022-04-25 DIAGNOSIS — C9110 Chronic lymphocytic leukemia of B-cell type not having achieved remission: Secondary | ICD-10-CM

## 2022-04-25 DIAGNOSIS — Z7189 Other specified counseling: Secondary | ICD-10-CM

## 2022-04-25 NOTE — Progress Notes (Signed)
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  In November 2022 he was noted to have a white cell count of 20.1 with predominant lymphocytosis and neutrophilia.   2. 05/21/2021 peripheral blood flowcytometry showed 1) CD5 mostly negative to dim+, CD10-, CD23-, FMC-7+ B cell  lymphoproliferative disorder detected, nonspecific phenotype, 2) CD5+, CD23+ clonal B cell population detected, CLL/SLL phenotype, CD38 indeterminate for prognosis (29%), 18% of leukocytes, <5,000 cells/uL.   3. 07/05/2021, bone marrow biopsy showed hypercellular bone marrow with involvement of a low-grade B-cell lymphoproliferative disorder.  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   Flow cytometry analysis showed major B-cell population representing 56% of all cells expressing and B-cell antigens including CD20.  Half of the B-cell population showed CD5 and CD200 expression associated with extremely thin/negative staining for surface immunoglobulin light chains.  The other half lacks CD5 expression but displays CD200 expression and lambda light chain restrictions.  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  Ki-67 stain is variable in the interfollicular areas, 35% to 30%.  Flow cytometry was  performed on nodal tissue and reported separately by [employer], accession [id number]. In summary, there are two separate B cell clones, in roughly equal proportions:  1) Kappa light chain restricted (dim expression), CD20+ (dim), CD5+, CD23+, CD43+, FMC-  2) Lambda light chain restricted, CD20+, CD5-, CD23+, CD43-, FMC+(dim). The first clone is present in the blood, and is new for this patient in 2022. The second clone was first detected in the blood in 2017, when the patient had fewer than 5,000 lymphocytes per microL, and is persistent in the blood. The bone marrow is also positive for both clones in equal proportions.   Differential is between biclonal CLL/SLL, biclonal marginal zone lymphoma, Lymphoma, or composite lymphoma with both diagnosis   4. PET CT scan in January 2023 showed thoracic and abdominal lymphadenopathy that was moderately hypermetabolic with an SUV ranging between 3-5.  Spleen appeared normal.  Interval history-patient reports doing well overall.  Appetite is good and weight has remained stable.  Denies any drenching night sweats.  Energy levels have been stable.  ECOG PS- 1 Pain scale- 0  Review of systems- Review of Systems  Constitutional:  Negative for chills, fever, malaise/fatigue and weight loss.  HENT:  Negative for congestion, ear discharge and nosebleeds.   Eyes:  Negative for blurred vision.  Respiratory:  Negative for cough, hemoptysis, sputum production, shortness of breath and wheezing.   Cardiovascular:  Negative for chest pain, palpitations, orthopnea and claudication.  Gastrointestinal:  Negative for abdominal pain, blood in stool, constipation, diarrhea, heartburn, melena, nausea and vomiting.  Genitourinary:  Negative for dysuria, flank pain, frequency, hematuria and urgency.  Musculoskeletal:  Negative for back pain, joint pain and myalgias.  Skin:  Negative for rash.  Neurological:  Negative for dizziness, tingling, focal weakness, seizures,  weakness and headaches.  Endo/Heme/Allergies:  Does not bruise/bleed easily.  Psychiatric/Behavioral:  Negative for depression and suicidal ideas. The patient does not have insomnia.       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 sleep study 06/03/2012 - AHI 54.74/hr during total sleep, AHI 49.66/hr during REM   Prostate cancer ([place]) 10/2015   radiation  followed by Dr. Karsten Ro   S/P CABG x 4 2005   Stroke [facility]) 2011   damage to back of brain, difficulty when turning with ambulation   Synovitis and tenosynovitis, unspecified    Thoracic aortic aneurysm without rupture ([place]) 10/2019   4.4 cm by CA 2021   Tubular adenoma of colon    Unspecified vitamin D deficiency      Past Surgical History:  Procedure Laterality Date   BIOPSY THYROID     HX NODULES   BLADDER INSTILLATION N/A 09/13/2021   Procedure: BLADDER INSTILLATION OF GEMCITABINE;  Surgeon: Billey Co, MD;  Location: [facility] ORS;  Service: Urology;  Laterality: N/A;   CARDIAC CATHETERIZATION  02/01/2004   L main with 60% prox stenosis, LAD calcified in prox section with 95% ostial lesion, diffusion 50-60% disease in mid LAD with focal 90% mid LAD stenosis; 1st diagonal coarsely irregular with up to 30% stenosis in prox & mid segments; L Cfx with  3 OMs, AV groove Cfx with 40-50% stenosis; 3rd OM with 50% prox stenosis; RCA with PDA & PLA, PLA with 95% osital lesion (Dr. Gerrie Nordmann)   CARPAL TUNNEL RELEASE     CATARACT EXTRACTION, BILATERAL     COLONOSCOPY     CORONARY ARTERY BYPASS GRAFT  02/02/2004   LIMA to DX1, RIMA to RCA, SVG to distal LAD, SVG to Cfx marginal (Dr. Remus Loffler)   CYSTOSCOPY W/ RETROGRADES  09/13/2021   Procedure: CYSTOSCOPY WITH RETROGRADE PYELOGRAM;  Surgeon: Billey Co, MD;  Location: [facility] ORS;  Service: Urology;;   EYE SURGERY Bilateral    cataract extractions   gold seed marker placement/prostate  02/28/2016   HEMORRHOID SURGERY  2002   JOINT REPLACEMENT Left 2014   knee   JOINT REPLACEMENT Right 05/17/2018   knee   KNEE ARTHROPLASTY Right 05/17/2018   Procedure: COMPUTER ASSISTED TOTAL KNEE ARTHROPLASTY;  Surgeon: Dereck Leep, MD;  Location: [facility] ORS;  Service: Orthopedics;  Laterality: Right;   KNEE ARTHROSCOPY  01/05/2019   Procedure: ARTHROSCOPY KNEE, LYSIS OF ADHESIONS;  Surgeon: Dereck Leep, MD;  Location: [facility] ORS;  Service: Orthopedics;;   MASS EXCISION Left 07/08/2021   Procedure: EXCISION MASS;  Surgeon: Herbert Pun, MD;  Location: [facility] ORS;  Service: General;  Laterality: Left;   NM MYOCAR PERF WALL MOTION  06/2012   lexiscan myoview - normal  low risk study, EF 47%   PAROTIDECTOMY Left 08/04/2017   Procedure: SUPERFICIAL PAROTIDECTOMY;  Surgeon: Beverly Gust, MD;  Location: [facility] ORS;  Service: ENT;  Laterality: Left;   SOFT TISSUE TUMOR RESECTION  1972   of right jaw x3, pathology unknown, possible sarcoma   TONSILLECTOMY AND ADENOIDECTOMY     TOTAL KNEE ARTHROPLASTY Left    Dr. Marry Guan [facility] [place])   TRANSTHORACIC ECHOCARDIOGRAM  06/2012   EF 50-55%, mild MR; LA mild-mod dilated; RVSP increased   TRANSURETHRAL RESECTION OF BLADDER TUMOR N/A 09/13/2021   Procedure: TRANSURETHRAL RESECTION OF BLADDER TUMOR (TURBT);  Surgeon: Billey Co, MD;  Location: [facility]  ORS;  Service: Urology;  Laterality: N/A;   UPPER GASTROINTESTINAL ENDOSCOPY  05/03/2020    Social History   Socioeconomic History   Marital status: Significant Other    Spouse name: Blenda Mounts   Number of children: 3   Years of education: Not on file   Highest education level: Not on file  Occupational History   Occupation: attorney    Comment: still work  Tobacco Use   Smoking status: Never    Passive exposure: Never   Smokeless tobacco: Never  Vaping Use   Vaping Use: Never used  Substance and Sexual Activity   Alcohol use: Yes    Comment: occasional beer every 3-4 week   Drug use: Never   Sexual activity: Not Currently  Other Topics Concern   Not on file  Social History Narrative   Lives alone.   Social Determinants of Health   Financial Resource Strain: Not on file  Food Insecurity: Not on file  Transportation Needs: Not on file  Physical Activity: Not on file  Stress: Not on file  Social Connections: Not on file  Intimate Partner Violence: Not on file    Family History  Problem Relation Age of Onset   Heart disease Mother    Parkinsonism Father    Alzheimer's disease Father    Diabetes Paternal Grandfather    Stomach cancer Cousin    Colon cancer Neg Hx    Esophageal cancer Neg Hx    Rectal cancer Neg Hx    Liver cancer Neg Hx    Colon polyps Neg Hx      Current Outpatient Medications:    acetaminophen (TYLENOL) 325 MG tablet, Take 650 mg by mouth every 6 (six) hours as needed for mild pain or moderate pain., Disp: , Rfl:    Ascorbic Acid (VITAMIN C) 1000 MG tablet, Take 1,000 mg by mouth daily. , Disp: , Rfl:    aspirin EC 81 MG tablet, Take 81 mg by mouth daily with lunch., Disp: , Rfl:    atorvastatin (LIPITOR) 80 MG tablet, Take 80 mg by mouth at bedtime., Disp: , Rfl:    B Complex Vitamins (B COMPLEX 1 PO), Take 1 tablet by mouth daily., Disp: , Rfl:    carvedilol (COREG) 6.25 MG tablet, Take 6.25 mg by mouth 2 (two) times daily with a  meal. , Disp: , Rfl:    clobetasol ointment (TEMOVATE) 0.05 %, Apply twice daily to affected areas on body as needed for rash/itching. Avoid applying to face, groin, and axilla., Disp: 60 g, Rfl: 2   CRANBERRY PO, Take 84 mg by mouth daily., Disp: , Rfl:    Lactobacillus (PROBIOTIC ACIDOPHILUS PO), Take 1 tablet by mouth daily., Disp: , Rfl:    Lysine 1000 MG TABS, Take 1,000 mg by mouth daily., Disp: , Rfl:  magnesium oxide (MAG-OX) 400 MG tablet, Take 400 mg by mouth daily., Disp: , Rfl:    midodrine (PROAMATINE) 5 MG tablet, Take 5 mg by mouth 3 (three) times daily., Disp: , Rfl:    Multiple Vitamins-Minerals (ICAPS AREDS 2 PO), Take 1 capsule by mouth daily. , Disp: , Rfl:    Multiple Vitamins-Minerals (MULTIVITAMIN WITH MINERALS) tablet, Take 1 tablet by mouth daily. 50 +, Disp: , Rfl:    pramipexole (MIRAPEX) 0.5 MG tablet, Take 0.5 mg by mouth at bedtime., Disp: , Rfl:    sacubitril-valsartan (ENTRESTO) 24-26 MG, Take 1 tablet by mouth every 12 (twelve) hours., Disp: , Rfl:    timolol (TIMOPTIC) 0.5 % ophthalmic solution, Place 1 drop into both eyes at bedtime., Disp: , Rfl:    traMADol (ULTRAM) 50 MG tablet, Take 50 mg by mouth every 12 (twelve) hours. May take additional 50 mg if in extreme pain, Disp: , Rfl:    vitamin B-12 (CYANOCOBALAMIN) 1000 MCG tablet, Take 1,000 mcg by mouth daily., Disp: , Rfl:    zinc gluconate 50 MG tablet, Take 50 mg by mouth daily., Disp: , Rfl:    DOCUSATE SODIUM PO, Take 480 mg by mouth at bedtime as needed for constipation. (Patient not taking: Reported on 01/23/2022), Disp: , Rfl:    furosemide (LASIX) 20 MG tablet, Take 20 mg by mouth daily as needed for edema (Weight gain greater than 2 lbs). (Patient not taking: Reported on 01/23/2022), Disp: , Rfl:    GLUCOSAMINE SULFATE PO, Take 1 tablet by mouth daily. (Patient not taking: Reported on 01/23/2022), Disp: , Rfl:    tamsulosin (FLOMAX) 0.4 MG CAPS capsule, Take 0.4 mg by mouth at bedtime. (Patient not  taking: Reported on 01/23/2022), Disp: , Rfl:    tetrahydrozoline 0.05 % ophthalmic solution, Place 1 drop into both eyes daily as needed (for dry eyes). (Patient not taking: Reported on 01/23/2022), Disp: , Rfl:   Physical exam:  Vitals:   04/25/22 1421  BP: 93/60  Pulse: 70  Resp: 17  Temp: (!) 96.6 F (35.9 C)  SpO2: 96%  Weight: 162 lb 12.8 oz (73.8 kg)   Physical Exam Cardiovascular:     Rate and Rhythm: Normal rate and regular rhythm.     Heart sounds: Normal heart sounds.  Pulmonary:     Effort: Pulmonary effort is normal.     Breath sounds: Normal breath sounds.  Abdominal:     General: Bowel sounds are normal.     Palpations: Abdomen is soft.  Lymphadenopathy:     Comments: Bilateral palpable axillary adenopathy  Skin:    General: Skin is warm and dry.  Neurological:     Mental Status: He is alert and oriented to person, place, and time.         Latest Ref Rng & Units 04/18/2022    1:24 PM  CMP  Glucose 70 - 99 mg/dL 141   BUN 8 - 23 mg/dL 27   Creatinine 0.61 - 1.24 mg/dL 1.15   Sodium 135 - 145 mmol/L 139   Potassium 3.5 - 5.1 mmol/L 4.5   Chloride 98 - 111 mmol/L 110   CO2 22 - 32 mmol/L 24   Calcium 8.9 - 10.3 mg/dL 9.3   Total Protein 6.5 - 8.1 g/dL 6.2   Total Bilirubin 0.3 - 1.2 mg/dL 0.4   Alkaline Phos 38 - 126 U/L 80   AST 15 - 41 U/L 29   ALT 0 - 44 U/L 18  Latest Ref Rng & Units 04/18/2022    1:24 PM  CBC  WBC 4.0 - 10.5 K/uL 43.7   Hemoglobin 13.0 - 17.0 g/dL 10.9   Hematocrit 39.0 - 52.0 % 35.0   Platelets 150 - 400 K/uL 159      Assessment and plan- Patient is a 81 y.o. male here for routine follow-up ofRai stage I CLL  Patient's white cell count was fluctuating between 14-18 upon interval March 2023.  It went up to 32.3 in July 2023 and is presently at 43.7.  Lymphocyte doubling time remains around 4 to 5 months.  Platelet counts have remained normal.  Hemoglobin has gradually drifted down from 12.7 last year presently to 10.9.   This is concerning for CLL progression.  Overall patient does not have any B symptoms.  I will monitor his CBC closely at this time and see him back in 2 months with a PET scan prior.  Clinically his lymphadenopathy is no worse today than his previous exam.  We may be inching closer towards starting treatment if his lymphocyte count continues to double relatively quickly and if his anemia continues to worsen.  I explained all this to the patient and his significant other in great detail.  I will be checking additional anemia work-up including CMP ferritin iron studies B12 folate reticulocyte count haptoglobin and LDH in 2 months as well.   Visit Diagnosis 1. CLL (chronic lymphocytic leukemia) ([place])   2. Goals of care, counseling/discussion      Dr. Randa Evens, MD, MPH [facility] [phone] 04/25/2022 4:01 PM

## 2022-04-25 NOTE — Progress Notes (Signed)
Patient states he has a spot on his forehead. Patient states insect seem to be attracted to him and wants to why.

## 2022-04-28 ENCOUNTER — Ambulatory Visit
Admission: RE | Admit: 2022-04-28 | Discharge: 2022-04-28 | Disposition: A | Discharge status: Home / Self Care | Payer: Medicare Other | Source: Ambulatory Visit | Attending: Internal Medicine | Admitting: Internal Medicine

## 2022-04-28 DIAGNOSIS — R270 Ataxia, unspecified: Secondary | ICD-10-CM | POA: Diagnosis present

## 2022-04-28 DIAGNOSIS — M5116 Intervertebral disc disorders with radiculopathy, lumbar region: Secondary | ICD-10-CM | POA: Diagnosis present

## 2022-06-26 ENCOUNTER — Inpatient Hospital Stay: Payer: Medicare Other | Attending: Oncology

## 2022-06-26 ENCOUNTER — Ambulatory Visit
Admission: RE | Admit: 2022-06-26 | Discharge: 2022-06-26 | Disposition: A | Discharge status: Home / Self Care | Payer: Medicare Other | Source: Ambulatory Visit | Attending: Oncology | Admitting: Oncology

## 2022-06-26 DIAGNOSIS — R591 Generalized enlarged lymph nodes: Secondary | ICD-10-CM | POA: Insufficient documentation

## 2022-06-26 DIAGNOSIS — C9110 Chronic lymphocytic leukemia of B-cell type not having achieved remission: Secondary | ICD-10-CM | POA: Insufficient documentation

## 2022-06-26 DIAGNOSIS — I7121 Aneurysm of the ascending aorta, without rupture: Secondary | ICD-10-CM | POA: Diagnosis not present

## 2022-06-26 DIAGNOSIS — I700 Atherosclerosis of aorta: Secondary | ICD-10-CM | POA: Diagnosis not present

## 2022-06-26 DIAGNOSIS — K5730 Diverticulosis of large intestine without perforation or abscess without bleeding: Secondary | ICD-10-CM | POA: Diagnosis not present

## 2022-06-26 LAB — GLUCOSE, CAPILLARY: Glucose-Capillary: 99 mg/dL (ref 70–99)

## 2022-06-26 MED ORDER — FLUDEOXYGLUCOSE F - 18 (FDG) INJECTION
8.9800 | Freq: Once | INTRAVENOUS | Status: AC | PRN
  Administered 2022-06-26: 8.98 via INTRAVENOUS

## 2022-07-02 ENCOUNTER — Inpatient Hospital Stay: Payer: Medicare Other

## 2022-07-02 DIAGNOSIS — C9110 Chronic lymphocytic leukemia of B-cell type not having achieved remission: Secondary | ICD-10-CM

## 2022-07-02 LAB — FOLATE: Folate: 32 ng/mL (ref >5.9)

## 2022-07-02 LAB — COMPREHENSIVE METABOLIC PANEL
ALT: 21 U/L (ref 0–44)
AST: 34 U/L (ref 15–41)
Albumin: 3.8 g/dL (ref 3.5–5.0)
Alkaline Phosphatase: 89 U/L (ref 38–126)
Anion gap: 6 (ref 5–15)
BUN: 27 mg/dL — ABNORMAL HIGH (ref 8–23)
CO2: 26 mmol/L (ref 22–32)
Calcium: 9.6 mg/dL (ref 8.9–10.3)
Chloride: 106 mmol/L (ref 98–111)
Creatinine, Ser: 1.14 mg/dL (ref 0.61–1.24)
GFR, Estimated: >60 mL/min (ref >60)
Glucose, Bld: 102 mg/dL — ABNORMAL HIGH (ref 70–99)
Potassium: 4.9 mmol/L (ref 3.5–5.1)
Sodium: 138 mmol/L (ref 135–145)
Total Bilirubin: 0.4 mg/dL (ref 0.3–1.2)
Total Protein: 6.9 g/dL (ref 6.5–8.1)

## 2022-07-02 LAB — CBC WITH DIFFERENTIAL/PLATELET
Abs Immature Granulocytes: 0.15 10*3/uL — ABNORMAL HIGH (ref 0.00–0.07)
Basophils Absolute: 0.1 10*3/uL (ref 0.0–0.1)
Basophils Relative: 0 %
Eosinophils Absolute: 0.4 10*3/uL (ref 0.0–0.5)
Eosinophils Relative: 1 %
HCT: 36.6 % — ABNORMAL LOW (ref 39.0–52.0)
Hemoglobin: 11.8 g/dL — ABNORMAL LOW (ref 13.0–17.0)
Immature Granulocytes: 0 %
Lymphocytes Relative: 82 %
Lymphs Abs: 69.3 10*3/uL — ABNORMAL HIGH (ref 0.7–4.0)
MCH: 29.1 pg (ref 26.0–34.0)
MCHC: 32.2 g/dL (ref 30.0–36.0)
MCV: 90.4 fL (ref 80.0–100.0)
Monocytes Absolute: 8.7 10*3/uL — ABNORMAL HIGH (ref 0.1–1.0)
Monocytes Relative: 11 %
Neutro Abs: 4.9 10*3/uL (ref 1.7–7.7)
Neutrophils Relative %: 6 %
Platelets: 224 10*3/uL (ref 150–400)
RBC: 4.05 MIL/uL — ABNORMAL LOW (ref 4.22–5.81)
RDW: 14 % (ref 11.5–15.5)
Smear Review: NORMAL
WBC Morphology: ABNORMAL
WBC: 83.5 10*3/uL (ref 4.0–10.5)
nRBC: 0 % (ref 0.0–0.2)

## 2022-07-02 LAB — LACTATE DEHYDROGENASE: LDH: 149 U/L (ref 98–192)

## 2022-07-02 LAB — RETICULOCYTES
Immature Retic Fract: 10 % (ref 2.3–15.9)
RBC.: 4.09 MIL/uL — ABNORMAL LOW (ref 4.22–5.81)
Retic Count, Absolute: 66.7 10*3/uL (ref 19.0–186.0)
Retic Ct Pct: 1.6 % (ref 0.4–3.1)

## 2022-07-02 LAB — IRON AND TIBC
Iron: 51 ug/dL (ref 45–182)
Saturation Ratios: 14 % — ABNORMAL LOW (ref 17.9–39.5)
TIBC: 367 ug/dL (ref 250–450)
UIBC: 316 ug/dL

## 2022-07-02 LAB — FERRITIN: Ferritin: 40 ng/mL (ref 24–336)

## 2022-07-02 LAB — VITAMIN B12: Vitamin B-12: 892 pg/mL (ref 180–914)

## 2022-07-03 LAB — HAPTOGLOBIN: Haptoglobin: 194 mg/dL (ref 38–329)

## 2022-07-04 ENCOUNTER — Encounter: Payer: Self-pay | Admitting: Oncology

## 2022-07-04 ENCOUNTER — Inpatient Hospital Stay (HOSPITAL_BASED_OUTPATIENT_CLINIC_OR_DEPARTMENT_OTHER): Payer: Medicare Other | Admitting: Oncology

## 2022-07-04 ENCOUNTER — Other Ambulatory Visit: Payer: Self-pay | Admitting: *Deleted

## 2022-07-04 ENCOUNTER — Ambulatory Visit: Payer: Medicare Other | Admitting: Oncology

## 2022-07-04 VITALS — BP 85/51 | HR 72 | Temp 97.0°F | Resp 17 | Wt 169.0 lb

## 2022-07-04 DIAGNOSIS — Z7189 Other specified counseling: Secondary | ICD-10-CM

## 2022-07-04 DIAGNOSIS — C9110 Chronic lymphocytic leukemia of B-cell type not having achieved remission: Secondary | ICD-10-CM | POA: Diagnosis not present

## 2022-07-04 NOTE — Progress Notes (Signed)
Patient here for oncology follow-up appointment, concerns of low BP, RSV vaccines and parkinson meds. No new symptoms

## 2022-07-12 NOTE — Progress Notes (Signed)
Hematology/Oncology Consult note [facility]  [phone] [phone]  Patient Care Team: [name], MD as PCP - General   Name of the patient: Wesley [name]  [id number]  1940/09/09   Date of visit: 07/12/22  Diagnosis-Rai stage I CLL  Chief complaint/ Reason for visit-discuss PET scan results and further management  Heme/Onc history:  1. 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  In November 2022 he was noted to have a white cell count of 20.1 with predominant lymphocytosis and neutrophilia.   2. 05/21/2021 peripheral blood flowcytometry showed 1) CD5 mostly negative to dim+, CD10-, CD23-, FMC-7+ B cell  lymphoproliferative disorder detected, nonspecific phenotype, 2) CD5+, CD23+ clonal B cell population detected, CLL/SLL phenotype, CD38 indeterminate for prognosis (29%), 18% of leukocytes, <5,000 cells/uL.   3. 07/05/2021, bone marrow biopsy showed hypercellular bone marrow with involvement of a low-grade B-cell lymphoproliferative disorder.  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   Flow cytometry analysis showed major B-cell population representing 56% of all cells expressing and B-cell antigens including CD20.  Half of the B-cell population showed CD5 and CD200 expression associated with extremely thin/negative staining for surface immunoglobulin light chains.  The other half lacks CD5 expression but displays CD200 expression and lambda light chain restrictions.  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  Ki-67 stain is variable in the interfollicular areas, 93% to 30%.  Flow cytometry was performed on  nodal tissue and reported separately by [employer], accession [id number]. In summary, there are two separate B cell clones, in roughly equal proportions:  1) Kappa light chain restricted (dim expression), CD20+ (dim), CD5+, CD23+, CD43+, FMC-  2) Lambda light chain restricted, CD20+, CD5-, CD23+, CD43-, FMC+(dim). The first clone is present in the blood, and is new for this patient in 2022. The second clone was first detected in the blood in 2017, when the patient had fewer than 5,000 lymphocytes per microL, and is persistent in the blood. The bone marrow is also positive for both clones in equal proportions.   Differential is between biclonal CLL/SLL, biclonal marginal zone lymphoma, Lymphoma, or composite lymphoma with both diagnosis   4. PET CT scan in January 2023 showed thoracic and abdominal lymphadenopathy that was moderately hypermetabolic with an SUV ranging between 3-5.  Spleen appeared normal.    Interval history-patient is here with his significant other.  He reports doing well overall.  Appetite and weight have remained stable.  He has not had any recent falls.  ECOG PS- 1 Pain scale- 0   Review of systems- Review of Systems  Constitutional:  Negative for chills, fever, malaise/fatigue and weight loss.  HENT:  Negative for congestion, ear discharge and nosebleeds.   Eyes:  Negative for blurred vision.  Respiratory:  Negative for cough, hemoptysis, sputum production, shortness of breath and wheezing.   Cardiovascular:  Negative for chest pain, palpitations, orthopnea and claudication.  Gastrointestinal:  Negative for abdominal pain, blood in stool, constipation, diarrhea, heartburn, melena, nausea and vomiting.  Genitourinary:  Negative for dysuria, flank pain, frequency, hematuria and urgency.  Musculoskeletal:  Negative for back pain, joint pain and myalgias.  Skin:  Negative for rash.  Neurological:  Negative for dizziness, tingling, focal weakness, seizures, weakness and  headaches.  Endo/Heme/Allergies:  Does not  bruise/bleed easily.  Psychiatric/Behavioral:  Negative for depression and suicidal ideas. The patient does not have insomnia.       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 sleep study 06/03/2012 - AHI 54.74/hr during total sleep, AHI 49.66/hr during REM   Prostate cancer ([place]) 10/2015   radiation  followed by Dr. Karsten Ro   S/P CABG x 4 2005   Stroke [facility]) 2011   damage to back of brain, difficulty when turning with ambulation   Synovitis and tenosynovitis, unspecified    Thoracic aortic aneurysm without rupture ([place]) 10/2019   4.4 cm by CA 2021   Tubular adenoma of colon    Unspecified vitamin D deficiency      Past Surgical History:  Procedure Laterality Date   BIOPSY THYROID     HX NODULES   BLADDER INSTILLATION N/A 09/13/2021   Procedure: BLADDER INSTILLATION OF GEMCITABINE;  Surgeon: Billey Co, MD;  Location: [facility] ORS;  Service: Urology;  Laterality: N/A;   CARDIAC CATHETERIZATION  02/01/2004   L main with 60% prox stenosis, LAD calcified in prox section with 95% ostial lesion, diffusion 50-60% disease in mid LAD with focal 90% mid LAD stenosis; 1st diagonal coarsely irregular with up to 30% stenosis in prox & mid segments; L Cfx with 3 OMs, AV  groove Cfx with 40-50% stenosis; 3rd OM with 50% prox stenosis; RCA with PDA & PLA, PLA with 95% osital lesion (Dr. Gerrie Nordmann)   CARPAL TUNNEL RELEASE     CATARACT EXTRACTION, BILATERAL     COLONOSCOPY     CORONARY ARTERY BYPASS GRAFT  02/02/2004   LIMA to DX1, RIMA to RCA, SVG to distal LAD, SVG to Cfx marginal (Dr. Remus Loffler)   CYSTOSCOPY W/ RETROGRADES  09/13/2021   Procedure: CYSTOSCOPY WITH RETROGRADE PYELOGRAM;  Surgeon: Billey Co, MD;  Location: [facility] ORS;  Service: Urology;;   EYE SURGERY Bilateral    cataract extractions   gold seed marker placement/prostate  02/28/2016   HEMORRHOID SURGERY  2002   JOINT REPLACEMENT Left 2014   knee   JOINT REPLACEMENT Right 05/17/2018   knee   KNEE ARTHROPLASTY Right 05/17/2018   Procedure: COMPUTER ASSISTED TOTAL KNEE ARTHROPLASTY;  Surgeon: Dereck Leep, MD;  Location: [facility] ORS;  Service: Orthopedics;  Laterality: Right;   KNEE ARTHROSCOPY  01/05/2019   Procedure: ARTHROSCOPY KNEE, LYSIS OF ADHESIONS;  Surgeon: Dereck Leep, MD;  Location: [facility] OR[facility];  Service: Orthopedics;;   MASS EXCISION Left 07/08/2021   Procedure: EXCISION MASS;  Surgeon: Herbert Pun, MD;  Location: [facility];  Service: General;  Laterality: Left;   NM MYOCAR PERF WALL MOTION  06/2012  lexiscan myoview - normal low risk study, EF 47%   PAROTIDECTOMY Left 08/04/2017   Procedure: SUPERFICIAL PAROTIDECTOMY;  Surgeon: Beverly Gust, MD;  Location: [facility] OR[facility];  Service: ENT;  Laterality: Left;   SOFT TISSUE TUMOR RESECTION  1972   of right jaw x3, pathology unknown, possible sarcoma   TONSILLECTOMY AND ADENOIDECTOMY     TOTAL KNEE ARTHROPLASTY Left    Dr. Marry Guan [facility] [place])   TRANSTHORACIC ECHOCARDIOGRAM  06/2012   EF 50-55%, mild MR; LA mild-mod dilated; RVSP increased   TRANSURETHRAL RESECTION OF BLADDER TUMOR N/A 09/13/2021   Procedure: TRANSURETHRAL RESECTION OF BLADDER TUMOR (TURBT);  Surgeon: Billey Co, MD;  Location: [facility] OR[facility];   Service: Urology;  Laterality: N/A;   UPPER GASTROINTESTINAL ENDOSCOPY  05/03/2020    Social History   Socioeconomic History   Marital status: Significant Other    Spouse name: Blenda Mounts   Number of children: 3   Years of education: Not on file   Highest education level: Not on file  Occupational History   Occupation: attorney    Comment: still work  Tobacco Use   Smoking status: Never    Passive exposure: Never   Smokeless tobacco: Never  Vaping Use   Vaping Use: Never used  Substance and Sexual Activity   Alcohol use: Yes    Comment: occasional beer every 3-4 week   Drug use: Never   Sexual activity: Not Currently  Other Topics Concern   Not on file  Social History Narrative   Lives alone.   Social Determinants of Health   Financial Resource Strain: Not on file  Food Insecurity: Not on file  Transportation Needs: Not on file  Physical Activity: Not on file  Stress: Not on file  Social Connections: Not on file  Intimate Partner Violence: Not on file    Family History  Problem Relation Age of Onset   Heart disease Mother    Parkinsonism Father    Alzheimer's disease Father    Diabetes Paternal Grandfather    Stomach cancer Cousin    Colon cancer Neg Hx    Esophageal cancer Neg Hx    Rectal cancer Neg Hx    Liver cancer Neg Hx    Colon polyps Neg Hx      Current Outpatient Medications:    acetaminophen (TYLENOL) 325 MG tablet, Take 650 mg by mouth every 6 (six) hours as needed for mild pain or moderate pain., Disp: , Rfl:    Ascorbic Acid (VITAMIN C) 1000 MG tablet, Take 1,000 mg by mouth daily. , Disp: , Rfl:    aspirin EC 81 MG tablet, Take 81 mg by mouth daily with lunch., Disp: , Rfl:    atorvastatin (LIPITOR) 80 MG tablet, Take 80 mg by mouth at bedtime., Disp: , Rfl:    B Complex Vitamins (B COMPLEX 1 PO), Take 1 tablet by mouth daily., Disp: , Rfl:    carvedilol (COREG) 6.25 MG tablet, Take 6.25 mg by mouth 2 (two) times daily with a meal. ,  Disp: , Rfl:    clobetasol ointment (TEMOVATE) 0.05 %, Apply twice daily to affected areas on body as needed for rash/itching. Avoid applying to face, groin, and axilla., Disp: 60 g, Rfl: 2   CRANBERRY PO, Take 84 mg by mouth daily., Disp: , Rfl:    DOCUSATE SODIUM PO, Take 480 mg by mouth at bedtime as needed for constipation., Disp: , Rfl:    GLUCOSAMINE SULFATE PO, Take 1 tablet by  mouth daily., Disp: , Rfl:    Lactobacillus (PROBIOTIC ACIDOPHILUS PO), Take 1 tablet by mouth daily., Disp: , Rfl:    Lysine 1000 MG TABS, Take 1,000 mg by mouth daily., Disp: , Rfl:    magnesium oxide (MAG-OX) 400 MG tablet, Take 400 mg by mouth daily., Disp: , Rfl:    metoprolol succinate (TOPROL-XL) 25 MG 24 hr tablet, TAKE 1/2 TABLET( 12.5 MG TOTAL) BY MOUTH ONCE DAILY, Disp: , Rfl:    midodrine (PROAMATINE) 5 MG tablet, Take 5 mg by mouth 3 (three) times daily., Disp: , Rfl:    Multiple Vitamins-Minerals (ICAPS AREDS 2 PO), Take 1 capsule by mouth daily. , Disp: , Rfl:    Multiple Vitamins-Minerals (MULTIVITAMIN WITH MINERALS) tablet, Take 1 tablet by mouth daily. 50 +, Disp: , Rfl:    pramipexole (MIRAPEX) 0.5 MG tablet, Take 0.5 mg by mouth at bedtime., Disp: , Rfl:    sacubitril-valsartan (ENTRESTO) 24-26 MG, Take 1 tablet by mouth every 12 (twelve) hours., Disp: , Rfl:    spironolactone (ALDACTONE) 25 MG tablet, Take by mouth., Disp: , Rfl:    timolol (TIMOPTIC) 0.5 % ophthalmic solution, Place 1 drop into both eyes at bedtime., Disp: , Rfl:    traMADol (ULTRAM) 50 MG tablet, Take 50 mg by mouth every 12 (twelve) hours. May take additional 50 mg if in extreme pain, Disp: , Rfl:    vitamin B-12 (CYANOCOBALAMIN) 1000 MCG tablet, Take 1,000 mcg by mouth daily., Disp: , Rfl:    zinc gluconate 50 MG tablet, Take 50 mg by mouth daily., Disp: , Rfl:    furosemide (LASIX) 20 MG tablet, Take 20 mg by mouth daily as needed for edema (Weight gain greater than 2 lbs). (Patient not taking: Reported on 01/23/2022), Disp: ,  Rfl:    tamsulosin (FLOMAX) 0.4 MG CAPS capsule, Take 0.4 mg by mouth at bedtime. (Patient not taking: Reported on 01/23/2022), Disp: , Rfl:    tetrahydrozoline 0.05 % ophthalmic solution, Place 1 drop into both eyes daily as needed (for dry eyes). (Patient not taking: Reported on 01/23/2022), Disp: , Rfl:   Physical exam:  Vitals:   07/04/22 1344 07/04/22 1347  BP:  (!) 85/51  Pulse: 71 72  Resp: 17 17  Temp: 97.8 F (36.6 C) (!) 97 F (36.1 C)  TempSrc:  Tympanic  SpO2: 97% 96%  Weight:  169 lb (76.7 kg)   Physical Exam Cardiovascular:     Rate and Rhythm: Normal rate and regular rhythm.     Heart sounds: Normal heart sounds.  Pulmonary:     Effort: Pulmonary effort is normal.     Breath sounds: Normal breath sounds.  Abdominal:     General: Bowel sounds are normal.     Palpations: Abdomen is soft.     Comments: No palpable splenomegaly  Lymphadenopathy:     Comments: B/l palpable axillary adenopathy  Skin:    General: Skin is warm and dry.  Neurological:     Mental Status: He is alert and oriented to person, place, and time.         Latest Ref Rng & Units 07/02/2022    1:37 PM  CMP  Glucose 70 - 99 mg/dL 102   BUN 8 - 23 mg/dL 27   Creatinine 0.61 - 1.24 mg/dL 1.14   Sodium 135 - 145 mmol/L 138   Potassium 3.5 - 5.1 mmol/L 4.9   Chloride 98 - 111 mmol/L 106   CO2 22 - 32 mmol/L  26   Calcium 8.9 - 10.3 mg/dL 9.6   Total Protein 6.5 - 8.1 g/dL 6.9   Total Bilirubin 0.3 - 1.2 mg/dL 0.4   Alkaline Phos 38 - 126 U/L 89   AST 15 - 41 U/L 34   ALT 0 - 44 U/L 21       Latest Ref Rng & Units 07/02/2022    1:37 PM  CBC  WBC 4.0 - 10.5 K/uL 83.5   Hemoglobin 13.0 - 17.0 g/dL 11.8   Hematocrit 39.0 - 52.0 % 36.6   Platelets 150 - 400 K/uL 224     No images are attached to the encounter.  NM PET Image Restag (PS) Skull Base To Thigh  Result Date: 06/28/2022 CLINICAL DATA:  Subsequent treatment strategy for CLL. EXAM: NUCLEAR MEDICINE PET SKULL BASE TO THIGH  TECHNIQUE: 9.0 mCi F-18 FDG was injected intravenously. Full-ring PET imaging was performed from the skull base to thigh after the radiotracer. CT data was obtained and used for attenuation correction and anatomic localization. Fasting blood glucose: 99 mg/dl COMPARISON:  07/01/2021 FINDINGS: Mediastinal blood pool activity: SUV max 1.5 Liver activity: SUV max 2.3 NECK: Bilateral hypermetabolic cervical lymphadenopathy evident. Index left supraclavicular node measures 16 mm short axis on 57/2 with SUV max = 3.8. This has increased from 9 mm previously with SUV max = 3.5 on prior study. Incidental CT findings: None. CHEST: Hypermetabolic lymphadenopathy is seen in both axillary regions, progressive in size in the interval. Subpectoral lymphadenopathy evident. Index 14 mm right axillary node measured previously is 26 mm today on image 86/2. SUV max = 4.4 today compared to 4.4 previously. Index 16 mm left subpectoral node seen previously has decreased substantially in the interval. 19 mm short axis left axillary node on 81/2 was 11 mm previously. SUV max = 4.1 today compared to 4.1 previously. Similar lymphadenopathy in the left thoracic inlet and left mediastinum. Incidental CT findings: Heart is enlarged. Coronary artery calcification is evident. Status post CABG. Mild atherosclerotic calcification is noted in the wall of the thoracic aorta. Ascending thoracic aorta measures 4.9 cm diameter, similar to prior. ABDOMEN/PELVIS: Interval increase in upper abdominal lymphadenopathy. 2.2 cm gastrohepatic ligament lymph node on image 144/2 was 1.4 cm previously (remeasured). SUV max = 3.4 today compared to 2.4 previously. Index left para-aortic node measured previously at 3.1 cm is 3.4 cm when remeasured in a similar fashion today (181/2). SUV max = 3.0 today compared to 5.2 previously. Mesenteric disease in the left upper quadrant is similar to prior. Index 2.0 cm node measured previously is 2.4 cm short axis today on  image 161/2 with SUV max = 3.9 today compared to 5.4 previously. Incidental CT findings: Hepatic and renal cysts again noted. There is mild atherosclerotic calcification of the abdominal aorta without aneurysm. Diverticular changes are noted in the left colon without diverticulitis. Small bilateral groin hernias contain only fat. SKELETON: No suspicious focal marrow hypermetabolism. Incidental CT findings: No worrisome lytic or sclerotic osseous abnormality. IMPRESSION: 1. Interval mixed response to therapy. Generally, the lymphadenopathy in the neck, chest, and abdomen measures minimally larger in the interval on CT imaging. Hypermetabolism in the lymphadenopathy is variably stable in some areas, minimally increased in some areas, and minimally decreased in others. A particular index left subpectoral node measured on the previous PET-CT appears to have resolved completely in the interval. 2. No suspicious hypermetabolism in the bony anatomy or solid organ anatomy of the abdomen/pelvis. No suspicious pulmonary nodule or mass. 3. Similar  appearance of 4.9 cm diameter ascending thoracic aorta. Ascending thoracic aortic aneurysm. Recommend semi-annual imaging followup by CTA or MRA and referral to cardiothoracic surgery if not already obtained. This recommendation follows 2010 ACCF/AHA/AATS/ACR/ASA/SCA/SCAI/SIR/STS/SVM Guidelines for the Diagnosis and Management of Patients With Thoracic Aortic Disease. Circulation. 2010; 121: H631-S970. Aortic aneurysm NOS (ICD10-I71.9) 4. Left colonic diverticulosis without diverticulitis. 5. Bilateral groin hernias contain only fat. 6.  Aortic Atherosclerosis (ICD10-I70.0). Electronically Signed   By: Misty Stanley M.D.   On: 06/28/2022 06:37     Assessment and plan- Patient is a 82 y.o. male with history of's Rai stage I CLL here for routine follow-up and discussion of PET CT scan  Patient's CLL has been monitoredConservatively without starting any active chest pain so far.   He has no B symptoms.  However his lymphocytosis has been progressively worsening since March 2023.  White cell count was 17 in March 2023 with a lymphocyte count of 13.8.  In 4 months his white count was almost double at 32 with an absolute lymphocyte count of 23.  5 months thereafter his white count is 83 with a absolute lymphocyte count of 69 thereby indicating lymphocyte doubling time of less than 6 months.  No thrombocytopenia so far his baseline hemoglobin was about 13 until 2019 and he has gradually developed anemia over the last 1 year with a hemoglobin that has been around 11.    I have reviewed PET CT scan images independently and discussed findings with the patient which has shown gradual enlargement of his generalized lymphadenopathy involving the neck chest and abdomen but the SUV in these areas remains less than 5 and therefore it is unlikely that he has transformed from a low-grade CLL to a high-grade diffuse large B-cell lymphoma.  Also his adenopathy is not bulky based on his PET scan.  I do feel that with his rapidly doubling lymphocyte count we are going to need to start treatment at some point soon over the next 3 to 4 months.  I will plan to get IGHV mutation and FISH for leukemia testing done today.  I will decide to pursue a bone marrow biopsy when we decide to start treatment.  I did briefly discuss the role of BTK inhibitors in the treatment of CLL which would be the recommended treatment given his age and comorbidities.  I would like to use second-generation BTK inhibitors such as acalabrutinib or Zanubrutinib over ibrutinib. These drugs are given until progression or toxicity.  Discussed risks and benefits of BTK inhibitors including all but not limited to nausea vomiting diarrhea, low blood counts, skin rash, abnormal LFTs.  Cardiovascular side effects mainly include atrial fibrillation which is seen in less than 5% of the cases and hypertension.  Patient has a pre-existing history  of coronary artery disease s/p CABG but this should not be necessarily affected by BTK inhibitors.  Based on his prior FISH testing for CLL patient was not found to have any T p53 mutation.No evidence of 13 q. mutation IGHV was unmutated.  If he has the same findings and his repeat testing unmutated IgH we will follow under high risk CLL.Based on elevated [place] trial single agent acalabrutinib had a 4-year PFS of 78% which was higher than seen with chlorambucil obinutuzumab group.  Overall survival at 4 years was 88%.  Patient would like to think about all of this.  He is also willing to see [facility] for a second opinion I will refer him for the same.  Patient  understands that he does not have to start treatment just yet but with could be getting to that point in a few months time   Total face to face encounter time for this patient visit was 45 min.     Visit Diagnosis 1. CLL (chronic lymphocytic leukemia) ([place])   2. Goals of care, counseling/discussion      Dr. Randa Evens, MD, MPH [facility] at [facility] [phone] 07/12/2022 7:35 PM

## 2022-07-14 ENCOUNTER — Telehealth: Payer: Self-pay | Admitting: *Deleted

## 2022-07-14 NOTE — Telephone Encounter (Signed)
I faxed the notes, insurance, demographics and pathology reports and dr Janese Banks note to [phone] and got transmission went through

## 2022-07-15 DIAGNOSIS — C8510 Unspecified B-cell lymphoma, unspecified site: Principal | ICD-10-CM

## 2022-07-16 ENCOUNTER — Telehealth: Payer: Self-pay | Admitting: *Deleted

## 2022-07-16 NOTE — Telephone Encounter (Signed)
Judeen Hammans- any updates? Dr. Julieta Bellini

## 2022-07-16 NOTE — Telephone Encounter (Signed)
Patient POA called asking about the referral to [facility]. He has been waiting 3 weeks to hear about an appointment and has not heard form anyone. She requests we check and call the patient back regarding this [phone]

## 2022-07-23 ENCOUNTER — Other Ambulatory Visit: Admit: 2022-07-23 | Discharge: 2022-07-23 | Payer: MEDICARE

## 2022-07-23 ENCOUNTER — Ambulatory Visit: Admit: 2022-07-23 | Discharge: 2022-07-23 | Payer: MEDICARE | Attending: Hematology | Primary: Hematology

## 2022-07-23 DIAGNOSIS — C9110 Chronic lymphocytic leukemia of B-cell type not having achieved remission: Principal | ICD-10-CM

## 2022-07-23 DIAGNOSIS — D509 Iron deficiency anemia, unspecified: Principal | ICD-10-CM

## 2022-07-23 DIAGNOSIS — Z1159 Encounter for screening for other viral diseases: Principal | ICD-10-CM

## 2022-07-23 DIAGNOSIS — Z114 Encounter for screening for human immunodeficiency virus [HIV]: Principal | ICD-10-CM

## 2022-07-23 DIAGNOSIS — C8510 Unspecified B-cell lymphoma, unspecified site: Principal | ICD-10-CM

## 2022-07-23 MED ORDER — ACALABRUTINIB MALEATE 100 MG TABLET
ORAL_TABLET | Freq: Two times a day (BID) | ORAL | 11 refills | 30 days | Status: CP
Start: 2022-07-23 — End: ?
  Filled 2022-08-28: qty 60, 30d supply, fill #0

## 2022-07-23 MED ORDER — FERROUS SULFATE 325 MG (65 MG IRON) TABLET
ORAL_TABLET | ORAL | 11 refills | 60 days | Status: CP
Start: 2022-07-23 — End: 2023-07-23

## 2022-07-24 DIAGNOSIS — C9110 Chronic lymphocytic leukemia of B-cell type not having achieved remission: Principal | ICD-10-CM

## 2022-07-27 DIAGNOSIS — C9110 Chronic lymphocytic leukemia of B-cell type not having achieved remission: Principal | ICD-10-CM

## 2022-07-29 DIAGNOSIS — C9110 Chronic lymphocytic leukemia of B-cell type not having achieved remission: Principal | ICD-10-CM

## 2022-07-30 DIAGNOSIS — C9110 Chronic lymphocytic leukemia of B-cell type not having achieved remission: Principal | ICD-10-CM

## 2022-07-31 DIAGNOSIS — C9110 Chronic lymphocytic leukemia of B-cell type not having achieved remission: Principal | ICD-10-CM

## 2022-08-05 ENCOUNTER — Ambulatory Visit: Admit: 2022-08-05 | Discharge: 2022-08-06 | Payer: MEDICARE

## 2022-08-05 ENCOUNTER — Other Ambulatory Visit: Admit: 2022-08-05 | Discharge: 2022-08-06 | Payer: MEDICARE

## 2022-08-05 DIAGNOSIS — C9110 Chronic lymphocytic leukemia of B-cell type not having achieved remission: Principal | ICD-10-CM

## 2022-08-08 ENCOUNTER — Inpatient Hospital Stay: Payer: Medicare Other | Attending: Oncology

## 2022-08-08 DIAGNOSIS — C9110 Chronic lymphocytic leukemia of B-cell type not having achieved remission: Principal | ICD-10-CM

## 2022-08-08 DIAGNOSIS — Z5111 Encounter for antineoplastic chemotherapy: Principal | ICD-10-CM

## 2022-08-08 DIAGNOSIS — Z1159 Encounter for screening for other viral diseases: Principal | ICD-10-CM

## 2022-08-08 MED ORDER — ALLOPURINOL 300 MG TABLET
ORAL_TABLET | Freq: Every day | ORAL | 1 refills | 30 days | Status: CP
Start: 2022-08-08 — End: 2023-08-08

## 2022-08-12 DIAGNOSIS — C9110 Chronic lymphocytic leukemia of B-cell type not having achieved remission: Principal | ICD-10-CM

## 2022-08-15 ENCOUNTER — Telehealth: Payer: Self-pay | Admitting: *Deleted

## 2022-08-15 NOTE — Telephone Encounter (Signed)
[name] states that he will be treated at [facility] possibly  next week . She says that Wesley [name] is comfortable with [facility] because he was there for another cancer several years ago. He wanted to thank Dr. Janese Banks for seeing him and get bone marrow bx. He wants his future appts here cancelled.

## 2022-08-15 NOTE — Telephone Encounter (Signed)
I spoke to Janese Banks today and let her know that connie said to tahnk Dr. Janese Banks for seeing her and they did do second opinion at [facility] and he wants to stay there. She asked to cancel the future appts here. I have cancelled them

## 2022-08-17 MED ORDER — PREDNISONE 50 MG TABLET
ORAL_TABLET | Freq: Every day | ORAL | 0 refills | 3 days | Status: CP
Start: 2022-08-17 — End: 2022-08-20

## 2022-08-18 NOTE — Unmapped (Signed)
[facility] Specialty Medication Onboarding    Specialty Medication: Calquence 100 mg tablets  Prior Authorization: Approved   Financial Assistance: Yes - grant approved as secondary   Final Copay/Day Supply: $0 / 30    Insurance Restrictions:     Notes to Pharmacist:     The triage team has completed the benefits investigation and has determined that the patient is able to fill this medication at [facility]. Please contact the patient to complete the onboarding or follow up with the prescribing physician as needed.

## 2022-08-19 NOTE — Unmapped (Signed)
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  I am speaking to the patient.    Was a Nurse, learning disability used for this call? No    Verified patient's date of birth / HIPAA.    Specialty medication(s) to be sent: Hematology/Oncology: Calquence    Non-specialty medications/supplies to be sent: none    Medications not needed at this time: none     Calquence (acalabrutinib)    Medication & Administration     Dosage:  Take 1 tablet (100 mg total) by mouth two (2) times a day. Swallow whole with water. Do not to chew, crush, dissolve, or cut tablets.    Administration:   Take with or without food, approximately 12 hours apart.   Swallow tablet whole with a full glass of water; do not open, break, or chew capsules.     Adherence/Missed dose instructions: Take a missed dose as soon as you think about it. If it has been more than 3 hours since the missed dose, skip the missed dose and go back to your normal time.  Do not take 2 doses at the same time or extra doses.    Goals of Therapy     To bring about and prolong progression free remission.  To improve the patient's quality of life.    Side Effects & Monitoring Parameters      Muscle pain, back pain, bone pain, joint pain, neck pain   Headache   Fatigue, loss of strength and energy   Common cold symptoms   Diarrhea   Nausea, vomiting   Abdominal pain   Constipation    The following side effects should be reported to the provider:  Signs of infection (fever >100.4, chills, mouth sores, sputum production)  Signs of bleeding (vomiting or coughing up blood, blood that looks like coffee grounds, blood in the urine or black, red tarry stools, bruising that gets bigger without reason, any persistent or severe bleeding)  Severe headache, dizziness or passing out  Severe chest pain, fast or abnormal heartbeat, shortness of breath  Weakness on 1 side of the body, trouble speaking or thinking, change in balance, drooping on one side of the face, or blurred eyesight.  Signs of anaphylaxis (wheezing, chest tightness, swelling of face, lips, tongue or throat)    Monitoring Parameters:  CBC (was monitored monthly in studies).   Evaluate pregnancy status prior to use in females of reproductive potential.   Atrial fibrillation and atrial flutter  Signs/symptoms of bleeding (in patients receiving antiplatelet or anticoagulant therapies), infection, and secondary malignancies  Adherence    Contraindications, Warnings, & Precautions     Bone marrow suppression: Grade 3 or 4 cytopenias including neutropenia, anemia, and thrombocytopenia have occurred in patients with hematologic malignancies treated with acalabrutinib (as a single agent)  Cardiovascular adverse effects: Atrial fibrillation and atrial flutter (any grade) occurred in a small percentage of patients with hematologic malignancies treated with acalabrutinib (as a single agent); grade 3 events were reported.  Hemorrhage: Serious hemorrhagic events (some fatal) have been reported in patients with hematologic malignancies who received acalabrutinib.   Infection: Serious bacterial, viral, or fungal infections (including fatal events and opportunistic infections) have occurred in patients with hematologic malignancies treated with acalabrutinib (as a single agent).  Secondary malignancies: Second primary malignancies, including non-skin carcinomas, have occurred in about one-tenth of patients with hematologic malignancies treated  with acalabrutinib (as a single agent); the most frequent second primary malignancy was skin cancer.     Drug/Food Interactions     Medication list reviewed in Epic. The patient was instructed to inform the care team before taking any new medications or supplements. No drug interactions identified.   Avoid concomitant use with proton pump inhibitors  Administer 2 hours prior to H2-receptor antagonists and separate from antacids by at least 2 hours.  Administration with a high-fat, high-calorie meal results in Cmax decreased by 73% and Tmax delayed 1 to 2 hours.  Avoid live vaccines  No grapefruit / grapefruit juice or [place]ville oranges    Storage, Handling Precautions, & Disposal     Store at room temperature in the original container (do not use a pillbox or store with other medications).   Caregivers helping administer medication should wear gloves and wash hands immediately after.    Keep the lid tightly closed. Keep out of the reach of children and pets.  Do not flush down a toilet or pour down a drain unless instructed to do so.  Check with your local police department or fire station about drug take-back programs in your area.     Current Medications (including OTC/herbals), Comorbidities and Allergies     Current Outpatient Medications   Medication Sig Dispense Refill    acalabrutinib (CALQUENCE) 100 mg tablet Take 1 tablet (100 mg total) by mouth two (2) times a day. Swallow whole with water. Do not to chew, crush, dissolve, or cut tablets. 60 tablet 11    acetaminophen (TYLENOL) 325 MG tablet Take 1.5 tablets (487.5 mg total) by mouth nightly. 100 tablet 2    allopurinol (ZYLOPRIM) 300 MG tablet Take 1 tablet (300 mg total) by mouth daily. 30 tablet 1    ascorbic acid, vitamin C, (VITAMIN C) 1000 MG tablet Take 1 tablet (1,000 mg total) by mouth.      aspirin (ECOTRIN) 81 MG tablet Take 1 tablet (81 mg total) by mouth.      atorvastatin (LIPITOR) 80 MG tablet       b complex vitamins (SUPER B-50 COMPLEX) capsule Take 1 capsule by mouth daily. 30 capsule 11    bimatoprost (LUMIGAN) 0.01 % Drop Administer 1 drop to both eyes nightly. 30 mL 0    carvedilol (COREG) 6.25 MG tablet Take 1 tablet (6.25 mg total) by mouth two (2) times a day.      cholecalciferol, vitamin D3-250 mcg, 10,000 unit,, 250 mcg (10,000 unit) capsule Take 0.5 capsules (125 mcg total) by mouth daily. 120 capsule 2    coenzyme Q10 200 mg capsule Take 1 capsule (200 mg total) by mouth.      cranberry extract 50 mg Chew Chew 50 mg/5 mL  in the morning. 1 tablet 0    cyanocobalamin, vitamin B-12, 1000 MCG tablet Take 0.5 tablets (500 mcg total) by mouth daily. 30 tablet 0    diphenhydrAMINE (BENADRYL) 25 mg capsule Take 1 capsule (25 mg total) by mouth nightly. 24 capsule 2    ENTRESTO 24-26 mg tablet Take 1 tablet by mouth.      ferrous sulfate 325 (65 FE) MG tablet Take 1 tablet (325 mg total) by mouth every other day. 30 tablet 11    fish,bora,flax oils-om3,6,9no1 (OMEGA 3-6-9) 1,200 mg cap Take 1 tablet by mouth in the morning. 30 capsule 0    L.acidophil-L.plantar-Bifido 7 15 billion cell cap Take 5 mg by mouth in the morning. 30 capsule 0  lysine 1,000 mg Tab Take 1,000 mg by mouth.      magnesium gluconate (MAGONATE) 500 mg (27 mg elem magnesium) tablet Take 1 tablet by mouth daily. 30 tablet 0    meloxicam (MOBIC) 7.5 MG tablet Take 1 tablet (7.5 mg total) by mouth daily. 30 tablet 2    mv-min-FA-vit K-lutein-zeaxant (PRESERVISION AREDS 2 PLUS MV) 200 mcg-15 mcg- 5 mg-1 mg cap Take 1 tablet by mouth in the morning. 30 capsule 0    predniSONE (DELTASONE) 50 MG tablet Take 1 tablet (50 mg total) by mouth daily for 3 days. Start taking 3 days prior to obinutuzumab infusion 3 tablet 0    selenium 200 mcg Tab Take 1 tablet (200 mcg total) by mouth.      tamsulosin (FLOMAX) 0.4 mg capsule       traMADol (ULTRAM) 50 mg tablet TAKE 1 TO 2 TABLETS BY MOUTH THREE TIMES DAILY AS NEEDED FOR PAIN      vitamin E mixed 400 unit cap Take 400 mg by mouth in the morning. 30 capsule 0    zinc gluconate 50 mg (7 mg elemental zinc) tablet Take 1 tablet (50 mg total) by mouth daily.       No current facility-administered medications for this visit.       Allergies   Allergen Reactions    Amoxicillin Hives, Nausea Only and Other (See Comments)     GI upset       Patient Active Problem List   Diagnosis    CLL (chronic lymphocytic leukemia) (CMS-HCC)    Encounter for antineoplastic chemotherapy    Encounter for screening for other viral diseases       Reviewed and up to date in Epic.    Appropriateness of Therapy     Acute infections noted within Epic:  No active infections  Patient reported infection: None    Is medication and dose appropriate based on diagnosis and infection status? Yes    Prescription has been clinically reviewed: Yes    Patient-Reported Symptoms Tracker for Cancer Patients on Oral Chemotherapy     Oral chemotherapy medication name(s):    Dose and frequency:    Oral Chemotherapy Start Date:    Baseline?    Clinic(s) visited:      Symptom Grouping Question Patient Response   Digestion and Eating Have you felt sick to your stomach?      Had diarrhea?      Constipated?      Not wanting to eat?      Comments      Sleep and Pain Felt very tired even after you rest?      Pain due to cancer medication or cancer?      Comments     Other Side Effects Numbness or tingling in hands and/or feet?      Felt short of breath?      Mouth or throat Sores?      Rash?      Palmar-plantar erythrodysesthesia syndrome?      Rash - acneiform?      Rash - maculo-papular?      How many days over the past month did your cancer medication or cancer keep you from your normal activities?  Write in number of days, 0-30:       Other side effects or things you would like to discuss?      Comments?     Adherence  In the last 30 days, on how many days  did you miss at least one dose of any of your [drug name]? Write in number of days, 0-30:       What reasons are you having trouble taking your medication [pharmacist: check all that apply]? Specify chemotherapy cycle:             Comments:        Comments       Optional Symptom Tracking Comments:      Financial Information     Medication Assistance provided: Prior Authorization and [employer]    Anticipated copay of $0 / 30 days reviewed with patient. Verified delivery address.    Delivery Information     Scheduled delivery date: 08/29/22    Expected start date: 09/03/22    Patient was notified of new phone menu: Yes    Medication will be delivered via Next Day Courier to the prescription address in [facility].  This shipment will not require a signature.      Explained the services we provide at [facility] and that each month we would call to set up refills.  Stressed importance of returning phone calls so that we could ensure they receive their medications in time each month.  Informed patient that we should be setting up refills 7-10 days prior to when they will run out of medication.  A pharmacist will reach out to perform a clinical assessment periodically.  Informed patient that a welcome packet, containing information about our pharmacy and other support services, a Notice of Privacy Practices, and a drug information handout will be sent.      The patient or caregiver noted above participated in the development of this care plan and knows that they can request review of or adjustments to the care plan at any time.      Patient or caregiver verbalized understanding of the above information as well as how to contact the pharmacy at [phone] option 4 with any questions/concerns.  The pharmacy is open Monday through Friday 8:30am-4:30pm.  A pharmacist is available 24/7 via pager to answer any clinical questions they may have.    Patient Specific Needs     Does the patient have any physical, cognitive, or cultural barriers? No    Does the patient have adequate living arrangements? (i.e. the ability to store and take their medication appropriately) Yes    Did you identify any home environmental safety or security hazards? No    Patient prefers to have medications discussed with  Patient     Is the patient or caregiver able to read and understand education materials at a high school level or above? Yes    Patient's primary language is  English     Is the patient high risk? No    SOCIAL DETERMINANTS OF HEALTH     At the [facility], we have learned that life circumstances - like trouble affording food, housing, utilities, or transportation can affect the health of many of our patients.   That is why we wanted to ask: are you currently experiencing any life circumstances that are negatively impacting your health and/or quality of life? Patient declined to answer    Social Determinants of Health     Financial Resource Strain: Not on file   Internet Connectivity: Not on file   Food Insecurity: Not on file   Tobacco Use: Low Risk  (07/04/2022)    Received from [facility]    Patient History  Smoking Tobacco Use: Never     Smokeless Tobacco Use: Never     Passive Exposure: Never   Recent Concern: Tobacco Use - Medium Risk (05/19/2022)    Received from [facility]    Patient History     Smoking Tobacco Use: Former     Smokeless Tobacco Use: Never     Passive Exposure: Not on file   Housing/Utilities: Not on file   Alcohol Use: Unknown (09/29/2019)    Received from [facility]    AUDIT-C     Frequency of Alcohol Consumption: 2-3 times a week     Average Number of Drinks: Not on file     Frequency of Binge Drinking: Not on file   Transportation Needs: Not on file   Substance Use: Not on file   Health Literacy: Not on file   Physical Activity: Not on file   Interpersonal Safety: Not on file   Stress: Not on file   Intimate Partner Violence: Not on file   Depression: Not at risk (11/19/2021)    Received from [facility]    PHQ-2     Total Score =: 0   Social Connections: Not on file     Would you be willing to receive help with any of the needs that you have identified today? Not applicable       Kermit Balo, [facility]  [facility] Specialty Pharmacist

## 2022-08-20 NOTE — Unmapped (Signed)
I just spoke to him. He says that he actually feels fine and has not had any symptoms.    He plans to be here on 3/13 to begin infusion and he will start his take home medications on 3/10. He never read my MyChart message, so I have asked him to reply just stating that he received the message and understands.    He has already picked up his prednisone and allopurinol.    Thanks    [employer]

## 2022-09-01 NOTE — Unmapped (Signed)
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 male with a history of multiple cancers, who presents with progressive lymphocytosis due to his lymphoproliferative disorder and consultation for treatment recommendations.    #Biphenotypic B-cell lymphoproliferative disorder.  -He has 2 distinct abnormal monoclonal B-cell populations.  One is classic for CLL with CD5+ and dim CD20 and kappa restricted.  The other is more consistent with marginal zone lymphoma which is CD5-/CD10-/CD20+ and lambda restricted.  As all pathology has been equal distribution of both cell types, it is unclear which or if both are contributing equally to the progressive lymphocytosis.  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  -Due to rapidly progressive lymphocytosis and progressive lymphadenopathy and anemia, he meets criteria for treatment.  -Interestingly, his peripheral blood flow from 07/23/22 showed 60% of the CD5- population and 10% of the CD5+ population, while the bone marrow biopsy from 08/06/22 demonstrated 90% of the CD5+ (although both populations were detected by IGH sequencing)  -Previously discussed potential treatment options, but the best option that would be effective against both clones is the combination of obinutuzumab and acalabrutinib as given for frontline CLL therapy in the [facility] [place] study. Obinutuzumab would be given for 6 months and acalabrutinib would be given continuously.  -Potential side effects include but are not limited to infusion reaction, infection, neutropenia, diarrhea, nausea, heartburn, joint or muscle aches, bleeding, bruising, atrial fibrillation, hypertension, and headache.  -He presents to initiate therapy with obinutuzumab and acalabrutinib 09/03/22. ***    #Anemia.  -Likely related to B-cell malignancy as above.  -Also iron deficient.  Recommended increase iron supplementation to ferrous sulfate 325mg  every other day.  Recheck today 09/01/22 pending ***. If not responding to oral supplement would consider IV replacement.  He had a colonoscopy in 2022 and said that he had polyps and would have to return in 2027 for next one.  No overt bleeding at that time.    #Recent COVID19 infection.  ***    #Multiple medical comorbidities.  -Managed by PCP and other specialists.    #Skin excoriation left temple.  -Recommended dermatology evaluation as this is suspicious for SCC.  He has a Ship broker who he will contact.    #. Preventive  Cancer screening:  Patients with CLL/SLL are at increased risk of developing secondary malignancies.  Patient is not currently due for screening.  Annual dermatology evaluation, due to increased risk of non-melanomatous skin cancers.   If needed, blood products for transfusion should be irradiated.  Risk of Hypogammaglobulinemia: Baseline IgG levels 03/11/19 = 698, 07/23/22 1095  Vaccination  Avoid all live vaccines.  Recommend annual influenza vaccine.  Last given: 05/28/22  Recommend COVID vaccine series and booster.  Last given:  04/13/20  Recommend pneumococcal vaccine with Prevnar 20.  Will check if he has received this.  Recommend RSV vaccine.  Last given:  07/18/22  Recommend Zoster vaccine recombinant, adjuvanted (Shingrix).  Last given: 01/18/13 (chart lists as zostavax, will need to confirm)    -RTC ***    In total I spent *** minutes in this visit which consisted of collecting a history and making medical management decisions for the diagnoses listed in my note, as well as reviewing and updating the patient's medical record, coordinating care, and documenting before, during and after the visit.     Chilton Si, DO  Associate Professor of Medicine  Division of Hematology  [facility]  [place] [facility] [place] [facility] [place]    Chief Complaint:   No chief complaint on file.      Oncology/Treatment History:     Hematology/Oncology History   CLL (chronic lymphocytic leukemia) (CMS-HCC)   05/21/2021 Initial Diagnosis    CLL (chronic lymphocytic leukemia) (CMS-HCC)  -While he was officially diagnosed in 2022, he had a mild lymphocytosis dating back to 2017, when he was undergoing radiation for his prostate cancer.  On 02/08/2016, he had a flow cytometry, which showed a small population of monoclonal B-cells that were CD5-/CD10- with a total of 2.9k/uL clonal cells.  Observation was recommended.   -further evaluation of asymptomatic lymphocytosis ensued 05/21/2021.  -WBC 20.1, Hgb 12.5, Plt 238, ALC 13.8, ANC 3.96, B2M Not done, LDH 116 (ULN 192)  -Flow showed monoclonal B-cell population consistent with CLL with absolute clonal count of 3.74 k/UL, however a separate population of CD5-/CD10- monoclonal B-cells was detected  -Rai Stage: 1  -IGHV: Unmutated  -FISH showed: Normal  -Karyotype showed: Not completed.  -Recommendation: observation       07/01/2021 Interval Scan(s)    PET scan with scattered LAD, max size 2.0cm, No hepatosplenomegaly.     07/05/2021 Biopsy    Bone marrow biopsy showed hypercellular bone marrow with involvement of a low-grade B-cell lymphoproliferative disorder.  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 Ki-67 stain is variable in the interfollicular areas, 10% to 30%.   There are two separate B cell clones, in roughly equal proportions:   1) Kappa light chain restricted (dim expression), CD20+ (dim), CD5+, CD23+, CD43+, FMC-   2) Lambda light chain restricted, CD20+, CD5-, CD23+, CD43-, FMC+(dim).      06/26/2022 Interval Scan(s)    PET scan with continued LAD with mild progression.  Largest node 2.4cm in mesentery.     07/23/2022 Molecular Markers    -FISH showed: Normal  -Karyotype showed: 46,XY,del(7)(q31q32)[14]/46,XY[6]   -TP53 mutation was detected c.375G>A      08/05/2022 Biopsy    Bone marrow biopsy:  -  Hypercellular bone marrow (90%) primarily involved by CD5+ B-cell lymphoma/leukemia, consistent with chronic lymphocytic leukemia, representing approximately 70% of marrow cellularity by immunohistochemistry (see Comment)  -  Clonal immunoglobulin heavy (IGH) and kappa light chain (IGK) gene rearrangements identified (see Comment)            History Obtained From:   Patient and his significant other.    History of Present Illness:   BLU HEWATT is a 82 y.o. male with a history of multiple cancers, who presents with progressive lymphocytosis due to his lymphoproliferative disorder and consultation for treatment recommendations.  For detailed oncologic and treatment (if applicable) history, please see above.     Interim History   Mr. Nitcher reports that he feels relatively well.  He has mild non-drenching night sweats.  He has stable and chronic pain in back and bilateral knees.  He denies LAD, fevers, early satiety, weight loss, bleeding, bruising.     Review of Systems   Complete ROS performed and was negative except what is noted in interim history above.    Past Medical History     Past Medical History:   Diagnosis Date    Bladder cancer (CMS-HCC) 09/13/2021    CAD (coronary artery disease)     CLL (chronic lymphocytic leukemia) (CMS-HCC)     GERD (gastroesophageal reflux disease)     Hyperlipidemia     Hypertension     Marginal zone B-cell lymphoma (CMS-HCC)     Multinodular goiter     OSA (obstructive sleep apnea)     Prostate cancer (CMS-HCC) 10/2015    Stroke (CMS-HCC) 2011       Past Surgical History:     Past Surgical History:   Procedure Laterality Date    BIOPSY OF THYROID [facility] HISTORICAL RESULT)      BONE MARROW BIOPSY      CARPAL TUNNEL RELEASE      CATARACT EXTRACTION Bilateral     CORONARY ARTERY BYPASS GRAFT  2005    HEMORRHOID SURGERY  2002    LYMPH NODE BIOPSY  07/08/2021    PAROTIDECTOMY Left 08/04/2017    TONSILLECTOMY AND ADENOIDECTOMY      TOTAL KNEE ARTHROPLASTY Left 2014    TOTAL KNEE ARTHROPLASTY Right 05/17/2018    TRANSURETHRAL RESECTION OF BLADDER TUMOR  09/13/2021       Allergies:     Allergies   Allergen Reactions    Amoxicillin Hives, Nausea Only and Other (See Comments)     GI upset       Family History:     Family History   Problem Relation Age of Onset    Heart disease Mother     Parkinsonism Father     Alzheimer's disease Father     Diabetes Paternal Grandfather        Social History:        Medications:     Current Outpatient Medications   Medication Sig Dispense Refill    acalabrutinib (CALQUENCE) 100 mg tablet Take 1 tablet (100 mg total) by mouth two (2) times a day. Swallow whole with water. Do not to chew, crush, dissolve, or cut tablets. 60 tablet 11    acetaminophen (TYLENOL) 325 MG tablet Take 1.5 tablets (487.5 mg total) by mouth nightly. 100 tablet 2    allopurinol (ZYLOPRIM) 300 MG tablet Take 1 tablet (300 mg total) by mouth daily. 30 tablet 1    ascorbic acid, vitamin C, (VITAMIN C) 1000 MG tablet Take 1 tablet (1,000 mg total) by mouth.      aspirin (ECOTRIN) 81 MG tablet Take 1 tablet (81 mg total) by mouth.      atorvastatin (LIPITOR) 80 MG tablet       b complex vitamins (SUPER B-50 COMPLEX) capsule Take 1 capsule by mouth daily. 30 capsule 11    bimatoprost (LUMIGAN) 0.01 % Drop Administer 1 drop to both eyes nightly. 30 mL 0    carvedilol (COREG) 6.25 MG tablet Take 1 tablet (6.25 mg total) by mouth two (2) times a day.      cholecalciferol, vitamin D3-250 mcg, 10,000 unit,, 250 mcg (10,000 unit) capsule Take 0.5 capsules (125 mcg total) by mouth daily. 120 capsule 2    coenzyme Q10 200 mg capsule Take 1  capsule (200 mg total) by mouth.      cranberry extract 50 mg Chew Chew 50 mg/5 mL  in the morning. 1 tablet 0    cyanocobalamin, vitamin B-12, 1000 MCG tablet Take 0.5 tablets (500 mcg total) by mouth daily. 30 tablet 0    diphenhydrAMINE (BENADRYL) 25 mg capsule Take 1 capsule (25 mg total) by mouth nightly. 24 capsule 2    ENTRESTO 24-26 mg tablet Take 1 tablet by mouth.      ferrous sulfate 325 (65 FE) MG tablet Take 1 tablet (325 mg total) by mouth every other day. 30 tablet 11    fish,bora,flax oils-om3,6,9no1 (OMEGA 3-6-9) 1,200 mg cap Take 1 tablet by mouth in the morning. 30 capsule 0    L.acidophil-L.plantar-Bifido 7 15 billion cell cap Take 5 mg by mouth in the morning. 30 capsule 0    lysine 1,000 mg Tab Take 1,000 mg by mouth.      magnesium gluconate (MAGONATE) 500 mg (27 mg elem magnesium) tablet Take 1 tablet by mouth daily. 30 tablet 0    meloxicam (MOBIC) 7.5 MG tablet Take 1 tablet (7.5 mg total) by mouth daily. 30 tablet 2    mv-min-FA-vit K-lutein-zeaxant (PRESERVISION AREDS 2 PLUS MV) 200 mcg-15 mcg- 5 mg-1 mg cap Take 1 tablet by mouth in the morning. 30 capsule 0    selenium 200 mcg Tab Take 1 tablet (200 mcg total) by mouth.      tamsulosin (FLOMAX) 0.4 mg capsule       traMADol (ULTRAM) 50 mg tablet TAKE 1 TO 2 TABLETS BY MOUTH THREE TIMES DAILY AS NEEDED FOR PAIN      vitamin E mixed 400 unit cap Take 400 mg by mouth in the morning. 30 capsule 0    zinc gluconate 50 mg (7 mg elemental zinc) tablet Take 1 tablet (50 mg total) by mouth daily.       No current facility-administered medications for this visit.       Physical Exam:   PHYSICAL EXAMINATION:  ECOG: 2  VITAL SIGNS:  There were no vitals taken for this visit.  GENERAL:  The patient is in no acute distress. Significant other with him.  SKIN:  Left temple with excoriated lesion with erythematous base.  HEENT:  Atraumatic, normocephalic, flat features, suggestive of Parkinsons. Tympanic membranes are normal.  Conjunctivae and sclerae are clear. Extraocular movements are full. Pupils are equal, 3/3, round and reactive to light. Nasal and oral mucosa normal. Pharynx with no erythema, exudates, or drainage.  NECK:  Supple. No masses are noted. Trachea is midline.   LUNGS:  Clear to percussion and auscultation. No wheezes, crackles, or rales.  CARDIOVASCULAR:  Regular rate and rhythm.  Without murmurs or gallops.  ABDOMEN:  Normal bowel sounds. No masses. No tenderness.  No hepatomegaly.  No splenomegaly.  NEUROLOGICAL:  Alert and oriented. Cranial nerves II through XII are intact. Motor and sensory exams are normal. Motor and sensory reflexes are 2+ and symmetric.  LYMPH NODE:  Lymph Node Area Right (cm) Left (cm)   Cervical 1.5 x 1.5 1.5 x 1.5   Supraclavicular negative 1.5 x 2   Axillary 4 x 4 2 x 3   Inguinal 1.5 x 2 Negative         Labs:   See oncology history      Pathology:   See oncology history    Imaging:   I personally reviewed all of the patient's pertinent images and documented any  relevant findings in the assessment/plan.  See outside records.    No results found. drainage.  NECK:  Supple. No masses are noted. Trachea is midline.   LUNGS:  Clear to percussion and auscultation. No wheezes, crackles, or rales.  CARDIOVASCULAR:  Regular rate and rhythm.  Without murmurs or gallops.  ABDOMEN:  Normal bowel sounds. No masses. No tenderness.  No hepatomegaly.  No splenomegaly.  NEUROLOGICAL:  Alert and oriented. Cranial nerves II through XII are intact. Motor and sensory exams are normal. Motor and sensory reflexes are 2+ and symmetric.  LYMPH NODE:  Lymph Node Area Right (cm) Left (cm)   Cervical 1.5 x 1.5 1.5 x 1.5   Supraclavicular negative 1.5 x 2   Axillary 4 x 4 2 x 3   Inguinal 1.5 x 2 Negative         Labs:   See oncology history      Pathology:   See oncology history    Imaging:   I personally reviewed all of the patient's pertinent images and documented any relevant findings in the assessment/plan.  See outside records.    No results found.

## 2022-09-03 ENCOUNTER — Ambulatory Visit: Admit: 2022-09-03 | Discharge: 2022-09-04 | Payer: MEDICARE

## 2022-09-03 ENCOUNTER — Ambulatory Visit: Admit: 2022-09-03 | Discharge: 2022-09-04 | Payer: MEDICARE | Attending: Hematology | Primary: Hematology

## 2022-09-03 ENCOUNTER — Other Ambulatory Visit: Admit: 2022-09-03 | Discharge: 2022-09-04 | Payer: MEDICARE

## 2022-09-03 DIAGNOSIS — R1310 Dysphagia, unspecified: Principal | ICD-10-CM

## 2022-09-03 DIAGNOSIS — C9110 Chronic lymphocytic leukemia of B-cell type not having achieved remission: Principal | ICD-10-CM

## 2022-09-03 DIAGNOSIS — D509 Iron deficiency anemia, unspecified: Principal | ICD-10-CM

## 2022-09-03 DIAGNOSIS — R319 Hematuria, unspecified: Principal | ICD-10-CM

## 2022-09-03 DIAGNOSIS — D508 Other iron deficiency anemias: Principal | ICD-10-CM

## 2022-09-03 DIAGNOSIS — Z86010 Personal history of colonic polyps: Principal | ICD-10-CM

## 2022-09-03 DIAGNOSIS — Z5111 Encounter for antineoplastic chemotherapy: Principal | ICD-10-CM

## 2022-09-03 DIAGNOSIS — D630 Anemia in neoplastic disease: Principal | ICD-10-CM

## 2022-09-03 LAB — COMPREHENSIVE METABOLIC PANEL
ALBUMIN: 3.2 g/dL — ABNORMAL LOW (ref 3.4–5.0)
ALKALINE PHOSPHATASE: 119 U/L — ABNORMAL HIGH (ref 46–116)
ALT (SGPT): 29 U/L (ref 10–49)
ANION GAP: 5 mmol/L (ref 5–14)
AST (SGOT): 34 U/L (ref <=34)
BILIRUBIN TOTAL: 0.3 mg/dL (ref 0.3–1.2)
BLOOD UREA NITROGEN: 27 mg/dL — ABNORMAL HIGH (ref 9–23)
BUN / CREAT RATIO: 28
CALCIUM: 9.8 mg/dL (ref 8.7–10.4)
CHLORIDE: 112 mmol/L — ABNORMAL HIGH (ref 98–107)
CO2: 25 mmol/L (ref 20.0–31.0)
CREATININE: 0.96 mg/dL
EGFR CKD-EPI (2021) MALE: 79 mL/min/{1.73_m2} (ref >=60)
GLUCOSE RANDOM: 149 mg/dL (ref 70–179)
POTASSIUM: 4.2 mmol/L (ref 3.4–4.8)
PROTEIN TOTAL: 6.3 g/dL (ref 5.7–8.2)
SODIUM: 142 mmol/L (ref 135–145)

## 2022-09-03 LAB — URINALYSIS WITH MICROSCOPY WITH CULTURE REFLEX
BACTERIA: NONE SEEN /HPF
BILIRUBIN UA: NEGATIVE
BLOOD UA: NEGATIVE
GLUCOSE UA: NEGATIVE
KETONES UA: NEGATIVE
LEUKOCYTE ESTERASE UA: NEGATIVE
NITRITE UA: NEGATIVE
PH UA: 5 (ref 5.0–9.0)
PROTEIN UA: NEGATIVE
RBC UA: <1 /HPF (ref <=3)
SPECIFIC GRAVITY UA: 1.016 (ref 1.003–1.030)
SQUAMOUS EPITHELIAL: <1 /HPF (ref 0–5)
UROBILINOGEN UA: <2
WBC UA: 1 /HPF (ref <=2)

## 2022-09-03 LAB — LACTATE DEHYDROGENASE: LACTATE DEHYDROGENASE: 183 U/L (ref 120–246)

## 2022-09-03 LAB — CBC W/ AUTO DIFF
BASOPHILS ABSOLUTE COUNT: 0.1 10*9/L (ref 0.0–0.1)
BASOPHILS RELATIVE PERCENT: 0.1 %
EOSINOPHILS ABSOLUTE COUNT: 0.1 10*9/L (ref 0.0–0.5)
EOSINOPHILS RELATIVE PERCENT: 0.1 %
HEMATOCRIT: 32.1 % — ABNORMAL LOW (ref 39.0–48.0)
HEMOGLOBIN: 10 g/dL — ABNORMAL LOW (ref 12.9–16.5)
LYMPHOCYTES ABSOLUTE COUNT: 100.1 10*9/L — ABNORMAL HIGH (ref 1.1–3.6)
LYMPHOCYTES RELATIVE PERCENT: 90.9 %
MEAN CORPUSCULAR HEMOGLOBIN CONC: 31.3 g/dL — ABNORMAL LOW (ref 32.0–36.0)
MEAN CORPUSCULAR HEMOGLOBIN: 28.6 pg (ref 25.9–32.4)
MEAN CORPUSCULAR VOLUME: 91.5 fL (ref 77.6–95.7)
MEAN PLATELET VOLUME: 7.8 fL (ref 6.8–10.7)
MONOCYTES ABSOLUTE COUNT: 1.1 10*9/L — ABNORMAL HIGH (ref 0.3–0.8)
MONOCYTES RELATIVE PERCENT: 1 %
NEUTROPHILS ABSOLUTE COUNT: 8.7 10*9/L — ABNORMAL HIGH (ref 1.8–7.8)
NEUTROPHILS RELATIVE PERCENT: 7.9 %
PLATELET COUNT: 210 10*9/L (ref 150–450)
RED BLOOD CELL COUNT: 3.51 10*12/L — ABNORMAL LOW (ref 4.26–5.60)
RED CELL DISTRIBUTION WIDTH: 15.4 % — ABNORMAL HIGH (ref 12.2–15.2)
WBC ADJUSTED: 110.1 10*9/L (ref 3.6–11.2)

## 2022-09-03 LAB — URIC ACID: URIC ACID: 5.7 mg/dL

## 2022-09-03 LAB — IRON & TIBC
IRON SATURATION: 14 % — ABNORMAL LOW (ref 20–55)
IRON: 40 ug/dL — ABNORMAL LOW
TOTAL IRON BINDING CAPACITY: 288 ug/dL (ref 250–425)

## 2022-09-03 LAB — SLIDE REVIEW

## 2022-09-03 LAB — FERRITIN: FERRITIN: 36.8 ng/mL

## 2022-09-03 LAB — PHOSPHORUS: PHOSPHORUS: 2.3 mg/dL — ABNORMAL LOW (ref 2.4–5.1)

## 2022-09-03 LAB — MAGNESIUM: MAGNESIUM: 2 mg/dL (ref 1.6–2.6)

## 2022-09-03 LAB — PERIPHERAL BLOOD SMEAR, PATH REVIEW

## 2022-09-03 MED ORDER — ONDANSETRON HCL 4 MG TABLET
ORAL_TABLET | 0 refills | 0 days | Status: CP
Start: 2022-09-03 — End: ?
  Filled 2022-09-04: qty 30, 30d supply, fill #0

## 2022-09-03 MED ORDER — VALACYCLOVIR 500 MG TABLET
ORAL_TABLET | Freq: Every day | ORAL | 5 refills | 30 days | Status: CP
Start: 2022-09-03 — End: ?
  Filled 2022-09-04: qty 30, 30d supply, fill #0

## 2022-09-03 MED ADMIN — sodium chloride (NS) 0.9 % infusion: 100 mL/h | INTRAVENOUS | @ 16:00:00

## 2022-09-03 MED ADMIN — methylPREDNISolone sodium succinate (PF) (SOLU-Medrol) injection 125 mg: 125 mg | INTRAVENOUS | @ 18:00:00

## 2022-09-03 MED ADMIN — diphenhydrAMINE (BENADRYL) injection 25 mg: 25 mg | INTRAVENOUS | @ 18:00:00

## 2022-09-03 MED ADMIN — famotidine (PF) (PEPCID) injection 20 mg: 20 mg | INTRAVENOUS | @ 18:00:00

## 2022-09-03 MED ADMIN — diphenhydrAMINE (BENADRYL) injection 50 mg: 50 mg | INTRAVENOUS | @ 16:00:00

## 2022-09-03 MED ADMIN — ondansetron (ZOFRAN) injection 4 mg: 4 mg | INTRAVENOUS | @ 18:00:00 | Stop: 2022-09-03

## 2022-09-03 MED ADMIN — obinutuzumab 100 mg in sodium chloride (NS) 0.9 % 100 mL IVPB: 100 mg | INTRAVENOUS | @ 17:00:00 | Stop: 2022-09-03

## 2022-09-03 MED ADMIN — dexAMETHasone (DECADRON) 4 mg/mL injection 20 mg: 20 mg | INTRAVENOUS | @ 16:00:00 | Stop: 2022-09-03

## 2022-09-03 MED ADMIN — sodium chloride 0.9% (NS) bolus 1,000 mL: 1000 mL | INTRAVENOUS | @ 18:00:00

## 2022-09-03 MED ADMIN — acetaminophen (TYLENOL) tablet 650 mg: 650 mg | ORAL | @ 16:00:00 | Stop: 2022-09-03

## 2022-09-03 NOTE — Unmapped (Signed)
Infusion Progress Note    Patient Name: Brendan Bishop  Patient Age: 82 y.o.  Encounter Date: 09/03/2022  Allergies   Allergen Reactions    Amoxicillin Hives, Nausea Only and Other (See Comments)     GI upset       Reason for visit  Infusion Center visit   Today is cycle 1/ day 1 of therapy.   Chief Complaint: hypersensitivity reaction    I spent at least 15 minutes with this patient: assessing, performing physical exam with > 50% of time spent in counseling.     Assessment/Plan:  Hypersensitivity reaction Grade 2   -premedications: decadron 20 mg IV, tylenol 650mg  PO, benadryl 50 mg PO  -symptoms: chest tightness, chest heaviness, slight flushing  -amount infused: 11.5 mL   -reaction medications: benadryl 25 mg IV, pepcid 20 mg IV, solu-medrol 125mg  IV  -disposition: OK to restart infusion 30 min after symptoms resolve at half rate    I have discussed the case (exam, laboratory results, and plan) with Dr. Zonia Kief, Ronnald Collum CPP and nursing staff     Interval History/HPI:   Brendan Bishop is a 82 y.o. male patient with history of multiple cancers, who presents with progressive lymphocytosis due to his lymphoproliferative disorder, CLL. He is in oncology clinic for D1C1 Obinutuzumab and after approximately 11.5 mL, he developed acute chest tightness/chest heaviness and mild flushing. The infusion was immediately paused and emergency medications were given as above with good response. His VS remained stable throughout. He was rechallenged after 30 min at half rate, and tolerated, therefore was increased to prescribed infusion rate per therapy plan.     Review of Systems:  A complete review of systems was obtained including: Constitutional, Eyes, ENT, Cardiovascular, Respiratory, GI, GU, Musculoskeletal, Skin, Neurological, Psychiatric, Endocrine, Heme/Lymphatic, and Allergic/Immunologic systems. It is negative or non-contributory to the patient???s management except for positives mentioned in HPI. Physical Exam:  Vital Signs:  Vitals:    09/03/22 1144 09/03/22 1330 09/03/22 1334 09/03/22 1342   BP: 118/61 110/62 124/73 109/65   Pulse: 86 95 100 96   Resp: 16      Temp: 36.5 ??C (97.7 ??F)  36.4 ??C (97.6 ??F)    TempSrc: Oral  Oral    SpO2:   94% 94%       General:               resting comfortably, in no acute distress, well appearing  HEENT:               sclerae non-icteric, conjunctiva clear, normal voice, vision intact, hearing intact  Cardiovascular:   regular rate and rhythm. no murmurs, rubs, or gallops. warm and well-perfused. radial pulses 2+ bilaterally.  Respiratory:         clear to auscultation bilaterally no wheezes, rhonchi, or rales no accessory muscle use  Gastrointestinal:  soft, non-distended, and non-tender to palpation  Musculoskeletal:  no bony pain or tenderness  Extremities:          distal pulses are full and symmetric, no clubbing or cyanosis  Skin:                    warm, dry, and no rashes, lesions or breakdown  Neuro:                 alert & oriented x 3, normal speech    Results:  Office Visit on 09/03/2022   Component Date Value Ref Range Status  Color, UA 09/03/2022 Light Yellow   Final    Clarity, UA 09/03/2022 Clear   Final    Specific Gravity, UA 09/03/2022 1.016  1.003 - 1.030 Final    pH, UA 09/03/2022 5.0  5.0 - 9.0 Final    Leukocyte Esterase, UA 09/03/2022 Negative  Negative Final    Nitrite, UA 09/03/2022 Negative  Negative Final    Protein, UA 09/03/2022 Negative  Negative Final    Glucose, UA 09/03/2022 Negative  Negative Final    Ketones, UA 09/03/2022 Negative  Negative Final    Urobilinogen, UA 09/03/2022 <2.0 mg/dL  <1.6 mg/dL Final    Bilirubin, UA 09/03/2022 Negative  Negative Final    Blood, UA 09/03/2022 Negative  Negative Final    RBC, UA 09/03/2022 <1  <=3 /HPF Final    WBC, UA 09/03/2022 1  <=2 /HPF Final    Squam Epithel, UA 09/03/2022 <1  0 - 5 /HPF Final    Bacteria, UA 09/03/2022 None Seen  None Seen /HPF Final    Mucus, UA 09/03/2022 Rare (A) None Seen /HPF Final   Lab on 09/03/2022   Component Date Value Ref Range Status    Iron 09/03/2022 40 (L)  65 - 175 ug/dL Final    TIBC 10/96/0454 288  250 - 425 ug/dL Final    Iron Saturation (%) 09/03/2022 14 (L)  20 - 55 % Final    Ferritin 09/03/2022 36.8  10.5 - 307.3 ng/mL Final    Sodium 09/03/2022 142  135 - 145 mmol/L Final    Potassium 09/03/2022 4.2  3.4 - 4.8 mmol/L Final    Chloride 09/03/2022 112 (H)  98 - 107 mmol/L Final    CO2 09/03/2022 25.0  20.0 - 31.0 mmol/L Final    Anion Gap 09/03/2022 5  5 - 14 mmol/L Final    BUN 09/03/2022 27 (H)  9 - 23 mg/dL Final    Creatinine 09/81/1914 0.96  0.73 - 1.18 mg/dL Final    BUN/Creatinine Ratio 09/03/2022 28   Final    eGFR CKD-EPI (2021) Male 09/03/2022 79  >=60 mL/min/1.75m2 Final    eGFR calculated with CKD-EPI 2021 equation in accordance with [employer] and [employer] of Nephrology Task Force recommendations.    Glucose 09/03/2022 149  70 - 179 mg/dL Final    Calcium 78/29/5621 9.8  8.7 - 10.4 mg/dL Final    Albumin 30/86/5784 3.2 (L)  3.4 - 5.0 g/dL Final    Total Protein 09/03/2022 6.3  5.7 - 8.2 g/dL Final    Total Bilirubin 09/03/2022 0.3  0.3 - 1.2 mg/dL Final    AST 69/62/9528 34  <=34 U/L Final    ALT 09/03/2022 29  10 - 49 U/L Final    Alkaline Phosphatase 09/03/2022 119 (H)  46 - 116 U/L Final    Magnesium 09/03/2022 2.0  1.6 - 2.6 mg/dL Final    Phosphorus 41/32/4401 2.3 (L)  2.4 - 5.1 mg/dL Final    LDH 02/72/5366 183  120 - 246 U/L Final    Uric Acid 09/03/2022 5.7  3.7 - 9.2 mg/dL Final    WBC 44/08/4740 110.1 (HH)  3.6 - 11.2 10*9/L Final    RBC 09/03/2022 3.51 (L)  4.26 - 5.60 10*12/L Final    HGB 09/03/2022 10.0 (L)  12.9 - 16.5 g/dL Final    HCT 59/56/3875 32.1 (L)  39.0 - 48.0 % Final    MCV 09/03/2022 91.5  77.6 - 95.7 fL Final  MCH 09/03/2022 28.6  25.9 - 32.4 pg Final    MCHC 09/03/2022 31.3 (L)  32.0 - 36.0 g/dL Final    RDW 09/81/1914 15.4 (H)  12.2 - 15.2 % Final    MPV 09/03/2022 7.8  6.8 - 10.7 fL Final Platelet 09/03/2022 210  150 - 450 10*9/L Final    Neutrophils % 09/03/2022 7.9  % Final    Lymphocytes % 09/03/2022 90.9  % Final    Monocytes % 09/03/2022 1.0  % Final    Eosinophils % 09/03/2022 0.1  % Final    Basophils % 09/03/2022 0.1  % Final    Absolute Neutrophils 09/03/2022 8.7 (H)  1.8 - 7.8 10*9/L Final    Absolute Lymphocytes 09/03/2022 100.1 (H)  1.1 - 3.6 10*9/L Final    Absolute Monocytes 09/03/2022 1.1 (H)  0.3 - 0.8 10*9/L Final    Absolute Eosinophils 09/03/2022 0.1  0.0 - 0.5 10*9/L Final    Absolute Basophils 09/03/2022 0.1  0.0 - 0.1 10*9/L Final    Smear Review Comments 09/03/2022 See Comment (A)  Undefined Final    Abnormal lymphoid cells present.  Smudge Cells present.  [id number]; albumin: [id number]    Pathologist Smear Interpretation 09/03/2022 Confirmed by Hemepath Specialist/Senior Tech  Confirmed by Hemepath Fellow, Confirmed by Hemepath Specialist/Senior Tech, Confirmed by Core Specialist/Senior Tech, To Be Accessioned - Case Report to Follow, Physician Requested Path Review - BF/CSF: cytospin routed to hemepath, Physician Requested... Final       Erlinda Hong, PA

## 2022-09-03 NOTE — Unmapped (Addendum)
Mr.Macaraeg is a 82 y.o. male with CLL who I am seeing in clinic today for oral chemotherapy education    Encounter Date: 09/03/2022    Current Treatment: obinutuzumab/acalabrutinib (obi C1D1 today, acalabrutinib to start 09/06/22)    For oral chemotherapy:  Pharmacy: [facility]   Medication Access: $0 copay with grant    Interval History: I spoke to Mr. Gavin today for start of acalabrutinib/obinutuzumab. We reviewed medication list in detail including supplements and reviewed which ones I would recommend stopping at this point. We reviewed dosing/administration, adverse effects, and monitoring for start of both obinutuzumab and acalabrutinib, including infusion-related reactions, complications of myelosuppression (bleeding, infection, fevers, fatigue), diarrhea, headaches, arthralgias/myalgias, fatigue, bruising, rash/skin changes, nausea. Also discussed risk of bleeding and atrial fibrillation.     Labs: WBC 110.1, ANC 8.7, ALC 110.1, Hgb 10.0, PLT 210; CMP notable for phos 2.3, TLS labs wnl, iron 40, iron sat 14%  Vitals: BP 116/65; HR 97    Oncologic History:  Hematology/Oncology History   CLL (chronic lymphocytic leukemia) (CMS-HCC)   05/21/2021 Initial Diagnosis    CLL (chronic lymphocytic leukemia) (CMS-HCC)  -While he was officially diagnosed in 2022, he had a mild lymphocytosis dating back to 2017, when he was undergoing radiation for his prostate cancer.  On 02/08/2016, he had a flow cytometry, which showed a small population of monoclonal B-cells that were CD5-/CD10- with a total of 2.9k/uL clonal cells.  Observation was recommended.   -further evaluation of asymptomatic lymphocytosis ensued 05/21/2021.  -WBC 20.1, Hgb 12.5, Plt 238, ALC 13.8, ANC 3.96, B2M Not done, LDH 116 (ULN 192)  -Flow showed monoclonal B-cell population consistent with CLL with absolute clonal count of 3.74 k/UL, however a separate population of CD5-/CD10- monoclonal B-cells was detected  -Rai Stage: 1  -IGHV: Unmutated  -FISH showed: Normal  -Karyotype showed: Not completed.  -Recommendation: observation       07/01/2021 Interval Scan(s)    PET scan with scattered LAD, max size 2.0cm, No hepatosplenomegaly.     07/05/2021 Biopsy    Bone marrow biopsy showed hypercellular bone marrow with involvement of a low-grade B-cell lymphoproliferative disorder.  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 Ki-67 stain is variable in the interfollicular areas, 10% to 30%.   There are two separate B cell clones, in roughly equal proportions:   1) Kappa light chain restricted (dim expression), CD20+ (dim), CD5+, CD23+, CD43+, FMC-   2) Lambda light chain restricted, CD20+, CD5-, CD23+, CD43-, FMC+(dim).      06/26/2022 Interval Scan(s)    PET scan with continued LAD with mild progression.  Largest node 2.4cm in mesentery.     07/23/2022 Molecular Markers    -FISH showed: Normal  -Karyotype showed: 46,XY,del(7)(q31q32)[14]/46,XY[6]   -TP53 mutation was detected c.375G>A      08/05/2022 Biopsy    Bone marrow biopsy:  -  Hypercellular bone marrow (90%) primarily involved by CD5+ B-cell lymphoma/leukemia, consistent with chronic lymphocytic leukemia, representing approximately 70% of marrow cellularity by immunohistochemistry (see Comment)  -  Clonal immunoglobulin heavy (IGH) and kappa light chain (IGK) gene rearrangements identified (see Comment)          Weight and Vitals:  Wt Readings from Last 3 Encounters:   09/03/22 78.7 kg (173 lb 8 oz)   07/23/22 78.4 kg (172 lb 13.5 oz)     Temp Readings from Last 3  Encounters:   09/03/22 36.4 ??C (97.6 ??F) (Temporal)   08/05/22 36.4 ??C (97.5 ??F) (Oral)   07/23/22 36.6 ??C (97.8 ??F) (Temporal)     BP Readings from Last 3 Encounters:   09/03/22 95/63   08/05/22 122/68   07/23/22 92/59     Pulse Readings from Last 3 Encounters:   09/03/22 85   08/05/22 77   07/23/22 78       Pertinent Labs:  Office Visit on 09/03/2022   Component Date Value Ref Range Status    Color, UA 09/03/2022 Light Yellow   Final    Clarity, UA 09/03/2022 Clear   Final    Specific Gravity, UA 09/03/2022 1.016  1.003 - 1.030 Final    pH, UA 09/03/2022 5.0  5.0 - 9.0 Final    Leukocyte Esterase, UA 09/03/2022 Negative  Negative Final    Nitrite, UA 09/03/2022 Negative  Negative Final    Protein, UA 09/03/2022 Negative  Negative Final    Glucose, UA 09/03/2022 Negative  Negative Final    Ketones, UA 09/03/2022 Negative  Negative Final    Urobilinogen, UA 09/03/2022 <2.0 mg/dL  <1.6 mg/dL Final    Bilirubin, UA 09/03/2022 Negative  Negative Final    Blood, UA 09/03/2022 Negative  Negative Final    RBC, UA 09/03/2022 <1  <=3 /HPF Final    WBC, UA 09/03/2022 1  <=2 /HPF Final    Squam Epithel, UA 09/03/2022 <1  0 - 5 /HPF Final    Bacteria, UA 09/03/2022 None Seen  None Seen /HPF Final    Mucus, UA 09/03/2022 Rare (A)  None Seen /HPF Final   Lab on 09/03/2022   Component Date Value Ref Range Status    Iron 09/03/2022 40 (L)  65 - 175 ug/dL Final    TIBC 10/96/0454 288  250 - 425 ug/dL Final    Iron Saturation (%) 09/03/2022 14 (L)  20 - 55 % Final    Ferritin 09/03/2022 36.8  10.5 - 307.3 ng/mL Final    Sodium 09/03/2022 142  135 - 145 mmol/L Final    Potassium 09/03/2022 4.2  3.4 - 4.8 mmol/L Final    Chloride 09/03/2022 112 (H)  98 - 107 mmol/L Final    CO2 09/03/2022 25.0  20.0 - 31.0 mmol/L Final    Anion Gap 09/03/2022 5  5 - 14 mmol/L Final    BUN 09/03/2022 27 (H)  9 - 23 mg/dL Final    Creatinine 09/81/1914 0.96  0.73 - 1.18 mg/dL Final    BUN/Creatinine Ratio 09/03/2022 28   Final    eGFR CKD-EPI (2021) Male 09/03/2022 79  >=60 mL/min/1.53m2 Final    eGFR calculated with CKD-EPI 2021 equation in accordance with [employer] and [employer] of Nephrology Task Force recommendations.    Glucose 09/03/2022 149  70 - 179 mg/dL Final    Calcium 78/29/5621 9.8  8.7 - 10.4 mg/dL Final    Albumin 30/86/5784 3.2 (L)  3.4 - 5.0 g/dL Final    Total Protein 09/03/2022 6.3  5.7 - 8.2 g/dL Final    Total Bilirubin 09/03/2022 0.3  0.3 - 1.2 mg/dL Final    AST 69/62/9528 34  <=34 U/L Final    ALT 09/03/2022 29  10 - 49 U/L Final    Alkaline Phosphatase 09/03/2022 119 (H)  46 - 116 U/L Final    Magnesium 09/03/2022 2.0  1.6 - 2.6 mg/dL Final    Phosphorus 41/32/4401 2.3 (L)  2.4 -  5.1 mg/dL Final    LDH 81/19/1478 183  120 - 246 U/L Final    Uric Acid 09/03/2022 5.7  3.7 - 9.2 mg/dL Final    WBC 29/56/2130 110.1 (HH)  3.6 - 11.2 10*9/L Final    RBC 09/03/2022 3.51 (L)  4.26 - 5.60 10*12/L Final    HGB 09/03/2022 10.0 (L)  12.9 - 16.5 g/dL Final    HCT 86/57/8469 32.1 (L)  39.0 - 48.0 % Final    MCV 09/03/2022 91.5  77.6 - 95.7 fL Final    MCH 09/03/2022 28.6  25.9 - 32.4 pg Final    MCHC 09/03/2022 31.3 (L)  32.0 - 36.0 g/dL Final    RDW 62/95/2841 15.4 (H)  12.2 - 15.2 % Final    MPV 09/03/2022 7.8  6.8 - 10.7 fL Final    Platelet 09/03/2022 210  150 - 450 10*9/L Final    Neutrophils % 09/03/2022 7.9  % Final    Lymphocytes % 09/03/2022 90.9  % Final    Monocytes % 09/03/2022 1.0  % Final    Eosinophils % 09/03/2022 0.1  % Final    Basophils % 09/03/2022 0.1  % Final    Absolute Neutrophils 09/03/2022 8.7 (H)  1.8 - 7.8 10*9/L Final    Absolute Lymphocytes 09/03/2022 100.1 (H)  1.1 - 3.6 10*9/L Final    Absolute Monocytes 09/03/2022 1.1 (H)  0.3 - 0.8 10*9/L Final    Absolute Eosinophils 09/03/2022 0.1  0.0 - 0.5 10*9/L Final    Absolute Basophils 09/03/2022 0.1  0.0 - 0.1 10*9/L Final    Smear Review Comments 09/03/2022 See Comment (A)  Undefined Final    Abnormal lymphoid cells present.  Smudge Cells present.  [id number]; albumin: [id number]    Pathologist Smear Interpretation 09/03/2022 Confirmed by Hemepath Specialist/Senior Tech  Confirmed by Hemepath Fellow, Confirmed by Hemepath Specialist/Senior Tech, Confirmed by Core Specialist/Senior Tech, To Be Accessioned - Case Report to Follow, Physician Requested Path Review - BF/CSF: cytospin routed to hemepath, Physician Requested... Final       Allergies:   Allergies   Allergen Reactions    Amoxicillin Hives, Nausea Only and Other (See Comments)     GI upset       Drug Interactions: avoid strong CYP3A4 inh/ind; instructed patient to stop vitamin E, cranberry, and Reishi mushroom due to potential interactions/increased risk of bleeding      Current Medications:  Current Outpatient Medications   Medication Sig Dispense Refill    acalabrutinib (CALQUENCE) 100 mg tablet Take 1 tablet (100 mg total) by mouth two (2) times a day. Swallow whole with water. Do not to chew, crush, dissolve, or cut tablets. 60 tablet 11    acetaminophen (TYLENOL) 325 MG tablet Take 1.5 tablets (487.5 mg total) by mouth nightly. 100 tablet 2    allopurinol (ZYLOPRIM) 300 MG tablet Take 1 tablet (300 mg total) by mouth daily. 30 tablet 1    ascorbic acid, vitamin C, (VITAMIN C) 1000 MG tablet Take 1 tablet (1,000 mg total) by mouth.      aspirin (ECOTRIN) 81 MG tablet Take 1 tablet (81 mg total) by mouth.      atorvastatin (LIPITOR) 80 MG tablet       b complex vitamins (SUPER B-50 COMPLEX) capsule Take 1 capsule by mouth daily. 30 capsule 11    bimatoprost (LUMIGAN) 0.01 % Drop Administer 1 drop to both eyes nightly. 30 mL 0    cholecalciferol, vitamin D3-250 mcg, 10,000 unit,, 250  mcg (10,000 unit) capsule Take 0.5 capsules (125 mcg total) by mouth daily. 120 capsule 2    coenzyme Q10 200 mg capsule Take 1 capsule (200 mg total) by mouth.      ENTRESTO 24-26 mg tablet Take 1 tablet by mouth.      fish,bora,flax oils-om3,6,9no1 (OMEGA 3-6-9) 1,200 mg cap Take 1 tablet by mouth in the morning. 30 capsule 0    L.acidophil-L.plantar-Bifido 7 15 billion cell cap Take 5 mg by mouth in the morning. 30 capsule 0    magnesium gluconate (MAGONATE) 500 mg (27 mg elem magnesium) tablet Take 1 tablet by mouth daily. 30 tablet 0    mv-min-FA-vit K-lutein-zeaxant (PRESERVISION AREDS 2 PLUS MV) 200 mcg-15 mcg- 5 mg-1 mg cap Take 1 tablet by mouth in the morning. 30 capsule 0    selenium 200 mcg Tab Take 1 tablet (200 mcg total) by mouth.      tamsulosin (FLOMAX) 0.4 mg capsule       traMADol (ULTRAM) 50 mg tablet TAKE 1 TO 2 TABLETS BY MOUTH THREE TIMES DAILY AS NEEDED FOR PAIN      zinc gluconate 50 mg (7 mg elemental zinc) tablet Take 1 tablet (50 mg total) by mouth daily.      cyanocobalamin, vitamin B-12, 1000 MCG tablet Take 0.5 tablets (500 mcg total) by mouth daily. 30 tablet 0    ferrous sulfate 325 (65 FE) MG tablet Take 1 tablet (325 mg total) by mouth every other day. 30 tablet 11    lysine 1,000 mg Tab Take 1,000 mg by mouth.      midodrine (PROAMATINE) 5 MG tablet Take 1 tablet (5 mg total) by mouth Three (3) times a day.      NON FORMULARY Astralagus, Black elderberry, reishii      ondansetron (ZOFRAN) 4 MG tablet Take 1 tablet (4 mg) once a day as needed for nausea. 30 tablet 0    spironolactone (ALDACTONE) 25 MG tablet Take 1 tablet (25 mg total) by mouth daily.      valACYclovir (VALTREX) 500 MG tablet Take 1 tablet (500 mg total) by mouth daily. 30 tablet 5     No current facility-administered medications for this visit.       Adherence: no barriers identified        Assessment: Mr.Fodor is a 81 y.o. male with IGHV un-mutated, TP53 mutated CLL planning to start treatment with acalabrutinib + obinutuzumab per [facility]. Reviewed education for acala/obi and patient confirmed understanding of plan below.     Plan:   1) CLL  -C1D1 obinutuzumab today (100 mg today, 900 mg tomorrow, then 1000 mg C1D8, C1D15, and day 1 of each cycle thereafter, with a total of 6 cycles)  -Start acalabrutinib 100 mg PO BID - will start this weekend after start of obinutuzumab and prefers to start after work week  -CPP phone call in 2 weeks for monitoring, RTC in 4 weeks prior to C2 obi start    2) TLS prophylaxis  -Continue allopurinol 300 mg daily    3) Infection prophylaxis  -Start valacyclovir 500 mg daily    4) Supportive care  -Start ondansetron 4 mg daily prn nausea    5) Iron deficiency anemia  -Start ferumoxytol (Feraheme) 510 mg x 2 - will set up infusions at [facility] per patient preference     F/u:  Future Appointments   Date Time Provider Department Center   09/03/2022 10:35 AM ONCINF CHAIR 45 [id number]  09/04/2022 12:30 PM ONCINF CHAIR 18 [id number]   09/10/2022  8:00 AM ONCINF CHAIR 20 [id number]   09/17/2022  1:00 PM ONCINF CHAIR 18 [id number]   10/01/2022  8:15 AM ADULT ONC LAB [facility]   10/01/2022  9:00 AM Silvio Pate, DO [id number]       I spent 60 minutes with Mr.Mcwright in direct patient care.    Ronnald Collum, PharmD, BCOP, CPP  Hematology/Oncology Clinical Pharmacist  Pager [phone]

## 2022-09-03 NOTE — Unmapped (Signed)
12:15 Pt ambulated with stable gait to treatment area after seen and evaluated by provider for C1D1 Obinutuzumab. Pt states feeling well and offers no complaints at this time.  Pt with PIV to LFA PTA, flushed with good BR and flushed. Pt labs reviewed, within parameters to treat. Request for drug sent to pharmacy. Pt aware and in agreement with plan. Pt pre-medicated per order.      13:28 - Pt c/o chest tightness after 11.35ml of gazyva infused - infusion paused and  D. Camastra PA to bedside    13:35 - pt medicated with Solu-Medrol 125mg  IV, Pepcid 20mg  IV and Benadryl 25mg  IV    13:40 - pt medicated per order with Zofran 4mg  IV for c/o nausea    13:48 - pt states full resolution of Sx - denies any CP or nausea and VSS - OK to restart Gazyva at half rate (12.5mg /hr) for 30 min before increasing to ordered rate of 25mg /hr if tolerated well.    18:00 - Pt tolerated treatment well with no adverse reaction noted with VSS and slept during majority of infusion. Port with good BR at completion. Pt PIV flushed and removed per protocol per pt preference - pt aware he will need new PIV placed for labs and for D2 treatment. Pt given copy of AVS and aware to return tmrw by 11am latest for D2 Gazyva. Pt ambulated from treatment area with stable gait accompanied by family member.

## 2022-09-03 NOTE — Unmapped (Incomplete Revision)
Brendan Bishop is a 82 y.o. male with CLL who I am seeing in clinic today for oral chemotherapy education    Encounter Date: 09/03/2022    Current Treatment: obinutuzumab/acalabrutinib (obi C1D1 today, acalabrutinib to start 09/06/22)    For oral chemotherapy:  Pharmacy: [facility]   Medication Access: $0 copay with grant    Interval History: I spoke to Brendan Bishop today for start of acalabrutinib/obinutuzumab. We reviewed medication list in detail including supplements and reviewed which ones I would recommend stopping at this point. We reviewed dosing/administration, adverse effects, and monitoring for start of both obinutuzumab and acalabrutinib, including infusion-related reactions, complications of myelosuppression (bleeding, infection, fevers, fatigue), diarrhea, headaches, arthralgias/myalgias, fatigue, bruising, rash/skin changes, nausea. Also discussed risk of bleeding and atrial fibrillation.     Labs: WBC 110.1, ANC 8.7, ALC 110.1, Hgb 10.0, PLT 210; CMP notable for phos 2.3, TLS labs wnl, iron 40, iron sat 14%  Vitals: BP 116/65; HR 97    Oncologic History:  Hematology/Oncology History   CLL (chronic lymphocytic leukemia) (CMS-HCC)   05/21/2021 Initial Diagnosis    CLL (chronic lymphocytic leukemia) (CMS-HCC)  -While he was officially diagnosed in 2022, he had a mild lymphocytosis dating back to 2017, when he was undergoing radiation for his prostate cancer.  On 02/08/2016, he had a flow cytometry, which showed a small population of monoclonal B-cells that were CD5-/CD10- with a total of 2.9k/uL clonal cells.  Observation was recommended.   -further evaluation of asymptomatic lymphocytosis ensued 05/21/2021.  -WBC 20.1, Hgb 12.5, Plt 238, ALC 13.8, ANC 3.96, B2M Not done, LDH 116 (ULN 192)  -Flow showed monoclonal B-cell population consistent with CLL with absolute clonal count of 3.74 k/UL, however a separate population of CD5-/CD10- monoclonal B-cells was detected  -Rai Stage: 1  -IGHV: Unmutated  -FISH showed: Normal  -Karyotype showed: Not completed.  -Recommendation: observation       07/01/2021 Interval Scan(s)    PET scan with scattered LAD, max size 2.0cm, No hepatosplenomegaly.     07/05/2021 Biopsy    Bone marrow biopsy showed hypercellular bone marrow with involvement of a low-grade B-cell lymphoproliferative disorder.  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 Ki-67 stain is variable in the interfollicular areas, 10% to 30%.   There are two separate B cell clones, in roughly equal proportions:   1) Kappa light chain restricted (dim expression), CD20+ (dim), CD5+, CD23+, CD43+, FMC-   2) Lambda light chain restricted, CD20+, CD5-, CD23+, CD43-, FMC+(dim).      06/26/2022 Interval Scan(s)    PET scan with continued LAD with mild progression.  Largest node 2.4cm in mesentery.     07/23/2022 Molecular Markers    -FISH showed: Normal  -Karyotype showed: 46,XY,del(7)(q31q32)[14]/46,XY[6]   -TP53 mutation was detected c.375G>A      08/05/2022 Biopsy    Bone marrow biopsy:  -  Hypercellular bone marrow (90%) primarily involved by CD5+ B-cell lymphoma/leukemia, consistent with chronic lymphocytic leukemia, representing approximately 70% of marrow cellularity by immunohistochemistry (see Comment)  -  Clonal immunoglobulin heavy (IGH) and kappa light chain (IGK) gene rearrangements identified (see Comment)          Weight and Vitals:  Wt Readings from Last 3 Encounters:   09/03/22 78.7 kg (173 lb 8 oz)   07/23/22 78.4 kg (172 lb 13.5 oz)     Temp Readings from Last 3  Encounters:   09/03/22 36.4 ??C (97.6 ??F) (Temporal)   08/05/22 36.4 ??C (97.5 ??F) (Oral)   07/23/22 36.6 ??C (97.8 ??F) (Temporal)     BP Readings from Last 3 Encounters:   09/03/22 95/63   08/05/22 122/68   07/23/22 92/59     Pulse Readings from Last 3 Encounters:   09/03/22 85   08/05/22 77   07/23/22 78       Pertinent Labs:  Office Visit on 09/03/2022   Component Date Value Ref Range Status    Color, UA 09/03/2022 Light Yellow   Final    Clarity, UA 09/03/2022 Clear   Final    Specific Gravity, UA 09/03/2022 1.016  1.003 - 1.030 Final    pH, UA 09/03/2022 5.0  5.0 - 9.0 Final    Leukocyte Esterase, UA 09/03/2022 Negative  Negative Final    Nitrite, UA 09/03/2022 Negative  Negative Final    Protein, UA 09/03/2022 Negative  Negative Final    Glucose, UA 09/03/2022 Negative  Negative Final    Ketones, UA 09/03/2022 Negative  Negative Final    Urobilinogen, UA 09/03/2022 <2.0 mg/dL  <0.1 mg/dL Final    Bilirubin, UA 09/03/2022 Negative  Negative Final    Blood, UA 09/03/2022 Negative  Negative Final    RBC, UA 09/03/2022 <1  <=3 /HPF Final    WBC, UA 09/03/2022 1  <=2 /HPF Final    Squam Epithel, UA 09/03/2022 <1  0 - 5 /HPF Final    Bacteria, UA 09/03/2022 None Seen  None Seen /HPF Final    Mucus, UA 09/03/2022 Rare (A)  None Seen /HPF Final   Lab on 09/03/2022   Component Date Value Ref Range Status    Iron 09/03/2022 40 (L)  65 - 175 ug/dL Final    TIBC 02/72/5366 288  250 - 425 ug/dL Final    Iron Saturation (%) 09/03/2022 14 (L)  20 - 55 % Final    Ferritin 09/03/2022 36.8  10.5 - 307.3 ng/mL Final    Sodium 09/03/2022 142  135 - 145 mmol/L Final    Potassium 09/03/2022 4.2  3.4 - 4.8 mmol/L Final    Chloride 09/03/2022 112 (H)  98 - 107 mmol/L Final    CO2 09/03/2022 25.0  20.0 - 31.0 mmol/L Final    Anion Gap 09/03/2022 5  5 - 14 mmol/L Final    BUN 09/03/2022 27 (H)  9 - 23 mg/dL Final    Creatinine 44/08/4740 0.96  0.73 - 1.18 mg/dL Final    BUN/Creatinine Ratio 09/03/2022 28   Final    eGFR CKD-EPI (2021) Male 09/03/2022 79  >=60 mL/min/1.60m2 Final    eGFR calculated with CKD-EPI 2021 equation in accordance with [employer] and [employer] of Nephrology Task Force recommendations.    Glucose 09/03/2022 149  70 - 179 mg/dL Final    Calcium 59/56/3875 9.8  8.7 - 10.4 mg/dL Final    Albumin 64/33/2951 3.2 (L)  3.4 - 5.0 g/dL Final    Total Protein 09/03/2022 6.3  5.7 - 8.2 g/dL Final    Total Bilirubin 09/03/2022 0.3  0.3 - 1.2 mg/dL Final    AST 88/41/6606 34  <=34 U/L Final    ALT 09/03/2022 29  10 - 49 U/L Final    Alkaline Phosphatase 09/03/2022 119 (H)  46 - 116 U/L Final    Magnesium 09/03/2022 2.0  1.6 - 2.6 mg/dL Final    Phosphorus 30/16/0109 2.3 (L)  2.4 -  5.1 mg/dL Final    LDH 91/47/8295 183  120 - 246 U/L Final    Uric Acid 09/03/2022 5.7  3.7 - 9.2 mg/dL Final    WBC 62/13/0865 110.1 (HH)  3.6 - 11.2 10*9/L Final    RBC 09/03/2022 3.51 (L)  4.26 - 5.60 10*12/L Final    HGB 09/03/2022 10.0 (L)  12.9 - 16.5 g/dL Final    HCT 78/46/9629 32.1 (L)  39.0 - 48.0 % Final    MCV 09/03/2022 91.5  77.6 - 95.7 fL Final    MCH 09/03/2022 28.6  25.9 - 32.4 pg Final    MCHC 09/03/2022 31.3 (L)  32.0 - 36.0 g/dL Final    RDW 52/84/1324 15.4 (H)  12.2 - 15.2 % Final    MPV 09/03/2022 7.8  6.8 - 10.7 fL Final    Platelet 09/03/2022 210  150 - 450 10*9/L Final    Neutrophils % 09/03/2022 7.9  % Final    Lymphocytes % 09/03/2022 90.9  % Final    Monocytes % 09/03/2022 1.0  % Final    Eosinophils % 09/03/2022 0.1  % Final    Basophils % 09/03/2022 0.1  % Final    Absolute Neutrophils 09/03/2022 8.7 (H)  1.8 - 7.8 10*9/L Final    Absolute Lymphocytes 09/03/2022 100.1 (H)  1.1 - 3.6 10*9/L Final    Absolute Monocytes 09/03/2022 1.1 (H)  0.3 - 0.8 10*9/L Final    Absolute Eosinophils 09/03/2022 0.1  0.0 - 0.5 10*9/L Final    Absolute Basophils 09/03/2022 0.1  0.0 - 0.1 10*9/L Final    Smear Review Comments 09/03/2022 See Comment (A)  Undefined Final    Abnormal lymphoid cells present.  Smudge Cells present.  [id number]; albumin: [id number]    Pathologist Smear Interpretation 09/03/2022 Confirmed by Hemepath Specialist/Senior Tech  Confirmed by Hemepath Fellow, Confirmed by Hemepath Specialist/Senior Tech, Confirmed by Core Specialist/Senior Tech, To Be Accessioned - Case Report to Follow, Physician Requested Path Review - BF/CSF: cytospin routed to hemepath, Physician Requested... Final       Allergies:   Allergies   Allergen Reactions    Amoxicillin Hives, Nausea Only and Other (See Comments)     GI upset       Drug Interactions: avoid strong CYP3A4 inh/ind; instructed patient to stop vitamin E, cranberry, and Reishi mushroom due to potential interactions/increased risk of bleeding      Current Medications:  Current Outpatient Medications   Medication Sig Dispense Refill    acalabrutinib (CALQUENCE) 100 mg tablet Take 1 tablet (100 mg total) by mouth two (2) times a day. Swallow whole with water. Do not to chew, crush, dissolve, or cut tablets. 60 tablet 11    acetaminophen (TYLENOL) 325 MG tablet Take 1.5 tablets (487.5 mg total) by mouth nightly. 100 tablet 2    allopurinol (ZYLOPRIM) 300 MG tablet Take 1 tablet (300 mg total) by mouth daily. 30 tablet 1    ascorbic acid, vitamin C, (VITAMIN C) 1000 MG tablet Take 1 tablet (1,000 mg total) by mouth.      aspirin (ECOTRIN) 81 MG tablet Take 1 tablet (81 mg total) by mouth.      atorvastatin (LIPITOR) 80 MG tablet       b complex vitamins (SUPER B-50 COMPLEX) capsule Take 1 capsule by mouth daily. 30 capsule 11    bimatoprost (LUMIGAN) 0.01 % Drop Administer 1 drop to both eyes nightly. 30 mL 0    cholecalciferol, vitamin D3-250 mcg, 10,000 unit,, 250  mcg (10,000 unit) capsule Take 0.5 capsules (125 mcg total) by mouth daily. 120 capsule 2    coenzyme Q10 200 mg capsule Take 1 capsule (200 mg total) by mouth.      ENTRESTO 24-26 mg tablet Take 1 tablet by mouth.      fish,bora,flax oils-om3,6,9no1 (OMEGA 3-6-9) 1,200 mg cap Take 1 tablet by mouth in the morning. 30 capsule 0    L.acidophil-L.plantar-Bifido 7 15 billion cell cap Take 5 mg by mouth in the morning. 30 capsule 0    magnesium gluconate (MAGONATE) 500 mg (27 mg elem magnesium) tablet Take 1 tablet by mouth daily. 30 tablet 0    mv-min-FA-vit K-lutein-zeaxant (PRESERVISION AREDS 2 PLUS MV) 200 mcg-15 mcg- 5 mg-1 mg cap Take 1 tablet by mouth in the morning. 30 capsule 0    selenium 200 mcg Tab Take 1 tablet (200 mcg total) by mouth.      tamsulosin (FLOMAX) 0.4 mg capsule       traMADol (ULTRAM) 50 mg tablet TAKE 1 TO 2 TABLETS BY MOUTH THREE TIMES DAILY AS NEEDED FOR PAIN      zinc gluconate 50 mg (7 mg elemental zinc) tablet Take 1 tablet (50 mg total) by mouth daily.      cyanocobalamin, vitamin B-12, 1000 MCG tablet Take 0.5 tablets (500 mcg total) by mouth daily. 30 tablet 0    ferrous sulfate 325 (65 FE) MG tablet Take 1 tablet (325 mg total) by mouth every other day. 30 tablet 11    lysine 1,000 mg Tab Take 1,000 mg by mouth.      midodrine (PROAMATINE) 5 MG tablet Take 1 tablet (5 mg total) by mouth Three (3) times a day.      NON FORMULARY Astralagus, Black elderberry, reishii      ondansetron (ZOFRAN) 4 MG tablet Take 1 tablet (4 mg) once a day as needed for nausea. 30 tablet 0    spironolactone (ALDACTONE) 25 MG tablet Take 1 tablet (25 mg total) by mouth daily.      valACYclovir (VALTREX) 500 MG tablet Take 1 tablet (500 mg total) by mouth daily. 30 tablet 5     No current facility-administered medications for this visit.       Adherence: no barriers identified        Assessment: Brendan Bishop is a 82 y.o. male with IGHV un-mutated, TP53 mutated CLL planning to start treatment with acalabrutinib + obinutuzumab per [facility]. Reviewed education for acala/obi and patient confirmed understanding of plan below.     Plan:   1) CLL  -C1D1 obinutuzumab today (100 mg today, 900 mg tomorrow, then 1000 mg C1D8, C1D15, and day 1 of each cycle thereafter, with a total of 6 cycles)  -Start acalabrutinib 100 mg PO BID - will start this weekend after start of obinutuzumab and prefers to start after work week  -CPP phone call ***, RTC in 4 weeks prior to C2 obi start    2) TLS prophylaxis  -Continue allopurinol 300 mg daily    3) Infection prophylaxis  -Start valacyclovir 500 mg daily    4) Supportive care  -Start ondansetron 4 mg daily prn nausea    5) Iron deficiency anemia  -Start ferumoxytol (Feraheme) 510 mg x 2 - will [facility] infusions at [facility] per patient preference     F/u:  Future Appointments   Date Time Provider Department Center   09/03/2022 10:35 AM ONCINF CHAIR 45 [id number]   09/04/2022 12:30 PM  ONCINF CHAIR 18 [id number]   09/10/2022  8:00 AM ONCINF CHAIR 20 [id number]   09/17/2022  1:00 PM ONCINF CHAIR 18 [id number]   10/01/2022  8:15 AM ADULT ONC LAB [facility]   10/01/2022  9:00 AM Silvio Pate, DO [id number]       I spent 60 minutes with Brendan Bishop in direct patient care.    Ronnald Collum, PharmD, BCOP, CPP  Hematology/Oncology Clinical Pharmacist  Pager [phone]

## 2022-09-03 NOTE — Unmapped (Signed)
Patient Instructions:     Schedule EGD and colonoscopy with local gastroenterologist.    Medical Team    Physician: Lawson Radar, DO  Pharmacists: Manfred Arch, PharmD, BCOP, CPP; Ronnald Collum, PharmD, BCOP  Nurse Navigator: Joaquin Bend, MSN, RN, [facility]    Regarding labs and other test results, please know that due to federal laws, all test results are now released and available for review on MyChart immediately upon becoming available.  This means that unlike before, you will now receive results before we can review them and attach comments and explanations. We will still attach those comments or call you as soon as we have all your results (for our standard blood tests, that usually takes 48-72 hours after they're drawn).  For some patients, seeing test results without our comments can cause anxiety about those results and even misunderstanding if a patient interprets them incorrectly.  We regret any such anxiety you may experience if you choose to review labs before we have commented. Please note, however, that to prevent anxiety, we recommend that you consider waiting to review your results until you receive an email that says we have attached our comments.  That makes sure that you receive our interpretation with your labs, which can help to avoid misunderstood results and perhaps undue anxiety.  Either way, please know we monitor your test results closely.    Labs:   Lab on 09/03/2022   Component Date Value Ref Range Status    Iron 09/03/2022 40 (L)  65 - 175 ug/dL Final    TIBC 52/84/1324 288  250 - 425 ug/dL Final    Iron Saturation (%) 09/03/2022 14 (L)  20 - 55 % Final    Ferritin 09/03/2022 36.8  10.5 - 307.3 ng/mL Final    Sodium 09/03/2022 142  135 - 145 mmol/L Final    Potassium 09/03/2022 4.2  3.4 - 4.8 mmol/L Final    Chloride 09/03/2022 112 (H)  98 - 107 mmol/L Final    CO2 09/03/2022 25.0  20.0 - 31.0 mmol/L Final    Anion Gap 09/03/2022 5  5 - 14 mmol/L Final    BUN 09/03/2022 27 (H)  9 - 23 mg/dL Final    Creatinine 40/03/2724 0.96  0.73 - 1.18 mg/dL Final    BUN/Creatinine Ratio 09/03/2022 28   Final    eGFR CKD-EPI (2021) Male 09/03/2022 79  >=60 mL/min/1.45m2 Final    eGFR calculated with CKD-EPI 2021 equation in accordance with [employer] and [employer] of Nephrology Task Force recommendations.    Glucose 09/03/2022 149  70 - 179 mg/dL Final    Calcium 36/64/4034 9.8  8.7 - 10.4 mg/dL Final    Albumin 74/25/9563 3.2 (L)  3.4 - 5.0 g/dL Final    Total Protein 09/03/2022 6.3  5.7 - 8.2 g/dL Final    Total Bilirubin 09/03/2022 0.3  0.3 - 1.2 mg/dL Final    AST 87/56/4332 34  <=34 U/L Final    ALT 09/03/2022 29  10 - 49 U/L Final    Alkaline Phosphatase 09/03/2022 119 (H)  46 - 116 U/L Final    Magnesium 09/03/2022 2.0  1.6 - 2.6 mg/dL Final    Phosphorus 95/18/8416 2.3 (L)  2.4 - 5.1 mg/dL Final    LDH 60/63/0160 183  120 - 246 U/L Final    Uric Acid 09/03/2022 5.7  3.7 - 9.2 mg/dL Final    WBC 10/93/2355 110.1 (HH)  3.6 - 11.2 10*9/L Preliminary  RBC 09/03/2022 3.51 (L)  4.26 - 5.60 10*12/L Preliminary    HGB 09/03/2022 10.0 (L)  12.9 - 16.5 g/dL Preliminary    HCT 16/03/9603 32.1 (L)  39.0 - 48.0 % Preliminary    MCV 09/03/2022 91.5  77.6 - 95.7 fL Preliminary    MCH 09/03/2022 28.6  25.9 - 32.4 pg Preliminary    MCHC 09/03/2022 31.3 (L)  32.0 - 36.0 g/dL Preliminary    RDW 54/02/8118 15.4 (H)  12.2 - 15.2 % Preliminary    MPV 09/03/2022 7.8  6.8 - 10.7 fL Preliminary    Platelet 09/03/2022 210  150 - 450 10*9/L Preliminary       CONTACT INFORMATION     PHONE:     From Monday through Friday, 8am - 5pm, for all questions including appointments and clinical issues please call [phone] or toll-free [phone].  Please ask for nurse triage.     On Nights, Weekends and Holidays, for emergencies call [phone].    Please fax Insurance, Disability, or FMLA paperwork to [phone]. Please allow 1 week for completion of paperwork.      Brendan Bishop (messages you want to send through the electronic health record):     1) Send messages to Dr. Zonia Kief as needed. Messages are reviewed by our nursing staff and handled appropriately.     2) *do not use this system to send urgent messages* (anything needing a response in less than 48 hours).  If you have an urgent need, please call the number above.     Questions About Your Visit?     If you have any questions about any information included in this visit summary, please contact your provider by sending a secure message using My [facility] Chart or by phone at the number listed above. Bring this form with you to your next scheduled appointment as a reminder to discuss with your provider. Use this form to make notes about any medications, including over-the-counter medications, you have stopped or started taking, or any questions you have about any test or procedure results.    Medication Refills: please do not send urgent refill requests through MyChart.  Please call the numbers above.

## 2022-09-03 NOTE — Unmapped (Signed)
Addended by: Silvio Pate on: 09/03/2022 04:52 PM     Modules accepted: Orders

## 2022-09-04 ENCOUNTER — Ambulatory Visit: Admit: 2022-09-04 | Discharge: 2022-09-05 | Payer: MEDICARE

## 2022-09-04 DIAGNOSIS — D508 Other iron deficiency anemias: Principal | ICD-10-CM

## 2022-09-04 LAB — BASIC METABOLIC PANEL
ANION GAP: 3 mmol/L — ABNORMAL LOW (ref 5–14)
BLOOD UREA NITROGEN: 36 mg/dL — ABNORMAL HIGH (ref 9–23)
BUN / CREAT RATIO: 39
CALCIUM: 8.7 mg/dL (ref 8.7–10.4)
CHLORIDE: 112 mmol/L — ABNORMAL HIGH (ref 98–107)
CO2: 22 mmol/L (ref 20.0–31.0)
CREATININE: 0.93 mg/dL
EGFR CKD-EPI (2021) MALE: 82 mL/min/{1.73_m2} (ref >=60)
GLUCOSE RANDOM: 132 mg/dL (ref 70–179)
POTASSIUM: 5 mmol/L — ABNORMAL HIGH (ref 3.4–4.8)
SODIUM: 137 mmol/L (ref 135–145)

## 2022-09-04 LAB — CBC W/ AUTO DIFF
HEMATOCRIT: 29.2 % — ABNORMAL LOW (ref 39.0–48.0)
HEMOGLOBIN: 9.6 g/dL — ABNORMAL LOW (ref 12.9–16.5)
MEAN CORPUSCULAR HEMOGLOBIN CONC: 32.8 g/dL (ref 32.0–36.0)
MEAN CORPUSCULAR HEMOGLOBIN: 29.1 pg (ref 25.9–32.4)
MEAN CORPUSCULAR VOLUME: 88.5 fL (ref 77.6–95.7)
MEAN PLATELET VOLUME: 8.5 fL (ref 6.8–10.7)
PLATELET COUNT: 105 10*9/L — ABNORMAL LOW (ref 150–450)
RED BLOOD CELL COUNT: 3.3 10*12/L — ABNORMAL LOW (ref 4.26–5.60)
RED CELL DISTRIBUTION WIDTH: 15.3 % — ABNORMAL HIGH (ref 12.2–15.2)
WBC ADJUSTED: 28.4 10*9/L — ABNORMAL HIGH (ref 3.6–11.2)

## 2022-09-04 LAB — PHOSPHORUS: PHOSPHORUS: 3.5 mg/dL (ref 2.4–5.1)

## 2022-09-04 LAB — URIC ACID: URIC ACID: 5.5 mg/dL

## 2022-09-04 MED ADMIN — sodium chloride (NS) 0.9 % infusion: 100 mL/h | INTRAVENOUS | @ 17:00:00

## 2022-09-04 MED ADMIN — obinutuzumab 900 mg in sodium chloride (NS) 0.9 % 250 mL IVPB: 900 mg | INTRAVENOUS | @ 17:00:00 | Stop: 2022-09-04

## 2022-09-04 MED ADMIN — dexAMETHasone (DECADRON) tablet 20 mg: 20 mg | ORAL | @ 16:00:00 | Stop: 2022-09-04

## 2022-09-04 MED ADMIN — acetaminophen (TYLENOL) tablet 650 mg: 650 mg | ORAL | @ 16:00:00 | Stop: 2022-09-04

## 2022-09-04 MED ADMIN — diphenhydrAMINE (BENADRYL) capsule 50 mg: 50 mg | ORAL | @ 16:00:00 | Stop: 2022-09-04

## 2022-09-04 NOTE — Unmapped (Signed)
RED ZONE Means: RED ZONE: Take action now!     You need to be seen right away  Symptoms are at a severe level of discomfort    Call 911 or go to your nearest  Hospital for help     - Bleeding that will not stop    - Hard to breathe    - New seizure - Chest pain  - Fall or passing out  -Thoughts of hurting    yourself or others      Call 911 if you are going into the RED ZONE                  YELLOW ZONE Means:     Please call with any new or worsening symptom(s), even if not on this list.  Call [phone]  After hours, weekends, and holidays - you will reach a long recording with specific instructions, If not in an emergency such as above, please listen closely all the way to the end and choose the option that relates to your need.   You can be seen by a provider the same day through our Same Day Acute Care for Patients with Cancer program.      YELLOW ZONE: Take action today     Symptoms are new or worsening  You are not within your goal range for:    - Pain    - Shortness of breath    - Bleeding (nose, urine, stool, wound)    - Feeling sick to your stomach and throwing up    - Mouth sores/pain in your mouth or throat    - Hard stool or very loose stools (increase in       ostomy output)    - No urine for 12 hours    - Feeding tube or other catheter/tube issue    - Redness or pain at previous IV or port/catheter site    - Depressed or anxiety   - Swelling (leg, arm, abdomen,     face, neck)  - Skin rash or skin changes  - Wound issues (redness, drainage,    re-opened)  - Confusion  - Vision changes  - Fever >100.4 F or chills  - Worsening cough with mucus that is    green, yellow, or bloody  - Pain or burning when going to the    bathroom  - Home Infusion Pump Issue- call    [phone]         Call your healthcare provider if you are going into the YELLOW ZONE     GREEN ZONE Means:  Your symptoms are under controls  Continue to take your medicine as ordered  Keep all visits to the provider GREEN ZONE: You are in control  No increase or worsening symptoms  Able to take your medicine  Able to drink and eat    - DO NOT use MyChart messages to report red or yellow symptoms. Allow up to 3    business days for a reply.  -MyChart is for non-urgent medication refills, scheduling requests, or other general questions.         [id number] Rev. 12/20/2021  Approved by Oncology Patient Education Committee        Office Visit on 09/03/2022   Component Date Value Ref Range Status    Color, UA 09/03/2022 Light Yellow   Final    Clarity, UA 09/03/2022 Clear   Final    Specific Gravity, UA 09/03/2022 1.016  1.003 -  1.030 Final    pH, UA 09/03/2022 5.0  5.0 - 9.0 Final    Leukocyte Esterase, UA 09/03/2022 Negative  Negative Final    Nitrite, UA 09/03/2022 Negative  Negative Final    Protein, UA 09/03/2022 Negative  Negative Final    Glucose, UA 09/03/2022 Negative  Negative Final    Ketones, UA 09/03/2022 Negative  Negative Final    Urobilinogen, UA 09/03/2022 <2.0 mg/dL  <5.4 mg/dL Final    Bilirubin, UA 09/03/2022 Negative  Negative Final    Blood, UA 09/03/2022 Negative  Negative Final    RBC, UA 09/03/2022 <1  <=3 /HPF Final    WBC, UA 09/03/2022 1  <=2 /HPF Final    Squam Epithel, UA 09/03/2022 <1  0 - 5 /HPF Final    Bacteria, UA 09/03/2022 None Seen  None Seen /HPF Final    Mucus, UA 09/03/2022 Rare (A)  None Seen /HPF Final   Lab on 09/03/2022   Component Date Value Ref Range Status    Iron 09/03/2022 40 (L)  65 - 175 ug/dL Final    TIBC 09/81/1914 288  250 - 425 ug/dL Final    Iron Saturation (%) 09/03/2022 14 (L)  20 - 55 % Final    Ferritin 09/03/2022 36.8  10.5 - 307.3 ng/mL Final    Sodium 09/03/2022 142  135 - 145 mmol/L Final    Potassium 09/03/2022 4.2  3.4 - 4.8 mmol/L Final    Chloride 09/03/2022 112 (H)  98 - 107 mmol/L Final    CO2 09/03/2022 25.0  20.0 - 31.0 mmol/L Final    Anion Gap 09/03/2022 5  5 - 14 mmol/L Final    BUN 09/03/2022 27 (H)  9 - 23 mg/dL Final    Creatinine 78/29/5621 0.96  0.73 - 1.18 mg/dL Final BUN/Creatinine Ratio 09/03/2022 28   Final    eGFR CKD-EPI (2021) Male 09/03/2022 79  >=60 mL/min/1.49m2 Final    eGFR calculated with CKD-EPI 2021 equation in accordance with [employer] and [employer] of Nephrology Task Force recommendations.    Glucose 09/03/2022 149  70 - 179 mg/dL Final    Calcium 30/86/5784 9.8  8.7 - 10.4 mg/dL Final    Albumin 69/62/9528 3.2 (L)  3.4 - 5.0 g/dL Final    Total Protein 09/03/2022 6.3  5.7 - 8.2 g/dL Final    Total Bilirubin 09/03/2022 0.3  0.3 - 1.2 mg/dL Final    AST 41/32/4401 34  <=34 U/L Final    ALT 09/03/2022 29  10 - 49 U/L Final    Alkaline Phosphatase 09/03/2022 119 (H)  46 - 116 U/L Final    Magnesium 09/03/2022 2.0  1.6 - 2.6 mg/dL Final    Phosphorus 02/72/5366 2.3 (L)  2.4 - 5.1 mg/dL Final    LDH 44/08/4740 183  120 - 246 U/L Final    Uric Acid 09/03/2022 5.7  3.7 - 9.2 mg/dL Final    WBC 59/56/3875 110.1 (HH)  3.6 - 11.2 10*9/L Final    RBC 09/03/2022 3.51 (L)  4.26 - 5.60 10*12/L Final    HGB 09/03/2022 10.0 (L)  12.9 - 16.5 g/dL Final    HCT 64/33/2951 32.1 (L)  39.0 - 48.0 % Final    MCV 09/03/2022 91.5  77.6 - 95.7 fL Final    MCH 09/03/2022 28.6  25.9 - 32.4 pg Final    MCHC 09/03/2022 31.3 (L)  32.0 - 36.0 g/dL Final    RDW 88/41/6606 15.4 (H)  12.2 - 15.2 % Final    MPV 09/03/2022 7.8  6.8 - 10.7 fL Final    Platelet 09/03/2022 210  150 - 450 10*9/L Final    Neutrophils % 09/03/2022 7.9  % Final    Lymphocytes % 09/03/2022 90.9  % Final    Monocytes % 09/03/2022 1.0  % Final    Eosinophils % 09/03/2022 0.1  % Final    Basophils % 09/03/2022 0.1  % Final    Absolute Neutrophils 09/03/2022 8.7 (H)  1.8 - 7.8 10*9/L Final    Absolute Lymphocytes 09/03/2022 100.1 (H)  1.1 - 3.6 10*9/L Final    Absolute Monocytes 09/03/2022 1.1 (H)  0.3 - 0.8 10*9/L Final    Absolute Eosinophils 09/03/2022 0.1  0.0 - 0.5 10*9/L Final    Absolute Basophils 09/03/2022 0.1  0.0 - 0.1 10*9/L Final    Smear Review Comments 09/03/2022 See Comment (A)  Undefined Final    Abnormal lymphoid cells present.  Smudge Cells present.  [id number]; albumin: [id number]    Pathologist Smear Interpretation 09/03/2022 Confirmed by Hemepath Specialist/Senior Tech  Confirmed by Hemepath Fellow, Confirmed by Hemepath Specialist/Senior Tech, Confirmed by Core Specialist/Senior Tech, To Be Accessioned - Case Report to Follow, Physician Requested Path Review - BF/CSF: cytospin routed to hemepath, Physician Requested... Final   ]

## 2022-09-05 DIAGNOSIS — D508 Other iron deficiency anemias: Principal | ICD-10-CM

## 2022-09-05 LAB — SLIDE REVIEW

## 2022-09-05 LAB — CBC W/ AUTO DIFF
BASOPHILS ABSOLUTE COUNT: 0 10*9/L (ref 0.0–0.1)
BASOPHILS RELATIVE PERCENT: 0.1 %
EOSINOPHILS ABSOLUTE COUNT: 0 10*9/L (ref 0.0–0.5)
EOSINOPHILS RELATIVE PERCENT: 0 %
HEMATOCRIT: 29.2 % — ABNORMAL LOW (ref 39.0–48.0)
HEMOGLOBIN: 9.6 g/dL — ABNORMAL LOW (ref 12.9–16.5)
LYMPHOCYTES ABSOLUTE COUNT: 13.3 10*9/L — ABNORMAL HIGH (ref 1.1–3.6)
LYMPHOCYTES RELATIVE PERCENT: 46.9 %
MEAN CORPUSCULAR HEMOGLOBIN CONC: 32.8 g/dL (ref 32.0–36.0)
MEAN CORPUSCULAR HEMOGLOBIN: 29.1 pg (ref 25.9–32.4)
MEAN CORPUSCULAR VOLUME: 88.5 fL (ref 77.6–95.7)
MEAN PLATELET VOLUME: 8.5 fL (ref 6.8–10.7)
MONOCYTES ABSOLUTE COUNT: 0.7 10*9/L (ref 0.3–0.8)
MONOCYTES RELATIVE PERCENT: 2.6 %
NEUTROPHILS ABSOLUTE COUNT: 14.3 10*9/L — ABNORMAL HIGH (ref 1.8–7.8)
NEUTROPHILS RELATIVE PERCENT: 50.4 %
PLATELET COUNT: 105 10*9/L — ABNORMAL LOW (ref 150–450)
RED BLOOD CELL COUNT: 3.3 10*12/L — ABNORMAL LOW (ref 4.26–5.60)
RED CELL DISTRIBUTION WIDTH: 15.3 % — ABNORMAL HIGH (ref 12.2–15.2)
WBC ADJUSTED: 28.4 10*9/L — ABNORMAL HIGH (ref 3.6–11.2)

## 2022-09-05 LAB — PERIPHERAL BLOOD SMEAR, PATH REVIEW

## 2022-09-05 NOTE — Unmapped (Signed)
12:45 - Pt ambulated to treatment area with stable gait for Cycle 1 Day 2 Gazyva. Pt states feeling well and denies any complaints at this time. Pt denies any further reaction Sx last night or this AM. PIV established and labs drawn and sent and pt medicated per order with pre-meds. Pt states hydrating well and aware goal to drink 1.5 - 2 liters of fluid a day after initial full dose of Gazyva to prevent TLS.    13:10 - Labs resulted stable and within parameters - Pt had reaction to 1st Gazyva infusion yesterday so today's infusion started at 25mg /hr (8.66ml/hr).    18:25 - Pt tolerated Gazyva infusion that was initiated at 25mg /hr for 30 minutes before titrating up to 50mg /hr and then titrated up 50mg /hr every 30 minutes every 30 minutes to a maximum rate of 138 mL/hr (400 mg/hr). Pt tolerated treatment well with VSS and no adverse reaction noted. PIV flushed with good BR and removed per protocol. Pt given copy of AVS and ambulated from treatment area with stable gait accompanied by friend.

## 2022-09-05 NOTE — Unmapped (Addendum)
Hospital Outpatient Visit on 09/04/2022   Component Date Value Ref Range Status    Sodium 09/04/2022 137  135 - 145 mmol/L Final    Potassium 09/04/2022 5.0 (H)  3.4 - 4.8 mmol/L Final    Chloride 09/04/2022 112 (H)  98 - 107 mmol/L Final    CO2 09/04/2022 22.0  20.0 - 31.0 mmol/L Final    Anion Gap 09/04/2022 3 (L)  5 - 14 mmol/L Final    BUN 09/04/2022 36 (H)  9 - 23 mg/dL Final    Creatinine 16/03/9603 0.93  0.73 - 1.18 mg/dL Final    BUN/Creatinine Ratio 09/04/2022 39   Final    eGFR CKD-EPI (2021) Male 09/04/2022 82  >=60 mL/min/1.81m2 Final    eGFR calculated with CKD-EPI 2021 equation in accordance with [employer] and [employer] of Nephrology Task Force recommendations.    Glucose 09/04/2022 132  70 - 179 mg/dL Final    Calcium 54/02/8118 8.7  8.7 - 10.4 mg/dL Final    Phosphorus 14/78/2956 3.5  2.4 - 5.1 mg/dL Final    Uric Acid 21/30/8657 5.5  3.7 - 9.2 mg/dL Final

## 2022-09-05 NOTE — Unmapped (Signed)
TC to patient to check on him as he received C1D1 obinutuzumab 2 days ago. He states that he is feeling fine and has not had any side effects. He has no complaints at this time and is aware of his next appts.    Thanks    [employer]

## 2022-09-10 ENCOUNTER — Ambulatory Visit: Admit: 2022-09-10 | Discharge: 2022-09-11 | Payer: MEDICARE

## 2022-09-10 LAB — COMPREHENSIVE METABOLIC PANEL
ALBUMIN: 3 g/dL — ABNORMAL LOW (ref 3.4–5.0)
ALKALINE PHOSPHATASE: 117 U/L — ABNORMAL HIGH (ref 46–116)
ALT (SGPT): 27 U/L (ref 10–49)
ANION GAP: 4 mmol/L — ABNORMAL LOW (ref 5–14)
AST (SGOT): 32 U/L (ref <=34)
BILIRUBIN TOTAL: 0.4 mg/dL (ref 0.3–1.2)
BLOOD UREA NITROGEN: 33 mg/dL — ABNORMAL HIGH (ref 9–23)
BUN / CREAT RATIO: 30
CALCIUM: 9.2 mg/dL (ref 8.7–10.4)
CHLORIDE: 108 mmol/L — ABNORMAL HIGH (ref 98–107)
CO2: 24 mmol/L (ref 20.0–31.0)
CREATININE: 1.11 mg/dL
EGFR CKD-EPI (2021) MALE: 66 mL/min/{1.73_m2} (ref >=60)
GLUCOSE RANDOM: 127 mg/dL (ref 70–179)
POTASSIUM: 4.9 mmol/L — ABNORMAL HIGH (ref 3.4–4.8)
PROTEIN TOTAL: 5.7 g/dL (ref 5.7–8.2)
SODIUM: 136 mmol/L (ref 135–145)

## 2022-09-10 LAB — MAGNESIUM: MAGNESIUM: 2.1 mg/dL (ref 1.6–2.6)

## 2022-09-10 LAB — CBC W/ AUTO DIFF
BASOPHILS ABSOLUTE COUNT: 0 10*9/L (ref 0.0–0.1)
BASOPHILS RELATIVE PERCENT: 0.1 %
EOSINOPHILS ABSOLUTE COUNT: 0.4 10*9/L (ref 0.0–0.5)
EOSINOPHILS RELATIVE PERCENT: 3.4 %
HEMATOCRIT: 32.6 % — ABNORMAL LOW (ref 39.0–48.0)
HEMOGLOBIN: 10.7 g/dL — ABNORMAL LOW (ref 12.9–16.5)
LYMPHOCYTES ABSOLUTE COUNT: 6.8 10*9/L — ABNORMAL HIGH (ref 1.1–3.6)
LYMPHOCYTES RELATIVE PERCENT: 54.7 %
MEAN CORPUSCULAR HEMOGLOBIN CONC: 32.8 g/dL (ref 32.0–36.0)
MEAN CORPUSCULAR HEMOGLOBIN: 28.9 pg (ref 25.9–32.4)
MEAN CORPUSCULAR VOLUME: 88.2 fL (ref 77.6–95.7)
MEAN PLATELET VOLUME: 9 fL (ref 6.8–10.7)
MONOCYTES ABSOLUTE COUNT: 0.7 10*9/L (ref 0.3–0.8)
MONOCYTES RELATIVE PERCENT: 5.7 %
NEUTROPHILS ABSOLUTE COUNT: 4.5 10*9/L (ref 1.8–7.8)
NEUTROPHILS RELATIVE PERCENT: 36.1 %
PLATELET COUNT: 130 10*9/L — ABNORMAL LOW (ref 150–450)
RED BLOOD CELL COUNT: 3.69 10*12/L — ABNORMAL LOW (ref 4.26–5.60)
RED CELL DISTRIBUTION WIDTH: 15.2 % (ref 12.2–15.2)
WBC ADJUSTED: 12.5 10*9/L — ABNORMAL HIGH (ref 3.6–11.2)

## 2022-09-10 LAB — URIC ACID: URIC ACID: 4.8 mg/dL

## 2022-09-10 LAB — IRON PANEL
IRON SATURATION: 29 % (ref 20–55)
IRON: 88 ug/dL
TOTAL IRON BINDING CAPACITY: 303 ug/dL (ref 250–425)

## 2022-09-10 LAB — SLIDE REVIEW

## 2022-09-10 LAB — LACTATE DEHYDROGENASE: LACTATE DEHYDROGENASE: 205 U/L (ref 120–246)

## 2022-09-10 LAB — PHOSPHORUS: PHOSPHORUS: 3.5 mg/dL (ref 2.4–5.1)

## 2022-09-10 LAB — FERRITIN: FERRITIN: 35.6 ng/mL

## 2022-09-10 MED ADMIN — sodium chloride (NS) 0.9 % infusion: 100 mL/h | INTRAVENOUS | @ 16:00:00

## 2022-09-10 MED ADMIN — sodium chloride (NS) 0.9 % infusion: 20 mL/h | INTRAVENOUS | @ 14:00:00 | Stop: 2022-09-10

## 2022-09-10 MED ADMIN — diphenhydrAMINE (BENADRYL) capsule 50 mg: 50 mg | ORAL | @ 14:00:00 | Stop: 2022-09-10

## 2022-09-10 MED ADMIN — ferumoxytoL (FERAHEME) 510 mg in sodium chloride (NS) 0.9 % 100 mL IVPB: 510 mg | INTRAVENOUS | @ 15:00:00 | Stop: 2022-09-10

## 2022-09-10 MED ADMIN — acetaminophen (TYLENOL) tablet 650 mg: 650 mg | ORAL | @ 14:00:00 | Stop: 2022-09-10

## 2022-09-10 MED ADMIN — dexAMETHasone (DECADRON) tablet 20 mg: 20 mg | ORAL | @ 14:00:00 | Stop: 2022-09-10

## 2022-09-10 MED ADMIN — cetirizine (ZYRTEC) tablet 10 mg: 10 mg | ORAL | @ 14:00:00 | Stop: 2022-09-10

## 2022-09-10 MED ADMIN — obinutuzumab 1,000 mg in sodium chloride (NS) 0.9 % 250 mL IVPB: 1000 mg | INTRAVENOUS | @ 16:00:00 | Stop: 2022-09-10

## 2022-09-10 NOTE — Unmapped (Signed)
RED ZONE Means: RED ZONE: Take action now!     You need to be seen right away  Symptoms are at a severe level of discomfort    Call 911 or go to your nearest  Hospital for help     - Bleeding that will not stop    - Hard to breathe    - New seizure - Chest pain  - Fall or passing out  -Thoughts of hurting    yourself or others      Call 911 if you are going into the RED ZONE                  YELLOW ZONE Means:     Please call with any new or worsening symptom(s), even if not on this list.  Call [phone]  After hours, weekends, and holidays - you will reach a long recording with specific instructions, If not in an emergency such as above, please listen closely all the way to the end and choose the option that relates to your need.   You can be seen by a provider the same day through our Same Day Acute Care for Patients with Cancer program.      YELLOW ZONE: Take action today     Symptoms are new or worsening  You are not within your goal range for:    - Pain    - Shortness of breath    - Bleeding (nose, urine, stool, wound)    - Feeling sick to your stomach and throwing up    - Mouth sores/pain in your mouth or throat    - Hard stool or very loose stools (increase in       ostomy output)    - No urine for 12 hours    - Feeding tube or other catheter/tube issue    - Redness or pain at previous IV or port/catheter site    - Depressed or anxiety   - Swelling (leg, arm, abdomen,     face, neck)  - Skin rash or skin changes  - Wound issues (redness, drainage,    re-opened)  - Confusion  - Vision changes  - Fever >100.4 F or chills  - Worsening cough with mucus that is    green, yellow, or bloody  - Pain or burning when going to the    bathroom  - Home Infusion Pump Issue- call    [phone]         Call your healthcare provider if you are going into the YELLOW ZONE     GREEN ZONE Means:  Your symptoms are under controls  Continue to take your medicine as ordered  Keep all visits to the provider GREEN ZONE: You are in control  No increase or worsening symptoms  Able to take your medicine  Able to drink and eat    - DO NOT use MyChart messages to report red or yellow symptoms. Allow up to 3    business days for a reply.  -MyChart is for non-urgent medication refills, scheduling requests, or other general questions.         [id number] Rev. 12/20/2021  Approved by Oncology Patient Education Committee        Hospital Outpatient Visit on 09/10/2022   Component Date Value Ref Range Status    Sodium 09/10/2022 136  135 - 145 mmol/L Final    Potassium 09/10/2022 4.9 (H)  3.4 - 4.8 mmol/L Final    Chloride 09/10/2022 108 (  H)  98 - 107 mmol/L Final    CO2 09/10/2022 24.0  20.0 - 31.0 mmol/L Final    Anion Gap 09/10/2022 4 (L)  5 - 14 mmol/L Final    BUN 09/10/2022 33 (H)  9 - 23 mg/dL Final    Creatinine 96/29/5284 1.11  0.73 - 1.18 mg/dL Final    BUN/Creatinine Ratio 09/10/2022 30   Final    eGFR CKD-EPI (2021) Male 09/10/2022 66  >=60 mL/min/1.84m2 Final    eGFR calculated with CKD-EPI 2021 equation in accordance with [employer] and [employer] of Nephrology Task Force recommendations.    Glucose 09/10/2022 127  70 - 179 mg/dL Final    Calcium 13/24/4010 9.2  8.7 - 10.4 mg/dL Final    Albumin 27/25/3664 3.0 (L)  3.4 - 5.0 g/dL Final    Total Protein 09/10/2022 5.7  5.7 - 8.2 g/dL Final    Total Bilirubin 09/10/2022 0.4  0.3 - 1.2 mg/dL Final    AST 40/34/7425 32  <=34 U/L Final    ALT 09/10/2022 27  10 - 49 U/L Final    Alkaline Phosphatase 09/10/2022 117 (H)  46 - 116 U/L Final    Magnesium 09/10/2022 2.1  1.6 - 2.6 mg/dL Final    Phosphorus 95/63/8756 3.5  2.4 - 5.1 mg/dL Final    LDH 43/32/9518 205  120 - 246 U/L Final    Uric Acid 09/10/2022 4.8  3.7 - 9.2 mg/dL Final    WBC 84/16/6063 12.5 (H)  3.6 - 11.2 10*9/L Final    RBC 09/10/2022 3.69 (L)  4.26 - 5.60 10*12/L Final    HGB 09/10/2022 10.7 (L)  12.9 - 16.5 g/dL Final    HCT 01/60/1093 32.6 (L)  39.0 - 48.0 % Final    MCV 09/10/2022 88.2  77.6 - 95.7 fL Final    MCH 09/10/2022 28.9  25.9 - 32.4 pg Final    MCHC 09/10/2022 32.8  32.0 - 36.0 g/dL Final    RDW 23/55/7322 15.2  12.2 - 15.2 % Final    MPV 09/10/2022 9.0  6.8 - 10.7 fL Final    Platelet 09/10/2022 130 (L)  150 - 450 10*9/L Final    Neutrophils % 09/10/2022 36.1  % Final    Lymphocytes % 09/10/2022 54.7  % Final    Monocytes % 09/10/2022 5.7  % Final    Eosinophils % 09/10/2022 3.4  % Final    Basophils % 09/10/2022 0.1  % Final    Absolute Neutrophils 09/10/2022 4.5  1.8 - 7.8 10*9/L Final    Absolute Lymphocytes 09/10/2022 6.8 (H)  1.1 - 3.6 10*9/L Final    Absolute Monocytes 09/10/2022 0.7  0.3 - 0.8 10*9/L Final    Absolute Eosinophils 09/10/2022 0.4  0.0 - 0.5 10*9/L Final    Absolute Basophils 09/10/2022 0.0  0.0 - 0.1 10*9/L Final    Ferritin 09/10/2022 35.6  10.5 - 307.3 ng/mL Final    Iron 09/10/2022 88  65 - 175 ug/dL Final    TIBC 02/54/2706 303  250 - 425 ug/dL Final    Iron Saturation (%) 09/10/2022 29  20 - 55 % Final    Smear Review Comments 09/10/2022 See Comment (A)  Undefined Final    Slide Reviewed.Abnormal lymphoid cells present.  Instrument [id number]    Hypersegmented Neutrophils 09/10/2022 Present (A)  Not Present Final

## 2022-09-11 NOTE — Unmapped (Signed)
Pt completed chemo infusion without difficulty.  Tolerated Feraheme infusion without problems.  Pt was stable at discharge via self ambulation with steady gait and SO.

## 2022-09-16 DIAGNOSIS — C9110 Chronic lymphocytic leukemia of B-cell type not having achieved remission: Principal | ICD-10-CM

## 2022-09-16 DIAGNOSIS — Z5111 Encounter for antineoplastic chemotherapy: Principal | ICD-10-CM

## 2022-09-16 DIAGNOSIS — Z1159 Encounter for screening for other viral diseases: Principal | ICD-10-CM

## 2022-09-17 ENCOUNTER — Other Ambulatory Visit: Admit: 2022-09-17 | Discharge: 2022-09-18 | Payer: MEDICARE

## 2022-09-17 ENCOUNTER — Institutional Professional Consult (permissible substitution): Admit: 2022-09-17 | Discharge: 2022-09-18 | Payer: MEDICARE

## 2022-09-17 ENCOUNTER — Ambulatory Visit: Admit: 2022-09-17 | Discharge: 2022-09-18 | Payer: MEDICARE

## 2022-09-17 LAB — IRON PANEL
IRON SATURATION: 18 % — ABNORMAL LOW (ref 20–55)
IRON: 51 ug/dL — ABNORMAL LOW
TOTAL IRON BINDING CAPACITY: 290 ug/dL (ref 250–425)

## 2022-09-17 LAB — COMPREHENSIVE METABOLIC PANEL
ALBUMIN: 3.1 g/dL — ABNORMAL LOW (ref 3.4–5.0)
ALKALINE PHOSPHATASE: 109 U/L (ref 46–116)
ALT (SGPT): 24 U/L (ref 10–49)
ANION GAP: 4 mmol/L — ABNORMAL LOW (ref 5–14)
AST (SGOT): 31 U/L (ref <=34)
BILIRUBIN TOTAL: 0.3 mg/dL (ref 0.3–1.2)
BLOOD UREA NITROGEN: 32 mg/dL — ABNORMAL HIGH (ref 9–23)
BUN / CREAT RATIO: 32
CALCIUM: 9.3 mg/dL (ref 8.7–10.4)
CHLORIDE: 112 mmol/L — ABNORMAL HIGH (ref 98–107)
CO2: 25 mmol/L (ref 20.0–31.0)
CREATININE: 1 mg/dL
EGFR CKD-EPI (2021) MALE: 75 mL/min/{1.73_m2} (ref >=60)
GLUCOSE RANDOM: 117 mg/dL (ref 70–179)
POTASSIUM: 4.9 mmol/L (ref 3.5–5.1)
PROTEIN TOTAL: 5.7 g/dL (ref 5.7–8.2)
SODIUM: 141 mmol/L (ref 135–145)

## 2022-09-17 LAB — CBC W/ AUTO DIFF
BASOPHILS ABSOLUTE COUNT: 0.1 10*9/L (ref 0.0–0.1)
BASOPHILS RELATIVE PERCENT: 0.4 %
EOSINOPHILS ABSOLUTE COUNT: 0.4 10*9/L (ref 0.0–0.5)
EOSINOPHILS RELATIVE PERCENT: 2.5 %
HEMATOCRIT: 31.9 % — ABNORMAL LOW (ref 39.0–48.0)
HEMOGLOBIN: 10.7 g/dL — ABNORMAL LOW (ref 12.9–16.5)
LYMPHOCYTES ABSOLUTE COUNT: 9.5 10*9/L — ABNORMAL HIGH (ref 1.1–3.6)
LYMPHOCYTES RELATIVE PERCENT: 61.8 %
MEAN CORPUSCULAR HEMOGLOBIN CONC: 33.4 g/dL (ref 32.0–36.0)
MEAN CORPUSCULAR HEMOGLOBIN: 29.8 pg (ref 25.9–32.4)
MEAN CORPUSCULAR VOLUME: 89.1 fL (ref 77.6–95.7)
MEAN PLATELET VOLUME: 8.6 fL (ref 6.8–10.7)
MONOCYTES ABSOLUTE COUNT: 0.6 10*9/L (ref 0.3–0.8)
MONOCYTES RELATIVE PERCENT: 3.9 %
NEUTROPHILS ABSOLUTE COUNT: 4.8 10*9/L (ref 1.8–7.8)
NEUTROPHILS RELATIVE PERCENT: 31.4 %
PLATELET COUNT: 146 10*9/L — ABNORMAL LOW (ref 150–450)
RED BLOOD CELL COUNT: 3.58 10*12/L — ABNORMAL LOW (ref 4.26–5.60)
RED CELL DISTRIBUTION WIDTH: 15.9 % — ABNORMAL HIGH (ref 12.2–15.2)
WBC ADJUSTED: 15.4 10*9/L — ABNORMAL HIGH (ref 3.6–11.2)

## 2022-09-17 LAB — SLIDE REVIEW

## 2022-09-17 LAB — FERRITIN: FERRITIN: 312.4 ng/mL — ABNORMAL HIGH

## 2022-09-17 LAB — LACTATE DEHYDROGENASE: LACTATE DEHYDROGENASE: 145 U/L (ref 120–246)

## 2022-09-17 LAB — PHOSPHORUS: PHOSPHORUS: 3 mg/dL (ref 2.4–5.1)

## 2022-09-17 LAB — URIC ACID: URIC ACID: 4.5 mg/dL

## 2022-09-17 LAB — MAGNESIUM: MAGNESIUM: 2 mg/dL (ref 1.6–2.6)

## 2022-09-17 MED ADMIN — dexAMETHasone (DECADRON) tablet 4 mg: 4 mg | ORAL | @ 22:00:00 | Stop: 2022-09-17

## 2022-09-17 MED ADMIN — acetaminophen (TYLENOL) tablet 650 mg: 650 mg | ORAL | @ 17:00:00 | Stop: 2022-09-17

## 2022-09-17 MED ADMIN — dexAMETHasone (DECADRON) tablet 20 mg: 20 mg | ORAL | @ 17:00:00 | Stop: 2022-09-17

## 2022-09-17 MED ADMIN — obinutuzumab 1,000 mg in sodium chloride (NS) 0.9 % 250 mL IVPB: 1000 mg | INTRAVENOUS | @ 19:00:00 | Stop: 2022-09-17

## 2022-09-17 MED ADMIN — ferumoxytoL (FERAHEME) 510 mg in sodium chloride (NS) 0.9 % 100 mL IVPB: 510 mg | INTRAVENOUS | @ 23:00:00 | Stop: 2022-09-17

## 2022-09-17 MED ADMIN — diphenhydrAMINE (BENADRYL) capsule 50 mg: 50 mg | ORAL | @ 17:00:00 | Stop: 2022-09-17

## 2022-09-17 MED ADMIN — sodium chloride (NS) 0.9 % infusion: 20 mL/h | INTRAVENOUS | @ 22:00:00 | Stop: 2022-09-17

## 2022-09-17 MED ADMIN — sodium chloride (NS) 0.9 % infusion: 100 mL/h | INTRAVENOUS | @ 18:00:00

## 2022-09-17 MED ADMIN — cetirizine (ZYRTEC) tablet 10 mg: 10 mg | ORAL | @ 22:00:00 | Stop: 2022-09-17

## 2022-09-17 NOTE — Unmapped (Signed)
Brendan Bishop is a 82 y.o. male with CLL who I am seeing in clinic today for oral chemotherapy monitoring    Encounter Date: 09/17/2022    Current Treatment: obinutuzumab/acalabrutinib (obi C1D15 today, acalabrutinib started 09/06/22)    For oral chemotherapy:  Pharmacy: [facility]   Medication Access: $0 copay with grant    Interval History: I spoke to Brendan Bishop today after start of acalabrutinib/obinutuzumab. Mr. [name] reports he feels great. Outside of obinutuzumab infusion reaction, he hasn't had any issues. He's taking acalabrutinib 100 mg twice a day without missed doses. No nausea/vomiting, constipation/diarrhea. Energy good, appetite good. No SOB or dizziness. No headaches or myalgias. No bruising or bleeding.     Labs: WBC 15.4, ANC 4.8, ALC 9.5, Hgb 10.7, PLT 146; CMP stable    Oncologic History:  Hematology/Oncology History   CLL (chronic lymphocytic leukemia) (CMS-HCC)   05/21/2021 Initial Diagnosis    CLL (chronic lymphocytic leukemia) (CMS-HCC)  -While he was officially diagnosed in 2022, he had a mild lymphocytosis dating back to 2017, when he was undergoing radiation for his prostate cancer.  On 02/08/2016, he had a flow cytometry, which showed a small population of monoclonal B-cells that were CD5-/CD10- with a total of 2.9k/uL clonal cells.  Observation was recommended.   -further evaluation of asymptomatic lymphocytosis ensued 05/21/2021.  -WBC 20.1, Hgb 12.5, Plt 238, ALC 13.8, ANC 3.96, B2M Not done, LDH 116 (ULN 192)  -Flow showed monoclonal B-cell population consistent with CLL with absolute clonal count of 3.74 k/UL, however a separate population of CD5-/CD10- monoclonal B-cells was detected  -Rai Stage: 1  -IGHV: Unmutated  -FISH showed: Normal  -Karyotype showed: Not completed.  -Recommendation: observation       07/01/2021 Interval Scan(s)    PET scan with scattered LAD, max size 2.0cm, No hepatosplenomegaly.     07/05/2021 Biopsy    Bone marrow biopsy showed hypercellular bone marrow with involvement of a low-grade B-cell lymphoproliferative disorder.  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 Ki-67 stain is variable in the interfollicular areas, 10% to 30%.   There are two separate B cell clones, in roughly equal proportions:   1) Kappa light chain restricted (dim expression), CD20+ (dim), CD5+, CD23+, CD43+, FMC-   2) Lambda light chain restricted, CD20+, CD5-, CD23+, CD43-, FMC+(dim).      06/26/2022 Interval Scan(s)    PET scan with continued LAD with mild progression.  Largest node 2.4cm in mesentery.     07/23/2022 Molecular Markers    -FISH showed: Normal  -Karyotype showed: 46,XY,del(7)(q31q32)[14]/46,XY[6]   -TP53 mutation was detected c.375G>A      08/05/2022 Biopsy    Bone marrow biopsy:  -  Hypercellular bone marrow (90%) primarily involved by CD5+ B-cell lymphoma/leukemia, consistent with chronic lymphocytic leukemia, representing approximately 70% of marrow cellularity by immunohistochemistry (see Comment)  -  Clonal immunoglobulin heavy (IGH) and kappa light chain (IGK) gene rearrangements identified (see Comment)          Weight and Vitals:  Wt Readings from Last 3 Encounters:   09/04/22 80.5 kg (177 lb 7.5 oz)   09/03/22 78.7 kg (173 lb 8 oz)   07/23/22 78.4 kg (172 lb 13.5 oz)     Temp Readings from Last 3 Encounters:   09/10/22 36.5 ??C (97.7 ??F) (Oral)   09/04/22 36.9 ??C (98.4 ??F) (Oral)   09/03/22 36.7 ??C (  98.1 ??F) (Oral)     BP Readings from Last 3 Encounters:   09/10/22 95/55   09/04/22 108/62   09/03/22 116/65     Pulse Readings from Last 3 Encounters:   09/10/22 95   09/04/22 57   09/03/22 97       Pertinent Labs:  No visits with results within 1 Day(s) from this visit.   Latest known visit with results is:   Hospital Outpatient Visit on 09/10/2022   Component Date Value Ref Range Status    Sodium 09/10/2022 136  135 - 145 mmol/L Final    Potassium 09/10/2022 4.9 (H)  3.4 - 4.8 mmol/L Final    Chloride 09/10/2022 108 (H)  98 - 107 mmol/L Final    CO2 09/10/2022 24.0  20.0 - 31.0 mmol/L Final    Anion Gap 09/10/2022 4 (L)  5 - 14 mmol/L Final    BUN 09/10/2022 33 (H)  9 - 23 mg/dL Final    Creatinine 09/81/1914 1.11  0.73 - 1.18 mg/dL Final    BUN/Creatinine Ratio 09/10/2022 30   Final    eGFR CKD-EPI (2021) Male 09/10/2022 66  >=60 mL/min/1.68m2 Final    eGFR calculated with CKD-EPI 2021 equation in accordance with [employer] and [employer] of Nephrology Task Force recommendations.    Glucose 09/10/2022 127  70 - 179 mg/dL Final    Calcium 78/29/5621 9.2  8.7 - 10.4 mg/dL Final    Albumin 30/86/5784 3.0 (L)  3.4 - 5.0 g/dL Final    Total Protein 09/10/2022 5.7  5.7 - 8.2 g/dL Final    Total Bilirubin 09/10/2022 0.4  0.3 - 1.2 mg/dL Final    AST 69/62/9528 32  <=34 U/L Final    ALT 09/10/2022 27  10 - 49 U/L Final    Alkaline Phosphatase 09/10/2022 117 (H)  46 - 116 U/L Final    Magnesium 09/10/2022 2.1  1.6 - 2.6 mg/dL Final    Phosphorus 41/32/4401 3.5  2.4 - 5.1 mg/dL Final    LDH 02/72/5366 205  120 - 246 U/L Final    Uric Acid 09/10/2022 4.8  3.7 - 9.2 mg/dL Final    WBC 44/08/4740 12.5 (H)  3.6 - 11.2 10*9/L Final    RBC 09/10/2022 3.69 (L)  4.26 - 5.60 10*12/L Final    HGB 09/10/2022 10.7 (L)  12.9 - 16.5 g/dL Final    HCT 59/56/3875 32.6 (L)  39.0 - 48.0 % Final    MCV 09/10/2022 88.2  77.6 - 95.7 fL Final    MCH 09/10/2022 28.9  25.9 - 32.4 pg Final    MCHC 09/10/2022 32.8  32.0 - 36.0 g/dL Final    RDW 64/33/2951 15.2  12.2 - 15.2 % Final    MPV 09/10/2022 9.0  6.8 - 10.7 fL Final    Platelet 09/10/2022 130 (L)  150 - 450 10*9/L Final    Neutrophils % 09/10/2022 36.1  % Final    Lymphocytes % 09/10/2022 54.7  % Final    Monocytes % 09/10/2022 5.7  % Final    Eosinophils % 09/10/2022 3.4  % Final    Basophils % 09/10/2022 0.1  % Final    Absolute Neutrophils 09/10/2022 4.5  1.8 - 7.8 10*9/L Final    Absolute Lymphocytes 09/10/2022 6.8 (H)  1.1 - 3.6 10*9/L Final    Absolute Monocytes 09/10/2022 0.7  0.3 - 0.8 10*9/L Final    Absolute Eosinophils 09/10/2022 0.4  0.0 - 0.5 10*9/L Final  Absolute Basophils 09/10/2022 0.0  0.0 - 0.1 10*9/L Final    Ferritin 09/10/2022 35.6  10.5 - 307.3 ng/mL Final    Iron 09/10/2022 88  65 - 175 ug/dL Final    TIBC 16/03/9603 303  250 - 425 ug/dL Final    Iron Saturation (%) 09/10/2022 29  20 - 55 % Final    Smear Review Comments 09/10/2022 See Comment (A)  Undefined Final    Slide Reviewed.Abnormal lymphoid cells present.  Instrument [id number]    Hypersegmented Neutrophils 09/10/2022 Present (A)  Not Present Final       Allergies:   Allergies   Allergen Reactions    Amoxicillin Hives, Nausea Only and Other (See Comments)     GI upset       Drug Interactions: avoid strong CYP3A4 inh/ind; instructed patient to stop vitamin E, cranberry, and Reishi mushroom due to potential interactions/increased risk of bleeding      Current Medications:  Current Outpatient Medications   Medication Sig Dispense Refill    acalabrutinib (CALQUENCE) 100 mg tablet Take 1 tablet (100 mg total) by mouth two (2) times a day. Swallow whole with water. Do not to chew, crush, dissolve, or cut tablets. 60 tablet 11    acetaminophen (TYLENOL) 325 MG tablet Take 1.5 tablets (487.5 mg total) by mouth nightly. 100 tablet 2    allopurinol (ZYLOPRIM) 300 MG tablet Take 1 tablet (300 mg total) by mouth daily. 30 tablet 1    ascorbic acid, vitamin C, (VITAMIN C) 1000 MG tablet Take 1 tablet (1,000 mg total) by mouth.      aspirin (ECOTRIN) 81 MG tablet Take 1 tablet (81 mg total) by mouth.      atorvastatin (LIPITOR) 80 MG tablet       b complex vitamins (SUPER B-50 COMPLEX) capsule Take 1 capsule by mouth daily. 30 capsule 11    bimatoprost (LUMIGAN) 0.01 % Drop Administer 1 drop to both eyes nightly. 30 mL 0    cholecalciferol, vitamin D3-250 mcg, 10,000 unit,, 250 mcg (10,000 unit) capsule Take 0.5 capsules (125 mcg total) by mouth daily. 120 capsule 2 coenzyme Q10 200 mg capsule Take 1 capsule (200 mg total) by mouth.      cyanocobalamin, vitamin B-12, 1000 MCG tablet Take 0.5 tablets (500 mcg total) by mouth daily. 30 tablet 0    ENTRESTO 24-26 mg tablet Take 1 tablet by mouth.      ferrous sulfate 325 (65 FE) MG tablet Take 1 tablet (325 mg total) by mouth every other day. 30 tablet 11    fish,bora,flax oils-om3,6,9no1 (OMEGA 3-6-9) 1,200 mg cap Take 1 tablet by mouth in the morning. 30 capsule 0    L.acidophil-L.plantar-Bifido 7 15 billion cell cap Take 5 mg by mouth in the morning. 30 capsule 0    lysine 1,000 mg Tab Take 1,000 mg by mouth.      magnesium gluconate (MAGONATE) 500 mg (27 mg elem magnesium) tablet Take 1 tablet by mouth daily. 30 tablet 0    midodrine (PROAMATINE) 5 MG tablet Take 1 tablet (5 mg total) by mouth Three (3) times a day.      mv-min-FA-vit K-lutein-zeaxant (PRESERVISION AREDS 2 PLUS MV) 200 mcg-15 mcg- 5 mg-1 mg cap Take 1 tablet by mouth in the morning. 30 capsule 0    NON FORMULARY Astralagus, Black elderberry, reishii      ondansetron (ZOFRAN) 4 MG tablet Take 1 tablet (4 mg) by mouth once a day as needed for nausea. 30  tablet 0    selenium 200 mcg Tab Take 1 tablet (200 mcg total) by mouth.      spironolactone (ALDACTONE) 25 MG tablet Take 1 tablet (25 mg total) by mouth daily.      tamsulosin (FLOMAX) 0.4 mg capsule       traMADol (ULTRAM) 50 mg tablet TAKE 1 TO 2 TABLETS BY MOUTH THREE TIMES DAILY AS NEEDED FOR PAIN      valACYclovir (VALTREX) 500 MG tablet Take 1 tablet (500 mg total) by mouth daily. 30 tablet 5    zinc gluconate 50 mg (7 mg elemental zinc) tablet Take 1 tablet (50 mg total) by mouth daily.       No current facility-administered medications for this visit.       Adherence: no barriers identified        Assessment: BrendanDanzer is a 82 y.o. male with IGHV un-mutated, TP53 mutated CLL who started treatment with acalabrutinib + obinutuzumab per [facility]. Mr. Tobolski is tolerating obi/ acalabrutinib well. Plan:   1) CLL  -C1D15 obinutuzumab today, plan for total of 6 cycles  -Continue acalabrutinib 100 mg PO BID  -RTC on 10/03/22 prior to C2 obi start    2) TLS prophylaxis  -Continue allopurinol 300 mg daily    3) Infection prophylaxis  -Continue valacyclovir 500 mg daily    4) Supportive care  -Continue ondansetron 4 mg daily prn nausea    5) Iron deficiency anemia  -Completed ferumoxytol (Feraheme) 510 mg x two doses    F/u:  Future Appointments   Date Time Provider Department Center   09/17/2022 11:00 AM ADULT ONC LAB [facility]   09/17/2022 12:00 PM ONCHEM LEUKEMIA PHARMACIST [id number]   09/17/2022  1:00 PM ONCINF CHAIR 18 [id number]   10/01/2022  8:15 AM ADULT ONC LAB [facility]   10/01/2022  9:00 AM Silvio Pate, DO [id number]     Ronnald Collum, PharmD, BCOP, CPP  Hematology/Oncology Clinical Pharmacist  Pager [phone]      The patient reports they are physically located in [place] and is currently: at home. I conducted a phone visit.  I spent 20 minutes on the phone call with the patient on the date of service .

## 2022-09-18 NOTE — Unmapped (Signed)
19:00 - Assumed care of pt from [facility], pt with feraheme infusing per order and tolerating well.    20:00 - Pt tolerated treatment well with no adverse reaction noted and observed for 30 minutes post feraheme infusion with VSS. PIV with good BR at completion. PIV flushed and removed per protocol. Pt given copy of AVS and Pt ambulated from treatment area with stable gait accompanied by family member.

## 2022-09-18 NOTE — Unmapped (Signed)
RED ZONE Means: RED ZONE: Take action now!     You need to be seen right away  Symptoms are at a severe level of discomfort    Call 911 or go to your nearest  Hospital for help     - Bleeding that will not stop    - Hard to breathe    - New seizure - Chest pain  - Fall or passing out  -Thoughts of hurting    yourself or others      Call 911 if you are going into the RED ZONE                  YELLOW ZONE Means:     Please call with any new or worsening symptom(s), even if not on this list.  Call [phone]  After hours, weekends, and holidays - you will reach a long recording with specific instructions, If not in an emergency such as above, please listen closely all the way to the end and choose the option that relates to your need.   You can be seen by a provider the same day through our Same Day Acute Care for Patients with Cancer program.      YELLOW ZONE: Take action today     Symptoms are new or worsening  You are not within your goal range for:    - Pain    - Shortness of breath    - Bleeding (nose, urine, stool, wound)    - Feeling sick to your stomach and throwing up    - Mouth sores/pain in your mouth or throat    - Hard stool or very loose stools (increase in       ostomy output)    - No urine for 12 hours    - Feeding tube or other catheter/tube issue    - Redness or pain at previous IV or port/catheter site    - Depressed or anxiety   - Swelling (leg, arm, abdomen,     face, neck)  - Skin rash or skin changes  - Wound issues (redness, drainage,    re-opened)  - Confusion  - Vision changes  - Fever >100.4 F or chills  - Worsening cough with mucus that is    green, yellow, or bloody  - Pain or burning when going to the    bathroom  - Home Infusion Pump Issue- call    [phone]         Call your healthcare provider if you are going into the YELLOW ZONE     GREEN ZONE Means:  Your symptoms are under controls  Continue to take your medicine as ordered  Keep all visits to the provider GREEN ZONE: You are in control  No increase or worsening symptoms  Able to take your medicine  Able to drink and eat    - DO NOT use MyChart messages to report red or yellow symptoms. Allow up to 3    business days for a reply.  -MyChart is for non-urgent medication refills, scheduling requests, or other general questions.         [id number] Rev. 12/20/2021  Approved by Oncology Patient Education Committee        Lab on 09/17/2022   Component Date Value Ref Range Status    Sodium 09/17/2022 141  135 - 145 mmol/L Final    Potassium 09/17/2022 4.9  3.5 - 5.1 mmol/L Final    Chloride 09/17/2022 112 (H)  98 -  107 mmol/L Final    CO2 09/17/2022 25.0  20.0 - 31.0 mmol/L Final    Anion Gap 09/17/2022 4 (L)  5 - 14 mmol/L Final    BUN 09/17/2022 32 (H)  9 - 23 mg/dL Final    Creatinine 16/03/9603 1.00  0.73 - 1.18 mg/dL Final    BUN/Creatinine Ratio 09/17/2022 32   Final    eGFR CKD-EPI (2021) Male 09/17/2022 75  >=60 mL/min/1.57m2 Final    eGFR calculated with CKD-EPI 2021 equation in accordance with [employer] and [employer] of [employer]phrology Task Force recommendations.    Glucose 09/17/2022 117  70 - 179 mg/dL Final    Calcium 54/02/8118 9.3  8.7 - 10.4 mg/dL Final    Albumin 14/78/2956 3.1 (L)  3.4 - 5.0 g/dL Final    Total Protein 09/17/2022 5.7  5.7 - 8.2 g/dL Final    Total Bilirubin 09/17/2022 0.3  0.3 - 1.2 mg/dL Final    AST 21/30/8657 31  <=34 U/L Final    ALT 09/17/2022 24  10 - 49 U/L Final    Alkaline Phosphatase 09/17/2022 109  46 - 116 U/L Final    Magnesium 09/17/2022 2.0  1.6 - 2.6 mg/dL Final    Phosphorus 84/69/6295 3.0  2.4 - 5.1 mg/dL Final    LDH 28/41/3244 145  120 - 246 U/L Final    Uric Acid 09/17/2022 4.5  3.7 - 9.2 mg/dL Final    Ferritin 06/25/7251 312.4 (H)  10.5 - 307.3 ng/mL Final    Iron 09/17/2022 51 (L)  65 - 175 ug/dL Final    TIBC 66/44/0347 290  250 - 425 ug/dL Final    Iron Saturation (%) 09/17/2022 18 (L)  20 - 55 % Final    WBC 09/17/2022 15.4 (H)  3.6 - 11.2 10*9/L Final    RBC 09/17/2022 3.58 (L)  4.26 - 5.60 10*12/L Final    HGB 09/17/2022 10.7 (L)  12.9 - 16.5 g/dL Final    HCT 42/59/5638 31.9 (L)  39.0 - 48.0 % Final    MCV 09/17/2022 89.1  77.6 - 95.7 fL Final    MCH 09/17/2022 29.8  25.9 - 32.4 pg Final    MCHC 09/17/2022 33.4  32.0 - 36.0 g/dL Final    RDW 75/64/3329 15.9 (H)  12.2 - 15.2 % Final    MPV 09/17/2022 8.6  6.8 - 10.7 fL Final    Platelet 09/17/2022 146 (L)  150 - 450 10*9/L Final    Neutrophils % 09/17/2022 31.4  % Final    Lymphocytes % 09/17/2022 61.8  % Final    Monocytes % 09/17/2022 3.9  % Final    Eosinophils % 09/17/2022 2.5  % Final    Basophils % 09/17/2022 0.4  % Final    Absolute Neutrophils 09/17/2022 4.8  1.8 - 7.8 10*9/L Final    Absolute Lymphocytes 09/17/2022 9.5 (H)  1.1 - 3.6 10*9/L Final    Absolute Monocytes 09/17/2022 0.6  0.3 - 0.8 10*9/L Final    Absolute Eosinophils 09/17/2022 0.4  0.0 - 0.5 10*9/L Final    Absolute Basophils 09/17/2022 0.1  0.0 - 0.1 10*9/L Final    Smear Review Comments 09/17/2022 See Comment (A)  Undefined Final    Slide reviewed  Smudge Cells present.     ]

## 2022-09-18 NOTE — Unmapped (Signed)
Patient seen today for obintuzumab and feraheme infusions. He tolerated the obintuzumab well with no s/s of adverse reactions. Currently receiving feraheme with no complications. Report given to Delice Bison RN at 1900 to assume care of patient.

## 2022-09-19 ENCOUNTER — Other Ambulatory Visit: Payer: Medicare Other

## 2022-09-19 ENCOUNTER — Ambulatory Visit: Payer: Medicare Other | Admitting: Oncology

## 2022-09-23 NOTE — Unmapped (Signed)
[facility] Clinical Assessment & Refill Coordination Note    Brendan Bishop, DOB: 01-12-1941  Phone: [phone] (home) [phone] (work)    All above HIPAA information was verified with patient.     Was a Nurse, learning disability used for this call? No    Specialty Medication(s):   Hematology/Oncology: Calquence     Current Outpatient Medications   Medication Sig Dispense Refill    acalabrutinib (CALQUENCE) 100 mg tablet Take 1 tablet (100 mg total) by mouth two (2) times a day. Swallow whole with water. Do not to chew, crush, dissolve, or cut tablets. 60 tablet 11    acetaminophen (TYLENOL) 325 MG tablet Take 1.5 tablets (487.5 mg total) by mouth nightly. 100 tablet 2    allopurinol (ZYLOPRIM) 300 MG tablet Take 1 tablet (300 mg total) by mouth daily. 30 tablet 1    ascorbic acid, vitamin C, (VITAMIN C) 1000 MG tablet Take 1 tablet (1,000 mg total) by mouth.      aspirin (ECOTRIN) 81 MG tablet Take 1 tablet (81 mg total) by mouth.      atorvastatin (LIPITOR) 80 MG tablet       b complex vitamins (SUPER B-50 COMPLEX) capsule Take 1 capsule by mouth daily. 30 capsule 11    bimatoprost (LUMIGAN) 0.01 % Drop Administer 1 drop to both eyes nightly. 30 mL 0    cholecalciferol, vitamin D3-250 mcg, 10,000 unit,, 250 mcg (10,000 unit) capsule Take 0.5 capsules (125 mcg total) by mouth daily. 120 capsule 2    coenzyme Q10 200 mg capsule Take 1 capsule (200 mg total) by mouth.      cyanocobalamin, vitamin B-12, 1000 MCG tablet Take 0.5 tablets (500 mcg total) by mouth daily. 30 tablet 0    ENTRESTO 24-26 mg tablet Take 1 tablet by mouth.      ferrous sulfate 325 (65 FE) MG tablet Take 1 tablet (325 mg total) by mouth every other day. 30 tablet 11    fish,bora,flax oils-om3,6,9no1 (OMEGA 3-6-9) 1,200 mg cap Take 1 tablet by mouth in the morning. 30 capsule 0    L.acidophil-L.plantar-Bifido 7 15 billion cell cap Take 5 mg by mouth in the morning. 30 capsule 0    lysine 1,000 mg Tab Take 1,000 mg by mouth.      magnesium gluconate (MAGONATE) 500 mg (27 mg elem magnesium) tablet Take 1 tablet by mouth daily. 30 tablet 0    midodrine (PROAMATINE) 5 MG tablet Take 1 tablet (5 mg total) by mouth Three (3) times a day.      mv-min-FA-vit K-lutein-zeaxant (PRESERVISION AREDS 2 PLUS MV) 200 mcg-15 mcg- 5 mg-1 mg cap Take 1 tablet by mouth in the morning. 30 capsule 0    NON FORMULARY Astralagus, Black elderberry, reishii      ondansetron (ZOFRAN) 4 MG tablet Take 1 tablet (4 mg) by mouth once a day as needed for nausea. 30 tablet 0    selenium 200 mcg Tab Take 1 tablet (200 mcg total) by mouth.      spironolactone (ALDACTONE) 25 MG tablet Take 1 tablet (25 mg total) by mouth daily.      tamsulosin (FLOMAX) 0.4 mg capsule       traMADol (ULTRAM) 50 mg tablet TAKE 1 TO 2 TABLETS BY MOUTH THREE TIMES DAILY AS NEEDED FOR PAIN      valACYclovir (VALTREX) 500 MG tablet Take 1 tablet (500 mg total) by mouth daily. 30 tablet 5    zinc gluconate 50  mg (7 mg elemental zinc) tablet Take 1 tablet (50 mg total) by mouth daily.       No current facility-administered medications for this visit.        Changes to medications: [name] reports no changes at this time.    Allergies   Allergen Reactions    Amoxicillin Hives, Nausea Only and Other (See Comments)     GI upset       Changes to allergies: No    SPECIALTY MEDICATION ADHERENCE     Calquence 100 mg: 12-13 days of medicine on hand     Medication Adherence    Patient reported X missed doses in the last month: 0  Specialty Medication: Calquence 100 mg twice daily  Patient is on additional specialty medications: No  Informant: patient  Confirmed plan for next specialty medication refill: delivery by pharmacy  Refills needed for supportive medications: not needed          Specialty medication(s) dose(s) confirmed: Regimen is correct and unchanged.     Are there any concerns with adherence? No    Adherence counseling provided? Not needed    CLINICAL MANAGEMENT AND INTERVENTION Clinical Benefit Assessment:    Do you feel the medicine is effective or helping your condition? Yes    Clinical Benefit counseling provided? Not needed    Adverse Effects Assessment:    Are you experiencing any side effects? No    Are you experiencing difficulty administering your medicine? No    Quality of Life Assessment:    Quality of Life    Rheumatology  Oncology  1. What impact has your specialty medication had on the reduction of your daily pain or discomfort level?: Some  2. On a scale of 1-10, how would you rate your ability to manage side effects associated with your specialty medication? (1=no issues, 10 = unable to take medication due to side effects): 1  Dermatology  Cystic Fibrosis          How many days over the past month did your CLL  keep you from your normal activities? For example, brushing your teeth or getting up in the morning. 0    Have you discussed this with your provider? Not needed    Acute Infection Status:    Acute infections noted within Epic:  No active infections  Patient reported infection: None    Therapy Appropriateness:    Is therapy appropriate and patient progressing towards therapeutic goals? Yes, therapy is appropriate and should be continued    DISEASE/MEDICATION-SPECIFIC INFORMATION      N/A    Oncology: Is the patient receiving adequate infection prevention treatment? Not applicable  Does the patient have adequate nutritional support? Not applicable    PATIENT SPECIFIC NEEDS     Does the patient have any physical, cognitive, or cultural barriers? No    Is the patient high risk? No    Did the patient require a clinical intervention? No    Does the patient require physician intervention or other additional services (i.e., nutrition, smoking cessation, social work)? No    SOCIAL DETERMINANTS OF HEALTH     At the [facility], we have learned that life circumstances - like trouble affording food, housing, utilities, or transportation can affect the health of many of our patients.   That is why we wanted to ask: are you currently experiencing any life circumstances that are negatively impacting your health and/or quality of life? Patient declined to answer  Social Determinants of Health     Financial Resource Strain: Not on file   Internet Connectivity: Not on file   Food Insecurity: Not on file   Tobacco Use: Low Risk  (09/03/2022)    Patient History     Smoking Tobacco Use: Never     Smokeless Tobacco Use: Never     Passive Exposure: Not on file   Recent Concern: Tobacco Use - Medium Risk (08/15/2022)    Received from [facility]    Patient History     Smoking Tobacco Use: Former     Smokeless Tobacco Use: Never     Passive Exposure: Not on file   Housing/Utilities: Not on file   Alcohol Use: Unknown (09/29/2019)    Received from [facility]    AUDIT-C     Frequency of Alcohol Consumption: 2-3 times a week     Average Number of Drinks: Not on file     Frequency of Binge Drinking: Not on file   Transportation Needs: Not on file   Substance Use: Not on file   Health Literacy: Not on file   Physical Activity: Not on file   Interpersonal Safety: Not on file   Stress: Not on file   Intimate Partner Violence: Not on file   Depression: Not at risk (11/19/2021)    Received from [facility]    PHQ-2     (OBSOLETE) Total Prescreening Score: 0   Social Connections: Not on file     Would you be willing to receive help with any of the needs that you have identified today? Not applicable       SHIPPING     Specialty Medication(s) to be Shipped:   Hematology/Oncology: Calquence    Other medication(s) to be shipped: No additional medications requested for fill at this time     Changes to insurance: No    Patient was informed of new phone menu: No    Delivery Scheduled: Yes, Expected medication delivery date: 08/30/22.     Medication will be delivered via Next Day Courier to the confirmed prescription address in [facility].    The patient will receive a drug information handout for each medication shipped and additional FDA Medication Guides as required.  Verified that patient has previously received a Conservation officer, historic buildings and a Surveyor, mining.    The patient or caregiver noted above participated in the development of this care plan and knows that they can request review of or adjustments to the care plan at any time.      All of the patient's questions and concerns have been addressed.    Kermit Balo, [facility]   [facility] Specialty Pharmacist

## 2022-09-29 MED FILL — CALQUENCE (ACALABRUTINIB MALEATE) 100 MG TABLET: ORAL | 30 days supply | Qty: 60 | Fill #1

## 2022-10-01 NOTE — Unmapped (Addendum)
Clinic notes faxed and patient aware of the need for EGD/colonoscopy.       ----- Message from Silvio Pate, DO sent at 09/12/2022  4:38 PM EDT -----  Would you be able to send my last note to his clinic?  [name] needs to be scheduled for an EGD and colonoscopy.  Please let [name] know that we are sending my recommendations, but he needs to call and get scheduled as soon as the doctor is available.  Thanks  ----- Message -----  From: Karie Georges, RN  Sent: 09/12/2022  11:43 AM EDT  To: Silvio Pate, DO    Hi:    I received the below message from [name]:    [name]:  Dr. Zonia Kief needed the name of the GI doctor. He is Ruffin Bradshaw, MD, [facility], [place], [place] [phone]. He last did an endoscopic procedure on 06/30/2016.      Let me know if you need anything,    Baxter Hire

## 2022-10-02 NOTE — Unmapped (Signed)
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 male with a history of multiple cancers, who presents with progressive lymphocytosis due to his lymphoproliferative disorder and consultation for treatment recommendations.    #Biphenotypic B-cell lymphoproliferative disorder.  -He has 2 distinct abnormal monoclonal B-cell populations.  One is classic for CLL with CD5+ and dim CD20 and kappa restricted.  The other is more consistent with marginal zone lymphoma which is CD5-/CD10-/CD20+ and lambda restricted.  As all pathology has been equal distribution of both cell types, it is unclear which or if both are contributing equally to the progressive lymphocytosis.  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  -Due to rapidly progressive lymphocytosis and progressive lymphadenopathy and anemia, he meets criteria for treatment.  -Interestingly, his peripheral blood flow from 07/23/22 showed 60% of the CD5- population and 10% of the CD5+ population, while the bone marrow biopsy from 08/06/22 demonstrated 90% of the CD5+ (although both populations were detected by I[facility] sequencing)  -Previously discussed potential treatment options, but the best option that would be effective against both clones is the combination of obinutuzumab and acalabrutinib as given for frontline CLL therapy in the [facility] [place] study. Obinutuzumab would be given for 6 months and acalabrutinib would be given continuously.  -Potential side effects include but are not limited to infusion reaction, infection, neutropenia, diarrhea, nausea, heartburn, joint or muscle aches, bleeding, bruising, atrial fibrillation, hypertension, and headache.  -He presented to initiate therapy with obinutuzumab and acalabrutinib 09/03/22.   -His labs are appropriate to proceed with treatment today. Proceed with C2.    #Anemia.  -Likely related to B-cell malignancy as above.  -Also iron deficient.  Recommended increase iron supplementation to ferrous sulfate 325mg  every other day.  Recheck today 09/03/22 with continued low Tsat at 14% and Ferritin at 40.  Hgb down to 10 today.  He is not responding to oral supplementation, so will start authorization of IV iron.  Of note, he requests this infusion in [place].  -He had a colonoscopy in 2022 and said that he had polyps and would have to return in 2027 for next one.  Requested he speak with his GI doctor about repeating the colonoscopy earlier to evaluate for occult bleeding.  -Also of note, he previously had a bladder tumor that was causing bleeding.  He is due to see urologist in April for repeat cystoscopy.  I have ordered urinalysis today to evaluate for occult bleeding from bladder.  -We have reviewed with him that acalabrutinib can worsen bleeding and if he notices any new bleeding, he knows to contact us immediately.    #Dysphagia to solids.  -Previously needed an esophageal dilation.  -Requested that he contact his GI doctor to schedule EGD (and colonoscopy as above.)    #Recent COVID19 infection.  -He has clinically recovered.    #  Multiple medical comorbidities.  -Managed by PCP and other specialists.    #Rash.  New eruption involving left > right side of back and right leg. No clear vesicles are noted, and the rash crosses the midline. Will prescribe topical steroid for possible drug-related eruption. Advised patient to alert [place] to worsening or evolution of rash.    #Skin excoriation left temple.  -Recommended dermatology evaluation as this is suspicious for SCC.  He has a Ship broker who he will contact.    #. Preventive  Cancer screening:  Patients with CLL/SLL are at increased risk of developing secondary malignancies.  Patient is not currently due for screening.  Annual dermatology evaluation, due to increased risk of non-melanomatous skin cancers.   If needed, blood products for transfusion should be irradiated.  Risk of Hypogammaglobulinemia: Baseline IgG levels 03/11/19 = 698, 07/23/22 1095  Vaccination  Avoid all live vaccines.  Recommend annual influenza vaccine.  Last given: 05/28/22  Recommend COVID vaccine series and booster.  Last given:  04/13/20  Recommend pneumococcal vaccine with Prevnar 20.  Will need to receive this 6 months after completion of obinutuzumab.  Recommend RSV vaccine. Last given:  07/18/22  Recommend Zoster vaccine recombinant, adjuvanted (Shingrix).  Last given: 01/18/13 (chart lists as zostavax, will need to confirm)    -RTC in one month for cycle 3    In total I spent 40 minutes in this visit which consisted of collecting a history and making medical management decisions for the diagnoses listed in my note, as well as reviewing and updating the patient's medical record, coordinating care, and documenting before, during and after the visit.     Laurian Brim, MD  Division of Hematology      Chief Complaint:     Chief Complaint   Patient presents with    Routine Follow-up     Patient has rash on his back        Oncology/Treatment History:     Hematology/Oncology History   CLL (chronic lymphocytic leukemia) (CMS-HCC)   05/21/2021 Initial Diagnosis    CLL (chronic lymphocytic leukemia) (CMS-HCC)  -While he was officially diagnosed in 2022, he had a mild lymphocytosis dating back to 2017, when he was undergoing radiation for his prostate cancer.  On 02/08/2016, he had a flow cytometry, which showed a small population of monoclonal B-cells that were CD5-/CD10- with a total of 2.9k/uL clonal cells.  Observation was recommended.   -further evaluation of asymptomatic lymphocytosis ensued 05/21/2021.  -WBC 20.1, Hgb 12.5, Plt 238, ALC 13.8, ANC 3.96, B2M Not done, LDH 116 (ULN 192)  -Flow showed monoclonal B-cell population consistent with CLL with absolute clonal count of 3.74 k/UL, however a separate population of CD5-/CD10- monoclonal B-cells was detected  -Rai Stage: 1  -IGHV: Unmutated  -FISH showed: Normal  -Karyotype showed: Not completed.  -Recommendation: observation       07/01/2021 Interval Scan(s)    PET scan with scattered LAD, max size 2.0cm, No hepatosplenomegaly.     07/05/2021 Biopsy    Bone marrow biopsy showed hypercellular bone marrow with involvement of a low-grade B-cell lymphoproliferative disorder.  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 Ki-67 stain is variable in the interfollicular areas, 10% to 30%.   There are two separate B cell clones, in roughly equal proportions:   1) Kappa light chain restricted (dim expression), CD20+ (dim), CD5+, CD23+,  CD43+, FMC-   2) Lambda light chain restricted, CD20+, CD5-, CD23+, CD43-, FMC+(dim).      06/26/2022 Interval Scan(s)    PET scan with continued LAD with mild progression.  Largest node 2.4cm in mesentery.     07/23/2022 Molecular Markers    -FISH showed: Normal  -Karyotype showed: 46,XY,del(7)(q31q32)[14]/46,XY[6]   -TP53 mutation was detected c.375G>A      08/05/2022 Biopsy    Bone marrow biopsy:  -  Hypercellular bone marrow (90%) primarily involved by CD5+ B-cell lymphoma/leukemia, consistent with chronic lymphocytic leukemia, representing approximately 70% of marrow cellularity by immunohistochemistry (see Comment)  -  Clonal immunoglobulin heavy (IGH) and kappa light chain (IGK) gene rearrangements identified (see Comment)            History Obtained From:   Patient and his significant other.    History of Present Illness:   Brendan Bishop is a 82 y.o. male with a history of multiple cancers, who presents with progressive lymphocytosis due to his lymphoproliferative disorder and consultation for treatment recommendations.  For detailed oncologic and treatment (if applicable) history, please see above.     Interim History   Since his last visit, Brendan Bishop reports he has been feeling surprisingly well. His primary symptomatic complaint is the development of a rash on his back and legs. He noted several days of a red, minimally raised rash, which is worst on the left side of his back though it also involves the right side of his back and right leg.  This has been present for about the last week.    He denies new constitutional symptoms such as anorexia, weight loss, fatigue, night sweats or unexplained fevers.  Furthermore, he denies symptoms of marrow failure: unexplained bleeding or bruising, recurrent or unexplained intercurrent infections, dyspnea on exertion, lightheadedness, palpitations or chest pain.  There have been no new or unexplained pains or self-identified masses, swelling or enlarged lymph nodes.       Review of Systems   Complete ROS performed and was negative except what is noted in interim history above.    Past Medical History     Past Medical History:   Diagnosis Date    Bladder cancer (CMS-HCC) 09/13/2021    CAD (coronary artery disease)     CLL (chronic lymphocytic leukemia) (CMS-HCC)     GERD (gastroesophageal reflux disease)     Hyperlipidemia     Hypertension     Marginal zone B-cell lymphoma (CMS-HCC)     Multinodular goiter     OSA (obstructive sleep apnea)     Prostate cancer (CMS-HCC) 10/2015    Stroke (CMS-HCC) 2011       Past Surgical History:     Past Surgical History:   Procedure Laterality Date    BIOPSY OF THYROID [facility] HISTORICAL RESULT)      BONE MARROW BIOPSY      CARPAL TUNNEL RELEASE      CATARACT EXTRACTION Bilateral     CORONARY ARTERY BYPASS GRAFT  2005    HEMORRHOID SURGERY  2002    LYMPH NODE BIOPSY  07/08/2021    PAROTIDECTOMY Left 08/04/2017    TONSILLECTOMY AND ADENOIDECTOMY      TOTAL KNEE ARTHROPLASTY Left 2014    TOTAL KNEE ARTHROPLASTY Right 05/17/2018    TRANSURETHRAL RESECTION OF BLADDER TUMOR  09/13/2021       Allergies:     No Known Allergies      Family History:     Family History  Problem Relation Age of Onset    Heart disease Mother     Parkinsonism Father     Alzheimer's disease Father     Diabetes Paternal Grandfather        Social History:     Social History     Tobacco Use    Smoking status: Never    Smokeless tobacco: Never   Vaping Use    Vaping status: Never Used   Substance Use Topics    Alcohol use: Not Currently    Drug use: Never       Medications:     Current Outpatient Medications   Medication Sig Dispense Refill    acalabrutinib (CALQUENCE) 100 mg tablet Take 1 tablet (100 mg total) by mouth two (2) times a day. Swallow whole with water. Do not to chew, crush, dissolve, or cut tablets. 60 tablet 11    acetaminophen (TYLENOL) 325 MG tablet Take 1.5 tablets (487.5 mg total) by mouth nightly. 100 tablet 2    allopurinol (ZYLOPRIM) 300 MG tablet Take 1 tablet (300 mg total) by mouth daily. 30 tablet 1    ascorbic acid, vitamin C, (VITAMIN C) 1000 MG tablet Take 1 tablet (1,000 mg total) by mouth.      aspirin (ECOTRIN) 81 MG tablet Take 1 tablet (81 mg total) by mouth.      atorvastatin (LIPITOR) 80 MG tablet       b complex vitamins (SUPER B-50 COMPLEX) capsule Take 1 capsule by mouth daily. 30 capsule 11    bimatoprost (LUMIGAN) 0.01 % Drop Administer 1 drop to both eyes nightly. 30 mL 0    cholecalciferol, vitamin D3-250 mcg, 10,000 unit,, 250 mcg (10,000 unit) capsule Take 0.5 capsules (125 mcg total) by mouth daily. 120 capsule 2    coenzyme Q10 200 mg capsule Take 1 capsule (200 mg total) by mouth.      cyanocobalamin, vitamin B-12, 1000 MCG tablet Take 0.5 tablets (500 mcg total) by mouth daily. 30 tablet 0    ENTRESTO 24-26 mg tablet Take 1 tablet by mouth.      ferrous sulfate 325 (65 FE) MG tablet Take 1 tablet (325 mg total) by mouth every other day. 30 tablet 11    fish,bora,flax oils-om3,6,9no1 (OMEGA 3-6-9) 1,200 mg cap Take 1 tablet by mouth in the morning. 30 capsule 0    L.acidophil-L.plantar-Bifido 7 15 billion cell cap Take 5 mg by mouth in the morning. 30 capsule 0    lysine 1,000 mg Tab Take 1,000 mg by mouth.      magnesium gluconate (MAGONATE) 500 mg (27 mg elem magnesium) tablet Take 1 tablet by mouth daily. 30 tablet 0    midodrine (PROAMATINE) 5 MG tablet Take 1 tablet (5 mg total) by mouth Three (3) times a day.      mv-min-FA-vit K-lutein-zeaxant (PRESERVISION AREDS 2 PLUS MV) 200 mcg-15 mcg- 5 mg-1 mg cap Take 1 tablet by mouth in the morning. 30 capsule 0    NON FORMULARY Astralagus, Black elderberry, reishii      ondansetron (ZOFRAN) 4 MG tablet Take 1 tablet (4 mg) by mouth once a day as needed for nausea. 30 tablet 0    selenium 200 mcg Tab Take 1 tablet (200 mcg total) by mouth.      spironolactone (ALDACTONE) 25 MG tablet Take 1 tablet (25 mg total) by mouth daily.      tamsulosin (FLOMAX) 0.4 mg capsule       traMADol (ULTRAM) 50 mg tablet  TAKE 1 TO 2 TABLETS BY MOUTH THREE TIMES DAILY AS NEEDED FOR PAIN      valACYclovir (VALTREX) 500 MG tablet Take 1 tablet (500 mg total) by mouth daily. 30 tablet 5    zinc gluconate 50 mg (7 mg elemental zinc) tablet Take 1 tablet (50 mg total) by mouth daily.      triamcinolone (KENALOG) 0.1 % ointment Apply topically two (2) times a day. 30 g 0     No current facility-administered medications for this visit.       Physical Exam:     PHYSICAL EXAMINATION:  ECOG: 2   VITAL SIGNS:  BP (S) 82/40 Comment: BP Recheck, Previous BP: 77/49, Left Arm - Pulse 67  - Temp 36.8 ??C (98.2 ??F) (Oral)  - Resp 18  - Ht 167.6 cm (5' 5.98)  - Wt 77.7 kg (171 lb 4.8 oz)  - SpO2 97%  - BMI 27.66 kg/m??     General: well appearing, in no acute distress  HEENT: Sclera anicteric Conjunctivae not injected  Resp: Normal WOB, symmetric excursion, CTAB  CV: RRR without murmurs, rubs or gallops.    GI: Normal bowel sounds, abdomen soft, non-tender, non-distended  Skin: Rash as noted below, involving R > left side of back, with similar involvement on right shin.  Neuro: Alert and oriented to conversation, EOM intact, no facial droop, speech fluent and coherent, moves all extremities without asymmetry and with at least antigravity strength  Psych: Normal affect  Extremities: No clubbing or lower extremity edema appreciated.   LYMPH: No palpable cervical, supraclavicular, inguinal lymphadenopathy. Right > left axillary LAD up to 2cm.        Labs:   See oncology history      Pathology:   See oncology history    Imaging:   I personally reviewed all of the patient's pertinent images and documented any relevant findings in the assessment/plan.  See outside records.    No new pertinent imaging today.

## 2022-10-03 ENCOUNTER — Other Ambulatory Visit: Admit: 2022-10-03 | Discharge: 2022-10-04 | Payer: MEDICARE

## 2022-10-03 ENCOUNTER — Ambulatory Visit: Admit: 2022-10-03 | Discharge: 2022-10-04 | Payer: MEDICARE

## 2022-10-03 DIAGNOSIS — R21 Rash and other nonspecific skin eruption: Principal | ICD-10-CM

## 2022-10-03 DIAGNOSIS — C8580 Other specified types of non-Hodgkin lymphoma, unspecified site: Principal | ICD-10-CM

## 2022-10-03 DIAGNOSIS — C9510 Chronic leukemia of unspecified cell type not having achieved remission: Principal | ICD-10-CM

## 2022-10-03 DIAGNOSIS — Z5111 Encounter for antineoplastic chemotherapy: Principal | ICD-10-CM

## 2022-10-03 DIAGNOSIS — C9110 Chronic lymphocytic leukemia of B-cell type not having achieved remission: Principal | ICD-10-CM

## 2022-10-03 LAB — CBC W/ AUTO DIFF
BASOPHILS ABSOLUTE COUNT: 0 10*9/L (ref 0.0–0.1)
BASOPHILS RELATIVE PERCENT: 0.3 %
EOSINOPHILS ABSOLUTE COUNT: 0.2 10*9/L (ref 0.0–0.5)
EOSINOPHILS RELATIVE PERCENT: 1.3 %
HEMATOCRIT: 34.9 % — ABNORMAL LOW (ref 39.0–48.0)
HEMOGLOBIN: 11.5 g/dL — ABNORMAL LOW (ref 12.9–16.5)
LYMPHOCYTES ABSOLUTE COUNT: 9.4 10*9/L — ABNORMAL HIGH (ref 1.1–3.6)
LYMPHOCYTES RELATIVE PERCENT: 56.7 %
MEAN CORPUSCULAR HEMOGLOBIN CONC: 33 g/dL (ref 32.0–36.0)
MEAN CORPUSCULAR HEMOGLOBIN: 30.9 pg (ref 25.9–32.4)
MEAN CORPUSCULAR VOLUME: 93.6 fL (ref 77.6–95.7)
MEAN PLATELET VOLUME: 8.8 fL (ref 6.8–10.7)
MONOCYTES ABSOLUTE COUNT: 0.6 10*9/L (ref 0.3–0.8)
MONOCYTES RELATIVE PERCENT: 3.7 %
NEUTROPHILS ABSOLUTE COUNT: 6.3 10*9/L (ref 1.8–7.8)
NEUTROPHILS RELATIVE PERCENT: 38 %
PLATELET COUNT: 146 10*9/L — ABNORMAL LOW (ref 150–450)
RED BLOOD CELL COUNT: 3.72 10*12/L — ABNORMAL LOW (ref 4.26–5.60)
RED CELL DISTRIBUTION WIDTH: 18.9 % — ABNORMAL HIGH (ref 12.2–15.2)
WBC ADJUSTED: 16.7 10*9/L — ABNORMAL HIGH (ref 3.6–11.2)

## 2022-10-03 LAB — COMPREHENSIVE METABOLIC PANEL
ALBUMIN: 3.2 g/dL — ABNORMAL LOW (ref 3.4–5.0)
ALKALINE PHOSPHATASE: 93 U/L (ref 46–116)
ALT (SGPT): 22 U/L (ref 10–49)
ANION GAP: 3 mmol/L — ABNORMAL LOW (ref 5–14)
AST (SGOT): 27 U/L (ref <=34)
BILIRUBIN TOTAL: 0.5 mg/dL (ref 0.3–1.2)
BLOOD UREA NITROGEN: 25 mg/dL — ABNORMAL HIGH (ref 9–23)
BUN / CREAT RATIO: 27
CALCIUM: 9.7 mg/dL (ref 8.7–10.4)
CHLORIDE: 110 mmol/L — ABNORMAL HIGH (ref 98–107)
CO2: 26 mmol/L (ref 20.0–31.0)
CREATININE: 0.91 mg/dL
EGFR CKD-EPI (2021) MALE: 84 mL/min/{1.73_m2} (ref >=60)
GLUCOSE RANDOM: 133 mg/dL (ref 70–179)
POTASSIUM: 4.4 mmol/L (ref 3.4–4.8)
PROTEIN TOTAL: 5.8 g/dL (ref 5.7–8.2)
SODIUM: 139 mmol/L (ref 135–145)

## 2022-10-03 LAB — LACTATE DEHYDROGENASE: LACTATE DEHYDROGENASE: 157 U/L (ref 120–246)

## 2022-10-03 LAB — SLIDE REVIEW

## 2022-10-03 LAB — MAGNESIUM: MAGNESIUM: 1.9 mg/dL (ref 1.6–2.6)

## 2022-10-03 LAB — URIC ACID: URIC ACID: 4 mg/dL

## 2022-10-03 LAB — PHOSPHORUS: PHOSPHORUS: 2.5 mg/dL (ref 2.4–5.1)

## 2022-10-03 MED ORDER — TRIAMCINOLONE ACETONIDE 0.1 % TOPICAL OINTMENT
Freq: Two times a day (BID) | TOPICAL | 0 refills | 0 days | Status: CP
Start: 2022-10-03 — End: 2023-10-03

## 2022-10-03 MED ADMIN — sodium chloride (NS) 0.9 % infusion: 100 mL/h | INTRAVENOUS | @ 20:00:00

## 2022-10-03 MED ADMIN — cetirizine (ZYRTEC) tablet 10 mg: 10 mg | ORAL | @ 19:00:00 | Stop: 2022-10-03

## 2022-10-03 MED ADMIN — acetaminophen (TYLENOL) tablet 650 mg: 650 mg | ORAL | @ 18:00:00 | Stop: 2022-10-03

## 2022-10-03 MED ADMIN — dexAMETHasone (DECADRON) tablet 20 mg: 20 mg | ORAL | @ 18:00:00 | Stop: 2022-10-03

## 2022-10-03 MED ADMIN — obinutuzumab 1,000 mg in sodium chloride (NS) 0.9 % 250 mL IVPB: 1000 mg | INTRAVENOUS | @ 20:00:00 | Stop: 2022-10-03

## 2022-10-03 MED ADMIN — ferumoxytol (FERAHEME) 510 mg in sodium chloride (NS) 0.9 % 100 mL IVPB: 510 mg | INTRAVENOUS | @ 20:00:00 | Stop: 2022-10-03

## 2022-10-03 MED ADMIN — diphenhydrAMINE (BENADRYL) capsule 50 mg: 50 mg | ORAL | @ 18:00:00 | Stop: 2022-10-03

## 2022-10-03 NOTE — Unmapped (Cosign Needed)
Informed Consent Documentation  Study: [facility] 1850     I spoke with patient Brendan Bishop  to discuss consent for the [facility] 1850 trial.  The protocol was reviewed including discussion of risks & benefits, confidentiality, time commitments involved, study contact list, and the option to withdraw at any time. Alternatives to study participation were discussed.  The patient was given reasonable time to consider participation in the study, in the absence of coercion or undue influence. Patient was offered an opportunity for questions and these questions were answered.  Patient verbalized understanding of information presented. The patient did not complete the SSN Collection e-Consent form as he did not feel comfortable providing that information.    The patient has signed the study informed consent and HIPAA Authorization Form as follows:  X     Remotely  A copy of the signed informed consent and HIPAA Authorization Form was given to the patient.  The informed consent and HIPAA Authorization Form will be placed in the regulatory files in the Office of Clinical & Translational Research as well as scanned into [employer]. Every effort to maintain confidentiality will be employed.        Date: 10/03/2022  X     HIPAA Form Signature:  Same as above       Plan: Patient to take part in up to 7 study visits during established chemo treatments for their Hodgkin or non-Hodgkin lymphoma diagnosis, at timepoints at enrollment, mid cycle, end of treatment, 6 mo post treatment, 12 mo post treatment, and 24 mo post treatment. Study visits include various assessments some of which will be sent electronically and others will be completed in person.     OPTIONAL: EDTA (lavender) top tube of blood drawn (~10 ml total).  Blood will be collected at all time points EXCEPT midcycle.  Yes. Patient accepted/declined optional blood draw.      Inclusion Criteria (ref where we get info)  51.72 years of age or older (Yes/10-07-2039)  2.Diagnosed with either non-Hodgkin or Hodgkin lymphoma (Yes per path report or progress note 10/03/22)  3.English speaking (Yes per demographics page)  4.Patients has an outpatient appointment or being seen at [facility] for treatment of lymphoma (Yes)

## 2022-10-04 NOTE — Unmapped (Signed)
Lab on 10/03/2022   Component Date Value Ref Range Status    Sodium 10/03/2022 139  135 - 145 mmol/L Final    Potassium 10/03/2022 4.4  3.4 - 4.8 mmol/L Final    Chloride 10/03/2022 110 (H)  98 - 107 mmol/L Final    CO2 10/03/2022 26.0  20.0 - 31.0 mmol/L Final    Anion Gap 10/03/2022 3 (L)  5 - 14 mmol/L Final    BUN 10/03/2022 25 (H)  9 - 23 mg/dL Final    Creatinine 28/41/3244 0.91  0.73 - 1.18 mg/dL Final    BUN/Creatinine Ratio 10/03/2022 27   Final    eGFR CKD-EPI (2021) Male 10/03/2022 84  >=60 mL/min/1.2m2 Final    eGFR calculated with CKD-EPI 2021 equation in accordance with [employer] and [employer] of Nephrology Task Force recommendations.    Glucose 10/03/2022 133  70 - 179 mg/dL Final    Calcium 06/25/7251 9.7  8.7 - 10.4 mg/dL Final    Albumin 66/44/0347 3.2 (L)  3.4 - 5.0 g/dL Final    Total Protein 10/03/2022 5.8  5.7 - 8.2 g/dL Final    Total Bilirubin 10/03/2022 0.5  0.3 - 1.2 mg/dL Final    AST 42/59/5638 27  <=34 U/L Final    ALT 10/03/2022 22  10 - 49 U/L Final    Alkaline Phosphatase 10/03/2022 93  46 - 116 U/L Final    Magnesium 10/03/2022 1.9  1.6 - 2.6 mg/dL Final    Phosphorus 75/64/3329 2.5  2.4 - 5.1 mg/dL Final    LDH 51/88/4166 157  120 - 246 U/L Final    Uric Acid 10/03/2022 4.0  3.7 - 9.2 mg/dL Final    WBC 12/21/1599 16.7 (H)  3.6 - 11.2 10*9/L Final    RBC 10/03/2022 3.72 (L)  4.26 - 5.60 10*12/L Final    HGB 10/03/2022 11.5 (L)  12.9 - 16.5 g/dL Final    HCT 09/32/3557 34.9 (L)  39.0 - 48.0 % Final    MCV 10/03/2022 93.6  77.6 - 95.7 fL Final    MCH 10/03/2022 30.9  25.9 - 32.4 pg Final    MCHC 10/03/2022 33.0  32.0 - 36.0 g/dL Final    RDW 32/20/2542 18.9 (H)  12.2 - 15.2 % Final    MPV 10/03/2022 8.8  6.8 - 10.7 fL Final    Platelet 10/03/2022 146 (L)  150 - 450 10*9/L Final    Neutrophils % 10/03/2022 38.0  % Final    Lymphocytes % 10/03/2022 56.7  % Final    Monocytes % 10/03/2022 3.7  % Final    Eosinophils % 10/03/2022 1.3  % Final    Basophils % 10/03/2022 0.3  % Final    Absolute Neutrophils 10/03/2022 6.3  1.8 - 7.8 10*9/L Final    Absolute Lymphocytes 10/03/2022 9.4 (H)  1.1 - 3.6 10*9/L Final    Absolute Monocytes 10/03/2022 0.6  0.3 - 0.8 10*9/L Final    Absolute Eosinophils 10/03/2022 0.2  0.0 - 0.5 10*9/L Final    Absolute Basophils 10/03/2022 0.0  0.0 - 0.1 10*9/L Final    Anisocytosis 10/03/2022 Slight (A)  Not Present Final    Smear Review Comments 10/03/2022 See Comment (A)  Undefined Final    Slide [id number] reviewed, albumin slide [id number] reviewed

## 2022-10-04 NOTE — Unmapped (Signed)
Patient arrived to chair 17. Labs within parameter for tx today. PIV was flushed, + BR. Premeds given. Pt. completed and tolerated Feraheme infusion well. Pt currently tolerating obinutuzumab well. Report given to Jerrye Bushy., RN for transfer of care.

## 2022-10-04 NOTE — Unmapped (Signed)
Received verbal report from [facility]. Patient is currently infusing on Obinituzumab at a rate of 300 mg/hr, tolerating well, resting, no distress noted. PIV intact. Patient completed infusion with no adverse events noted, PIV flushed/removed, discharged in stable condition.

## 2022-10-06 NOTE — Unmapped (Signed)
I spoke with patient Brendan Bishop to confirm appointments on the following date(s): spoke with Ms. Junious Dresser, dom. Partner about the change in his appt on 05/10 has been move to 05/08 at the same time. She states that she will give him the message...04/15    [name]

## 2022-10-07 ENCOUNTER — Other Ambulatory Visit: Payer: Medicare Other | Admitting: Urology

## 2022-10-13 MED ORDER — VALACYCLOVIR 500 MG TABLET
ORAL_TABLET | Freq: Every day | ORAL | 5 refills | 30 days | Status: CP
Start: 2022-10-13 — End: ?
  Filled 2022-10-27: qty 30, 30d supply, fill #0

## 2022-10-22 ENCOUNTER — Ambulatory Visit: Payer: Medicare Other | Admitting: Urology

## 2022-10-22 ENCOUNTER — Encounter: Payer: Self-pay | Admitting: Urology

## 2022-10-22 VITALS — BP 101/63 | HR 101 | Ht 66.0 in | Wt 167.0 lb

## 2022-10-22 DIAGNOSIS — Z8551 Personal history of malignant neoplasm of bladder: Secondary | ICD-10-CM | POA: Diagnosis not present

## 2022-10-22 DIAGNOSIS — C679 Malignant neoplasm of bladder, unspecified: Secondary | ICD-10-CM

## 2022-10-22 MED ORDER — TAMSULOSIN HCL 0.4 MG PO CAPS: 0.4000 mg | ORAL_CAPSULE | Freq: Every day | ORAL | 11 refills | Status: DC

## 2022-10-22 NOTE — Progress Notes (Signed)
Cystoscopy Procedure Note:  Indication: History of low-grade noninvasive bladder cancer  82 year old male who presented with gross hematuria and was found to have a small bladder tumor.  Underwent uncomplicated bladder biopsy and fulguration of 2 cm lateral wall tumor on 09/13/2021 with postop gemcitabine.  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  Keflex given for prophylaxis  After informed consent and discussion of the procedure and its risks, Wesley [name] was positioned and prepped in the standard fashion. Cystoscopy was performed with a flexible cystoscope. The urethra, bladder neck and entire bladder was visualized in a standard fashion. The prostate was moderate in size. The ureteral orifices were visualized in their normal location and orientation.  Bladder mucosa grossly normal throughout, no suspicious lesions, no abnormalities on retroflexion.  Findings: No evidence of recurrence  Assessment and Plan: RTC 9 months cystoscopy  He also reports worsening urinary symptoms with weak stream and nocturia, as well as frequency.  May be related to new diuretic use and multiple chemotherapy medications through hematology, I also recommended resuming the Flomax.  He will contact us via MyChart if no significant improvement on the Flomax  Legrand Rams, MD 10/22/2022

## 2022-10-23 NOTE — Unmapped (Signed)
[facility] Refill Coordination Note    Brendan Bishop, DOB: 03-27-1941  Phone: [phone] (home) [phone] (work)      All above HIPAA information was verified with patient.         10/23/2022     3:58 PM   Specialty Rx Medication Refill Questionnaire   Which Medications would you like refilled and shipped? Calquence &  valACVclovir  500 mg   Please list all current allergies: none   Have you missed any doses in the last 30 days? No   Have you had any changes to your medication(s) since your last refill? No   How many days remaining of each medication do you have at home? I have at least two weeks of Calquence but I am out of the other medication.   If receiving an injectable medication, next injection date is 10/29/2022   Have you experienced any side effects in the last 30 days? No   Please enter the full address (street address, city, state, zip code) where you would like your medication(s) to be delivered to. [address] [place], [place], [place] [address]   Please specify on which day you would like your medication(s) to arrive. Note: if you need your medication(s) within 3 days, please call the pharmacy to schedule your order at [phone]  10/27/2022   Has your insurance changed since your last refill? No   Would you like a pharmacist to call you to discuss your medication(s)? No   Do you require a signature for your package? (Note: if we are billing Medicare Part B or your order contains a controlled substance, we will require a signature) No         Completed refill call assessment today to schedule patient's medication shipment from the [facility] [phone]).  All relevant notes have been reviewed.       Confirmed patient received a Conservation officer, historic buildings and a Surveyor, mining with first shipment. The patient will receive a drug information handout for each medication shipped and additional FDA Medication Guides as required.         REFERRAL TO PHARMACIST Referral to the pharmacist: Not needed      [facility]     Shipping address confirmed in Epic.     Delivery Scheduled: Yes, Expected medication delivery date: 10/27/2022.     Medication will be delivered via Same Day Courier to the prescription address in Epic WAM.    Dorisann Frames   [facility] Specialty Technician

## 2022-10-27 MED FILL — CALQUENCE (ACALABRUTINIB MALEATE) 100 MG TABLET: ORAL | 30 days supply | Qty: 60 | Fill #2

## 2022-10-27 NOTE — Unmapped (Signed)
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 male with a history of multiple cancers, who presents for follow-up of biphenotypic LPD.    #Biphenotypic B-cell lymphoproliferative disorder.  -He has 2 distinct abnormal monoclonal B-cell populations.  One is classic for CLL with CD5+ and dim CD20 and kappa restricted.  The other is more consistent with marginal zone lymphoma which is CD5-/CD10-/CD20+ and lambda restricted.  As all pathology has been equal distribution of both cell types, it is unclear which or if both are contributing equally to the progressive lymphocytosis.  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  -Due to rapidly progressive lymphocytosis and progressive lymphadenopathy and anemia, he meets criteria for treatment.  -Interestingly, his peripheral blood flow from 07/23/22 showed 60% of the CD5- population and 10% of the CD5+ population, while the bone marrow biopsy from 08/06/22 demonstrated 90% of the CD5+ (although both populations were detected by IGH sequencing)  -Previously discussed potential treatment options, but the best option that would be effective against both clones is the combination of obinutuzumab and acalabrutinib as given for frontline CLL therapy in the [facility] [place] study. Obinutuzumab would be given for 6 months and acalabrutinib would be given continuously.  -Potential side effects include but are not limited to infusion reaction, infection, neutropenia, diarrhea, nausea, heartburn, joint or muscle aches, bleeding, bruising, atrial fibrillation, hypertension, and headache.  -He initiated therapy with obinutuzumab and acalabrutinib 09/03/22.   -His labs are appropriate to proceed with treatment today. Proceed with treatment.                                                   #Anemia.  -Likely related to B-cell malignancy as above.  -Also iron deficient.  Recommended increase iron supplementation to ferrous sulfate 325mg  every other day.  Recheck 09/03/22 with continued low Tsat at 14% and Ferritin at 40.  Hgb down to 10.  He received IV iron on 10/03/22.  There is mild improvement in Fe panel today. We will continue with PO supplementation.   -He had a colonoscopy in 2022 and said that he had polyps and would have to return in 2027 for next one.  He has an initiation consultation visit on 6/13 with Dr. Adela Lank at [facility] in [place] for EGD/colonoscopy.  Request records when available.  -Also of note, he previously had a bladder tumor that was causing bleeding.  He underwent repeat cystoscopy on 10/22/22 with Dr. Legrand Rams.  No evidence of recurrent disease.  He has requested repeat cystoscopy in 9 months, which should be ~2/25.  -We have reviewed with him that acalabrutinib can worsen bleeding and if he notices any new  bleeding, he knows to contact us immediately.    #Dysphagia to solids.  -Previously needed an esophageal dilation.  -He is meeting with GI as noted above to discuss dysphagia and evaluate for an EGD    #Rash, resolved to back, Today with mild rash vs ecchymosis to R ant shin  Eruption involving left > right side of back and right leg. No clear vesicles were noted, and the rash crosses the midline. Will prescribe topical steroid for possible drug-related eruption. Advised patient to alert [place] to worsening or evolution of rash. Mild ecchymotic appearance to R anterior shin, no distinct raised features.    #Falls:  - Experienced a fall last Monday when carrying large tub of papers out to the trash can. He did not hit his head. Large bruise to his R posterior thigh without tenderness to palpation to hip, knee.   - Falls typically 1 times per month. Notes poor balance s/p CVA in 2011. Did to PT for a short time 3 years ago but does not feel it was helpful nor does not have the time to continue.   - He is open to meeting with our PM&R colleague, Dr Sudie Bailey. We will try to coordinate to meet on same day as our next visit.    #Skin excoriation left temple.  -Recommended dermatology evaluation as this is suspicious for SCC.  He has a Ship broker who he will contact. He has an appt on 02/21/23 but would like to see someone sooner  [facility] internal referral placed in the case can be seen before August    #Multiple medical comorbidities.  -Managed by PCP and other specialists.      #. Preventive  Cancer screening:  Patients with CLL/SLL are at increased risk of developing secondary malignancies.  Patient is not currently due for screening.  Annual dermatology evaluation, due to increased risk of non-melanomatous skin cancers.   If needed, blood products for transfusion should be irradiated.  Risk of Hypogammaglobulinemia: Baseline IgG levels 03/11/19 = 698, 07/23/22 1095  Vaccination  Avoid all live vaccines.  Recommend annual influenza vaccine.  Last given: 05/28/22  Recommend COVID vaccine series and booster.  Last given:  04/13/20  Recommend pneumococcal vaccine with Prevnar 20.  Will need to receive this 6 months after completion of obinutuzumab.  Recommend RSV vaccine. Last given:  07/18/22  Recommend Zoster vaccine recombinant, adjuvanted (Shingrix).  Last given: 01/18/13 (chart lists as zostavax, will need to confirm)     -RTC in one month for next cycle      Cruzita Lederer, MSN, FNP-BC, [facility]  Nurse Practitioner  [facility] Hematology/Oncology     Hematology Attending Attestation:     In total I spent 60 minutes in this visit which consisted of collecting a history and making medical management decisions for the diagnoses listed in my note, as well as reviewing and updating the patient's medical record, coordinating care, and documenting before, during and after the visit.     I personally reviewed the above information with the dictating provider and interviewed and examined the patient, on the date of service, to confirm the history and physical findings. Labs and images were independently reviewed. I elaborated the plan, discussed it with the patient and the team, and revised the above text as required.     Chilton Si, DO  Associate Professor of Medicine  Division of Hematology  [facility]  [place] [facility] [place] [facility] [place]  Chief Complaint:     No chief complaint on file.      Oncology/Treatment History:     Hematology/Oncology History   CLL (chronic lymphocytic leukemia) (CMS-HCC)   05/21/2021 Initial Diagnosis    CLL (chronic lymphocytic leukemia) (CMS-HCC)  -While he was officially diagnosed in 2022, he had a mild lymphocytosis dating back to 2017, when he was undergoing radiation for his prostate cancer.  On 02/08/2016, he had a flow cytometry, which showed a small population of monoclonal B-cells that were CD5-/CD10- with a total of 2.9k/uL clonal cells.  Observation was recommended.   -further evaluation of asymptomatic lymphocytosis ensued 05/21/2021.  -WBC 20.1, Hgb 12.5, Plt 238, ALC 13.8, ANC 3.96, B2M Not done, LDH 116 (ULN 192)  -Flow showed monoclonal B-cell population consistent with CLL with absolute clonal count of 3.74 k/UL, however a separate population of CD5-/CD10- monoclonal B-cells was detected  -Rai Stage: 1  -IGHV: Unmutated  -FISH showed: Normal  -Karyotype showed: Not completed.  -Recommendation: observation       07/01/2021 Interval Scan(s)    PET scan with scattered LAD, max size 2.0cm, No hepatosplenomegaly.     07/05/2021 Biopsy    Bone marrow biopsy showed hypercellular bone marrow with involvement of a low-grade B-cell lymphoproliferative disorder.  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 Ki-67 stain is variable in the interfollicular areas, 10% to 30%.   There are two separate B cell clones, in roughly equal proportions:   1) Kappa light chain restricted (dim expression), CD20+ (dim), CD5+, CD23+, CD43+, FMC-   2) Lambda light chain restricted, CD20+, CD5-, CD23+, CD43-, FMC+(dim).      06/26/2022 Interval Scan(s)    PET scan with continued LAD with mild progression.  Largest node 2.4cm in mesentery.     07/23/2022 Molecular Markers    -FISH showed: Normal  -Karyotype showed: 46,XY,del(7)(q31q32)[14]/46,XY[6]   -TP53 mutation was detected c.375G>A      08/05/2022 Biopsy    Bone marrow biopsy:  -  Hypercellular bone marrow (90%) primarily involved by CD5+ B-cell lymphoma/leukemia, consistent with chronic lymphocytic leukemia, representing approximately 70% of marrow cellularity by immunohistochemistry (see Comment)  -  Clonal immunoglobulin heavy (IGH) and kappa light chain (IGK) gene rearrangements identified (see Comment)            History Obtained From:   Patient and his significant other.    History of Present Illness:   SHANTE MAYSONET is a 82 y.o. male with a history of multiple cancers, who presents with progressive lymphocytosis due to his lymphoproliferative disorder and consultation for treatment recommendations.  For detailed oncologic and treatment (if applicable) history, please see above.     Interim History   Since his last visit, Mr. Haymond reports he has been feeling surprisingly well. His primary symptomatic complaint is a pruritic rash to bilateral anterior shin, R > L, which has improved over the last 2-3 weeks. The rash to his back and right posterior leg is now resolved.  He did have a fall last week and did not hit his head.  Does not have pain with ambulation or wt bearing. He notices poor balance since his CVA in 2011.    He denies new constitutional symptoms such as anorexia, weight loss, fatigue, night sweats or unexplained fevers.  Furthermore, he denies symptoms of marrow failure: unexplained bleeding or bruising, recurrent or unexplained intercurrent  infections, dyspnea on exertion, lightheadedness, palpitations or chest pain.  There have been no new or unexplained pains or self-identified masses, swelling or enlarged lymph nodes.       Review of Systems   Complete ROS performed and was negative except what is noted in interim history above.    Past Medical History     Past Medical History:   Diagnosis Date    Bladder cancer (CMS-HCC) 09/13/2021    CAD (coronary artery disease)     CLL (chronic lymphocytic leukemia) (CMS-HCC)     GERD (gastroesophageal reflux disease)     Hyperlipidemia     Hypertension     Marginal zone B-cell lymphoma (CMS-HCC)     Multinodular goiter     OSA (obstructive sleep apnea)     Prostate cancer (CMS-HCC) 10/2015    Stroke (CMS-HCC) 2011       Past Surgical History:     Past Surgical History:   Procedure Laterality Date    BIOPSY OF THYROID [facility] HISTORICAL RESULT)      BONE MARROW BIOPSY      CARPAL TUNNEL RELEASE      CATARACT EXTRACTION Bilateral     CORONARY ARTERY BYPASS GRAFT  2005    HEMORRHOID SURGERY  2002    LYMPH NODE BIOPSY  07/08/2021    PAROTIDECTOMY Left 08/04/2017    TONSILLECTOMY AND ADENOIDECTOMY      TOTAL KNEE ARTHROPLASTY Left 2014    TOTAL KNEE ARTHROPLASTY Right 05/17/2018    TRANSURETHRAL RESECTION OF BLADDER TUMOR  09/13/2021       Allergies:     No Known Allergies      Family History:     Family History   Problem Relation Age of Onset    Heart disease Mother     Parkinsonism Father     Alzheimer's disease Father     Diabetes Paternal Grandfather        Social History:     Social History Tobacco Use    Smoking status: Never    Smokeless tobacco: Never   Vaping Use    Vaping status: Never Used   Substance Use Topics    Alcohol use: Not Currently    Drug use: Never       Medications:     Current Outpatient Medications   Medication Sig Dispense Refill    acalabrutinib (CALQUENCE) 100 mg tablet Take 1 tablet (100 mg total) by mouth two (2) times a day. Swallow whole with water. Do not to chew, crush, dissolve, or cut tablets. 60 tablet 11    acetaminophen (TYLENOL) 325 MG tablet Take 1.5 tablets (487.5 mg total) by mouth nightly. 100 tablet 2    allopurinol (ZYLOPRIM) 300 MG tablet Take 1 tablet (300 mg total) by mouth daily. 30 tablet 1    ascorbic acid, vitamin C, (VITAMIN C) 1000 MG tablet Take 1 tablet (1,000 mg total) by mouth.      aspirin (ECOTRIN) 81 MG tablet Take 1 tablet (81 mg total) by mouth.      atorvastatin (LIPITOR) 80 MG tablet       b complex vitamins (SUPER B-50 COMPLEX) capsule Take 1 capsule by mouth daily. 30 capsule 11    bimatoprost (LUMIGAN) 0.01 % Drop Administer 1 drop to both eyes nightly. 30 mL 0    cholecalciferol, vitamin D3-250 mcg, 10,000 unit,, 250 mcg (10,000 unit) capsule Take 0.5 capsules (125 mcg total) by mouth daily. 120 capsule 2    coenzyme Q10 200  mg capsule Take 1 capsule (200 mg total) by mouth.      cyanocobalamin, vitamin B-12, 1000 MCG tablet Take 0.5 tablets (500 mcg total) by mouth daily. 30 tablet 0    ENTRESTO 24-26 mg tablet Take 1 tablet by mouth.      ferrous sulfate 325 (65 FE) MG tablet Take 1 tablet (325 mg total) by mouth every other day. 30 tablet 11    fish,bora,flax oils-om3,6,9no1 (OMEGA 3-6-9) 1,200 mg cap Take 1 tablet by mouth in the morning. 30 capsule 0    L.acidophil-L.plantar-Bifido 7 15 billion cell cap Take 5 mg by mouth in the morning. 30 capsule 0    lysine 1,000 mg Tab Take 1,000 mg by mouth.      magnesium gluconate (MAGONATE) 500 mg (27 mg elem magnesium) tablet Take 1 tablet by mouth daily. 30 tablet 0    midodrine (PROAMATINE) 5 MG tablet Take 1 tablet (5 mg total) by mouth Three (3) times a day.      mv-min-FA-vit K-lutein-zeaxant (PRESERVISION AREDS 2 PLUS MV) 200 mcg-15 mcg- 5 mg-1 mg cap Take 1 tablet by mouth in the morning. 30 capsule 0    NON FORMULARY Astralagus, Black elderberry, reishii      ondansetron (ZOFRAN) 4 MG tablet Take 1 tablet (4 mg) by mouth once a day as needed for nausea. 30 tablet 0    selenium 200 mcg Tab Take 1 tablet (200 mcg total) by mouth.      spironolactone (ALDACTONE) 25 MG tablet Take 1 tablet (25 mg total) by mouth daily.      tamsulosin (FLOMAX) 0.4 mg capsule       traMADol (ULTRAM) 50 mg tablet TAKE 1 TO 2 TABLETS BY MOUTH THREE TIMES DAILY AS NEEDED FOR PAIN      triamcinolone (KENALOG) 0.1 % ointment Apply topically two (2) times a day. 30 g 0    valACYclovir (VALTREX) 500 MG tablet Take 1 tablet (500 mg total) by mouth daily. 30 tablet 5    zinc gluconate 50 mg (7 mg elemental zinc) tablet Take 1 tablet (50 mg total) by mouth daily.       No current facility-administered medications for this visit.       Physical Exam:     PHYSICAL EXAMINATION:  ECOG: 2   VITAL SIGNS:  There were no vitals taken for this visit.    General: well appearing, in no acute distress  HEENT: Sclera anicteric Conjunctivae not injected  Resp: Normal WOB, symmetric excursion, CTAB  CV: RRR without murmurs, rubs or gallops.    GI: Normal bowel sounds, abdomen soft, non-tender, non-distended  Skin: Ecchymotic flat area to the R shin, non raised.   Neuro: Alert and oriented to conversation, EOM intact, no facial droop, speech fluent and coherent, moves all extremities without asymmetry and with at least antigravity strength  Psych: Normal affect  Extremities: No clubbing or lower extremity edema appreciated. Large ecchymosis noted to the R posterior hip and thigh soft to touch, without tenderness to palpation to hip, knee, femur.  LYMPH: No palpable cervical, supraclavicular, axillary, inguinal lymphadenopathy. Labs:   See oncology history      Pathology:   See oncology history    Imaging:   I personally reviewed all of the patient's pertinent images and documented any relevant findings in the assessment/plan.  See outside records.    No new pertinent imaging today.

## 2022-10-29 ENCOUNTER — Other Ambulatory Visit: Admit: 2022-10-29 | Discharge: 2022-10-30 | Payer: MEDICARE

## 2022-10-29 ENCOUNTER — Ambulatory Visit: Admit: 2022-10-29 | Discharge: 2022-10-30 | Payer: MEDICARE | Attending: Hematology | Primary: Hematology

## 2022-10-29 ENCOUNTER — Ambulatory Visit: Admit: 2022-10-29 | Discharge: 2022-10-30 | Payer: MEDICARE

## 2022-10-29 DIAGNOSIS — Z5111 Encounter for antineoplastic chemotherapy: Principal | ICD-10-CM

## 2022-10-29 DIAGNOSIS — C9110 Chronic lymphocytic leukemia of B-cell type not having achieved remission: Principal | ICD-10-CM

## 2022-10-29 LAB — CBC W/ AUTO DIFF
BASOPHILS ABSOLUTE COUNT: 0 10*9/L (ref 0.0–0.1)
BASOPHILS RELATIVE PERCENT: 0.3 %
EOSINOPHILS ABSOLUTE COUNT: 0.4 10*9/L (ref 0.0–0.5)
EOSINOPHILS RELATIVE PERCENT: 2.6 %
HEMATOCRIT: 36.2 % — ABNORMAL LOW (ref 39.0–48.0)
HEMOGLOBIN: 11.9 g/dL — ABNORMAL LOW (ref 12.9–16.5)
LYMPHOCYTES ABSOLUTE COUNT: 9.8 10*9/L — ABNORMAL HIGH (ref 1.1–3.6)
LYMPHOCYTES RELATIVE PERCENT: 59.3 %
MEAN CORPUSCULAR HEMOGLOBIN CONC: 33 g/dL (ref 32.0–36.0)
MEAN CORPUSCULAR HEMOGLOBIN: 31.4 pg (ref 25.9–32.4)
MEAN CORPUSCULAR VOLUME: 95 fL (ref 77.6–95.7)
MEAN PLATELET VOLUME: 8.9 fL (ref 6.8–10.7)
MONOCYTES ABSOLUTE COUNT: 0.8 10*9/L (ref 0.3–0.8)
MONOCYTES RELATIVE PERCENT: 4.9 %
NEUTROPHILS ABSOLUTE COUNT: 5.4 10*9/L (ref 1.8–7.8)
NEUTROPHILS RELATIVE PERCENT: 32.9 %
PLATELET COUNT: 158 10*9/L (ref 150–450)
RED BLOOD CELL COUNT: 3.81 10*12/L — ABNORMAL LOW (ref 4.26–5.60)
RED CELL DISTRIBUTION WIDTH: 18.2 % — ABNORMAL HIGH (ref 12.2–15.2)
WBC ADJUSTED: 16.4 10*9/L — ABNORMAL HIGH (ref 3.6–11.2)

## 2022-10-29 LAB — SLIDE REVIEW

## 2022-10-29 LAB — COMPREHENSIVE METABOLIC PANEL
ALBUMIN: 3.3 g/dL — ABNORMAL LOW (ref 3.4–5.0)
ALKALINE PHOSPHATASE: 94 U/L (ref 46–116)
ALT (SGPT): 26 U/L (ref 10–49)
ANION GAP: 6 mmol/L (ref 5–14)
AST (SGOT): 33 U/L (ref <=34)
BILIRUBIN TOTAL: 0.5 mg/dL (ref 0.3–1.2)
BLOOD UREA NITROGEN: 24 mg/dL — ABNORMAL HIGH (ref 9–23)
BUN / CREAT RATIO: 27
CALCIUM: 10 mg/dL (ref 8.7–10.4)
CHLORIDE: 108 mmol/L — ABNORMAL HIGH (ref 98–107)
CO2: 25 mmol/L (ref 20.0–31.0)
CREATININE: 0.88 mg/dL
EGFR CKD-EPI (2021) MALE: 86 mL/min/{1.73_m2} (ref >=60)
GLUCOSE RANDOM: 109 mg/dL (ref 70–179)
POTASSIUM: 4.4 mmol/L (ref 3.5–5.1)
PROTEIN TOTAL: 5.6 g/dL — ABNORMAL LOW (ref 5.7–8.2)
SODIUM: 139 mmol/L (ref 135–145)

## 2022-10-29 LAB — PHOSPHORUS: PHOSPHORUS: 3.2 mg/dL (ref 2.4–5.1)

## 2022-10-29 LAB — MAGNESIUM: MAGNESIUM: 1.9 mg/dL (ref 1.6–2.6)

## 2022-10-29 LAB — FERRITIN: FERRITIN: 229.8 ng/mL

## 2022-10-29 LAB — URIC ACID: URIC ACID: 3.9 mg/dL

## 2022-10-29 LAB — IRON & TIBC
IRON SATURATION: 22 % (ref 20–55)
IRON: 54 ug/dL — ABNORMAL LOW
TOTAL IRON BINDING CAPACITY: 250 ug/dL (ref 250–425)

## 2022-10-29 LAB — LACTATE DEHYDROGENASE: LACTATE DEHYDROGENASE: 169 U/L (ref 120–246)

## 2022-10-29 MED ADMIN — obinutuzumab 1,000 mg in sodium chloride (NS) 0.9 % 250 mL IVPB: 1000 mg | INTRAVENOUS | @ 20:00:00 | Stop: 2022-10-29

## 2022-10-29 MED ADMIN — sodium chloride (NS) 0.9 % infusion: 100 mL/h | INTRAVENOUS | @ 19:00:00

## 2022-10-29 MED ADMIN — acetaminophen (TYLENOL) tablet 650 mg: 650 mg | ORAL | @ 19:00:00 | Stop: 2022-10-29

## 2022-10-29 MED ADMIN — diphenhydrAMINE (BENADRYL) capsule/tablet 25 mg: 25 mg | ORAL | @ 19:00:00 | Stop: 2022-10-29

## 2022-10-29 MED ADMIN — dexAMETHasone (DECADRON) tablet 20 mg: 20 mg | ORAL | @ 19:00:00 | Stop: 2022-10-29

## 2022-10-29 NOTE — Unmapped (Signed)
Informed Consent Documentation  Study: [facility] 1850     I met with patient Brendan Bishop to re-consent to the [id number] trial. The previous Consent, HIPAA, and Biospecimen forms signed on 10/03/2022 were not legible. The forms were re-signed with legible hand writing.     I spoke with patient Brendan Bishop  to discuss consent for the [facility] 1850 trial.  The protocol was reviewed including discussion of risks & benefits, confidentiality, time commitments involved, study contact list, and the option to withdraw at any time. Alternatives to study participation were discussed.  The patient was given reasonable time to consider participation in the study, in the absence of coercion or undue influence. Patient was offered an opportunity for questions and these questions were answered.  Patient verbalized understanding of information presented.     The patient has signed the study informed consent and HIPAA Authorization Form as follows:  X     In person  A copy of the signed informed consent and HIPAA Authorization Form was given to the patient.  The informed consent and HIPAA Authorization Form will be placed in the regulatory files in the Office of Clinical & Translational Research as well as scanned into [employer]. Every effort to maintain confidentiality will be employed.        Date: 10/29/2022  X     HIPAA Form Signature:  Same as above       Plan: Patient to take part in up to 7 study visits during established chemo treatments for their Hodgkin or non-Hodgkin lymphoma diagnosis, at timepoints at enrollment, mid cycle, end of treatment, 6 mo post treatment, 12 mo post treatment, and 24 mo post treatment. Study visits include various assessments some of which will be sent electronically and others will be completed in person.     OPTIONAL: EDTA (lavender) top tube of blood drawn (~10 ml total).  Blood will be collected at all time points EXCEPT midcycle.  _ Patient accepted/declined optional blood draw. Inclusion Criteria (ref where we get info)  19.26 years of age or older (Yes/[birth year])  2.Diagnosed with either non-Hodgkin or Hodgkin lymphoma (Yes per progress note 10/29/22)  3.English speaking (Yes per demographics page)   4.Patients has an outpatient appointment or being seen at [facility] for treatment of lymphoma (Yes)

## 2022-10-30 NOTE — Unmapped (Signed)
Lab on 10/29/2022   Component Date Value Ref Range Status    Sodium 10/29/2022 139  135 - 145 mmol/L Final    Potassium 10/29/2022 4.4  3.5 - 5.1 mmol/L Final    Chloride 10/29/2022 108 (H)  98 - 107 mmol/L Final    CO2 10/29/2022 25.0  20.0 - 31.0 mmol/L Final    Anion Gap 10/29/2022 6  5 - 14 mmol/L Final    BUN 10/29/2022 24 (H)  9 - 23 mg/dL Final    Creatinine 16/03/9603 0.88  0.73 - 1.18 mg/dL Final    BUN/Creatinine Ratio 10/29/2022 27   Final    eGFR CKD-EPI (2021) Male 10/29/2022 86  >=60 mL/min/1.19m2 Final    eGFR calculated with CKD-EPI 2021 equation in accordance with [employer] and [employer] of Nephrology Task Force recommendations.    Glucose 10/29/2022 109  70 - 179 mg/dL Final    Calcium 54/02/8118 10.0  8.7 - 10.4 mg/dL Final    Albumin 14/78/2956 3.3 (L)  3.4 - 5.0 g/dL Final    Total Protein 10/29/2022 5.6 (L)  5.7 - 8.2 g/dL Final    Total Bilirubin 10/29/2022 0.5  0.3 - 1.2 mg/dL Final    AST 21/30/8657 33  <=34 U/L Final    ALT 10/29/2022 26  10 - 49 U/L Final    Alkaline Phosphatase 10/29/2022 94  46 - 116 U/L Final    Magnesium 10/29/2022 1.9  1.6 - 2.6 mg/dL Final    Phosphorus 84/69/6295 3.2  2.4 - 5.1 mg/dL Final    LDH 28/41/3244 169  120 - 246 U/L Final    Uric Acid 10/29/2022 3.9  3.7 - 9.2 mg/dL Final    Iron 06/25/7251 54 (L)  65 - 175 ug/dL Final    TIBC 66/44/0347 250  250 - 425 ug/dL Final    Iron Saturation (%) 10/29/2022 22  20 - 55 % Final    Ferritin 10/29/2022 229.8  10.5 - 307.3 ng/mL Final    WBC 10/29/2022 16.4 (H)  3.6 - 11.2 10*9/L Final    RBC 10/29/2022 3.81 (L)  4.26 - 5.60 10*12/L Final    HGB 10/29/2022 11.9 (L)  12.9 - 16.5 g/dL Final    HCT 42/59/5638 36.2 (L)  39.0 - 48.0 % Final    MCV 10/29/2022 95.0  77.6 - 95.7 fL Final    MCH 10/29/2022 31.4  25.9 - 32.4 pg Final    MCHC 10/29/2022 33.0  32.0 - 36.0 g/dL Final    RDW 75/64/3329 18.2 (H)  12.2 - 15.2 % Final    MPV 10/29/2022 8.9  6.8 - 10.7 fL Final    Platelet 10/29/2022 158  150 - 450 10*9/L Final    Neutrophils % 10/29/2022 32.9  % Final    Lymphocytes % 10/29/2022 59.3  % Final    Monocytes % 10/29/2022 4.9  % Final    Eosinophils % 10/29/2022 2.6  % Final    Basophils % 10/29/2022 0.3  % Final    Absolute Neutrophils 10/29/2022 5.4  1.8 - 7.8 10*9/L Final    Absolute Lymphocytes 10/29/2022 9.8 (H)  1.1 - 3.6 10*9/L Final    Absolute Monocytes 10/29/2022 0.8  0.3 - 0.8 10*9/L Final    Absolute Eosinophils 10/29/2022 0.4  0.0 - 0.5 10*9/L Final    Absolute Basophils 10/29/2022 0.0  0.0 - 0.1 10*9/L Final    Anisocytosis 10/29/2022 Slight (A)  Not Present Final    Smear Review  Comments 10/29/2022 See Comment (A)  Undefined Final    [id number] Slide reviewed.     Basophilic Stippling 10/29/2022 Present (A)  Not Present Final

## 2022-10-30 NOTE — Unmapped (Signed)
Patient arrived to chair 7 for Obinutuzumab.  No new complaints noted.  New PIV access established as previous PIV unusable. Infusion initiated and titrated as directed, all VS WNL. Pt moved to chair 49 and care endorsed to Maryanna Shape, RN.

## 2022-10-30 NOTE — Unmapped (Signed)
Report received from Levin Erp. RN. Obinutuzumab running at ordered rate. Patient completed and tolerated treatment.  AVS declined, PIV removed and patient discharged to home.

## 2022-10-31 DIAGNOSIS — C9110 Chronic lymphocytic leukemia of B-cell type not having achieved remission: Principal | ICD-10-CM

## 2022-10-31 DIAGNOSIS — Z5111 Encounter for antineoplastic chemotherapy: Principal | ICD-10-CM

## 2022-11-06 NOTE — Unmapped (Addendum)
NO MAIL BOX SET UP; could not leave message, sent My Chart to call and schedule with Dr Archie Balboa----- Message from Arvilla Market, MD sent at 11/04/2022 10:06 AM EDT -----  Regarding: RE: referral for triage  [facility] works -     Aram Beecham - can we get him in within the next month or so?     Puneet  ----- Message -----  From: Jorene Minors  Sent: 11/04/2022   9:35 AM EDT  To: Arvilla Market, MD  Subject: referral for triage                              Good Morning Dr.Jolly,    Mr.Storck is being referred to you by Laurice Record, FNP.    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  -Recommended dermatology evaluation as this is suspicious for SCC.  He has a Ship broker who he will contact. He has an appt on 02/21/23 but would like to see someone sooner  [facility] Dermatology internal referral placed in the case can be seen before August    Does he need to be seen by you in [facility] or can he see any general provider?    Dawn

## 2022-11-12 NOTE — Unmapped (Addendum)
-----   Message -----  From: Karie Georges, RN  Sent: 10/29/2022  11:48 AM EDT  To: Laurice Record, FNP; Karie Georges, RN; #    Hi Dr. Sudie BaileyMarval Regal would like to refer this patient to you. Would you have availability to see this patient the same day as his next visit of 6/5?    Thanks    [employer]

## 2022-11-14 NOTE — Unmapped (Signed)
[facility] Refill Coordination Note    Specialty Medication(s) to be Shipped:   Hematology/Oncology: Calquence    Other medication(s) to be shipped:  Valtrex     Brendan Bishop, DOB: May 13, 1941  Phone: [phone] (home) [phone] (work)      All above HIPAA information was verified with patient.     Was a Nurse, learning disability used for this call? No    Completed refill call assessment today to schedule patient's medication shipment from the [facility] [phone]).  All relevant notes have been reviewed.     Specialty medication(s) and dose(s) confirmed: Regimen is correct and unchanged.   Changes to medications: Brendan Bishop reports no changes at this time.  Changes to insurance: No  New side effects reported not previously addressed with a pharmacist or physician: None reported  Questions for the pharmacist: No    Confirmed patient received a Conservation officer, historic buildings and a Surveyor, mining with first shipment. The patient will receive a drug information handout for each medication shipped and additional FDA Medication Guides as required.       DISEASE/MEDICATION-SPECIFIC INFORMATION        N/A    SPECIALTY MEDICATION ADHERENCE     Medication Adherence    Patient reported X missed doses in the last month: 0  Specialty Medication: Calquence 100mg   Patient is on additional specialty medications: No  Informant: patient     Were doses missed due to medication being on hold? No    Calquence 100 mg: 16 days of medicine on hand       REFERRAL TO PHARMACIST     Referral to the pharmacist: Not needed      [facility]     Shipping address confirmed in Epic.       Delivery Scheduled: Yes, Expected medication delivery date: 11/27/22.     Medication will be delivered via Next Day Courier to the prescription address in Epic [place].    Brendan Bishop   [facility] Specialty Technician

## 2022-11-14 NOTE — Unmapped (Addendum)
2x attempts to reach patient; phone continues to ring NO Answer----- Message from Arvilla Market, MD sent at 11/12/2022  2:23 PM EDT -----  Regarding: RE: referral for triage  C - I can't remember if I wrote you about him?     [facility] - next available works best.     Garth Bigness  ----- Message -----  From: Jorene Minors  Sent: 11/04/2022   9:35 AM EDT  To: Arvilla Market, MD  Subject: referral for triage                              Good Morning Dr.Jolly,    Mr.Whitacre is being referred to you by Laurice Record, FNP.    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  -Recommended dermatology evaluation as this is suspicious for SCC.  He has a Ship broker who he will contact. He has an appt on 02/21/23 but would like to see someone sooner  [facility] Dermatology internal referral placed in the case can be seen before August    Does he need to be seen by you in [facility] or can he see any general provider?    Dawn

## 2022-11-24 DIAGNOSIS — C9110 Chronic lymphocytic leukemia of B-cell type not having achieved remission: Principal | ICD-10-CM

## 2022-11-24 DIAGNOSIS — Z5111 Encounter for antineoplastic chemotherapy: Principal | ICD-10-CM

## 2022-11-25 NOTE — Unmapped (Unsigned)
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 male with a history of multiple cancers, who presents for follow-up of biphenotypic LPD.    #Biphenotypic B-cell lymphoproliferative disorder  -He has 2 distinct abnormal monoclonal B-cell populations.  One is classic for CLL with CD5+ and dim CD20 and kappa restricted.  The other is more consistent with marginal zone lymphoma which is CD5-/CD10-/CD20+ and lambda restricted.  As all pathology has been equal distribution of both cell types, it is unclear which or if both are contributing equally to the progressive lymphocytosis.  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  -Due to rapidly progressive lymphocytosis and progressive lymphadenopathy and anemia, he meets criteria for treatment.  -Interestingly, his peripheral blood flow from 07/23/22 showed 60% of the CD5- population and 10% of the CD5+ population, while the bone marrow biopsy from 08/06/22 demonstrated 90% of the CD5+ (although both populations were detected by IGH sequencing)  -Previously discussed potential treatment options, but the best option that would be effective against both clones is the combination of obinutuzumab and acalabrutinib as given for frontline CLL therapy in the ELEVATE [place] study. Obinutuzumab would be given for 6 months and acalabrutinib would be given continuously.  -Potential side effects include but are not limited to infusion reaction, infection, neutropenia, diarrhea, nausea, heartburn, joint or muscle aches, bleeding, bruising, atrial fibrillation, hypertension, and headache.  -He initiated therapy with obinutuzumab and acalabrutinib 09/03/22.   -His labs are appropriate to proceed with treatment today. Given possible tick borne illness is controlled and not overtly active, we will proceed with treatment today.    #Tick Bite Pt with multiple tick bites, one of which was buried in skin for 3-4 days. Tick was removed in entirety and since removal patient with increased fatigue. No fevers, chills, HA, photosensitivity, neck pain.  - Will collect acute tick borne illness panel, in process, thus far Lyme, Ehrlichia PCR negative  - Empirically treat with doxycycline x 7 days    #LLE pain: 2 day history pain to the L posterior knee after sitting. Pain remains when on stairs and getting and out of truck  - some edema and palpable cord to posterior knee noted on exam without tenderness to palpation  - PVL to r/o DVT, negative today                                                  #Anemia  -Likely related to B-cell malignancy as above.  -Also iron deficient.  Recommended increase iron supplementation to ferrous sulfate 325mg  every other day.  Recheck 09/03/22 with continued low Tsat at 14% and Ferritin at 40.  Hgb down to 10.  He received IV iron on 10/03/22.  There is mild improvement in Fe panel today. We will continue with PO supplementation.   -He had a colonoscopy in 2022 and said that he had polyps and would have to return in 2027 for next one.  He has an initiation consultation visit on 6/13 with Dr. Adela Lank at [facility] in [place] for EGD/colonoscopy.  Request records when available.  -Also of note, he previously had a bladder tumor that was causing bleeding.  He underwent repeat cystoscopy on 10/22/22 with Dr. Legrand Rams.  No evidence of recurrent disease.  He has requested repeat cystoscopy in 9 months, which should be ~2/25.  -We have reviewed with him that acalabrutinib can worsen bleeding and if he notices any new bleeding, he knows to contact us immediately.    #Dysphagia to solids  -Previously needed an esophageal dilation.  -He is meeting with GI as noted above to discuss dysphagia and evaluate for an EGD    #Rash, resolved  Eruption involving left > right side of back and right leg. No clear vesicles were noted, and the rash crosses the midline. Will prescribe topical steroid for possible drug-related eruption. Advised patient to alert [place] to worsening or evolution of rash. Mild ecchymotic appearance to R anterior shin, no distinct raised features.    #Falls:  - Experienced a fall last Monday when carrying large tub of papers out to the trash can. He did not hit his head. Large bruise to his R posterior thigh without tenderness to palpation to hip, knee.   - Falls typically 1 times per month. Notes poor balance s/p CVA in 2011. Did to PT for a short time 3 years ago but does not feel it was helpful nor does not have the time to continue.   - He declined PT and Dr Sudie Bailey at this time given time commitment required    #Skin excoriation left temple.  -Recommended dermatology evaluation as this is suspicious for SCC.  He has a Ship broker who he will contact. He has an appt on 02/21/23   [facility] Dermatology internal referral placed however patient is not able to make it to these appts on Tuesdays therefore will keep appt with local Derm.    #Multiple medical comorbidities.  -Managed by PCP and other specialists.      #. Preventive  Cancer screening:  Patients with CLL/SLL are at increased risk of developing secondary malignancies.  Patient is not currently due for screening.  Annual dermatology evaluation, due to increased risk of non-melanomatous skin cancers.   If needed, blood products for transfusion should be irradiated.  Risk of Hypogammaglobulinemia: Baseline IgG levels 03/11/19 = 698, 07/23/22 1095  Vaccination  Avoid all live vaccines.  Recommend annual influenza vaccine.  Last given: 05/28/22  Recommend COVID vaccine series and booster.  Last given:  04/13/20  Recommend pneumococcal vaccine with Prevnar 20.  Will need to receive this 6 months after completion of obinutuzumab.  Recommend RSV vaccine. Last given:  07/18/22  Recommend Zoster vaccine recombinant, adjuvanted (Shingrix).  Last given: 01/18/13 (chart lists as zostavax, will need to confirm)     -RTC in one month for next cycle  Patient to contact us in 1-2 weeks to update [place] on how he is feeling      In total I spent 70 minutes in this visit which consisted of collecting a history and making medical management decisions for the diagnoses listed in my note, as well as reviewing and  updating the patient's medical record, coordinating care, and documenting before, during and after the visit.     Cruzita Lederer, MSN, FNP-BC, [facility] Day Surgery  Nurse Practitioner  [facility] Hematology/Oncology           Chief Complaint:     Chief Complaint   Patient presents with    Routine Follow-up       Oncology/Treatment History:     Hematology/Oncology History   CLL (chronic lymphocytic leukemia) (CMS-HCC)   05/21/2021 Initial Diagnosis    CLL (chronic lymphocytic leukemia) (CMS-HCC)  -While he was officially diagnosed in 2022, he had a mild lymphocytosis dating back to 2017, when he was undergoing radiation for his prostate cancer.  On 02/08/2016, he had a flow cytometry, which showed a small population of monoclonal B-cells that were CD5-/CD10- with a total of 2.9k/uL clonal cells.  Observation was recommended.   -further evaluation of asymptomatic lymphocytosis ensued 05/21/2021.  -WBC 20.1, Hgb 12.5, Plt 238, ALC 13.8, ANC 3.96, B2M Not done, LDH 116 (ULN 192)  -Flow showed monoclonal B-cell population consistent with CLL with absolute clonal count of 3.74 k/UL, however a separate population of CD5-/CD10- monoclonal B-cells was detected  -Rai Stage: 1  -IGHV: Unmutated  -FISH showed: Normal  -Karyotype showed: Not completed.  -Recommendation: observation       07/01/2021 Interval Scan(s)    PET scan with scattered LAD, max size 2.0cm, No hepatosplenomegaly.     07/05/2021 Biopsy    Bone marrow biopsy showed hypercellular bone marrow with involvement of a low-grade B-cell lymphoproliferative disorder.  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 Ki-67 stain is variable in the interfollicular areas, 10% to 30%.   There are two separate B cell clones, in roughly equal proportions:   1) Kappa light chain restricted (dim expression), CD20+ (dim), CD5+, CD23+, CD43+, FMC-   2) Lambda light chain restricted, CD20+, CD5-, CD23+, CD43-, FMC+(dim).      06/26/2022 Interval Scan(s)    PET scan with continued LAD with mild progression.  Largest node 2.4cm in mesentery.     07/23/2022 Molecular Markers    -FISH showed: Normal  -Karyotype showed: 46,XY,del(7)(q31q32)[14]/46,XY[6]   -TP53 mutation was detected c.375G>A      08/05/2022 Biopsy    Bone marrow biopsy:  -  Hypercellular bone marrow (90%) primarily involved by CD5+ B-cell lymphoma/leukemia, consistent with chronic lymphocytic leukemia, representing approximately 70% of marrow cellularity by immunohistochemistry (see Comment)  -  Clonal immunoglobulin heavy (IGH) and kappa light chain (IGK) gene rearrangements identified (see Comment)            History Obtained From:   Patient and his significant other.    History of Present Illness:   SRIYANSH NORDMEYER is a 82 y.o. male with a history of multiple cancers, who presents with progressive lymphocytosis due to his lymphoproliferative disorder and consultation for treatment recommendations.  For detailed oncologic and treatment (if applicable) history, please see above.     Interim History   Since his last visit, Mr. Fehrman reports he had been feeling very well until he had a tick bite on 5/26. His sig other removed the tick on 5/29, including head but it was quite embedded to the R chest. 2 days after removal, pt with increased fatigue, denies fevers, chills, ns, HA, photosensitivity, neck pain/stiffness, myalgias.     On 6/3, pt noted pain to the  back of his L knee which is still when on stairs or getting in and out of his truck.    No further falls since last visit. Notes some increase in gas abdominal pains and more flatulence.    He denies new constitutional symptoms such as anorexia, weight loss, night sweats or unexplained fevers.  Furthermore, he denies symptoms of marrow failure: unexplained bleeding or bruising, recurrent or unexplained intercurrent infections, dyspnea on exertion, lightheadedness, palpitations or chest pain.  There have been no new or unexplained pains or self-identified masses, swelling or enlarged lymph nodes.     Review of Systems   Complete ROS performed and was negative except what is noted in interim history above.    Past Medical History     Past Medical History:   Diagnosis Date    Bladder cancer (CMS-HCC) 09/13/2021    CAD (coronary artery disease)     CLL (chronic lymphocytic leukemia) (CMS-HCC)     GERD (gastroesophageal reflux disease)     Hyperlipidemia     Hypertension     Marginal zone B-cell lymphoma (CMS-HCC)     Multinodular goiter     OSA (obstructive sleep apnea)     Prostate cancer (CMS-HCC) 10/2015    Stroke (CMS-HCC) 2011       Past Surgical History:     Past Surgical History:   Procedure Laterality Date    BIOPSY OF THYROID [facility] HISTORICAL RESULT)      BONE MARROW BIOPSY      CARPAL TUNNEL RELEASE      CATARACT EXTRACTION Bilateral     CORONARY ARTERY BYPASS GRAFT  2005    HEMORRHOID SURGERY  2002    LYMPH NODE BIOPSY  07/08/2021    PAROTIDECTOMY Left 08/04/2017    TONSILLECTOMY AND ADENOIDECTOMY      TOTAL KNEE ARTHROPLASTY Left 2014    TOTAL KNEE ARTHROPLASTY Right 05/17/2018 TRANSURETHRAL RESECTION OF BLADDER TUMOR  09/13/2021       Allergies:     No Known Allergies      Family History:     Family History   Problem Relation Age of Onset    Heart disease Mother     Parkinsonism Father     Alzheimer's disease Father     Diabetes Paternal Grandfather        Social History:     Social History     Tobacco Use    Smoking status: Never    Smokeless tobacco: Never   Vaping Use    Vaping status: Never Used   Substance Use Topics    Alcohol use: Not Currently    Drug use: Never       Medications:     Current Outpatient Medications   Medication Sig Dispense Refill    acalabrutinib (CALQUENCE) 100 mg tablet Take 1 tablet (100 mg total) by mouth two (2) times a day. Swallow whole with water. Do not to chew, crush, dissolve, or cut tablets. 60 tablet 11    acetaminophen (TYLENOL) 325 MG tablet Take 1.5 tablets (487.5 mg total) by mouth nightly. 100 tablet 2    allopurinol (ZYLOPRIM) 300 MG tablet Take 1 tablet (300 mg total) by mouth daily. 30 tablet 1    ascorbic acid, vitamin C, (VITAMIN C) 1000 MG tablet Take 1 tablet (1,000 mg total) by mouth.      aspirin (ECOTRIN) 81 MG tablet Take 1 tablet (81 mg total) by mouth.      atorvastatin (LIPITOR) 80 MG tablet  b complex vitamins (SUPER B-50 COMPLEX) capsule Take 1 capsule by mouth daily. 30 capsule 11    bimatoprost (LUMIGAN) 0.01 % Drop Administer 1 drop to both eyes nightly. 30 mL 0    cholecalciferol, vitamin D3-250 mcg, 10,000 unit,, 250 mcg (10,000 unit) capsule Take 0.5 capsules (125 mcg total) by mouth daily. 120 capsule 2    coenzyme Q10 200 mg capsule Take 1 capsule (200 mg total) by mouth.      cyanocobalamin, vitamin B-12, 1000 MCG tablet Take 0.5 tablets (500 mcg total) by mouth daily. 30 tablet 0    ENTRESTO 24-26 mg tablet Take 1 tablet by mouth.      ferrous sulfate 325 (65 FE) MG tablet Take 1 tablet (325 mg total) by mouth every other day. 30 tablet 11    fish,bora,flax oils-om3,6,9no1 (OMEGA 3-6-9) 1,200 mg cap Take 1 tablet by mouth in the morning. 30 capsule 0    L.acidophil-L.plantar-Bifido 7 15 billion cell cap Take 5 mg by mouth in the morning. 30 capsule 0    lysine 1,000 mg Tab Take 1,000 mg by mouth.      magnesium gluconate (MAGONATE) 500 mg (27 mg elem magnesium) tablet Take 1 tablet by mouth daily. 30 tablet 0    midodrine (PROAMATINE) 5 MG tablet Take 1 tablet (5 mg total) by mouth Three (3) times a day.      mv-min-FA-vit K-lutein-zeaxant (PRESERVISION AREDS 2 PLUS MV) 200 mcg-15 mcg- 5 mg-1 mg cap Take 1 tablet by mouth in the morning. 30 capsule 0    NON FORMULARY Astralagus, Black elderberry, reishii      ondansetron (ZOFRAN) 4 MG tablet Take 1 tablet (4 mg) by mouth once a day as needed for nausea. 30 tablet 0    selenium 200 mcg Tab Take 1 tablet (200 mcg total) by mouth.      spironolactone (ALDACTONE) 25 MG tablet Take 1 tablet (25 mg total) by mouth daily.      tamsulosin (FLOMAX) 0.4 mg capsule       traMADol (ULTRAM) 50 mg tablet TAKE 1 TO 2 TABLETS BY MOUTH THREE TIMES DAILY AS NEEDED FOR PAIN      triamcinolone (KENALOG) 0.1 % ointment Apply topically two (2) times a day. 30 g 0    valACYclovir (VALTREX) 500 MG tablet Take 1 tablet (500 mg total) by mouth daily. 30 tablet 5    zinc gluconate 50 mg (7 mg elemental zinc) tablet Take 1 tablet (50 mg total) by mouth daily.      doxycycline (VIBRA-TABS) 100 MG tablet Take 1 tablet (100 mg total) by mouth two (2) times a day for 7 days. 14 tablet 0     No current facility-administered medications for this visit.       Physical Exam:     PHYSICAL EXAMINATION:  ECOG: 2   VITAL SIGNS:  BP 103/63  - Pulse 61  - Temp 36.7 ??C (98 ??F)  - Resp 17  - Wt 77.2 kg (170 lb 3.1 oz)  - SpO2 97%  - BMI 27.48 kg/m??     General: well appearing, in no acute distress  HEENT: Sclera anicteric. Conjunctivae not injected, no neck pain or rigidity noted.  Resp: Normal WOB, symmetric excursion, CTAB  CV: RRR without murmurs, rubs or gallops.    GI: Normal bowel sounds, abdomen soft, non-tender, non-distended  Skin: . tick bite to the right chest with thin layer of epithelization and hyperemia, non-tender, slightly erythematous border, no extension.  L posterior knee with bug bit appearance lesion with thin filament resembling a stinger, which is removed with sterile forceps after cleaning with ETOH swab.  Neuro: Alert and oriented to conversation, EOM intact, no facial droop, speech fluent and coherent, moves all extremities without asymmetry and with at least antigravity strength  Psych: Normal affect  Extremities: No clubbing or lower extremity edema appreciated. LLE posterior knee with mild edema and palpable cord present without warmth, induration, tenderness. Mild compression stocking imprints apparent to bilateral to shin.  LYMPH: No palpable cervical, supraclavicular, axillary, inguinal lymphadenopathy.       Labs:   See oncology history      Pathology:   See oncology history    Imaging:   I personally reviewed all of the patient's pertinent images and documented any relevant findings in the assessment/plan.  See outside records.    No new pertinent imaging today. touch, without tenderness to palpation to hip, knee, femur.  LYMPH: No palpable cervical, supraclavicular, axillary, inguinal lymphadenopathy.       Labs:   See oncology history      Pathology:   See oncology history    Imaging:   I personally reviewed all of the patient's pertinent images and documented any relevant findings in the assessment/plan.  See outside records.    No new pertinent imaging today.

## 2022-11-26 ENCOUNTER — Other Ambulatory Visit: Admit: 2022-11-26 | Discharge: 2022-11-27 | Payer: MEDICARE

## 2022-11-26 ENCOUNTER — Ambulatory Visit: Admit: 2022-11-26 | Discharge: 2022-11-27 | Payer: MEDICARE

## 2022-11-26 DIAGNOSIS — Z5111 Encounter for antineoplastic chemotherapy: Principal | ICD-10-CM

## 2022-11-26 DIAGNOSIS — C9110 Chronic lymphocytic leukemia of B-cell type not having achieved remission: Principal | ICD-10-CM

## 2022-11-26 LAB — CBC W/ AUTO DIFF
BASOPHILS ABSOLUTE COUNT: 0.1 10*9/L (ref 0.0–0.1)
BASOPHILS RELATIVE PERCENT: 0.6 %
EOSINOPHILS ABSOLUTE COUNT: 0.4 10*9/L (ref 0.0–0.5)
EOSINOPHILS RELATIVE PERCENT: 3.7 %
HEMATOCRIT: 34.4 % — ABNORMAL LOW (ref 39.0–48.0)
HEMOGLOBIN: 11.8 g/dL — ABNORMAL LOW (ref 12.9–16.5)
LYMPHOCYTES ABSOLUTE COUNT: 5.4 10*9/L — ABNORMAL HIGH (ref 1.1–3.6)
LYMPHOCYTES RELATIVE PERCENT: 51.3 %
MEAN CORPUSCULAR HEMOGLOBIN CONC: 34.2 g/dL (ref 32.0–36.0)
MEAN CORPUSCULAR HEMOGLOBIN: 32.7 pg — ABNORMAL HIGH (ref 25.9–32.4)
MEAN CORPUSCULAR VOLUME: 95.6 fL (ref 77.6–95.7)
MEAN PLATELET VOLUME: 9.1 fL (ref 6.8–10.7)
MONOCYTES ABSOLUTE COUNT: 0.8 10*9/L (ref 0.3–0.8)
MONOCYTES RELATIVE PERCENT: 7.3 %
NEUTROPHILS ABSOLUTE COUNT: 3.9 10*9/L (ref 1.8–7.8)
NEUTROPHILS RELATIVE PERCENT: 37.1 %
PLATELET COUNT: 140 10*9/L — ABNORMAL LOW (ref 150–450)
RED BLOOD CELL COUNT: 3.6 10*12/L — ABNORMAL LOW (ref 4.26–5.60)
RED CELL DISTRIBUTION WIDTH: 15.7 % — ABNORMAL HIGH (ref 12.2–15.2)
WBC ADJUSTED: 10.5 10*9/L (ref 3.6–11.2)

## 2022-11-26 LAB — SLIDE REVIEW

## 2022-11-26 LAB — COMPREHENSIVE METABOLIC PANEL
ALBUMIN: 3.4 g/dL (ref 3.4–5.0)
ALKALINE PHOSPHATASE: 96 U/L (ref 46–116)
ALT (SGPT): 27 U/L (ref 10–49)
ANION GAP: 4 mmol/L — ABNORMAL LOW (ref 5–14)
AST (SGOT): 28 U/L (ref <=34)
BILIRUBIN TOTAL: 0.4 mg/dL (ref 0.3–1.2)
BLOOD UREA NITROGEN: 23 mg/dL (ref 9–23)
BUN / CREAT RATIO: 25
CALCIUM: 9.6 mg/dL (ref 8.7–10.4)
CHLORIDE: 113 mmol/L — ABNORMAL HIGH (ref 98–107)
CO2: 25 mmol/L (ref 20.0–31.0)
CREATININE: 0.93 mg/dL
EGFR CKD-EPI (2021) MALE: 82 mL/min/{1.73_m2} (ref >=60)
GLUCOSE RANDOM: 94 mg/dL (ref 70–179)
POTASSIUM: 4.6 mmol/L (ref 3.4–4.8)
PROTEIN TOTAL: 5.7 g/dL (ref 5.7–8.2)
SODIUM: 142 mmol/L (ref 135–145)

## 2022-11-26 LAB — PHOSPHORUS: PHOSPHORUS: 3 mg/dL (ref 2.4–5.1)

## 2022-11-26 LAB — LYME DISEASE SEROLOGY: LYME ANTIBODY: NEGATIVE

## 2022-11-26 LAB — LACTATE DEHYDROGENASE: LACTATE DEHYDROGENASE: 165 U/L (ref 120–246)

## 2022-11-26 LAB — URIC ACID: URIC ACID: 5.2 mg/dL

## 2022-11-26 LAB — MAGNESIUM: MAGNESIUM: 1.9 mg/dL (ref 1.6–2.6)

## 2022-11-26 MED ORDER — DOXYCYCLINE HYCLATE 100 MG TABLET
ORAL_TABLET | Freq: Two times a day (BID) | ORAL | 0 refills | 7.00000 days | Status: CN
Start: 2022-11-26 — End: 2022-12-03
  Filled 2022-11-26: qty 14, 7d supply, fill #0

## 2022-11-26 MED ADMIN — sodium chloride (NS) 0.9 % infusion: 100 mL/h | INTRAVENOUS | @ 18:00:00

## 2022-11-26 MED ADMIN — obinutuzumab 1,000 mg in sodium chloride (NS) 0.9 % 250 mL IVPB: 1000 mg | INTRAVENOUS | @ 18:00:00 | Stop: 2022-11-26

## 2022-11-26 MED ADMIN — diphenhydrAMINE (BENADRYL) capsule/tablet 25 mg: 25 mg | ORAL | @ 17:00:00 | Stop: 2022-11-26

## 2022-11-26 MED ADMIN — dexAMETHasone (DECADRON) tablet 20 mg: 20 mg | ORAL | @ 17:00:00 | Stop: 2022-11-26

## 2022-11-26 MED ADMIN — acetaminophen (TYLENOL) tablet 650 mg: 650 mg | ORAL | @ 17:00:00 | Stop: 2022-11-26

## 2022-11-26 MED FILL — CALQUENCE (ACALABRUTINIB MALEATE) 100 MG TABLET: ORAL | 30 days supply | Qty: 60 | Fill #3

## 2022-11-26 MED FILL — VALACYCLOVIR 500 MG TABLET: ORAL | 30 days supply | Qty: 30 | Fill #1

## 2022-11-26 NOTE — Unmapped (Signed)
Patient Instructions:     We are going to treat you for a possible tick borne illness with doxycycline.  Please take for 7 days, twice a day.    Please let us know how you are feeling in about 1-2 weeks    We will also get ultrasounds of your legs to make sure there isn't a blood clot that is causing the leg pain.    Please continue to follow-up with your GI doctor and schedule a colonoscopy    Please see your dermatologist in July    For assistance on co-pay for the obinutuzumab infusion: please call your insurance company directly.     For financial assistance please call [facility] financial navigation [phone]     Take care and see you next time.    Medical Team    Physician: Lawson Radar, DO  Nurse Practitioner: Cruzita Lederer, FNP-BC  Pharmacists: Manfred Arch, PharmD, BCOP, CPP; Ronnald Collum, PharmD, BCOP  Nurse Navigator: Joaquin Bend, MSN, RN, [facility]    Regarding labs and other test results, please know that due to federal laws, all test results are now released and available for review on MyChart immediately upon becoming available.  This means that unlike before, you will now receive results before we can review them and attach comments and explanations. We will still attach those comments or call you as soon as we have all your results (for our standard blood tests, that usually takes 48-72 hours after they're drawn).  For some patients, seeing test results without our comments can cause anxiety about those results and even misunderstanding if a patient interprets them incorrectly.  We regret any such anxiety you may experience if you choose to review labs before we have commented. Please note, however, that to prevent anxiety, we recommend that you consider waiting to review your results until you receive an email that says we have attached our comments.  That makes sure that you receive our interpretation with your labs, which can help to avoid misunderstood results and perhaps undue anxiety. Either way, please know we monitor your test results closely.    Labs:   Lab on 11/26/2022   Component Date Value Ref Range Status    Sodium 11/26/2022 142  135 - 145 mmol/L Final    Potassium 11/26/2022 4.6  3.4 - 4.8 mmol/L Final    Chloride 11/26/2022 113 (H)  98 - 107 mmol/L Final    CO2 11/26/2022 25.0  20.0 - 31.0 mmol/L Final    Anion Gap 11/26/2022 4 (L)  5 - 14 mmol/L Final    BUN 11/26/2022 23  9 - 23 mg/dL Final    Creatinine 09/81/1914 0.93  0.73 - 1.18 mg/dL Final    BUN/Creatinine Ratio 11/26/2022 25   Final    eGFR CKD-EPI (2021) Male 11/26/2022 82  >=60 mL/min/1.54m2 Final    eGFR calculated with CKD-EPI 2021 equation in accordance with [employer] and [employer] of Nephrology Task Force recommendations.    Glucose 11/26/2022 94  70 - 179 mg/dL Final    Calcium 78/29/5621 9.6  8.7 - 10.4 mg/dL Final    Albumin 30/86/5784 3.4  3.4 - 5.0 g/dL Final    Total Protein 11/26/2022 5.7  5.7 - 8.2 g/dL Final    Total Bilirubin 11/26/2022 0.4  0.3 - 1.2 mg/dL Final    AST 69/62/9528 28  <=34 U/L Final    ALT 11/26/2022 27  10 - 49 U/L Final    Alkaline Phosphatase 11/26/2022  96  46 - 116 U/L Final    Magnesium 11/26/2022 1.9  1.6 - 2.6 mg/dL Final    Phosphorus 09/81/1914 3.0  2.4 - 5.1 mg/dL Final    LDH 78/29/5621 165  120 - 246 U/L Final    Uric Acid 11/26/2022 5.2  3.7 - 9.2 mg/dL Final    WBC 30/86/5784 10.5  3.6 - 11.2 10*9/L Final    RBC 11/26/2022 3.60 (L)  4.26 - 5.60 10*12/L Final    HGB 11/26/2022 11.8 (L)  12.9 - 16.5 g/dL Final    HCT 69/62/9528 34.4 (L)  39.0 - 48.0 % Final    MCV 11/26/2022 95.6  77.6 - 95.7 fL Final    MCH 11/26/2022 32.7 (H)  25.9 - 32.4 pg Final    MCHC 11/26/2022 34.2  32.0 - 36.0 g/dL Final    RDW 41/32/4401 15.7 (H)  12.2 - 15.2 % Final    MPV 11/26/2022 9.1  6.8 - 10.7 fL Final    Platelet 11/26/2022 140 (L)  150 - 450 10*9/L Final    Neutrophils % 11/26/2022 37.1  % Final    Lymphocytes % 11/26/2022 51.3  % Final    Monocytes % 11/26/2022 7.3  % Final    Eosinophils % 11/26/2022 3.7  % Final    Basophils % 11/26/2022 0.6  % Final    Absolute Neutrophils 11/26/2022 3.9  1.8 - 7.8 10*9/L Final    Absolute Lymphocytes 11/26/2022 5.4 (H)  1.1 - 3.6 10*9/L Final    Absolute Monocytes 11/26/2022 0.8  0.3 - 0.8 10*9/L Final    Absolute Eosinophils 11/26/2022 0.4  0.0 - 0.5 10*9/L Final    Absolute Basophils 11/26/2022 0.1  0.0 - 0.1 10*9/L Final       CONTACT INFORMATION     PHONE:     From Monday through Friday, 8am - 5pm, for all questions including appointments and clinical issues please call [phone] or toll-free [phone].  Please ask for nurse triage.     On Nights, Weekends and Holidays, for emergencies call [phone].    Please fax Insurance, Disability, or FMLA paperwork to [phone]. Please allow 1 week for completion of paperwork.      Leanora Ivanoff (messages you want to send through the electronic health record):     1) Send messages to Dr. Zonia Kief as needed. Messages are reviewed by our nursing staff and handled appropriately.     2) *do not use this system to send urgent messages* (anything needing a response in less than 48 hours).  If you have an urgent need, please call the number above.     Questions About Your Visit?     If you have any questions about any information included in this visit summary, please contact your provider by sending a secure message using My [facility] or by phone at the number listed above. Bring this form with you to your next scheduled appointment as a reminder to discuss with your provider. Use this form to make notes about any medications, including over-the-counter medications, you have stopped or started taking, or any questions you have about any test or procedure results.    Medication Refills: please do not send urgent refill requests through MyChart.  Please call the numbers above.

## 2022-11-27 LAB — EHRLICHIA PCR: EHRLICHIA PCR: NEGATIVE

## 2022-11-27 NOTE — Unmapped (Signed)
Lab on 11/26/2022   Component Date Value Ref Range Status    Sodium 11/26/2022 142  135 - 145 mmol/L Final    Potassium 11/26/2022 4.6  3.4 - 4.8 mmol/L Final    Chloride 11/26/2022 113 (H)  98 - 107 mmol/L Final    CO2 11/26/2022 25.0  20.0 - 31.0 mmol/L Final    Anion Gap 11/26/2022 4 (L)  5 - 14 mmol/L Final    BUN 11/26/2022 23  9 - 23 mg/dL Final    Creatinine 16/03/9603 0.93  0.73 - 1.18 mg/dL Final    BUN/Creatinine Ratio 11/26/2022 25   Final    eGFR CKD-EPI (2021) Male 11/26/2022 82  >=60 mL/min/1.49m2 Final    eGFR calculated with CKD-EPI 2021 equation in accordance with [employer] of Nephrology Task Force recommendations.    Glucose 11/26/2022 94  70 - 179 mg/dL Final    Calcium 54/02/8118 9.6  8.7 - 10.4 mg/dL Final    Albumin 14/78/2956 3.4  3.4 - 5.0 g/dL Final    Total Protein 11/26/2022 5.7  5.7 - 8.2 g/dL Final    Total Bilirubin 11/26/2022 0.4  0.3 - 1.2 mg/dL Final    AST 21/30/8657 28  <=34 U/L Final    ALT 11/26/2022 27  10 - 49 U/L Final    Alkaline Phosphatase 11/26/2022 96  46 - 116 U/L Final    Magnesium 11/26/2022 1.9  1.6 - 2.6 mg/dL Final    Phosphorus 84/69/6295 3.0  2.4 - 5.1 mg/dL Final    LDH 28/41/3244 165  120 - 246 U/L Final    Uric Acid 11/26/2022 5.2  3.7 - 9.2 mg/dL Final    WBC 06/25/7251 10.5  3.6 - 11.2 10*9/L Final    RBC 11/26/2022 3.60 (L)  4.26 - 5.60 10*12/L Final    HGB 11/26/2022 11.8 (L)  12.9 - 16.5 g/dL Final    HCT 66/44/0347 34.4 (L)  39.0 - 48.0 % Final    MCV 11/26/2022 95.6  77.6 - 95.7 fL Final    MCH 11/26/2022 32.7 (H)  25.9 - 32.4 pg Final    MCHC 11/26/2022 34.2  32.0 - 36.0 g/dL Final    RDW 42/59/5638 15.7 (H)  12.2 - 15.2 % Final    MPV 11/26/2022 9.1  6.8 - 10.7 fL Final    Platelet 11/26/2022 140 (L)  150 - 450 10*9/L Final    Neutrophils % 11/26/2022 37.1  % Final    Lymphocytes % 11/26/2022 51.3  % Final    Monocytes % 11/26/2022 7.3  % Final    Eosinophils % 11/26/2022 3.7  % Final    Basophils % 11/26/2022 0.6  % Final    Absolute Neutrophils 11/26/2022 3.9  1.8 - 7.8 10*9/L Final    Absolute Lymphocytes 11/26/2022 5.4 (H)  1.1 - 3.6 10*9/L Final    Absolute Monocytes 11/26/2022 0.8  0.3 - 0.8 10*9/L Final    Absolute Eosinophils 11/26/2022 0.4  0.0 - 0.5 10*9/L Final    Absolute Basophils 11/26/2022 0.1  0.0 - 0.1 10*9/L Final    Smear Review Comments 11/26/2022 See Comment (A)  Undefined Final    Slide reviewed.   Instrument ID   [id number]

## 2022-11-27 NOTE — Unmapped (Signed)
Patient arrived for obinutuzumab infusion. Pre meds given as ordered and patient sent to [place] for PVLs. Infusion initiated and rate increased per protocol. Patient completed and tolerated treatment.  AVS declined and patient discharged to home.

## 2022-12-01 LAB — SPOTTED FEVER GROUP RICKETTSIA IGG ACUTE: RMSF SCREEN TITER: <1:128 {titer} (ref <1:128)

## 2022-12-02 LAB — EHRLICHIA ANTIBODY: EHRLICHIA ACUTE IFA: NONREACTIVE (ref <1:128)

## 2022-12-04 ENCOUNTER — Ambulatory Visit: Payer: Medicare Other | Admitting: Physician Assistant

## 2022-12-04 ENCOUNTER — Encounter: Payer: Self-pay | Admitting: Physician Assistant

## 2022-12-04 DIAGNOSIS — R194 Change in bowel habit: Secondary | ICD-10-CM

## 2022-12-04 DIAGNOSIS — C9110 Chronic lymphocytic leukemia of B-cell type not having achieved remission: Secondary | ICD-10-CM | POA: Diagnosis not present

## 2022-12-04 DIAGNOSIS — R195 Other fecal abnormalities: Secondary | ICD-10-CM

## 2022-12-04 MED ORDER — NA SULFATE-K SULFATE-MG SULF 17.5-3.13-1.6 GM/177ML PO SOLN: 1.0000 | ORAL | 0 refills | Status: AC

## 2022-12-04 NOTE — Progress Notes (Signed)
Subjective:    Patient ID: Wesley [name], male    DOB: 12-18-1940, 82 y.o.   MRN: [id number]  HPI [name] is a pleasant 82 year old white male, established with Dr. Adela Lank.  He was last seen here in August 2021 at that time with complaints of dysphagia. He comes in today stating that his oncologist has suggested that he should have EGD and colonoscopy.  Patient was diagnosed with a B-cell lymphoma a couple of years ago and according to the patient and his girlfriend/significant other had been on observation only.  He was then seen by a different oncologist Dr. Gavin Pound [facility] oncology and due to progression has recently been started on a chemotherapy regimen as of March 2024.  He is also on Calquence orally.  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  He was H. pylori negative. Last colonoscopy 2018 done for history of adenomatous colon polyps with multiple left-sided diverticuli and moderate internal hemorrhoids, no polyps He had a very tortuous colon with significant looping, and 1 area of subtle nodularity at prior hemorrhoidal scar site.  This was biopsied and consistent with a hyperplastic polyp.  No further surveillance was planned due to age.  Patient has multiple significant comorbidities, with history of prior CVA for which he is maintained on Plavix,, congestive heart failure, nonischemic cardiomyopathy with EF previously at 35 to 40%, (2020) coronary artery disease status post remote CABG chronic hypotension currently on midodrine, adult onset diabetes mellitus, dilated aortic root, history of prostate cancer status post radiation, more recent bladder tumor with cystoscopy -most recent  cystoscopy 10/22/2022 no recurrent disease.  And early Parkinson's. Most of his records are in care everywhere, his cardiologist is Dr. Olevia Perches,, and his PCP is Dr. Worthy Keeler with [facility]. Patient says he is overdue for a follow-up echo  He continues to complain of some intermittent mild solid food dysphagia though not as severe as it had been previously according to the patient.  This is intermittent.  Appetite has been okay, weight has been stable.  He says he is tolerating the chemotherapy well and has not had any significant side effects has been able to work etc.  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 He has had some lower pelvic pain recently, attributes that to bladder issues.  He is having daily bowel movements but says that he does not feel that he evacuates his bowel well and that he has had narrow caliber stools over the past year.  Complains of increased gassiness frequently, no melena or hematochezia. I do not see any recent abdominal imaging, this year. Oncology notes reviewed, Dr. 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  Also has an iron deficiency has been started on oral iron.  Review of Systems. Pertinent positive and negative review of systems were noted in the above HPI section.  All other review of systems was otherwise negative.   Outpatient Encounter Medications as of 12/04/2022  Medication Sig   Ascorbic Acid (VITAMIN C) 1000 MG tablet Take 1,000 mg by mouth daily.    aspirin EC 81 MG  tablet Take 81 mg by mouth daily with lunch.   atorvastatin (LIPITOR) 80 MG tablet Take 80 mg by mouth at bedtime.   FEROSUL 325 (65 Fe) MG tablet Take by mouth.   Lysine 1000 MG TABS Take 1,000 mg by mouth daily.   magnesium oxide (MAG-OX) 400 MG tablet Take 400 mg by mouth daily.    midodrine (PROAMATINE) 5 MG tablet Take 5 mg by mouth 3 (three) times daily.   Na Sulfate-K Sulfate-Mg Sulf (SUPREP BOWEL PREP KIT) 17.5-3.13-1.6 GM/177ML SOLN Take 1 kit by mouth as directed.   spironolactone (ALDACTONE) 25 MG tablet Take by mouth.   tamsulosin (FLOMAX) 0.4 MG CAPS capsule Take 1 capsule (0.4 mg total) by mouth daily after supper.   zinc gluconate 50 MG tablet Take 50 mg by mouth daily.   acetaminophen (TYLENOL) 325 MG tablet Take 650 mg by mouth every 6 (six) hours as needed for mild pain or moderate pain. (Patient not taking: Reported on 12/04/2022)   B Complex Vitamins (B COMPLEX 1 PO) Take 1 tablet by mouth daily. (Patient not taking: Reported on 12/04/2022)   CALQUENCE 100 MG tablet Take by mouth. (Patient not taking: Reported on 12/04/2022)   carvedilol (COREG) 6.25 MG tablet Take 6.25 mg by mouth 2 (two) times daily with a meal.  (Patient not taking: Reported on 12/04/2022)   CRANBERRY PO Take 84 mg by mouth daily. (Patient not taking: Reported on 12/04/2022)   DOCUSATE SODIUM PO Take 480 mg by mouth at bedtime as needed for constipation. (Patient not taking: Reported on 12/04/2022)   furosemide (LASIX) 20 MG tablet Take 20 mg by mouth daily as needed for edema (Weight gain greater than 2 lbs). (Patient not taking: Reported on 12/04/2022)   GLUCOSAMINE SULFATE PO Take 1 tablet by mouth daily. (Patient not taking: Reported on 12/04/2022)   Lactobacillus (PROBIOTIC ACIDOPHILUS PO) Take 1 tablet by mouth daily. (Patient not taking: Reported on 12/04/2022)   metoprolol succinate (TOPROL-XL) 25 MG 24 hr tablet TAKE 1/2 TABLET( 12.5 MG TOTAL) BY MOUTH ONCE DAILY (Patient not taking: Reported on 12/04/2022)   Multiple Vitamins-Minerals (MULTIVITAMIN WITH MINERALS) tablet Take 1 tablet by mouth daily. 50 + (Patient not taking: Reported on 12/04/2022)   pramipexole (MIRAPEX) 0.5 MG tablet Take 0.5 mg by mouth at bedtime. (Patient not taking: Reported on 12/04/2022)   sacubitril-valsartan  (ENTRESTO) 24-26 MG Take 1 tablet by mouth every 12 (twelve) hours. (Patient not taking: Reported on 12/04/2022)   tetrahydrozoline 0.05 % ophthalmic solution Place 1 drop into both eyes daily as needed (for dry eyes). (Patient not taking: Reported on 12/04/2022)   timolol (TIMOPTIC) 0.5 % ophthalmic solution Place 1 drop into both eyes at bedtime. (Patient not taking: Reported on 12/04/2022)   traMADol (ULTRAM) 50 MG tablet Take 50 mg by mouth every 12 (twelve) hours. May take additional 50 mg if in extreme pain (Patient not taking: Reported on 12/04/2022)   vitamin B-12 (CYANOCOBALAMIN) 1000 MCG tablet Take 1,000 mcg by mouth daily. (Patient not taking: Reported on 12/04/2022)   No facility-administered encounter medications on file as of 12/04/2022.   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/2021   Goals of care, counseling/discussion 06/27/2021   History of prostate cancer 06/27/2021   Normocytic anemia 06/27/2021   Lymphadenopathy 06/27/2021   Cervicalgia 08/16/2020   Chronic pain of both shoulders 08/16/2020   Chronic bilateral low back pain without sciatica 08/16/2020  Primary osteoarthritis of left knee 08/16/2020   Primary osteoarthritis of right knee 08/16/2020   Chronic pain syndrome 08/16/2020   Primary parkinsonism 04/16/2020   Thoracic aortic aneurysm without rupture (HCC) 10/31/2019   Chronic systolic CHF (congestive heart failure) (HCC) 10/02/2019   Dilated aortic root (HCC) 10/02/2019   S/P right knee arthroscopy 01/05/2019   Multinodular goiter 08/19/2018   Ganglion and cyst of synovium, tendon and bursa 05/17/2018   CAD (coronary artery disease) 05/17/2018   Carpal tunnel syndrome 05/17/2018   Chronic back pain 05/17/2018   History of stroke 05/17/2018   Synovitis and tenosynovitis, unspecified 05/17/2018   Vitamin D deficiency 05/17/2018   History of bilateral knee replacement 05/17/2018    Lymphocytosis 02/21/2016   Malignant neoplasm of prostate (HCC) 11/20/2015   TIA 03/27/2010   HYPERLIPIDEMIA 10/04/2009   HYPERTENSION 10/04/2009   S/P CABG x 4 10/04/2009   GERD 10/04/2009   PYELONEPHRITIS 10/04/2009   PSA, INCREASED 10/04/2009   ADENOCARCINOMA, BONE, JAW, LOWER, HX OF 10/04/2009   HEMORRHOIDECTOMY, HX OF 10/04/2009   Social History   Socioeconomic History   Marital status: Significant Other    Spouse name: Dolan Amen   Number of children: 3   Years of education: Not on file   Highest education level: Not on file  Occupational History   Occupation: attorney    Comment: still work  Tobacco Use   Smoking status: Never    Passive exposure: Never   Smokeless tobacco: Never  Vaping Use   Vaping Use: Never used  Substance and Sexual Activity   Alcohol use: Yes    Comment: occasional beer every 3-4 week   Drug use: Never   Sexual activity: Not Currently  Other Topics Concern   Not on file  Social History Narrative   Lives alone.   Social Determinants of Health   Financial Resource Strain: Not on file  Food Insecurity: Not on file  Transportation Needs: Not on file  Physical Activity: Not on file  Stress: Not on file  Social Connections: Not on file  Intimate Partner Violence: Not on file    Mr. Timbers family history includes Alzheimer's disease in his father; Diabetes in his paternal grandfather; Heart disease in his mother; Parkinsonism in his father; Stomach cancer in his cousin.      Objective:    Vitals:   12/04/22 1455  BP: 98/64  Pulse: 74    Physical Exam Well-developed well-nourished f elderly white male in no acute distress,, accompanied by his significant other, Weight, 165 BMI 26.6  HEENT; nontraumatic normocephalic, EOMI, PE R LA, sclera anicteric. Oropharynx; not examined today Neck; supple, no JVD Cardiovascular; regular rate and rhythm with S1-S2, no murmur rub or gallop, normal incisional scar Pulmonary; Clear  bilaterally Abdomen; soft, there is some mild tenderness across the lower pelvis, nondistended, no palpable mass or hepatosplenomegaly, bowel sounds are active, epigastric incisional ports Rectal; not done today Skin; benign exam, no jaundice rash or appreciable lesions Extremities; no clubbing cyanosis or edema skin warm and dry Neuro/Psych; alert and oriented x4, grossly nonfocal mood and affect appropriate        Assessment & Plan:   #71 82 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 Lawson Radar for EGD and colonoscopy.  Per patient to complete chemotherapy in July. On further review and after review of records after patient had been evaluated he has complained there of intermittent dysphagia and does  have history of a distal esophageal stricture which had been dilated in November 2020.  I am not sure there is a specific indication for repeat colonoscopy for surveillance given his advanced age and multiple comorbidities, though this was suggested by oncology due to increased risk of other cancers etc.  Patient does have complaint of narrower stool caliber recently and lower abdominal discomfort. Cannot see recent abdominal imaging.  Plan here had been for no further surveillance colonoscopies after last colon in 2018 with no recurrent adenomatous polyps, tortuous bowel, and 1 subtle nodule associated with a hemorrhoid which was a hyperplastic polyp.  #2 combined congestive heart failure/ischemic cardiomyopathy-EF 35 to 40%/2020 On Entresto #3 prior history of CVA/TIA-on Plavix #4 chronic hypotension on midodrine #5 adult onset diabetes mellitus #6 coronary artery disease status post CABG #7 dilated aortic root  Plan; With chronic issues with hypotension requiring midodrine, and previous echo showing EF of 35 to 40% (needs repeat echo overdue), procedures here will need to be scheduled at the hospital with  Dr. Adela Lank. At this point have scheduled him for EGD with probable dilation and colonoscopy with Dr. Adela Lank in August.  Patient had some specifications regarding times as he will be in trial for a portion of August. Discussed with the patient and his significant other that he is at increased/high risk for complications with sedation, he acknowledges and wishes to proceed with what ever procedures are advised.  Plavix will need to be held for 5 days prior to procedures-we will communicate with both his cardiologist Dr. Vennie Homans and his PCP Dr. Graciela Husbands to assure this is reasonable for this patient.  I will ask Dr. Adela Lank to review regarding indication to proceed with repeat colonoscopy.  I have asked him to try taking a half a dose of MiraLAX at bedtime or every other day to help with bowel evacuation.   Jhovany Weidinger S Joleah Kosak PA-C 12/04/2022   Cc: Lynnea Ferrier, MD

## 2022-12-04 NOTE — Patient Instructions (Signed)
_______________________________________________________  If your blood pressure at your visit was 140/90 or greater, please contact your primary care physician to follow up on this. _______________________________________________________  If you are age 82 or older, your body mass index should be between 23-30. Your Body mass index is 26.63 kg/m. If this is out of the aforementioned range listed, please consider follow up with your Primary Care Provider. ________________________________________________________  The [facility] providers would like to encourage you to use [facility] to communicate with providers for non-urgent requests or questions.  Due to long hold times on the telephone, sending your provider a message by [facility] may be a faster and more efficient way to get a response.  Please allow 48 business hours for a response.  Please remember that this is for non-urgent requests.  _______________________________________________________  Try 1/2 dose of Miralax daily or every other day.  You have been scheduled for an endoscopy and colonoscopy. Please follow the written instructions given to you at your visit today. Please pick up your prep supplies at the pharmacy within the next 1-3 days. If you use inhalers (even only as needed), please bring them with you on the day of your procedure.  Due to recent changes in healthcare laws, you may see the results of your imaging and laboratory studies on MyChart before your provider has had a chance to review them.  We understand that in some cases there may be results that are confusing or concerning to you. Not all laboratory results come back in the same time frame and the provider may be waiting for multiple results in order to interpret others.  Please give [place] 48 hours in order for your provider to thoroughly review all the results before contacting the office for clarification of your results.   Thank you for entrusting me with your care and  choosing [facility].  Amy Esterwood, PA-C

## 2022-12-05 NOTE — Progress Notes (Signed)
Agree with assessment and plan as outlined.  EGD is reasonable given dysphagia and history of esophageal stricture.  He last had a colonoscopy in 2018 without any adenomas / pre-cancerous polyps. Don't feel he needs a colonoscopy for routine surveillance unless his Oncologist is strongly recommending this for other reasons. Amy can you clarify why they want a colonoscopy? His iron is just slightly low but normocytic anemia and normal ferritin. Thanks

## 2022-12-10 ENCOUNTER — Telehealth: Payer: Self-pay

## 2022-12-10 NOTE — Telephone Encounter (Signed)
Patient advised that he has been given clearance to hold Plavix 5 days prior to egd scheduled for 02-10-23.  Patient advised to take last dose of Plavix on 02-04-23, and he will be advised when to restart Plavix by Dr Adela Lank after the procedure.  Patient agreed to plan and verbalized understanding.  No further questions.

## 2022-12-11 NOTE — Progress Notes (Signed)
Agree with assessment and plan as outlined.  If he has an iron deficiency reasonable to proceed with both EGD and colonoscopy at the hospital, although colonoscopy may be lower yield given no concerning pathology on his last exam.

## 2022-12-23 NOTE — Unmapped (Signed)
 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 male with a history of multiple cancers, who presents for follow-up of biphenotypic LPD.    #Biphenotypic B-cell lymphoproliferative disorder  -He has 2 distinct abnormal monoclonal B-cell populations.  One is classic for CLL with CD5+ and dim CD20 and kappa restricted.  The other is more consistent with marginal zone lymphoma which is CD5-/CD10-/CD20+ and lambda restricted.  As all pathology has been equal distribution of both cell types, it is unclear which or if both are contributing equally to the progressive lymphocytosis.  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  -Due to rapidly progressive lymphocytosis and progressive lymphadenopathy and anemia, he meets criteria for treatment.  -Interestingly, his peripheral blood flow from 07/23/22 showed 60% of the CD5- population and 10% of the CD5+ population, while the bone marrow biopsy from 08/06/22 demonstrated 90% of the CD5+ (although both populations were detected by [facility] sequencing)  -Previously discussed potential treatment options, but the best option that would be effective against both clones is the combination of obinutuzumab  and acalabrutinib  as given for frontline CLL therapy in the ELEVATE [place] study. Obinutuzumab  would be given for 6 months and acalabrutinib  would be given continuously.  -Potential side effects include but are not limited to infusion reaction, infection, neutropenia, diarrhea, nausea, heartburn, joint or muscle aches, bleeding, bruising, atrial fibrillation, hypertension, and headache.  -He initiated therapy with obinutuzumab  and acalabrutinib  09/03/22.   -His labs are appropriate to proceed with treatment today.    #Tick Bite Pt with multiple tick bites in early June, one of which was buried in skin for 3-4 days. Tick was removed in entirety and since removal patient with increased fatigue. No fevers, chills, HA, photosensitivity, neck pain.  - 11/26/22 Lyme, Ehrlichia, RMSF PCR negative  - Empirically treated with doxycycline  x 7 days- patient did not complete the course    #LLE pain: Noted at last visit: 2 day history pain to the L posterior knee after sitting. Pain remains when on stairs and getting and out of truck  - Not discussed today  - PVL to r/o DVT, negative                                                  #Anemia  -Likely related to B-cell malignancy as above.  -Also iron  deficient.  Recommended increase iron  supplementation to ferrous sulfate  325mg  every other day.  Recheck 09/03/22 with continued low Tsat at 14% and Ferritin at 40.  Hgb down to 10.  He received IV iron  on 10/03/22.  There is mild improvement in Fe panel. We will continue with PO supplementation.   -He had a colonoscopy  in 2022 and said that he had polyps and would have to return in 2027 for next one.  He had consultation visit on 6/13 with Dr. Leigh at [facility] in [place] for EGD/colonoscopy, scheduled for 8/20. Request records when available.  -Also of note, he previously had a bladder tumor that was causing bleeding.  He underwent repeat cystoscopy on 10/22/22 with Dr. Redell Burnet.  No evidence of recurrent disease.  He has requested repeat cystoscopy in 9 months, which should be ~2/25.  -We have reviewed with him that acalabrutinib  can worsen bleeding and if he notices any new bleeding, he knows to contact us  immediately.    #Dysphagia to solids   -Previously needed an esophageal dilation.  -EGD scheduled 8/20 now that established with local GI    #Rash, resolved  Eruption involving left > right side of back and right leg. No clear vesicles were noted, and the rash crosses the midline. Will prescribe topical steroid for possible drug-related eruption. Advised patient to alert us  to worsening or evolution of rash. Mild ecchymotic appearance to R anterior shin, no distinct raised features.    #Falls:  - Fall on deck last week, did not hit head/LOC. No bone/joint pain.  - Falls typically 1 times per month. Notes poor balance s/p CVA in 2011. Did to PT for a short time 3 years ago but does not feel it was helpful nor does not have the time to continue.   - He declined PT and Dr Nemiah at this time given time commitment required    #Skin excoriation left temple.  -Recommended dermatology evaluation as this is suspicious for SCC.  He has a Ship broker who he will contact. He has an appt on 02/21/23   [facility] internal referral placed however patient is not able to make it to these appts on Tuesdays therefore will keep appt with local Derm.    #Multiple medical comorbidities.  -Managed by PCP and other specialists.      #. Preventive  Cancer screening:  Patients with CLL/SLL are at increased risk of developing secondary malignancies.  Patient is not currently due for screening.  Annual dermatology evaluation, due to increased risk of non-melanomatous skin cancers.   If needed, blood products for transfusion should be irradiated.  Risk of Hypogammaglobulinemia: Baseline IgG levels 03/11/19 = 698, 07/23/22 1095  Vaccination  Avoid all live vaccines.  Recommend annual influenza vaccine.  Last given: 05/28/22  Recommend COVID vaccine series and booster.  Last given:  04/13/20  Recommend pneumococcal vaccine with Prevnar 20.  Will need to receive this 6 months after completion of obinutuzumab .  Recommend RSV vaccine. Last given:  07/18/22  Recommend Zoster vaccine recombinant, adjuvanted (Shingrix).  Last given: 01/18/13 (chart lists as zostavax, will need to confirm)     -RTC in one month for next cycle        Lowella Harvard, MSN, FNP-BC, [facility]  Nurse Practitioner  [facility]/Oncology       In total I spent 50 minutes in this visit which consisted of collecting a history and making medical management decisions for the diagnoses listed in my note, as well as reviewing and updating the patient's medical record, coordinating care, and documenting before, during and after the visit.     Barnie EMERSON Drones, DO  Associate Professor of Medicine  Division of Hematology  [facility]  [facility]        Chief Complaint:  Chief Complaint   Patient presents with    Routine Follow-up       Oncology/Treatment History:     Hematology/Oncology History   CLL (chronic lymphocytic leukemia) (CMS-HCC)   05/21/2021 Initial Diagnosis    CLL (chronic lymphocytic leukemia) (CMS-HCC)  -While he was officially diagnosed in 2022, he had a mild lymphocytosis dating back to 2017, when he was undergoing radiation for his prostate cancer.  On 02/08/2016, he had a flow cytometry, which showed a small population of monoclonal B-cells that were CD5-/CD10- with a total of 2.9k/uL clonal cells.  Observation was recommended.   -further evaluation of asymptomatic lymphocytosis ensued 05/21/2021.  -WBC 20.1, Hgb 12.5, Plt 238, ALC 13.8, ANC 3.96, B2M Not done, LDH 116 (ULN 192)  -Flow showed monoclonal B-cell population consistent with CLL with absolute clonal count of 3.74 k/UL, however a separate population of CD5-/CD10- monoclonal B-cells was detected  -Rai Stage: 1  -IGHV: Unmutated  -FISH showed: Normal  -Karyotype showed: Not completed.  -Recommendation: observation       07/01/2021 Interval Scan(s)    PET scan with scattered LAD, max size 2.0cm, No hepatosplenomegaly. 07/05/2021 Biopsy    Bone marrow biopsy showed hypercellular bone marrow with involvement of a low-grade B-cell lymphoproliferative disorder.  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 Ki-67 stain is variable in the interfollicular areas, 10% to 30%.   There are two separate B cell clones, in roughly equal proportions:   1) Kappa light chain restricted (dim expression), CD20+ (dim), CD5+, CD23+, CD43+, FMC-   2) Lambda light chain restricted, CD20+, CD5-, CD23+, CD43-, FMC+(dim).      06/26/2022 Interval Scan(s)    PET scan with continued LAD with mild progression.  Largest node 2.4cm in mesentery.     07/23/2022 Molecular Markers    -FISH showed: Normal  -Karyotype showed: 46,XY,del(7)(q31q32)[14]/46,XY[6]   -TP53 mutation was detected c.375G>A      08/05/2022 Biopsy    Bone marrow biopsy:  -  Hypercellular bone marrow (90%) primarily involved by CD5+ B-cell lymphoma/leukemia, consistent with chronic lymphocytic leukemia, representing approximately 70% of marrow cellularity by immunohistochemistry (see Comment)  -  Clonal immunoglobulin heavy (IGH) and kappa light chain (IGK) gene rearrangements identified (see Comment)            History Obtained From:   Patient and his significant other.    History of Present Illness:   ADLAI SINNING is a 82 y.o. male with a history of multiple cancers, who presents with progressive lymphocytosis due to his lymphoproliferative disorder and consultation for treatment recommendations.  For detailed oncologic and treatment (if applicable) history, please see above.     Interim History   Since his last visit, Mr. Branam he is doing well but has noticed generalized more tiredness but still extremely active and works outdoors quite a bit. Denies f/c/ns. Mild abd pain with 3 small stools a day, not diarrhea but soft. Denies n/v/d.    Sustained ant bites to the chest.    L leg pain did not come up this visit.    Fall last week on deck, did not hit head/LOC.     He denies new constitutional symptoms such as anorexia, weight loss, night sweats or unexplained fevers.  Furthermore, he denies symptoms of marrow failure: unexplained bleeding or bruising, recurrent or unexplained intercurrent infections, dyspnea on exertion, lightheadedness, palpitations or  chest pain.  There have been no new or unexplained pains or self-identified masses, swelling or enlarged lymph nodes.     Review of Systems   Complete ROS performed and was negative except what is noted in interim history above.    Past Medical History     Past Medical History:   Diagnosis Date    Bladder cancer (CMS-HCC) 09/13/2021    CAD (coronary artery disease)     CLL (chronic lymphocytic leukemia) (CMS-HCC)     GERD (gastroesophageal reflux disease)     Hyperlipidemia     Hypertension     Marginal zone B-cell lymphoma (CMS-HCC)     Multinodular goiter     OSA (obstructive sleep apnea)     Prostate cancer (CMS-HCC) 10/2015    Stroke (CMS-HCC) 2011       Past Surgical History:     Past Surgical History:   Procedure Laterality Date    BIOPSY OF THYROID [facility] HISTORICAL RESULT)      BONE MARROW BIOPSY      CARPAL TUNNEL RELEASE      CATARACT EXTRACTION Bilateral     CORONARY ARTERY BYPASS GRAFT  2005    HEMORRHOID SURGERY  2002    LYMPH NODE BIOPSY  07/08/2021    PAROTIDECTOMY Left 08/04/2017    TONSILLECTOMY AND ADENOIDECTOMY      TOTAL KNEE ARTHROPLASTY Left 2014    TOTAL KNEE ARTHROPLASTY Right 05/17/2018    TRANSURETHRAL RESECTION OF BLADDER TUMOR  09/13/2021       Allergies:     No Known Allergies      Family History:     Family History   Problem Relation Age of Onset    Heart disease Mother     Parkinsonism Father     Alzheimer's disease Father     Diabetes Paternal Grandfather        Social History:     Social History     Tobacco Use    Smoking status: Never    Smokeless tobacco: Never   Vaping Use    Vaping status: Never Used Substance Use Topics    Alcohol use: Not Currently    Drug use: Never       Medications:     Current Outpatient Medications   Medication Sig Dispense Refill    acalabrutinib  (CALQUENCE ) 100 mg tablet Take 1 tablet (100 mg total) by mouth two (2) times a day. Swallow whole with water. Do not to chew, crush, dissolve, or cut tablets. 60 tablet 11    acetaminophen  (TYLENOL ) 325 MG tablet Take 1.5 tablets (487.5 mg total) by mouth nightly. 100 tablet 2    allopurinol  (ZYLOPRIM ) 300 MG tablet Take 1 tablet (300 mg total) by mouth daily. 30 tablet 1    ascorbic acid, vitamin C, (VITAMIN C) 1000 MG tablet Take 1 tablet (1,000 mg total) by mouth.      aspirin (ECOTRIN) 81 MG tablet Take 1 tablet (81 mg total) by mouth.      atorvastatin (LIPITOR) 80 MG tablet       b complex vitamins (SUPER B-50 COMPLEX) capsule Take 1 capsule by mouth daily. 30 capsule 11    bimatoprost (LUMIGAN) 0.01 % Drop Administer 1 drop to both eyes nightly. 30 mL 0    cholecalciferol, vitamin D3-250 mcg, 10,000 unit,, 250 mcg (10,000 unit) capsule Take 0.5 capsules (125 mcg total) by mouth daily. 120 capsule 2    coenzyme Q10 200 mg capsule Take 1 capsule (200 mg  total) by mouth.      cyanocobalamin, vitamin B-12, 1000 MCG tablet Take 0.5 tablets (500 mcg total) by mouth daily. 30 tablet 0    ENTRESTO 24-26 mg tablet Take 1 tablet by mouth.      ferrous sulfate  325 (65 FE) MG tablet Take 1 tablet (325 mg total) by mouth every other day. 30 tablet 11    fish,bora,flax oils-om3,6,9no1 (OMEGA 3-6-9) 1,200 mg cap Take 1 tablet by mouth in the morning. 30 capsule 0    L.acidophil-L.plantar-Bifido 7 15 billion cell cap Take 5 mg by mouth in the morning. 30 capsule 0    lysine 1,000 mg Tab Take 1,000 mg by mouth.      magnesium gluconate (MAGONATE) 500 mg (27 mg elem magnesium) tablet Take 1 tablet by mouth daily. 30 tablet 0    midodrine (PROAMATINE) 5 MG tablet Take 1 tablet (5 mg total) by mouth Three (3) times a day.      mv-min-FA-vit K-lutein-zeaxant (PRESERVISION AREDS 2 PLUS MV) 200 mcg-15 mcg- 5 mg-1 mg cap Take 1 tablet by mouth in the morning. 30 capsule 0    NON FORMULARY Astralagus, Black elderberry, reishii      ondansetron  (ZOFRAN ) 4 MG tablet Take 1 tablet (4 mg) by mouth once a day as needed for nausea. 30 tablet 0    selenium 200 mcg Tab Take 1 tablet (200 mcg total) by mouth.      spironolactone (ALDACTONE) 25 MG tablet Take 1 tablet (25 mg total) by mouth daily.      tamsulosin (FLOMAX) 0.4 mg capsule       traMADol (ULTRAM) 50 mg tablet TAKE 1 TO 2 TABLETS BY MOUTH THREE TIMES DAILY AS NEEDED FOR PAIN      triamcinolone  (KENALOG ) 0.1 % ointment Apply topically two (2) times a day. 30 g 0    valACYclovir  (VALTREX ) 500 MG tablet Take 1 tablet (500 mg total) by mouth daily. 30 tablet 5    zinc gluconate 50 mg (7 mg elemental zinc) tablet Take 1 tablet (50 mg total) by mouth daily.       No current facility-administered medications for this visit.       Physical Exam:     PHYSICAL EXAMINATION:  ECOG: 2   VITAL SIGNS:  BP 80/40  - Pulse 71  - Temp 36.5 ??C (97.7 ??F) (Oral)  - Resp 16  - Ht 167.6 cm (5' 6)  - Wt 76.1 kg (167 lb 12.3 oz)  - SpO2 95%  - BMI 27.08 kg/m??     General: well appearing, in no acute distress  HEENT: Sclera anicteric. Conjunctivae not injected, no neck pain or rigidity noted.  Resp: Normal WOB, symmetric excursion, CTAB  CV: RRR without murmurs, rubs or gallops.    GI: Normal bowel sounds, abdomen soft, non-tender, non-distended  Skin: Scattered ant bites with associated erythema to the ant torso  Neuro: Alert and oriented to conversation, EOM intact, no facial droop, speech fluent and coherent, moves all extremities without asymmetry and with at least antigravity strength  Psych: Normal affect  Extremities: No clubbing or lower extremity edema appreciated.  Mild compression stocking imprints apparent to bilateral to shin.  LYMPH: No palpable cervical, supraclavicular, axillary, inguinal lymphadenopathy. Labs:   See oncology history      Pathology:   See oncology history    Imaging:   I personally reviewed all of the patient's pertinent images and documented any relevant findings in the assessment/plan.  See outside  records.    No new pertinent imaging today.

## 2022-12-24 ENCOUNTER — Ambulatory Visit: Admit: 2022-12-24 | Discharge: 2022-12-25 | Payer: MEDICARE

## 2022-12-24 ENCOUNTER — Ambulatory Visit: Admit: 2022-12-24 | Discharge: 2022-12-25 | Payer: MEDICARE | Attending: Hematology | Primary: Hematology

## 2022-12-24 ENCOUNTER — Other Ambulatory Visit: Admit: 2022-12-24 | Discharge: 2022-12-25 | Payer: MEDICARE

## 2022-12-24 DIAGNOSIS — C9110 Chronic lymphocytic leukemia of B-cell type not having achieved remission: Principal | ICD-10-CM

## 2022-12-24 DIAGNOSIS — Z5111 Encounter for antineoplastic chemotherapy: Principal | ICD-10-CM

## 2022-12-24 LAB — COMPREHENSIVE METABOLIC PANEL
ALBUMIN: 3.5 g/dL (ref 3.4–5.0)
ALKALINE PHOSPHATASE: 105 U/L (ref 46–116)
ALT (SGPT): 20 U/L (ref 10–49)
ANION GAP: 4 mmol/L — ABNORMAL LOW (ref 5–14)
AST (SGOT): 28 U/L (ref <=34)
BILIRUBIN TOTAL: 0.6 mg/dL (ref 0.3–1.2)
BLOOD UREA NITROGEN: 23 mg/dL (ref 9–23)
BUN / CREAT RATIO: 23
CALCIUM: 9.7 mg/dL (ref 8.7–10.4)
CHLORIDE: 113 mmol/L — ABNORMAL HIGH (ref 98–107)
CO2: 26 mmol/L (ref 20.0–31.0)
CREATININE: 1.01 mg/dL
EGFR CKD-EPI (2021) MALE: 74 mL/min/{1.73_m2} (ref >=60)
GLUCOSE RANDOM: 115 mg/dL (ref 70–179)
POTASSIUM: 4.4 mmol/L (ref 3.4–4.8)
PROTEIN TOTAL: 5.8 g/dL (ref 5.7–8.2)
SODIUM: 143 mmol/L (ref 135–145)

## 2022-12-24 LAB — CBC W/ AUTO DIFF
BASOPHILS ABSOLUTE COUNT: 0 10*9/L (ref 0.0–0.1)
BASOPHILS RELATIVE PERCENT: 0.4 %
EOSINOPHILS ABSOLUTE COUNT: 0.5 10*9/L (ref 0.0–0.5)
EOSINOPHILS RELATIVE PERCENT: 5.6 %
HEMATOCRIT: 35.9 % — ABNORMAL LOW (ref 39.0–48.0)
HEMOGLOBIN: 12.4 g/dL — ABNORMAL LOW (ref 12.9–16.5)
LYMPHOCYTES ABSOLUTE COUNT: 3.7 10*9/L — ABNORMAL HIGH (ref 1.1–3.6)
LYMPHOCYTES RELATIVE PERCENT: 39.8 %
MEAN CORPUSCULAR HEMOGLOBIN CONC: 34.6 g/dL (ref 32.0–36.0)
MEAN CORPUSCULAR HEMOGLOBIN: 33.4 pg — ABNORMAL HIGH (ref 25.9–32.4)
MEAN CORPUSCULAR VOLUME: 96.6 fL — ABNORMAL HIGH (ref 77.6–95.7)
MEAN PLATELET VOLUME: 9.2 fL (ref 6.8–10.7)
MONOCYTES ABSOLUTE COUNT: 0.8 10*9/L (ref 0.3–0.8)
MONOCYTES RELATIVE PERCENT: 8.3 %
NEUTROPHILS ABSOLUTE COUNT: 4.3 10*9/L (ref 1.8–7.8)
NEUTROPHILS RELATIVE PERCENT: 45.9 %
PLATELET COUNT: 171 10*9/L (ref 150–450)
RED BLOOD CELL COUNT: 3.72 10*12/L — ABNORMAL LOW (ref 4.26–5.60)
RED CELL DISTRIBUTION WIDTH: 13.6 % (ref 12.2–15.2)
WBC ADJUSTED: 9.4 10*9/L (ref 3.6–11.2)

## 2022-12-24 LAB — URIC ACID: URIC ACID: 5.6 mg/dL

## 2022-12-24 LAB — MAGNESIUM: MAGNESIUM: 2.1 mg/dL (ref 1.6–2.6)

## 2022-12-24 LAB — LACTATE DEHYDROGENASE: LACTATE DEHYDROGENASE: 170 U/L (ref 120–246)

## 2022-12-24 LAB — PHOSPHORUS: PHOSPHORUS: 2.9 mg/dL (ref 2.4–5.1)

## 2022-12-24 MED ADMIN — obinutuzumab 1,000 mg in sodium chloride (NS) 0.9 % 250 mL IVPB: 1000 mg | INTRAVENOUS | @ 16:00:00 | Stop: 2022-12-24

## 2022-12-24 MED ADMIN — diphenhydrAMINE (BENADRYL) capsule/tablet 25 mg: 25 mg | ORAL | @ 15:00:00 | Stop: 2022-12-24

## 2022-12-24 MED ADMIN — sodium chloride 0.9% (NS) bolus 500 mL: 500 mL | INTRAVENOUS | @ 15:00:00 | Stop: 2022-12-24

## 2022-12-24 MED ADMIN — dexAMETHasone (DECADRON) tablet 20 mg: 20 mg | ORAL | @ 15:00:00 | Stop: 2022-12-24

## 2022-12-24 MED ADMIN — acetaminophen (TYLENOL) tablet 650 mg: 650 mg | ORAL | @ 15:00:00 | Stop: 2022-12-24

## 2022-12-24 NOTE — Unmapped (Signed)
Chief concern/reason for visit:  Client here for infusion of obinutuzumab. Client received on time bolus of NS over 4 hours as ordered by Zonia Kief MD. Client completed infusion without complications. Client discharged home in stable condition.     Assessment/Plan:     Client receiving treatment for the following diagnosis:  1. Encounter for antineoplastic chemotherapy    2. CLL (chronic lymphocytic leukemia) (CMS-HCC)      BP 97/52  - Pulse 68  - Temp 36.7 ??C (98.1 ??F) (Oral)  - Resp 18

## 2022-12-24 NOTE — Unmapped (Unsigned)
PIV placed.  Labs drawn & sent for analysis. Patient sent to next appointment. Care provided by Nola Laplanche-Dixon, RN.

## 2022-12-24 NOTE — Unmapped (Signed)
RED ZONE Means: RED ZONE: Take action now!     You need to be seen right away  Symptoms are at a severe level of discomfort    Call 911 or go to your nearest  Hospital for help     - Bleeding that will not stop    - Hard to breathe    - New seizure - Chest pain  - Fall or passing out  -Thoughts of hurting    yourself or others      Call 911 if you are going into the RED ZONE                  YELLOW ZONE Means:     Please call with any new or worsening symptom(s), even if not on this list.  Call [phone]  After hours, weekends, and holidays - you will reach a long recording with specific instructions, If not in an emergency such as above, please listen closely all the way to the end and choose the option that relates to your need.   You can be seen by a provider the same day through our Same Day Acute Care for Patients with Cancer program.      YELLOW ZONE: Take action today     Symptoms are new or worsening  You are not within your goal range for:    - Pain    - Shortness of breath    - Bleeding (nose, urine, stool, wound)    - Feeling sick to your stomach and throwing up    - Mouth sores/pain in your mouth or throat    - Hard stool or very loose stools (increase in       ostomy output)    - No urine for 12 hours    - Feeding tube or other catheter/tube issue    - Redness or pain at previous IV or port/catheter site    - Depressed or anxiety   - Swelling (leg, arm, abdomen,     face, neck)  - Skin rash or skin changes  - Wound issues (redness, drainage,    re-opened)  - Confusion  - Vision changes  - Fever >100.4 F or chills  - Worsening cough with mucus that is    green, yellow, or bloody  - Pain or burning when going to the    bathroom  - Home Infusion Pump Issue- call    [phone]         Call your healthcare provider if you are going into the YELLOW ZONE     GREEN ZONE Means:  Your symptoms are under controls  Continue to take your medicine as ordered  Keep all visits to the provider GREEN ZONE: You are in control  No increase or worsening symptoms  Able to take your medicine  Able to drink and eat    - DO NOT use MyChart messages to report red or yellow symptoms. Allow up to 3    business days for a reply.  -MyChart is for non-urgent medication refills, scheduling requests, or other general questions.         [id number] Rev. 12/20/2021  Approved by Oncology Patient Education Committee         Lab on 12/24/2022   Component Date Value Ref Range Status    Uric Acid 12/24/2022 5.6  3.7 - 9.2 mg/dL Final    LDH 08/65/7846 170  120 - 246 U/L Final    Phosphorus 12/24/2022 2.9  2.4 - 5.1 mg/dL Final    Magnesium 16/03/9603 2.1  1.6 - 2.6 mg/dL Final    Sodium 54/02/8118 143  135 - 145 mmol/L Final    Potassium 12/24/2022 4.4  3.4 - 4.8 mmol/L Final    Chloride 12/24/2022 113 (H)  98 - 107 mmol/L Final    CO2 12/24/2022 26.0  20.0 - 31.0 mmol/L Final    Anion Gap 12/24/2022 4 (L)  5 - 14 mmol/L Final    BUN 12/24/2022 23  9 - 23 mg/dL Final    Creatinine 14/78/2956 1.01  0.73 - 1.18 mg/dL Final    BUN/Creatinine Ratio 12/24/2022 23   Final    eGFR CKD-EPI (2021) Male 12/24/2022 74  >=60 mL/min/1.68m2 Final    eGFR calculated with CKD-EPI 2021 equation in accordance with [employer] and [employer] of Nephrology Task Force recommendations.    Glucose 12/24/2022 115  70 - 179 mg/dL Final    Calcium 21/30/8657 9.7  8.7 - 10.4 mg/dL Final    Albumin 84/69/6295 3.5  3.4 - 5.0 g/dL Final    Total Protein 12/24/2022 5.8  5.7 - 8.2 g/dL Final    Total Bilirubin 12/24/2022 0.6  0.3 - 1.2 mg/dL Final    AST 28/41/3244 28  <=34 U/L Final    ALT 12/24/2022 20  10 - 49 U/L Final    Alkaline Phosphatase 12/24/2022 105  46 - 116 U/L Final    WBC 12/24/2022 9.4  3.6 - 11.2 10*9/L Final    RBC 12/24/2022 3.72 (L)  4.26 - 5.60 10*12/L Final    HGB 12/24/2022 12.4 (L)  12.9 - 16.5 g/dL Final    HCT 06/25/7251 35.9 (L)  39.0 - 48.0 % Final    MCV 12/24/2022 96.6 (H)  77.6 - 95.7 fL Final    MCH 12/24/2022 33.4 (H)  25.9 - 32.4 pg Final    MCHC 12/24/2022 34.6  32.0 - 36.0 g/dL Final    RDW 66/44/0347 13.6  12.2 - 15.2 % Final    MPV 12/24/2022 9.2  6.8 - 10.7 fL Final    Platelet 12/24/2022 171  150 - 450 10*9/L Final    Neutrophils % 12/24/2022 45.9  % Final    Lymphocytes % 12/24/2022 39.8  % Final    Monocytes % 12/24/2022 8.3  % Final    Eosinophils % 12/24/2022 5.6  % Final    Basophils % 12/24/2022 0.4  % Final    Absolute Neutrophils 12/24/2022 4.3  1.8 - 7.8 10*9/L Final    Absolute Lymphocytes 12/24/2022 3.7 (H)  1.1 - 3.6 10*9/L Final    Absolute Monocytes 12/24/2022 0.8  0.3 - 0.8 10*9/L Final    Absolute Eosinophils 12/24/2022 0.5  0.0 - 0.5 10*9/L Final    Absolute Basophils 12/24/2022 0.0  0.0 - 0.1 10*9/L Final

## 2022-12-24 NOTE — Unmapped (Signed)
0849 BP 80/40, HR 71, pt. Denies feeling faint, dizzy with positional changes stated his BP is low at night due to medications he takes. Does complain of feeling unsteady at times due to past stroke and fell last Monday taking recyclables out to the curb and was pushing the can on wheels when he hit a rock and the can tipped and he fell.

## 2022-12-24 NOTE — Unmapped (Addendum)
Patient Instructions:   It was good to see you!    Please hold the acalabrutinib 3 days before the colonoscopy. If they do a biopsy do the procedure, please hold the acalabrutinib for 3 days after the colonoscopy.      Medical Team    Physician: Lawson Radar, DO  Nurse Practitioner: Cruzita Lederer, FNP-BC  Pharmacists: Manfred Arch, PharmD, BCOP, CPP; Ronnald Collum, PharmD, BCOP  Nurse Navigator: Joaquin Bend, MSN, RN, [facility]    Regarding labs and other test results, please know that due to federal laws, all test results are now released and available for review on MyChart immediately upon becoming available.  This means that unlike before, you will now receive results before we can review them and attach comments and explanations. We will still attach those comments or call you as soon as we have all your results (for our standard blood tests, that usually takes 48-72 hours after they're drawn).  For some patients, seeing test results without our comments can cause anxiety about those results and even misunderstanding if a patient interprets them incorrectly.  We regret any such anxiety you may experience if you choose to review labs before we have commented. Please note, however, that to prevent anxiety, we recommend that you consider waiting to review your results until you receive an email that says we have attached our comments.  That makes sure that you receive our interpretation with your labs, which can help to avoid misunderstood results and perhaps undue anxiety.  Either way, please know we monitor your test results closely.    Labs:   Lab on 12/24/2022   Component Date Value Ref Range Status    Uric Acid 12/24/2022 5.6  3.7 - 9.2 mg/dL Final    LDH 96/29/5284 170  120 - 246 U/L Final    Phosphorus 12/24/2022 2.9  2.4 - 5.1 mg/dL Final    Magnesium 13/24/4010 2.1  1.6 - 2.6 mg/dL Final    Sodium 27/25/3664 143  135 - 145 mmol/L Final    Potassium 12/24/2022 4.4  3.4 - 4.8 mmol/L Final    Chloride 12/24/2022 113 (H)  98 - 107 mmol/L Final    CO2 12/24/2022 26.0  20.0 - 31.0 mmol/L Final    Anion Gap 12/24/2022 4 (L)  5 - 14 mmol/L Final    BUN 12/24/2022 23  9 - 23 mg/dL Final    Creatinine 40/34/7425 1.01  0.73 - 1.18 mg/dL Final    BUN/Creatinine Ratio 12/24/2022 23   Final    eGFR CKD-EPI (2021) Male 12/24/2022 74  >=60 mL/min/1.11m2 Final    eGFR calculated with CKD-EPI 2021 equation in accordance with [employer] and [employer] of Nephrology Task Force recommendations.    Glucose 12/24/2022 115  70 - 179 mg/dL Final    Calcium 95/63/8756 9.7  8.7 - 10.4 mg/dL Final    Albumin 43/32/9518 3.5  3.4 - 5.0 g/dL Final    Total Protein 12/24/2022 5.8  5.7 - 8.2 g/dL Final    Total Bilirubin 12/24/2022 0.6  0.3 - 1.2 mg/dL Final    AST 84/16/6063 28  <=34 U/L Final    ALT 12/24/2022 20  10 - 49 U/L Final    Alkaline Phosphatase 12/24/2022 105  46 - 116 U/L Final    WBC 12/24/2022 9.4  3.6 - 11.2 10*9/L Final    RBC 12/24/2022 3.72 (L)  4.26 - 5.60 10*12/L Final    HGB 12/24/2022 12.4 (L)  12.9 -  16.5 g/dL Final    HCT 16/03/9603 35.9 (L)  39.0 - 48.0 % Final    MCV 12/24/2022 96.6 (H)  77.6 - 95.7 fL Final    MCH 12/24/2022 33.4 (H)  25.9 - 32.4 pg Final    MCHC 12/24/2022 34.6  32.0 - 36.0 g/dL Final    RDW 54/02/8118 13.6  12.2 - 15.2 % Final    MPV 12/24/2022 9.2  6.8 - 10.7 fL Final    Platelet 12/24/2022 171  150 - 450 10*9/L Final    Neutrophils % 12/24/2022 45.9  % Final    Lymphocytes % 12/24/2022 39.8  % Final    Monocytes % 12/24/2022 8.3  % Final    Eosinophils % 12/24/2022 5.6  % Final    Basophils % 12/24/2022 0.4  % Final    Absolute Neutrophils 12/24/2022 4.3  1.8 - 7.8 10*9/L Final    Absolute Lymphocytes 12/24/2022 3.7 (H)  1.1 - 3.6 10*9/L Final    Absolute Monocytes 12/24/2022 0.8  0.3 - 0.8 10*9/L Final    Absolute Eosinophils 12/24/2022 0.5  0.0 - 0.5 10*9/L Final    Absolute Basophils 12/24/2022 0.0  0.0 - 0.1 10*9/L Final       CONTACT INFORMATION     PHONE:     From Monday through Friday, 8am - 5pm, for all questions including appointments and clinical issues please call [phone] or toll-free [phone].  Please ask for nurse triage.     On Nights, Weekends and Holidays, for emergencies call [phone].    Please fax Insurance, Disability, or FMLA paperwork to [phone]. Please allow 1 week for completion of paperwork.      Leanora Ivanoff (messages you want to send through the electronic health record):     1) Send messages to Dr. Zonia Kief as needed. Messages are reviewed by our nursing staff and handled appropriately.     2) *do not use this system to send urgent messages* (anything needing a response in less than 48 hours).  If you have an urgent need, please call the number above.     Questions About Your Visit?     If you have any questions about any information included in this visit summary, please contact your provider by sending a secure message using My [facility] Chart or by phone at the number listed above. Bring this form with you to your next scheduled appointment as a reminder to discuss with your provider. Use this form to make notes about any medications, including over-the-counter medications, you have stopped or started taking, or any questions you have about any test or procedure results.    Medication Refills: please do not send urgent refill requests through MyChart.  Please call the numbers above.

## 2022-12-29 MED ORDER — FERROUS SULFATE 325 MG (65 MG IRON) TABLET
ORAL_TABLET | ORAL | 11 refills | 60 days
Start: 2022-12-29 — End: 2023-12-29

## 2022-12-30 MED ORDER — FERROUS SULFATE 325 MG (65 MG IRON) TABLET
ORAL_TABLET | ORAL | 11 refills | 60 days | Status: CP
Start: 2022-12-30 — End: 2023-12-30
  Filled 2023-01-13: qty 30, 60d supply, fill #0

## 2023-01-05 DIAGNOSIS — Z5111 Encounter for antineoplastic chemotherapy: Principal | ICD-10-CM

## 2023-01-05 DIAGNOSIS — C9110 Chronic lymphocytic leukemia of B-cell type not having achieved remission: Principal | ICD-10-CM

## 2023-01-12 ENCOUNTER — Other Ambulatory Visit: Payer: Self-pay | Admitting: Neurology

## 2023-01-12 DIAGNOSIS — R29810 Facial weakness: Secondary | ICD-10-CM

## 2023-01-12 NOTE — Unmapped (Signed)
[facility] Refill Coordination Note    Specialty Medication(s) to be Shipped:   Hematology/Oncology: Calquence    Other medication(s) to be shipped:  Valacyclovir 500 mg, Ferrous Sulfate     Maren Beach, DOB: 1941/03/03  Phone: [phone] (home) [phone] (work)      All above HIPAA information was verified with patient.     Was a Nurse, learning disability used for this call? No    Completed refill call assessment today to schedule patient's medication shipment from the [facility] [phone]).  All relevant notes have been reviewed.     Specialty medication(s) and dose(s) confirmed: Regimen is correct and unchanged.   Changes to medications: Camdyn reports no changes at this time.  Changes to insurance: No  New side effects reported not previously addressed with a pharmacist or physician: None reported  Questions for the pharmacist: No    Confirmed patient received a Conservation officer, historic buildings and a Surveyor, mining with first shipment. The patient will receive a drug information handout for each medication shipped and additional FDA Medication Guides as required.       DISEASE/MEDICATION-SPECIFIC INFORMATION        N/A    SPECIALTY MEDICATION ADHERENCE     Medication Adherence    Patient reported X missed doses in the last month: 0  Specialty Medication: Calquence 100mg   Patient is on additional specialty medications: No  Informant: patient              Were doses missed due to medication being on hold? No    Calquence 100 mg: 0 days of medicine on hand        REFERRAL TO PHARMACIST     Referral to the pharmacist: Not needed      [facility]     Shipping address confirmed in Epic.       Delivery Scheduled: Yes, Expected medication delivery date: 01/13/23.     Medication will be delivered via Same Day Courier to the prescription address in Epic WAM.    Willette Pa   [facility] Specialty Technician

## 2023-01-13 MED FILL — VALACYCLOVIR 500 MG TABLET: ORAL | 30 days supply | Qty: 30 | Fill #2

## 2023-01-13 MED FILL — CALQUENCE (ACALABRUTINIB MALEATE) 100 MG TABLET: ORAL | 30 days supply | Qty: 60 | Fill #4

## 2023-01-20 IMAGING — CT CT CHEST-ABD-PELV W/ CM
3 of 5 series · 13 of 36 positions shown, 14 images · IV contrast (omnipaque)
Comparison: CT the chest, abdomen and pelvis 10/27/2019.

CLINICAL DATA: 80-year-old male with history of leukocytosis.
Lymphadenopathy. History of prostate cancer.

EXAM:
CT CHEST, ABDOMEN, AND PELVIS WITH CONTRAST
TECHNIQUE: Multidetector CT imaging of the chest, abdomen and pelvis was
performed following the standard protocol during bolus
administration of intravenous contrast.
CONTRAST:  100mL OMNIPAQUE IOHEXOL 300 MG/ML  SOLN

[Series 2: cap with · axial · 0.78mm/px · z∈[-870,-380]mm · 8 of 126 slices shown, 9 images]
[im 14/126  mediastinal]
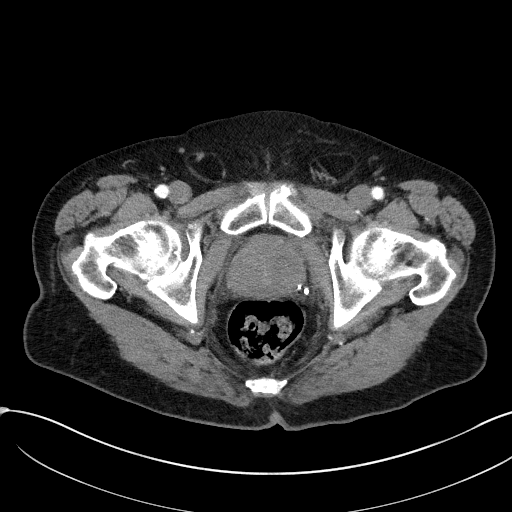
[im 14/126  bone]
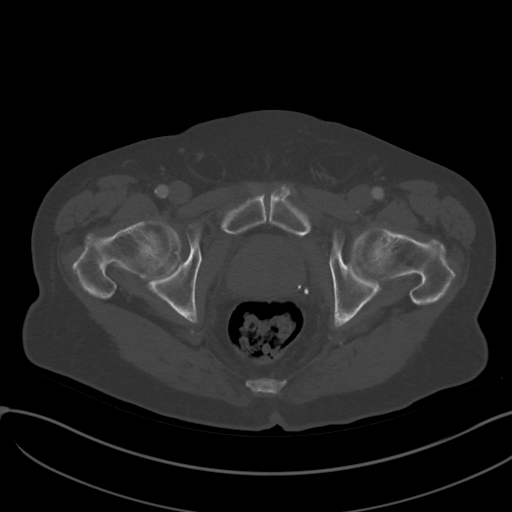
[im 28/126  mediastinal]
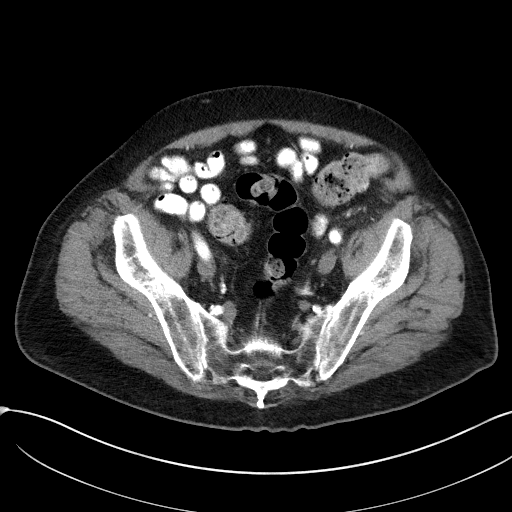
[im 42/126  mediastinal]
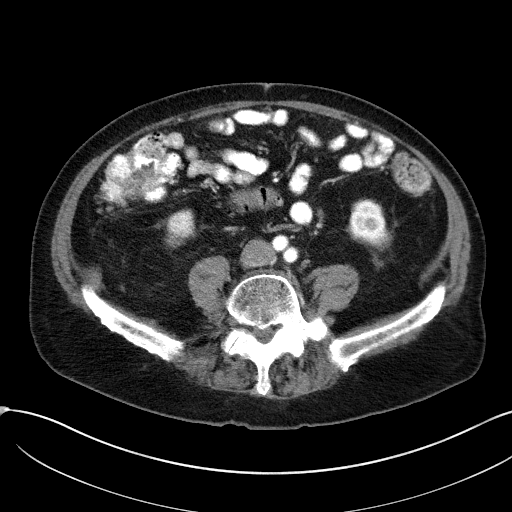
[im 56/126  mediastinal]
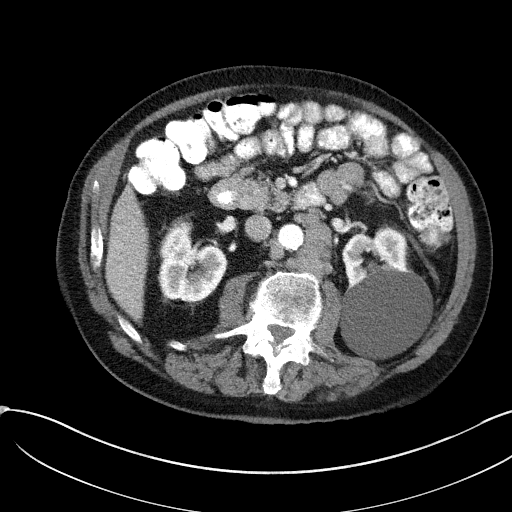
[im 70/126  mediastinal]
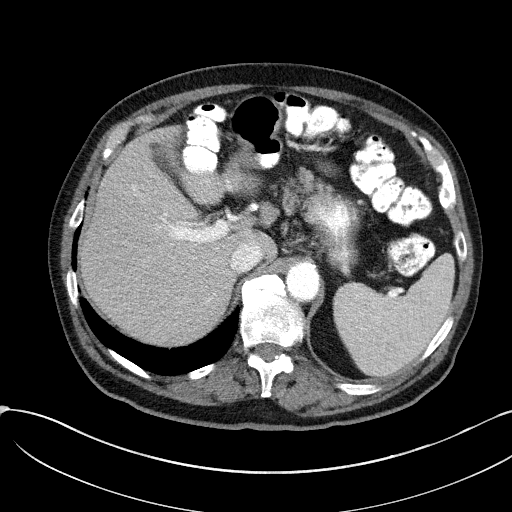
[im 84/126  mediastinal]
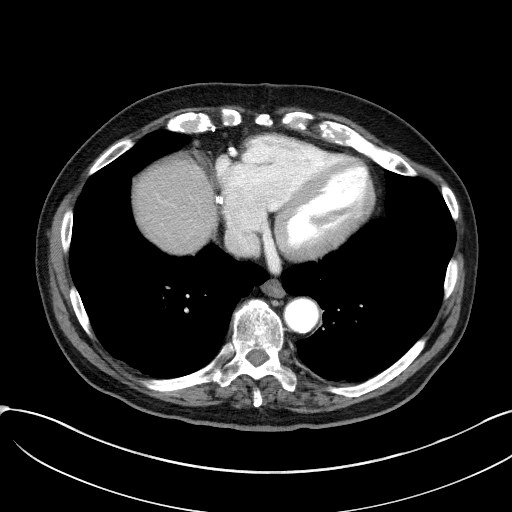
[im 98/126  mediastinal]
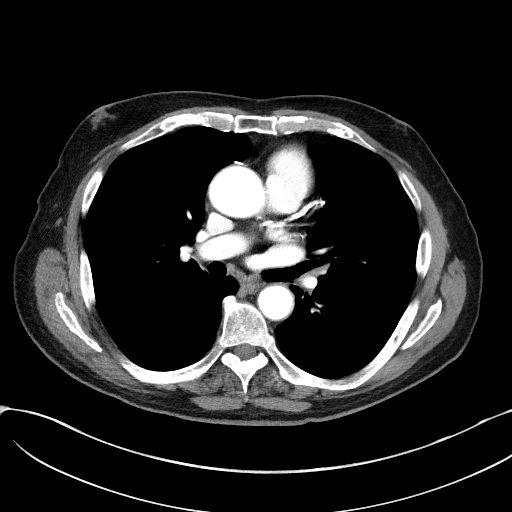
[im 112/126  mediastinal]
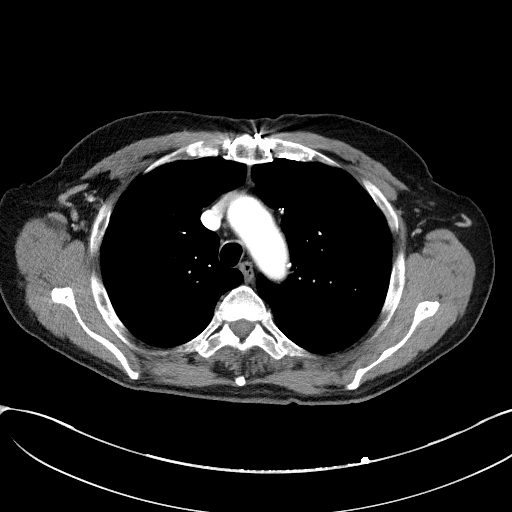

[Series 4: lung · axial · 0.78mm/px · z∈[-636,-586]mm · 2 of 176 slices shown]
[im 13/176  bone]
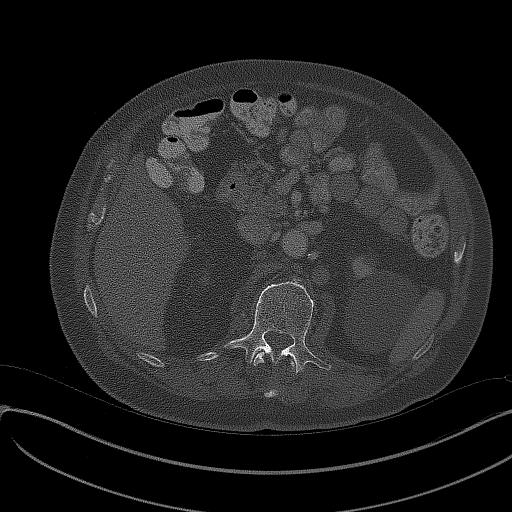
[im 38/176  bone]
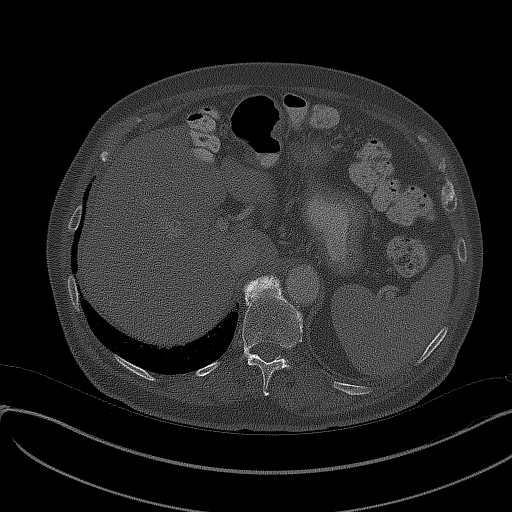

[Series 5: coronals · coronal · 0.77mm/px · 3 of 148 slices shown]
[im 30/148  mediastinal]
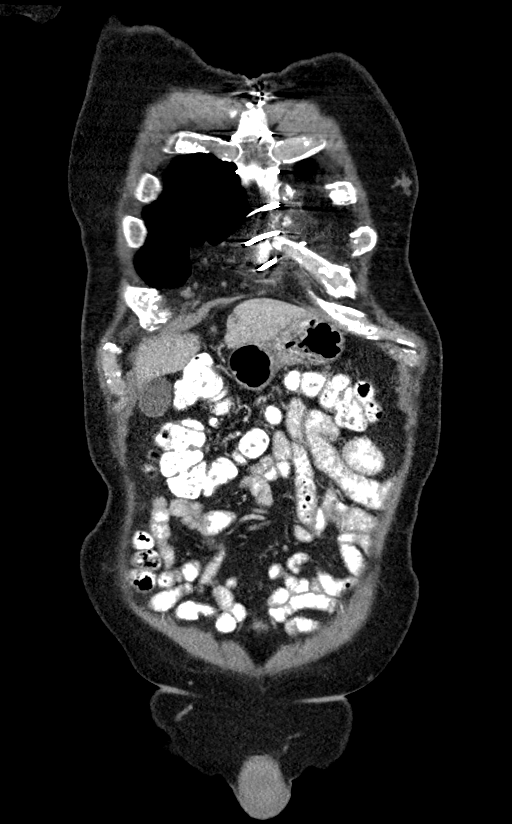
[im 59/148  mediastinal]
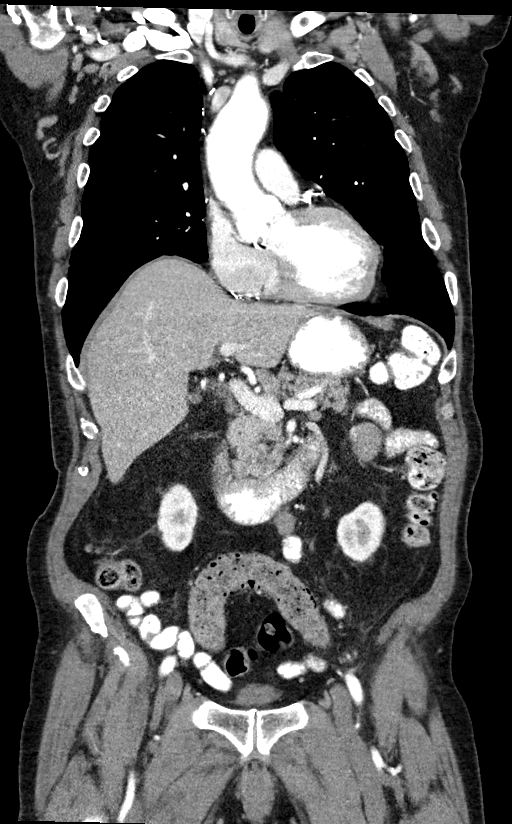
[im 89/148  mediastinal]
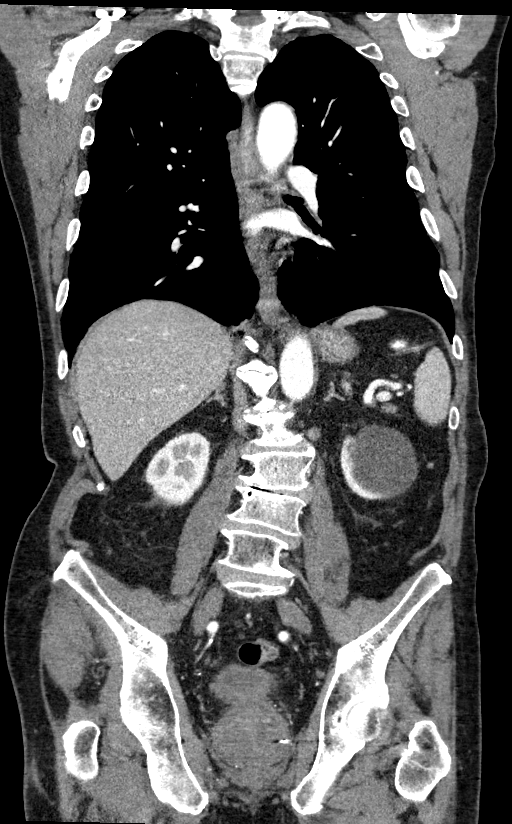

[13 of 36 positions shown; findings below may reference images not displayed]

FINDINGS: CT CHEST FINDINGS

Cardiovascular: Heart size is normal. There is no significant
pericardial fluid, thickening or pericardial calcification. There is
aortic atherosclerosis, as well as atherosclerosis of the great
vessels of the mediastinum and the coronary arteries, including
calcified atherosclerotic plaque in the left main, left anterior
descending, left circumflex and right coronary arteries. Status post
median sternotomy for CABG including [REDACTED] to the LAD. Thickening
calcification of the aortic valve.

Mediastinum/Nodes: Bulky lymphadenopathy in the superior
mediastinum, largest of which is adjacent to the proximal left
common carotid and left subclavian arteries measuring 2.2 cm in
short axis (axial image 6 of series 2). No additional mediastinal or
hilar lymphadenopathy noted. Esophagus is unremarkable in
appearance. Deep axillary and subpectoral lymphadenopathy is noted
bilaterally, measuring up to 1.5 cm in short axis on the left (axial
image 7 of series 2) and 1.4 cm in short axis on the right (axial
image 11 of series 2).

Lungs/Pleura: 3 mm right middle lobe pulmonary nodule (axial image
91 of series 4), stable compared to the prior study, considered
benign. No other larger more suspicious appearing pulmonary nodules
or masses are noted. Scattered areas of mild linear scarring in the
lung bases bilaterally. No acute consolidative airspace disease. No
pleural effusions.

Musculoskeletal: There are no aggressive appearing lytic or blastic
lesions noted in the visualized portions of the skeleton. Median
sternotomy wires.

CT ABDOMEN PELVIS FINDINGS

Hepatobiliary: 2.5 x 1.9 cm low-attenuation lesion in segment 6 of
the liver (axial image 61 of series 2), unchanged. Other
subcentimeter low-attenuation lesions in the left lobe of the liver
also unchanged but too small to characterize, statistically likely
to represent tiny cysts. No new suspicious appearing hepatic lesions
are noted. No intra or extrahepatic biliary ductal dilatation.
Gallbladder is normal in appearance.

Pancreas: No pancreatic mass. No pancreatic ductal dilatation. No
pancreatic or peripancreatic fluid collections or inflammatory
changes.

Spleen: Unremarkable.

Adrenals/Urinary Tract: Low-attenuation lesions in both kidneys,
compatible with simple cysts, largest of which is exophytic
extending from the posterior aspect of the upper pole of the left
kidney measuring 7.8 x 6.3 cm. No suspicious renal lesions.
Bilateral adrenal glands are normal in appearance. No
hydroureteronephrosis. Urinary bladder is nearly decompressed, but
otherwise unremarkable in appearance.

Stomach/Bowel: The appearance of the stomach is normal. No
pathologic dilatation of small bowel or colon. Numerous colonic
diverticulae are noted, particularly in the sigmoid colon, without
surrounding inflammatory changes to suggest an acute diverticulitis
at this time. Normal appendix.

Vascular/Lymphatic: Aortic atherosclerosis, without evidence of
aneurysm or dissection in the abdominal or pelvic vasculature.
Extensive retroperitoneal lymphadenopathy is again noted, progressed
compared to the prior examination, with the bulkiest conglomerate
nodal mass in the left para-aortic nodal station (axial image 72 of
series 2), currently measuring 5.5 x 3.4 cm. Numerous enlarged
mesenteric lymph nodes are also noted, largest of which is in the
left side of the abdomen (axial image 73 of series 2) measuring
cm in short axis. No definite pelvic lymphadenopathy.

Reproductive: Fiducial markers are noted in and adjacent to the
prostate gland which appears mildly enlarged, with some median lobe
hypertrophy. Seminal vesicles are unremarkable in appearance.

Other: No significant volume of ascites.  No pneumoperitoneum.

Musculoskeletal: There are no aggressive appearing lytic or blastic
lesions noted in the visualized portions of the skeleton.
IMPRESSION: 1. Extensive progressive lymphadenopathy in the retroperitoneum and
small bowel mesentery, now also noted in the superior mediastinum
and bilateral axillary/subpectoral regions. Although findings could
be indicative of metastatic disease from primary such as prostate
cancer, given the lack of pelvic lymphadenopathy and the unusual
distribution of lymphadenopathy in this case, the possibility of a
lymphoproliferative disorder such as leukemia or lymphoma should
also be considered.
2. Colonic diverticulosis without evidence of acute diverticulitis
at this time.
3. There are calcifications of the aortic valve. Echocardiographic
correlation for evaluation of potential valvular dysfunction may be
warranted if clinically indicated.
4. Aortic atherosclerosis, in addition to left main and three-vessel
coronary artery disease. Status post median sternotomy for CABG
including [REDACTED] to the LAD.

## 2023-01-20 NOTE — Unmapped (Signed)
[facility]  Primary Hematologist: Lawson Radar, DO   Specialty: CLL  Visit Type: Follow up - On treatment     Patient: Brendan Bishop  MRN: [id number]  DOB: Jan 30, 1941  DOS: 01/21/2023    Referring Physician: Silvio Pate*    Chronic Lymphocytic Leukemia  Date of Diagnosis 05/21/2021   Rai Stage at Dx 1   Beta-2 microglobulin at Dx Not done   IGHV Mutational Status Un-mutated   FISH Normal   Cytogenetics 46,XY,del(7)(q31q32)[14]/46,XY[6]    Mutations TP53 mutation c.375G>A    CLL-IPI Score at Dx Unable to calculate, but at least 4 for age/stage/IGHV     Assessment/Plan:   82 y.o. male with a history of multiple cancers, who presents for follow-up of biphenotypic LPD.    #Biphenotypic B-cell lymphoproliferative disorder  -He has 2 distinct abnormal monoclonal B-cell populations.  One is classic for CLL with CD5+ and dim CD20 and kappa restricted.  The other is more consistent with marginal zone lymphoma which is CD5-/CD10-/CD20+ and lambda restricted.  As all pathology has been equal distribution of both cell types, it is unclear which or if both are contributing equally to the progressive lymphocytosis.  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  -Due to rapidly progressive lymphocytosis and progressive lymphadenopathy and anemia, he meets criteria for treatment.  -Interestingly, his peripheral blood flow from 07/23/22 showed 60% of the CD5- population and 10% of the CD5+ population, while the bone marrow biopsy from 08/06/22 demonstrated 90% of the CD5+ (although both populations were detected by IGH sequencing)  -Previously discussed potential treatment options, but the best option that would be effective against both clones is the combination of obinutuzumab and acalabrutinib as given for frontline CLL therapy in the E[facility]ATE [place] study. Obinutuzumab would be given for 6 months and acalabrutinib would be given continuously.  -Potential side effects include but are not limited to infusion reaction, infection, neutropenia, diarrhea, nausea, heartburn, joint or muscle aches, bleeding, bruising, atrial fibrillation, hypertension, and headache.  -He initiated therapy with obinutuzumab and acalabrutinib 09/03/22.   -His labs are appropriate to proceed with treatment today.  - We will plan for CT c/a/p with contrast and visit in 4 weeks to evaluate treatment response given biphenotypic lymphoproliferative disorders    #Tick Bite Pt with multiple tick bites in early June, one of which was buried in skin for 3-4 days. Tick was removed in entirety and since removal patient with increased fatigue. No fevers, chills, HA, photosensitivity, neck pain.  - 11/26/22 Lyme, Ehrlichia, RMSF PCR negative  - Empirically treated with doxycycline x 7 days- patient did not complete the course    #LLE pain, resolved: Noted at last visit: 2 day history pain to the L posterior knee after sitting. Pain remains when on stairs and getting and out of truck  - PVL to r/o DVT, negative    Possible CVA?: Patient had visit with neurology last week and c/f new CVA with subtle new L facial droop. He has a MRI scheduled for later this week. Denies other deficits/weakness. Denies HA.  - Continue following with neurology  - Low concern for large CVA, whether it is ischemic or hemorrhagic, given no other deficits                                                  #  Anemia  -Likely related to B-cell malignancy as above.  -Also iron deficient.  Recommended increase iron supplementation to ferrous sulfate 325mg  every other day.  Recheck 09/03/22 with continued low Tsat at 14% and Ferritin at 40.  Hgb down to 10.  He received IV iron on 10/03/22.  There is mild improvement in Fe panel. We will continue with PO supplementation.   -He had a colonoscopy in 2022 and said that he had polyps and would have to return in 2027 for next one.  He had consultation visit on 6/13 with Dr. Adela Lank at [facility] in [place] for EGD/colonoscopy, scheduled for 8/20. Request records when available.  -Also of note, he previously had a bladder tumor that was causing bleeding.  He underwent repeat cystoscopy on 10/22/22 with Dr. Legrand Rams.  No evidence of recurrent disease.  He has requested repeat cystoscopy in 9 months, which should be ~2/25.  -We have reviewed with him that acalabrutinib can worsen bleeding and if he notices any new bleeding, he knows to contact us immediately.    #Dysphagia to solids   -Previously needed an esophageal dilation.  -EGD scheduled 8/20 now that established with local GI    #Rash, resolved  Eruption involving left > right side of back and right leg. No clear vesicles were noted, and the rash crosses the midline. Will prescribe topical steroid for possible drug-related eruption. Advised patient to alert [place] to worsening or evolution of rash. Mild ecchymotic appearance to R anterior shin, no distinct raised features.    #Falls:  - Fall on deck last week, did not hit head/LOC. No bone/joint pain.  - Falls typically 1 times per month. Notes poor balance s/p CVA in 2011. Did to PT for a short time 3 years ago but does not feel it was helpful nor does not have the time to continue.   - He declined PT and Dr Sudie Bailey at this time given time commitment required    #Skin excoriation left temple.  -Recommended dermatology evaluation as this is suspicious for SCC.  He has a Ship broker who he will contact. He has an appt on 02/21/23   [facility] D[facility]atology internal referral placed however patient is not able to make it to these appts on Tuesdays therefore will keep appt with local Derm.    #Multiple medical comorbidities.  -Managed by PCP and other specialists.      #. Preventive  Cancer screening:  Patients with CLL/SLL are at increased risk of developing secondary malignancies.  Patient is not currently due for screening.  Annual dermatology evaluation, due to increased risk of non-melanomatous skin cancers.   If needed, blood products for transfusion should be irradiated.  Risk of Hypogammaglobulinemia: Baseline IgG levels 03/11/19 = 698, 07/23/22 1095  Vaccination  Avoid all live vaccines.  Recommend annual influenza vaccine.  Last given: 05/28/22  Recommend COVID vaccine series and booster.  Last given:  04/13/20  Recommend pneumococcal vaccine with Prevnar 20.  Will need to receive this 6 months after completion of obinutuzumab.  Recommend RSV vaccine. Last given:  07/18/22  Recommend Zoster vaccine recombinant, adjuvanted (Shingrix).  Last given: 01/18/13 (chart lists as zostavax, will need to confirm)     -RTC in 4 weeks with imaging, labs, visit.      In total I spent 50 minutes in this visit which consisted of collecting a history and making medical management decisions for the diagnoses listed in my note, as well as reviewing and updating the patient's  medical record, coordinating care, and documenting before, during and after the visit.    Cruzita Lederer, MSN, FNP-BC, [facility]  Nurse Practitioner  [facility] Hematology/Oncology                 Chief Complaint:     No chief complaint on file.      Oncology/Treatment History:     Hematology/Oncology History   CLL (chronic lymphocytic leukemia) (CMS-HCC)   05/21/2021 Initial Diagnosis    CLL (chronic lymphocytic leukemia) (CMS-HCC)  -While he was officially diagnosed in 2022, he had a mild lymphocytosis dating back to 2017, when he was undergoing radiation for his prostate cancer.  On 02/08/2016, he had a flow cytometry, which showed a small population of monoclonal B-cells that were CD5-/CD10- with a total of 2.9k/uL clonal cells.  Observation was recommended.   -further evaluation of asymptomatic lymphocytosis ensued 05/21/2021.  -WBC 20.1, Hgb 12.5, Plt 238, ALC 13.8, ANC 3.96, B2M Not done, LDH 116 (ULN 192)  -Flow showed monoclonal B-cell population consistent with CLL with absolute clonal count of 3.74 k/UL, however a separate population of CD5-/CD10- monoclonal B-cells was detected  -Rai Stage: 1  -IGHV: Unmutated  -FISH showed: Normal  -Karyotype showed: Not completed.  -Recommendation: observation       07/01/2021 Interval Scan(s)    PET scan with scattered LAD, max size 2.0cm, No hepatosplenomegaly.     07/05/2021 Biopsy    Bone marrow biopsy showed hypercellular bone marrow with involvement of a low-grade B-cell lymphoproliferative disorder.  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 Ki-67 stain is variable in the interfollicular areas, 10% to 30%.   There are two separate B cell clones, in roughly equal proportions:   1) Kappa light chain restricted (dim expression), CD20+ (dim), CD5+, CD23+, CD43+, FMC-   2) Lambda light chain restricted, CD20+, CD5-, CD23+, CD43-, FMC+(dim).      06/26/2022 Interval Scan(s)    PET scan with continued LAD with mild progression.  Largest node 2.4cm in mesentery.     07/23/2022 Molecular Markers    -FISH showed: Normal  -Karyotype showed: 46,XY,del(7)(q31q32)[14]/46,XY[6]   -TP53 mutation was detected c.375G>A      08/05/2022 Biopsy    Bone marrow biopsy:  -  Hypercellular bone marrow (90%) primarily involved by CD5+ B-cell lymphoma/leukemia, consistent with chronic lymphocytic leukemia, representing approximately 70% of marrow cellularity by immunohistochemistry (see Comment)  -  Clonal immunoglobulin heavy (IGH) and kappa light chain (IGK) gene rearrangements identified (see Comment)            History Obtained From:   Patient and his significant other.    History of Present Illness:   ULMER DEGEN is a 82 y.o. male with a history of multiple cancers, who presents with progressive lymphocytosis due to his lymphoproliferative disorder and consultation for treatment recommendations.  For detailed oncologic and treatment (if applicable) history, please see above.     Interim History   Since his last visit, Mr. Olheiser he is doing well but has noticed generalized more tiredness but still extremely active and works outdoors quite a bit. Denies f/c/ns. Denies abd pain. Denies n/v/d.    L leg pain resolved    He notes his neurologist thinks he had a new CVA given new subtle L facial droop. No o there deficits. No HAs. Has poor balance but this is not new. No  falls since last visit.  He does have some bruising to his arms.    He denies new constitutional symptoms such as anorexia, weight loss, night sweats or unexplained fevers.  Furthermore, he denies symptoms of marrow failure: unexplained bleeding, recurrent or unexplained intercurrent infections, dyspnea on exertion, lightheadedness, palpitations or chest pain.  There have been no new or unexplained pains or self-identified masses, swelling or enlarged lymph nodes.     Review of Systems   Complete ROS performed and was negative except what is noted in interim history above.    Past Medical History     Past Medical History:   Diagnosis Date    Bladder cancer (CMS-HCC) 09/13/2021    CAD (coronary artery disease)     CLL (chronic lymphocytic leukemia) (CMS-HCC)     GERD (gastroesophageal reflux disease)     Hyperlipidemia     Hypertension     Marginal zone B-cell lymphoma (CMS-HCC)     Multinodular goiter     OSA (obstructive sleep apnea)     Prostate cancer (CMS-HCC) 10/2015    Stroke (CMS-HCC) 2011       Past Surgical History:     Past Surgical History:   Procedure Laterality Date    BIOPSY OF THYROID [facility] HISTORICAL RESULT)      BONE MARROW BIOPSY      CARPAL TUNNEL RELEASE      CATARACT EXTRACTION Bilateral     CORONARY ARTERY BYPASS GRAFT  2005    HEMORRHOID SURGERY  2002    LYMPH NODE BIOPSY  07/08/2021    PAROTIDECTOMY Left 08/04/2017    TONSILLECTOMY AND ADENOIDECTOMY      TOTAL KNEE ARTHROPLASTY Left 2014    TOTAL KNEE ARTHROPLASTY Right 05/17/2018    TRANSURETHRAL RESECTION OF BLADDER TUMOR  09/13/2021       Allergies:     No Known Allergies      Family History:     Family History   Problem Relation Age of Onset    Heart disease Mother     Parkinsonism Father     Alzheimer's disease Father     Diabetes Paternal Grandfather        Social History:     Social History     Tobacco Use    Smoking status: Never    Smokeless tobacco: Never   Vaping Use    Vaping status: Never Used   Substance Use Topics    Alcohol use: Not Currently    Drug use: Never       Medications:     Current Outpatient Medications   Medication Sig Dispense Refill    acalabrutinib (CALQUENCE) 100 mg tablet Take 1 tablet (100 mg total) by mouth two (2) times a day. Swallow whole with water. Do not to chew, crush, dissolve, or cut tablets. 60 tablet 11    acetaminophen (TYLENOL) 325 MG tablet Take 1.5 tablets (487.5 mg total) by mouth nightly. 100 tablet 2    allopurinol (ZYLOPRIM) 300 MG tablet Take 1 tablet (300 mg total) by mouth daily. 30 tablet 1    ascorbic acid, vitamin C, (VITAMIN C) 1000 MG tablet Take 1 tablet (1,000 mg total) by mouth.      aspirin (ECOTRIN) 81 MG tablet Take 1 tablet (81 mg total) by mouth.      atorvastatin (LIPITOR) 80 MG tablet       b complex vitamins (SUPER B-50 COMPLEX) capsule Take 1 capsule by mouth daily. 30 capsule 11  bimatoprost (LUMIGAN) 0.01 % Drop Administer 1 drop to both eyes nightly. 30 mL 0    cholecalciferol, vitamin D3-250 mcg, 10,000 unit,, 250 mcg (10,000 unit) capsule Take 0.5 capsules (125 mcg total) by mouth daily. 120 capsule 2    coenzyme Q10 200 mg capsule Take 1 capsule (200 mg total) by mouth.      cyanocobalamin, vitamin B-12, 1000 MCG tablet Take 0.5 tablets (500 mcg total) by mouth daily. 30 tablet 0    ENTRESTO 24-26 mg tablet Take 1 tablet by mouth.      ferrous sulfate 325 (65 FE) MG tablet Take 1 tablet (325 mg total) by mouth every other day. 30 tablet 11    fish,bora,flax oils-om3,6,9no1 (OMEGA 3-6-9) 1,200 mg cap Take 1 tablet by mouth in the morning. 30 capsule 0    L.acidophil-L.plantar-Bifido 7 15 billion cell cap Take 5 mg by mouth in the morning. 30 capsule 0    lysine 1,000 mg Tab Take 1,000 mg by mouth.      magnesium gluconate (MAGONATE) 500 mg (27 mg elem magnesium) tablet Take 1 tablet by mouth daily. 30 tablet 0    midodrine (PROAMATINE) 5 MG tablet Take 1 tablet (5 mg total) by mouth Three (3) times a day.      mv-min-FA-vit K-lutein-zeaxant (PRESERVISION AREDS 2 PLUS MV) 200 mcg-15 mcg- 5 mg-1 mg cap Take 1 tablet by mouth in the morning. 30 capsule 0    NON FORMULARY Astralagus, Black elderberry, reishii      ondansetron (ZOFRAN) 4 MG tablet Take 1 tablet (4 mg) by mouth once a day as needed for nausea. 30 tablet 0    selenium 200 mcg Tab Take 1 tablet (200 mcg total) by mouth.      spironolactone (ALDACTONE) 25 MG tablet Take 1 tablet (25 mg total) by mouth daily.      tamsulosin (FLOMAX) 0.4 mg capsule       traMADol (ULTRAM) 50 mg tablet TAKE 1 TO 2 TABLETS BY MOUTH THREE TIMES DAILY AS NEEDED FOR PAIN      triamcinolone (KENALOG) 0.1 % ointment Apply topically two (2) times a day. 30 g 0    valACYclovir (VALTREX) 500 MG tablet Take 1 tablet (500 mg total) by mouth daily. 30 tablet 5    zinc gluconate 50 mg (7 mg elemental zinc) tablet Take 1 tablet (50 mg total) by mouth daily.       No current facility-administered medications for this visit.       Physical Exam:     PHYSICAL EXAMINATION:  ECOG: 2   VITAL SIGNS:  There were no vitals taken for this visit.    General: well appearing, in no acute distress  HEENT: Sclera anicteric. Conjunctivae not injected, no neck pain or rigidity noted.  Resp: Normal WOB, symmetric excursion, CTAB  CV: RRR without murmurs, rubs or gallops.    GI: Normal bowel sounds, abdomen soft, non-tender, non-distended  Skin: Scattered ant bites with associated erythema to the ant torso  Neuro: Alert and oriented to conversation, EOM intact, no facial droop, speech fluent and coherent, moves all extremities without asymmetry and with at least antigravity strength  Psych: Normal affect  Extremities: No clubbing or lower extremity edema appreciated.  Mild compression stocking imprints apparent to bilateral to shin.  LYMPH: No palpable cervical, supraclavicular, axillary, inguinal lymphadenopathy. Palpable cervical glands, not distinct LN.      Labs:   See oncology history      Pathology:  See oncology history    Imaging:   I personally reviewed all of the patient's pertinent images and documented any relevant findings in the assessment/plan.  See outside records.    No new pertinent imaging today.

## 2023-01-21 ENCOUNTER — Ambulatory Visit: Admit: 2023-01-21 | Discharge: 2023-01-22 | Payer: MEDICARE

## 2023-01-21 ENCOUNTER — Other Ambulatory Visit: Admit: 2023-01-21 | Discharge: 2023-01-22 | Payer: MEDICARE

## 2023-01-21 DIAGNOSIS — C9110 Chronic lymphocytic leukemia of B-cell type not having achieved remission: Principal | ICD-10-CM

## 2023-01-21 DIAGNOSIS — Z5111 Encounter for antineoplastic chemotherapy: Principal | ICD-10-CM

## 2023-01-21 LAB — CBC W/ AUTO DIFF
BASOPHILS ABSOLUTE COUNT: 0 10*9/L (ref 0.0–0.1)
BASOPHILS RELATIVE PERCENT: 0.6 %
EOSINOPHILS ABSOLUTE COUNT: 0.2 10*9/L (ref 0.0–0.5)
EOSINOPHILS RELATIVE PERCENT: 3 %
HEMATOCRIT: 34.5 % — ABNORMAL LOW (ref 39.0–48.0)
HEMOGLOBIN: 11.7 g/dL — ABNORMAL LOW (ref 12.9–16.5)
LYMPHOCYTES ABSOLUTE COUNT: 2.7 10*9/L (ref 1.1–3.6)
LYMPHOCYTES RELATIVE PERCENT: 32.3 %
MEAN CORPUSCULAR HEMOGLOBIN CONC: 34 g/dL (ref 32.0–36.0)
MEAN CORPUSCULAR HEMOGLOBIN: 32.9 pg — ABNORMAL HIGH (ref 25.9–32.4)
MEAN CORPUSCULAR VOLUME: 96.8 fL — ABNORMAL HIGH (ref 77.6–95.7)
MEAN PLATELET VOLUME: 8.5 fL (ref 6.8–10.7)
MONOCYTES ABSOLUTE COUNT: 0.9 10*9/L — ABNORMAL HIGH (ref 0.3–0.8)
MONOCYTES RELATIVE PERCENT: 10.9 %
NEUTROPHILS ABSOLUTE COUNT: 4.5 10*9/L (ref 1.8–7.8)
NEUTROPHILS RELATIVE PERCENT: 53.2 %
PLATELET COUNT: 157 10*9/L (ref 150–450)
RED BLOOD CELL COUNT: 3.56 10*12/L — ABNORMAL LOW (ref 4.26–5.60)
RED CELL DISTRIBUTION WIDTH: 13 % (ref 12.2–15.2)
WBC ADJUSTED: 8.4 10*9/L (ref 3.6–11.2)

## 2023-01-21 LAB — COMPREHENSIVE METABOLIC PANEL
ALBUMIN: 3.5 g/dL (ref 3.4–5.0)
ALKALINE PHOSPHATASE: 76 U/L (ref 46–116)
ALT (SGPT): 21 U/L (ref 10–49)
ANION GAP: 6 mmol/L (ref 5–14)
AST (SGOT): 27 U/L (ref <=34)
BILIRUBIN TOTAL: 0.6 mg/dL (ref 0.3–1.2)
BLOOD UREA NITROGEN: 31 mg/dL — ABNORMAL HIGH (ref 9–23)
BUN / CREAT RATIO: 28
CALCIUM: 9.7 mg/dL (ref 8.7–10.4)
CHLORIDE: 112 mmol/L — ABNORMAL HIGH (ref 98–107)
CO2: 25 mmol/L (ref 20.0–31.0)
CREATININE: 1.11 mg/dL
EGFR CKD-EPI (2021) MALE: 66 mL/min/{1.73_m2} (ref >=60)
GLUCOSE RANDOM: 106 mg/dL (ref 70–179)
POTASSIUM: 4.8 mmol/L (ref 3.4–4.8)
PROTEIN TOTAL: 5.7 g/dL (ref 5.7–8.2)
SODIUM: 143 mmol/L (ref 135–145)

## 2023-01-21 LAB — MAGNESIUM: MAGNESIUM: 2.2 mg/dL (ref 1.6–2.6)

## 2023-01-21 LAB — URIC ACID: URIC ACID: 6.2 mg/dL

## 2023-01-21 LAB — LACTATE DEHYDROGENASE: LACTATE DEHYDROGENASE: 148 U/L (ref 120–246)

## 2023-01-21 LAB — PHOSPHORUS: PHOSPHORUS: 3.6 mg/dL (ref 2.4–5.1)

## 2023-01-21 MED ADMIN — diphenhydrAMINE (BENADRYL) capsule/tablet 25 mg: 25 mg | ORAL | @ 17:00:00 | Stop: 2023-01-21 | NDC 97807007030

## 2023-01-21 MED ADMIN — obinutuzumab 1,000 mg in sodium chloride (NS) 0.9 % 250 mL IVPB: 1000 mg | INTRAVENOUS | @ 18:00:00 | Stop: 2023-01-21 | NDC 53191040901

## 2023-01-21 MED ADMIN — sodium chloride 0.9% (NS) bolus 250 mL: 250 mL | INTRAVENOUS | @ 17:00:00 | Stop: 2023-01-21 | NDC 05928011001

## 2023-01-21 MED ADMIN — acetaminophen (TYLENOL) tablet 650 mg: 650 mg | ORAL | @ 17:00:00 | Stop: 2023-01-21 | NDC 50580048790

## 2023-01-21 MED ADMIN — sodium chloride (NS) 0.9 % infusion: 100 mL/h | INTRAVENOUS | @ 20:00:00 | NDC 05928011001

## 2023-01-21 MED ADMIN — dexAMETHasone (DECADRON) tablet 20 mg: 20 mg | ORAL | @ 17:00:00 | Stop: 2023-01-21 | NDC 00052079907

## 2023-01-21 NOTE — Unmapped (Signed)
Chemo Clarification Note    Notified of Uric Acid 6.2. Treatment parameters Uric Acid < 6.0. However, the remainder of TLS labs are wnl. Patient will get 250 ml NS IVF bolus for rehydration. Okay to proceed with treatment today. Clarification placed and nursing staff made aware.        Lab Results   Component Value Date    WBC 8.4 01/21/2023    HGB 11.7 (L) 01/21/2023    HCT 34.5 (L) 01/21/2023    PLT 157 01/21/2023       Lab Results   Component Value Date    NA 143 01/21/2023    K 4.8 01/21/2023    CL 112 (H) 01/21/2023    CO2 25.0 01/21/2023    BUN 31 (H) 01/21/2023    CREATININE 1.11 01/21/2023    GLU 106 01/21/2023    CALCIUM 9.7 01/21/2023    MG 2.2 01/21/2023    PHOS 3.6 01/21/2023       Lab Results   Component Value Date    BILITOT 0.6 01/21/2023    PROT 5.7 01/21/2023    ALBUMIN 3.5 01/21/2023    ALT 21 01/21/2023    AST 27 01/21/2023    ALKPHOS 76 01/21/2023       No results found for: PT, INR, APTT       Erlinda Hong, PA   Hematology/Oncology Advanced Practice Provider

## 2023-01-21 NOTE — Unmapped (Signed)
Patient arrived to chair 15 for scheduled infusion today. Labs resulted within parameter for tx. PIV was flushed, + BR. Premeds given. IVFB started. Pt. Reached max rate on his infusion without complication, and was transferred to [employer] at shift change.

## 2023-01-21 NOTE — Unmapped (Signed)
Lab on 01/21/2023   Component Date Value Ref Range Status    Sodium 01/21/2023 143  135 - 145 mmol/L Final    Potassium 01/21/2023 4.8  3.4 - 4.8 mmol/L Final    Chloride 01/21/2023 112 (H)  98 - 107 mmol/L Final    CO2 01/21/2023 25.0  20.0 - 31.0 mmol/L Final    Anion Gap 01/21/2023 6  5 - 14 mmol/L Final    BUN 01/21/2023 31 (H)  9 - 23 mg/dL Final    Creatinine 16/03/9603 1.11  0.73 - 1.18 mg/dL Final    BUN/Creatinine Ratio 01/21/2023 28   Final    eGFR CKD-EPI (2021) Male 01/21/2023 66  >=60 mL/min/1.36m2 Final    eGFR calculated with CKD-EPI 2021 equation in accordance with [employer] and [employer] of Nephrology Task Force recommendations.    Glucose 01/21/2023 106  70 - 179 mg/dL Final    Calcium 54/02/8118 9.7  8.7 - 10.4 mg/dL Final    Albumin 14/78/2956 3.5  3.4 - 5.0 g/dL Final    Total Protein 01/21/2023 5.7  5.7 - 8.2 g/dL Final    Total Bilirubin 01/21/2023 0.6  0.3 - 1.2 mg/dL Final    AST 21/30/8657 27  <=34 U/L Final    ALT 01/21/2023 21  10 - 49 U/L Final    Alkaline Phosphatase 01/21/2023 76  46 - 116 U/L Final    Magnesium 01/21/2023 2.2  1.6 - 2.6 mg/dL Final    Phosphorus 84/69/6295 3.6  2.4 - 5.1 mg/dL Final    LDH 28/41/3244 148  120 - 246 U/L Final    Uric Acid 01/21/2023 6.2  3.7 - 9.2 mg/dL Final    WBC 06/25/7251 8.4  3.6 - 11.2 10*9/L Final    RBC 01/21/2023 3.56 (L)  4.26 - 5.60 10*12/L Final    HGB 01/21/2023 11.7 (L)  12.9 - 16.5 g/dL Final    HCT 66/44/0347 34.5 (L)  39.0 - 48.0 % Final    MCV 01/21/2023 96.8 (H)  77.6 - 95.7 fL Final    MCH 01/21/2023 32.9 (H)  25.9 - 32.4 pg Final    MCHC 01/21/2023 34.0  32.0 - 36.0 g/dL Final    RDW 42/59/5638 13.0  12.2 - 15.2 % Final    MPV 01/21/2023 8.5  6.8 - 10.7 fL Final    Platelet 01/21/2023 157  150 - 450 10*9/L Final    Neutrophils % 01/21/2023 53.2  % Final    Lymphocytes % 01/21/2023 32.3  % Final    Monocytes % 01/21/2023 10.9  % Final    Eosinophils % 01/21/2023 3.0  % Final    Basophils % 01/21/2023 0.6  % Final    Absolute Neutrophils 01/21/2023 4.5  1.8 - 7.8 10*9/L Final    Absolute Lymphocytes 01/21/2023 2.7  1.1 - 3.6 10*9/L Final    Absolute Monocytes 01/21/2023 0.9 (H)  0.3 - 0.8 10*9/L Final    Absolute Eosinophils 01/21/2023 0.2  0.0 - 0.5 10*9/L Final    Absolute Basophils 01/21/2023 0.0  0.0 - 0.1 10*9/L Final

## 2023-01-22 NOTE — Unmapped (Signed)
Care taken over, infusing obinutuzumab per PIV, completed remainder of treatment. Tolerated well, AVS declined, PIV removed and discharged home.

## 2023-01-23 ENCOUNTER — Ambulatory Visit
Admission: RE | Admit: 2023-01-23 | Discharge: 2023-01-23 | Disposition: A | Discharge status: Home / Self Care | Payer: Medicare Other | Source: Ambulatory Visit | Attending: Neurology | Admitting: Neurology

## 2023-01-23 DIAGNOSIS — R29810 Facial weakness: Secondary | ICD-10-CM | POA: Diagnosis not present

## 2023-01-27 ENCOUNTER — Encounter: Payer: Self-pay | Admitting: Gastroenterology

## 2023-02-03 NOTE — Progress Notes (Addendum)
Anesthesia Review:  PCP: Cardiologist : Chest x-ray : EKG : Echo : Stress test: Cardiac Cath :  Activity level:  Sleep Study/ CPAP : Fasting Blood Sugar :      / Checks Blood Sugar -- times a day:   Blood Thinner/ Instructions /Last Dose: ASA / Instructions/ Last Dose :  LVMM on 02/03/23.  On 228 number.  NO answer on 675 number.

## 2023-02-04 ENCOUNTER — Telehealth: Payer: Self-pay | Admitting: Gastroenterology

## 2023-02-04 NOTE — Telephone Encounter (Signed)
Inbound call from patient stating he would like to reschedule 8/20 [facility] procedure due to still undergoing treatment at [facility]. Please advise, thank you.

## 2023-02-04 NOTE — Telephone Encounter (Signed)
Called and spoke with pt to inform him that we cancelled Endo/colon appt at [facility] on 02/10/23 as requested. Pt states that he will call us back to reschedule.

## 2023-02-04 NOTE — Progress Notes (Signed)
Unable to contact pt. On any of the 3 phone numbers listed.As per pt's significant other,he is thinking to postpone endoscopy procedure.RN advised Ms. Smith,to get in contact with Dr. Cristela Blue office at [phone] ,to notify them about pt's decision.

## 2023-02-10 ENCOUNTER — Ambulatory Visit (HOSPITAL_COMMUNITY): Admit: 2023-02-10 | Payer: Medicare Other | Admitting: Gastroenterology

## 2023-02-10 ENCOUNTER — Encounter (HOSPITAL_COMMUNITY): Payer: Self-pay

## 2023-02-10 SURGERY — ESOPHAGOGASTRODUODENOSCOPY (EGD) WITH PROPOFOL
Anesthesia: Monitor Anesthesia Care

## 2023-02-10 MED FILL — VALACYCLOVIR 500 MG TABLET: ORAL | 30 days supply | Qty: 30 | Fill #3

## 2023-02-10 MED FILL — CALQUENCE (ACALABRUTINIB MALEATE) 100 MG TABLET: ORAL | 30 days supply | Qty: 60 | Fill #5

## 2023-02-10 NOTE — Unmapped (Signed)
[facility] Refill Coordination Note    Specialty Medication(s) to be Shipped:   Hematology/Oncology: Calquence    Other medication(s) to be shipped:  Valtrex     Brendan Bishop, DOB: 12/29/1940  Phone: [phone] (home) [phone] (work)      All above HIPAA information was verified with patient.     Was a Nurse, learning disability used for this call? No    Completed refill call assessment today to schedule patient's medication shipment from the [facility] [phone]).  All relevant notes have been reviewed.     Specialty medication(s) and dose(s) confirmed: Regimen is correct and unchanged.   Changes to medications: Jalynn reports no changes at this time.  Changes to insurance: No  New side effects reported not previously addressed with a pharmacist or physician: None reported  Questions for the pharmacist: No    Confirmed patient received a Conservation officer, historic buildings and a Surveyor, mining with first shipment. The patient will receive a drug information handout for each medication shipped and additional FDA Medication Guides as required.       DISEASE/MEDICATION-SPECIFIC INFORMATION        N/A    SPECIALTY MEDICATION ADHERENCE     Medication Adherence    Patient reported X missed doses in the last month: 0  Specialty Medication: Calquence 100mg   Patient is on additional specialty medications: No  Informant: patient     Were doses missed due to medication being on hold? No    Calquence 100 mg: 3 days of medicine on hand       REFERRAL TO PHARMACIST     Referral to the pharmacist: Not needed      [place] [facility]     Shipping address confirmed in Epic.       Delivery Scheduled: Yes, Expected medication delivery date: 02/10/23.     Medication will be delivered via Same Day Courier to the prescription address in Epic [place].    Wyatt Mage M Elisabeth Cara   [facility] Specialty Technician

## 2023-02-13 NOTE — Unmapped (Signed)
-----   Message from Joaquin Bend, RN sent at 02/13/2023 12:21 PM EDT -----  Hi!    I spoke to Guthrie as he was scheduled to get a colonoscopy last week. I was going to retrieve the results, but he states that after some discussion with the GI provider and his PCP, they agreed that the procedure was too risky for him to do at this time.    He states that he will talk to you about it when he sees Dr. Zonia Kief on 9/4.    Thanks    [employer]

## 2023-02-24 ENCOUNTER — Ambulatory Visit: Admit: 2023-02-24 | Discharge: 2023-02-24 | Payer: MEDICARE

## 2023-02-24 MED ADMIN — iohexol (OMNIPAQUE) 350 mg iodine/mL solution 100 mL: 100 mL | INTRAVENOUS | @ 13:00:00 | Stop: 2023-02-24

## 2023-02-25 ENCOUNTER — Ambulatory Visit: Admit: 2023-02-25 | Discharge: 2023-02-26 | Payer: MEDICARE | Attending: Hematology | Primary: Hematology

## 2023-02-25 ENCOUNTER — Other Ambulatory Visit: Admit: 2023-02-25 | Discharge: 2023-02-25 | Payer: MEDICARE

## 2023-02-25 ENCOUNTER — Ambulatory Visit: Admit: 2023-02-25 | Discharge: 2023-02-25 | Payer: MEDICARE

## 2023-02-25 DIAGNOSIS — C9110 Chronic lymphocytic leukemia of B-cell type not having achieved remission: Principal | ICD-10-CM

## 2023-02-25 LAB — COMPREHENSIVE METABOLIC PANEL
ALBUMIN: 3.6 g/dL (ref 3.4–5.0)
ALKALINE PHOSPHATASE: 86 U/L (ref 46–116)
ALT (SGPT): 18 U/L (ref 10–49)
ANION GAP: 6 mmol/L (ref 5–14)
AST (SGOT): 27 U/L (ref <=34)
BILIRUBIN TOTAL: 0.4 mg/dL (ref 0.3–1.2)
BLOOD UREA NITROGEN: 27 mg/dL — ABNORMAL HIGH (ref 9–23)
BUN / CREAT RATIO: 24
CALCIUM: 9.8 mg/dL (ref 8.7–10.4)
CHLORIDE: 111 mmol/L — ABNORMAL HIGH (ref 98–107)
CO2: 24 mmol/L (ref 20.0–31.0)
CREATININE: 1.14 mg/dL
EGFR CKD-EPI (2021) MALE: 64 mL/min/{1.73_m2} (ref >=60)
GLUCOSE RANDOM: 104 mg/dL (ref 70–179)
POTASSIUM: 5 mmol/L — ABNORMAL HIGH (ref 3.4–4.8)
PROTEIN TOTAL: 5.9 g/dL (ref 5.7–8.2)
SODIUM: 141 mmol/L (ref 135–145)

## 2023-02-25 LAB — CBC W/ AUTO DIFF
BASOPHILS ABSOLUTE COUNT: 0.1 10*9/L (ref 0.0–0.1)
BASOPHILS RELATIVE PERCENT: 0.6 %
EOSINOPHILS ABSOLUTE COUNT: 0.4 10*9/L (ref 0.0–0.5)
EOSINOPHILS RELATIVE PERCENT: 3.9 %
HEMATOCRIT: 37.5 % — ABNORMAL LOW (ref 39.0–48.0)
HEMOGLOBIN: 12.6 g/dL — ABNORMAL LOW (ref 12.9–16.5)
LYMPHOCYTES ABSOLUTE COUNT: 1.9 10*9/L (ref 1.1–3.6)
LYMPHOCYTES RELATIVE PERCENT: 20.9 %
MEAN CORPUSCULAR HEMOGLOBIN CONC: 33.7 g/dL (ref 32.0–36.0)
MEAN CORPUSCULAR HEMOGLOBIN: 32.8 pg — ABNORMAL HIGH (ref 25.9–32.4)
MEAN CORPUSCULAR VOLUME: 97.2 fL — ABNORMAL HIGH (ref 77.6–95.7)
MEAN PLATELET VOLUME: 8.7 fL (ref 6.8–10.7)
MONOCYTES ABSOLUTE COUNT: 0.8 10*9/L (ref 0.3–0.8)
MONOCYTES RELATIVE PERCENT: 8.4 %
NEUTROPHILS ABSOLUTE COUNT: 6 10*9/L (ref 1.8–7.8)
NEUTROPHILS RELATIVE PERCENT: 66.2 %
PLATELET COUNT: 183 10*9/L (ref 150–450)
RED BLOOD CELL COUNT: 3.86 10*12/L — ABNORMAL LOW (ref 4.26–5.60)
RED CELL DISTRIBUTION WIDTH: 12.9 % (ref 12.2–15.2)
WBC ADJUSTED: 9.1 10*9/L (ref 3.6–11.2)

## 2023-02-25 NOTE — Unmapped (Signed)
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 male with a history of multiple cancers, who presents for follow-up of biphenotypic LPD.    #Biphenotypic B-cell lymphoproliferative disorder  -He has 2 distinct abnormal monoclonal B-cell populations.  One is classic for CLL with CD5+ and dim CD20 and kappa restricted.  The other is more consistent with marginal zone lymphoma which is CD5-/CD10-/CD20+ and lambda restricted.  As all pathology has been equal distribution of both cell types, it is unclear which or if both are contributing equally to the progressive lymphocytosis.  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  -Due to rapidly progressive lymphocytosis and progressive lymphadenopathy and anemia, he meets criteria for treatment.  -Interestingly, his peripheral blood flow from 07/23/22 showed 60% of the CD5- population and 10% of the CD5+ population, while the bone marrow biopsy from 08/06/22 demonstrated 90% of the CD5+ (although both populations were detected by [facility] sequencing)  -Previously discussed potential treatment options, but the best option that would be effective against both clones is the combination of obinutuzumab and acalabrutinib as given for frontline CLL therapy in the [facility] [place] study. Obinutuzumab would be given for 6 months and acalabrutinib would be given continuously.  -Potential side effects include but are not limited to infusion reaction, infection, neutropenia, diarrhea, nausea, heartburn, joint or muscle aches, bleeding, bruising, atrial fibrillation, hypertension, and headache.  -He initiated therapy with obinutuzumab and acalabrutinib 09/03/22.   -Labs and exam appropriate to continue therapy today.  - 02/24/23 CT c/a/p shows complete resolution of all lymphadenopathy consistent with a CR by CT criteria.  Since the plan will be for continuous acalabrutinib, will not complete a bone marrow biopsy to confirm CR.  Plan for a repeat of CT scans ~6/24. Extend clinical follow-up to 3 months.    #Tick Bite Pt with multiple tick bites in early June, one of which was buried in skin for 3-4 days. Tick was removed in entirety and since removal patient with increased fatigue. No fevers, chills, HA, photosensitivity, neck pain.  - 11/26/22 Lyme, Ehrlichia, RMSF PCR negative  - Empirically treated with doxycycline x 7 days- patient did not complete the course  - Offered referral to derm but he declines at this time.    #LLE pain, resolved: Noted at last visit: 2 day history pain to the L posterior knee after sitting. Pain remains when on stairs and getting and out of truck  - PVL to r/o DVT, negative    Possible TIA: Patient had visit with neurology late July with subtle new L facial droop. MRI 01/23/23 shows no evidence of stroke. Denies other deficits/weakness. Denies HA.  - Continue following with neurology                                                  #  Anemia  -Likely related to B-cell malignancy as above.  -Also iron deficient.  Recommended increase iron supplementation to ferrous sulfate 325mg  every other day.  Recheck 09/03/22 with continued low Tsat at 14% and Ferritin at 40.  Hgb down to 10.  He received IV iron on 10/03/22.  There is mild improvement in Fe panel. We will continue with PO supplementation.   -He had a colonoscopy in 2022 and said that he had polyps and would have to return in 2027 for next one.  He had consultation visit on 6/13 with Dr. Adela Lank at [facility] in [place] for EGD/colonoscopy, determined that he is not a candidate for colonoscopy d/t age and comorbidities.  -Also of note, he previously had a bladder tumor that was causing bleeding.  He underwent repeat cystoscopy on 10/22/22 with Dr. Legrand Rams.  No evidence of recurrent disease.  He has requested repeat cystoscopy in 9 months, which should be ~2/25.  -We have reviewed with him that acalabrutinib can worsen bleeding and if he notices any new bleeding, he knows to contact us immediately.    #Dysphagia to solids   -Previously needed an esophageal dilation.  -EGD canceled by local GI given high risk deemed by Cardiologist  -Patient does not have bothersome dysphagia at this time so I think it is okay to defer for now. Patient also prefers to avoid scope.    #Rash, resolved  Eruption involving left > right side of back and right leg. No clear vesicles were noted, and the rash crosses the midline. Will prescribe topical steroid for possible drug-related eruption. Advised patient to alert [place] to worsening or evolution of rash. Mild ecchymotic appearance to R anterior shin, no distinct raised features.    #Falls:  - Fall on deck last week, did not hit head/LOC. No bone/joint pain.  - Falls typically 1 times per month. Notes poor balance s/p CVA in 2011. Did to PT for a short time 3 years ago but does not feel it was helpful nor does not have the time to continue.   - He declined PT and Dr Sudie Bailey at this time given time commitment required    #Skin excoriation left temple.  -Recommended dermatology evaluation as this is suspicious for SCC.  He has a Ship broker who he will contact. He was seen by local Derm on 02/21/23 and had skin lesion froze off  [facility] internal referral placed however patient is not able to make it to these appts on Tuesdays.    #Multiple medical comorbidities.  -Managed by PCP and other specialists.      #. Preventive  Cancer screening:  Patients with CLL/SLL are at increased risk of developing secondary malignancies.  Patient is not currently due for screening.  Annual dermatology evaluation, due to increased risk of non-melanomatous skin cancers.   If needed, blood products for transfusion should be irradiated.  Risk of Hypogammaglobulinemia: Baseline IgG levels 03/11/19 = 698, 07/23/22 1095  Vaccination  Avoid all live vaccines.  Recommend annual influenza vaccine.  Last given: 05/28/22  Recommend COVID vaccine series and booster.  Last given:  04/13/20  Recommend pneumococcal vaccine with Prevnar 20.  Will need to receive this 6 months after completion of obinutuzumab ~2/25  Recommend RSV vaccine. Last given:  07/18/22  Recommend Zoster vaccine recombinant, adjuvanted (Shingrix).  Last given: 01/18/13 (chart lists as zostavax, will need to confirm)     -RTC in 3 months.      In total I spent  60 minutes in this visit which consisted of collecting a history and making medical management decisions for the diagnoses listed in my note, as well as reviewing and updating the patient's medical record, coordinating care, and documenting before, during and after the visit.    Chilton Si, DO  Associate Professor of Medicine  Division of Hematology  [facility]  [facility] [place] [facility] [place]                  Chief Complaint:     Chief Complaint   Patient presents with    Routine Follow-up       Oncology/Treatment History:     Hematology/Oncology History   CLL (chronic lymphocytic leukemia) (CMS-HCC)   05/21/2021 Initial Diagnosis    CLL (chronic lymphocytic leukemia) (CMS-HCC)  -While he was officially diagnosed in 2022, he had a mild lymphocytosis dating back to 2017, when he was undergoing radiation for his prostate cancer.  On 02/08/2016, he had a flow cytometry, which showed a small population of monoclonal B-cells that were CD5-/CD10- with a total of 2.9k/uL clonal cells.  Observation was recommended.   -further evaluation of asymptomatic lymphocytosis ensued 05/21/2021.  -WBC 20.1, Hgb 12.5, Plt 238, ALC 13.8, ANC 3.96, B2M Not done, LDH 116 (ULN 192)  -Flow showed monoclonal B-cell population consistent with CLL with absolute clonal count of 3.74 k/UL, however a separate population of CD5-/CD10- monoclonal B-cells was detected  -Rai Stage: 1  -IGHV: Unmutated  -FISH showed: Normal  -Karyotype showed: Not completed.  -Recommendation: observation       07/01/2021 Interval Scan(s)    PET scan with scattered LAD, max size 2.0cm, No hepatosplenomegaly.     07/05/2021 Biopsy    Bone marrow biopsy showed hypercellular bone marrow with involvement of a low-grade B-cell lymphoproliferative disorder.  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 Ki-67 stain is variable in the interfollicular areas, 10% to 30%.   There are two separate B cell clones, in roughly equal proportions:   1) Kappa light chain restricted (dim expression), CD20+ (dim), CD5+, CD23+, CD43+, FMC-   2) Lambda light chain restricted, CD20+, CD5-, CD23+, CD43-, FMC+(dim).      06/26/2022 Interval Scan(s)    PET scan with continued LAD with mild progression.  Largest node 2.4cm in mesentery.     07/23/2022 Molecular Markers    -FISH showed: Normal  -Karyotype showed: 46,XY,del(7)(q31q32)[14]/46,XY[6]   -TP53 mutation was detected c.375G>A      08/05/2022 Biopsy    Bone marrow biopsy:  -  Hypercellular bone marrow (90%) primarily involved by CD5+ B-cell lymphoma/leukemia, consistent with chronic lymphocytic leukemia, representing approximately 70% of marrow cellularity by immunohistochemistry (see Comment)  -  Clonal immunoglobulin heavy (IGH) and kappa light chain (IGK) gene rearrangements identified (see Comment)      09/03/2022 -  Targeted Therapy    Obinutuzumab + acalabrutinib 02/24/2023 Interval Scan(s)    Complete resolution of lymph nodes.           History Obtained From:   Patient and his significant other.    History of Present Illness:   NORBERTO PROFFITT is a 82 y.o. male with a history of multiple cancers, who presents with progressive lymphocytosis due to his lymphoproliferative disorder and consultation for treatment recommendations.  For detailed oncologic and treatment (if applicable) history, please see above.  Interim History   Since his last visit, Mr. Berenson is doing well.  Overall improved energy. He does notice increased mosquito bites when outdoors. He does a lot of work outside. Denies f/c/ns. Denies abd pain. Denies n/v/d.    Last visit, there was concern his Neurologist was concerned he had a CVA due to subtle L sided facial droop. MRI Brain negative for acute stroke, remote CVA seen. He will follow up with them in 6 months. No HAs. Has poor balance but this is not new. No falls since last visit.    He does have some mild bruising to his arms.    He denies new constitutional symptoms such as anorexia, weight loss, night sweats or unexplained fevers.  Furthermore, he denies symptoms of marrow failure: unexplained bleeding, recurrent or unexplained intercurrent infections, dyspnea on exertion, lightheadedness, palpitations or chest pain.  There have been no new or unexplained pains or self-identified masses, swelling or enlarged lymph nodes.     Review of Systems   Complete ROS performed and was negative except what is noted in interim history above.    Past Medical History     Past Medical History:   Diagnosis Date    Bladder cancer (CMS-HCC) 09/13/2021    CAD (coronary artery disease)     CLL (chronic lymphocytic leukemia) (CMS-HCC)     GERD (gastroesophageal reflux disease)     Hyperlipidemia     Hypertension     Marginal zone B-cell lymphoma (CMS-HCC)     Multinodular goiter     OSA (obstructive sleep apnea)     Prostate cancer (CMS-HCC) 10/2015    Stroke (CMS-HCC) 2011       Past Surgical History:     Past Surgical History:   Procedure Laterality Date    BIOPSY OF THYROID [facility] HISTORICAL RESULT)      BONE MARROW BIOPSY      CARPAL TUNNEL RELEASE      CATARACT EXTRACTION Bilateral     CORONARY ARTERY BYPASS GRAFT  2005    HEMORRHOID SURGERY  2002    LYMPH NODE BIOPSY  07/08/2021    PAROTIDECTOMY Left 08/04/2017    TONSILLECTOMY AND ADENOIDECTOMY      TOTAL KNEE ARTHROPLASTY Left 2014    TOTAL KNEE ARTHROPLASTY Right 05/17/2018    TRANSURETHRAL RESECTION OF BLADDER TUMOR  09/13/2021       Allergies:     No Known Allergies      Family History:     Family History   Problem Relation Age of Onset    Heart disease Mother     Parkinsonism Father     Alzheimer's disease Father     Diabetes Paternal Grandfather        Social History:     Social History     Tobacco Use    Smoking status: Never    Smokeless tobacco: Never   Vaping Use    Vaping status: Never Used   Substance Use Topics    Alcohol use: Not Currently    Drug use: Never       Medications:     Current Outpatient Medications   Medication Sig Dispense Refill    acalabrutinib (CALQUENCE) 100 mg tablet Take 1 tablet (100 mg total) by mouth two (2) times a day. Swallow whole with water. Do not to chew, crush, dissolve, or cut tablets. 60 tablet 11    acetaminophen (TYLENOL) 325 MG tablet Take 1.5 tablets (487.5 mg total) by mouth nightly. 100 tablet  2    allopurinol (ZYLOPRIM) 300 MG tablet Take 1 tablet (300 mg total) by mouth daily. 30 tablet 1    ascorbic acid, vitamin C, (VITAMIN C) 1000 MG tablet Take 1 tablet (1,000 mg total) by mouth.      aspirin (ECOTRIN) 81 MG tablet Take 1 tablet (81 mg total) by mouth.      atorvastatin (LIPITOR) 80 MG tablet       b complex vitamins (SUPER B-50 COMPLEX) capsule Take 1 capsule by mouth daily. 30 capsule 11    bimatoprost (LUMIGAN) 0.01 % Drop Administer 1 drop to both eyes nightly. 30 mL 0    cholecalciferol, vitamin D3-250 mcg, 10,000 unit,, 250 mcg (10,000 unit) capsule Take 0.5 capsules (125 mcg total) by mouth daily. 120 capsule 2    coenzyme Q10 200 mg capsule Take 1 capsule (200 mg total) by mouth.      cyanocobalamin, vitamin B-12, 1000 MCG tablet Take 0.5 tablets (500 mcg total) by mouth daily. 30 tablet 0    ENTRESTO 24-26 mg tablet Take 1 tablet by mouth.      ferrous sulfate 325 (65 FE) MG tablet Take 1 tablet (325 mg total) by mouth every other day. 30 tablet 11    fish,bora,flax oils-om3,6,9no1 (OMEGA 3-6-9) 1,200 mg cap Take 1 tablet by mouth in the morning. 30 capsule 0    L.acidophil-L.plantar-Bifido 7 15 billion cell cap Take 5 mg by mouth in the morning. 30 capsule 0    lysine 1,000 mg Tab Take 1,000 mg by mouth.      magnesium gluconate (MAGONATE) 500 mg (27 mg elem magnesium) tablet Take 1 tablet by mouth daily. 30 tablet 0    midodrine (PROAMATINE) 5 MG tablet Take 1 tablet (5 mg total) by mouth Three (3) times a day.      mv-min-FA-vit K-lutein-zeaxant (PRESERVISION AREDS 2 PLUS MV) 200 mcg-15 mcg- 5 mg-1 mg cap Take 1 tablet by mouth in the morning. 30 capsule 0    NON FORMULARY Astralagus, Black elderberry, reishii      ondansetron (ZOFRAN) 4 MG tablet Take 1 tablet (4 mg) by mouth once a day as needed for nausea. 30 tablet 0    selenium 200 mcg Tab Take 1 tablet (200 mcg total) by mouth.      spironolactone (ALDACTONE) 25 MG tablet Take 1 tablet (25 mg total) by mouth daily.      tamsulosin (FLOMAX) 0.4 mg capsule       traMADol (ULTRAM) 50 mg tablet TAKE 1 TO 2 TABLETS BY MOUTH THREE TIMES DAILY AS NEEDED FOR PAIN      triamcinolone (KENALOG) 0.1 % ointment Apply topically two (2) times a day. 30 g 0    valACYclovir (VALTREX) 500 MG tablet Take 1 tablet (500 mg total) by mouth daily. 30 tablet 5    zinc gluconate 50 mg (7 mg elemental zinc) tablet Take 1 tablet (50 mg total) by mouth daily.       No current facility-administered medications for this visit.       Physical Exam:     PHYSICAL EXAMINATION:  ECOG: 2   VITAL SIGNS:  BP 93/54  - Pulse 86  - Temp 36.8 ??C (98.2 ??F) (Temporal)  - Resp 17  - Wt 77.3 kg (170 lb 6.7 oz)  - SpO2 96%  - BMI 27.51 kg/m??     General: well appearing, in no acute distress  HEENT: Sclera anicteric. Conjunctivae not injected, no neck pain or rigidity  noted.  Resp: Normal WOB, symmetric excursion, CTAB  CV: RRR without murmurs, rubs or gallops.    GI: Normal bowel sounds, abdomen soft, non-tender, non-distended  Skin: Scattered excoriations on arms and legs.  Neuro: Alert and oriented to conversation, EOM intact, no facial droop, speech fluent and coherent, moves all extremities without asymmetry and with at least antigravity strength  Psych: Normal affect  Extremities: No clubbing or lower extremity edema appreciated.  Mild compression stocking imprints apparent to bilateral to shin.  LYMPH: No palpable cervical, supraclavicular, axillary, inguinal lymphadenopathy. Palpable cervical glands, not distinct LN.      Labs:   See oncology history      Pathology:   See oncology history    Imaging:   I personally reviewed all of the patient's pertinent images and documented any relevant findings in the assessment/plan.  See outside records.    See HPI

## 2023-03-05 NOTE — Unmapped (Signed)
[facility] Clinical Assessment & Refill Coordination Note    Brendan Bishop, DOB: January 07, 1941  Phone: [phone] (home) [phone] (work)    All above HIPAA information was verified with patient.     Was a Nurse, learning disability used for this call? No    Specialty Medication(s):   Hematology/Oncology: Calquence     Current Outpatient Medications   Medication Sig Dispense Refill    acalabrutinib (CALQUENCE) 100 mg tablet Take 1 tablet (100 mg total) by mouth two (2) times a day. Swallow whole with water. Do not to chew, crush, dissolve, or cut tablets. 60 tablet 11    acetaminophen (TYLENOL) 325 MG tablet Take 1.5 tablets (487.5 mg total) by mouth nightly. 100 tablet 2    allopurinol (ZYLOPRIM) 300 MG tablet Take 1 tablet (300 mg total) by mouth daily. 30 tablet 1    ascorbic acid, vitamin C, (VITAMIN C) 1000 MG tablet Take 1 tablet (1,000 mg total) by mouth.      aspirin (ECOTRIN) 81 MG tablet Take 1 tablet (81 mg total) by mouth.      atorvastatin (LIPITOR) 80 MG tablet       b complex vitamins (SUPER B-50 COMPLEX) capsule Take 1 capsule by mouth daily. 30 capsule 11    bimatoprost (LUMIGAN) 0.01 % Drop Administer 1 drop to both eyes nightly. 30 mL 0    cholecalciferol, vitamin D3-250 mcg, 10,000 unit,, 250 mcg (10,000 unit) capsule Take 0.5 capsules (125 mcg total) by mouth daily. 120 capsule 2    coenzyme Q10 200 mg capsule Take 1 capsule (200 mg total) by mouth.      cyanocobalamin, vitamin B-12, 1000 MCG tablet Take 0.5 tablets (500 mcg total) by mouth daily. 30 tablet 0    ENTRESTO 24-26 mg tablet Take 1 tablet by mouth.      ferrous sulfate 325 (65 FE) MG tablet Take 1 tablet (325 mg total) by mouth every other day. 30 tablet 11    fish,bora,flax oils-om3,6,9no1 (OMEGA 3-6-9) 1,200 mg cap Take 1 tablet by mouth in the morning. 30 capsule 0    L.acidophil-L.plantar-Bifido 7 15 billion cell cap Take 5 mg by mouth in the morning. 30 capsule 0    lysine 1,000 mg Tab Take 1,000 mg by mouth.      magnesium gluconate (MAGONATE) 500 mg (27 mg elem magnesium) tablet Take 1 tablet by mouth daily. 30 tablet 0    midodrine (PROAMATINE) 5 MG tablet Take 1 tablet (5 mg total) by mouth Three (3) times a day.      mv-min-FA-vit K-lutein-zeaxant (PRESERVISION AREDS 2 PLUS MV) 200 mcg-15 mcg- 5 mg-1 mg cap Take 1 tablet by mouth in the morning. 30 capsule 0    NON FORMULARY Astralagus, Black elderberry, reishii      ondansetron (ZOFRAN) 4 MG tablet Take 1 tablet (4 mg) by mouth once a day as needed for nausea. 30 tablet 0    selenium 200 mcg Tab Take 1 tablet (200 mcg total) by mouth.      spironolactone (ALDACTONE) 25 MG tablet Take 1 tablet (25 mg total) by mouth daily.      tamsulosin (FLOMAX) 0.4 mg capsule       traMADol (ULTRAM) 50 mg tablet TAKE 1 TO 2 TABLETS BY MOUTH THREE TIMES DAILY AS NEEDED FOR PAIN      triamcinolone (KENALOG) 0.1 % ointment Apply topically two (2) times a day. 30 g 0    valACYclovir (VALTREX) 500 MG tablet  Take 1 tablet (500 mg total) by mouth daily. 30 tablet 5    zinc gluconate 50 mg (7 mg elemental zinc) tablet Take 1 tablet (50 mg total) by mouth daily.       No current facility-administered medications for this visit.        Changes to medications: Brendan Bishop reports no changes at this time.    No Known Allergies    Changes to allergies: No    SPECIALTY MEDICATION ADHERENCE     Calquence 100 mg: ~14 days of medicine on hand     Are there any concerns with adherence? No    Adherence counseling provided? Not needed    Patient-Reported Symptoms Tracker for Cancer Patients on Oral Chemotherapy     Oral chemotherapy medication name(s): Calquence  Dose and frequency: 100 mg twice daily  Oral Chemotherapy Start Date:    Baseline? No  Clinic(s) visited: Hematology    Symptom Grouping Question Patient Response   Digestion and Eating Have you felt sick to your stomach? Denies    Had diarrhea? Denies    Constipated? Denies    Not wanting to eat? Denies    Comments      Sleep and Pain Felt very tired even after you rest? Denies    Pain due to cancer medication or cancer? Denies    Comments     Other Side Effects Numbness or tingling in hands and/or feet? Denies    Felt short of breath? Denies    Mouth or throat Sores? Denies    Rash? Denies    Palmar-plantar erythrodysesthesia syndrome?      Rash - acneiform?      Rash - maculo-papular?      How many days over the past month did your cancer medication or cancer keep you from your normal activities?  Write in number of days, 0-30:  0    Other side effects or things you would like to discuss?      Comments?     Adherence  In the last 30 days, on how many days did you miss at least one dose of any of your [drug name]? Write in number of days, 0-30:  0    What reasons are you having trouble taking your medication [pharmacist: check all that apply]? Specify chemotherapy cycle:        No problems identified    Comments:        Comments       Optional Symptom Tracking Comments: He feels he is doing very well on medication and is having good results    CLINICAL MANAGEMENT AND INTERVENTION      Clinical Benefit Assessment:    Do you feel the medicine is effective or helping your condition? Yes    Clinical Benefit counseling provided? Not needed    Acute Infection Status:    Acute infections noted within Epic:  No active infections    Patient reported infection: None    Therapy Appropriateness:    Is therapy appropriate based on current medication list, adverse reactions, adherence, clinical benefit and progress toward achieving therapeutic goals?  Yes, therapy is appropriate and should be continued    DISEASE/MEDICATION-SPECIFIC INFORMATION      N/A    Is the patient receiving adequate infection prevention treatment? Yes, valacyclovir    Does the patient have adequate nutritional support? Not applicable    PATIENT SPECIFIC NEEDS     Does the patient have any physical, cognitive, or cultural barriers?  No    Is the patient high risk? No    Did the patient require a clinical intervention? No    Does the patient require physician intervention or other additional services (i.e., nutrition, smoking cessation, social work)? No    SOCIAL DETERMINANTS OF HEALTH     At the [facility], we have learned that life circumstances - like trouble affording food, housing, utilities, or transportation can affect the health of many of our patients.   That is why we wanted to ask: are you currently experiencing any life circumstances that are negatively impacting your health and/or quality of life? No    Social Determinants of Psychologist, prison and probation services Strain: Not on file   Internet Connectivity: Not on file   Food Insecurity: Not on file   Tobacco Use: Medium Risk (01/07/2023)    Received from [facility]    Patient History     Smoking Tobacco Use: Former     Smokeless Tobacco Use: Never     Passive Exposure: Not on file   Housing/Utilities: Not on file   Alcohol Use: Unknown (09/29/2019)    Received from [facility], [facility]    AUDIT-C     Frequency of Alcohol Consumption: 2-3 times a week     Average Number of Drinks: Not on file     Frequency of Binge Drinking: Not on file   Transportation Needs: Not on file   Substance Use: Not on file   Health Literacy: Not on file   Physical Activity: Not on file   Interpersonal Safety: Unknown (03/05/2023)    Interpersonal Safety     Unsafe Where You Currently Live: Not on file     Physically Hurt by Anyone: Not on file     Abused by Anyone: Not on file   Stress: Not on file   Intimate Partner Violence: Not on file   Depression: Not at risk (12/24/2022)    PHQ-2     PHQ-2 Score: 0   Social Connections: Not on file     Would you be willing to receive help with any of the needs that you have identified today? Not applicable       SHIPPING     Specialty Medication(s) to be Shipped:   Hematology/Oncology: Calquence    Other medication(s) to be shipped:  Ferosul and valacyclovir Changes to insurance: No    Delivery Scheduled: Yes, Expected medication delivery date: 03/12/23.     Medication will be delivered via Next Day Courier to the confirmed prescription address in [facility].    The patient will receive a drug information handout for each medication shipped and additional FDA Medication Guides as required.  Verified that patient has previously received a Conservation officer, historic buildings and a Surveyor, mining.    The patient or caregiver noted above participated in the development of this care plan and knows that they can request review of or adjustments to the care plan at any time.      All of the patient's questions and concerns have been addressed.    Kermit Balo, [facility]   [facility] Specialty Pharmacist

## 2023-03-11 MED FILL — FEROSUL 325 MG (65 MG IRON) TABLET: ORAL | 90 days supply | Qty: 45 | Fill #1

## 2023-03-11 MED FILL — CALQUENCE (ACALABRUTINIB MALEATE) 100 MG TABLET: ORAL | 30 days supply | Qty: 60 | Fill #6

## 2023-03-11 MED FILL — VALACYCLOVIR 500 MG TABLET: ORAL | 30 days supply | Qty: 30 | Fill #4

## 2023-03-12 NOTE — Unmapped (Signed)
Patient Brendan Bishop was called in attempt number 1 to reach them regarding scheduling their upcoming appointments on 05/27/23.     The call resulted in: Voicemail full        Thank you,    Cephus Richer

## 2023-04-10 NOTE — Unmapped (Signed)
[facility] Pharm[facility] Refill Coordination Note    Brendan Bishop, DOB: 11/26/1940  Phone: [phone] (home) [phone] (work)      All above HIPAA information was verified with patient.         04/10/2023     3:12 PM   Specialty Rx Medication Refill Questionnaire   Which Medications would you like refilled and shipped? Calquence - 4 days, Valacycovir- 5 days   Please list all current allergies: None   Have you missed any doses in the last 30 days? No   Have you had any changes to your medication(s) since your last refill? No   How many days remaining of each medication do you have at home? 5   Have you experienced any side effects in the last 30 days? No   Please enter the full address (street address, city, state, zip code) where you would like your medication(s) to be delivered to. [address] [place], [place], [place] [address]   Please specify on which day you would like your medication(s) to arrive. Note: if you need your medication(s) within 3 days, please call the pharmacy to schedule your order at [phone]  04/14/2023   Has your insurance changed since your last refill? No   Would you like a pharmacist to call you to discuss your medication(s)? No   Do you require a signature for your package? (Note: if we are billing Medicare Part B or your order contains a controlled substance, we will require a signature) No         Completed refill call assessment today to schedule patient's medication shipment from the [facility] [phone]).  All relevant notes have been reviewed.       Confirmed patient received a Conservation officer, historic buildings and a Surveyor, mining with first shipment. The patient will receive a drug information handout for each medication shipped and additional FDA Medication Guides as required.         REFERRAL TO PHARMACIST     Referral to the pharmacist: Not needed      [facility]     Shipping address confirmed in Epic.     Delivery Scheduled: Yes, Expected medication delivery date: 04/14/23.     Medication will be delivered via Next Day Courier to the prescription address in Epic [place].    Brendan Bishop M Brendan Bishop   [facility] Specialty Technician

## 2023-04-14 MED FILL — VALACYCLOVIR 500 MG TABLET: ORAL | 30 days supply | Qty: 30 | Fill #5

## 2023-04-14 MED FILL — CALQUENCE (ACALABRUTINIB MALEATE) 100 MG TABLET: ORAL | 30 days supply | Qty: 60 | Fill #7

## 2023-05-05 MED ORDER — VALACYCLOVIR 500 MG TABLET
ORAL_TABLET | Freq: Every day | ORAL | 5 refills | 30 days | Status: CP
Start: 2023-05-05 — End: ?
  Filled 2023-05-19: qty 30, 30d supply, fill #0

## 2023-05-18 DIAGNOSIS — C9110 Chronic lymphocytic leukemia of B-cell type not having achieved remission: Principal | ICD-10-CM

## 2023-05-18 NOTE — Unmapped (Signed)
[facility] Refill Coordination Note    Specialty Medication(s) to be Shipped:   Hematology/Oncology: Calquence    Other medication(s) to be shipped:  Valacyclovir 500 mg     Brendan Bishop, DOB: 02-25-1941  Phone: [phone] (home) [phone] (work)      All above HIPAA information was verified with patient.     Was a Nurse, learning disability used for this call? No    Completed refill call assessment today to schedule patient's medication shipment from the [facility] [phone]).  All relevant notes have been reviewed.     Specialty medication(s) and dose(s) confirmed: Regimen is correct and unchanged.   Changes to medications: [name] reports no changes at this time.  Changes to insurance: No  New side effects reported not previously addressed with a pharmacist or physician: None reported  Questions for the pharmacist: No    Confirmed patient received a Conservation officer, historic buildings and a Surveyor, mining with first shipment. The patient will receive a drug information handout for each medication shipped and additional FDA Medication Guides as required.       DISEASE/MEDICATION-SPECIFIC INFORMATION        N/A    SPECIALTY MEDICATION ADHERENCE     Medication Adherence    Patient reported X missed doses in the last month: 0  Specialty Medication: Calquence 100 mg  Patient is on additional specialty medications: No  Informant: patient     Were doses missed due to medication being on hold? No    Calquence 100 mg: 5 days of medicine on hand        REFERRAL TO PHARMACIST     Referral to the pharmacist: Not needed      [facility]     Shipping address confirmed in Epic.       Delivery Scheduled: Yes, Expected medication delivery date: 05/20/23.     Medication will be delivered via Next Day Courier to the prescription address in Epic [place].    Brendan Bishop Brendan Bishop   [facility] Specialty Technician

## 2023-05-19 MED FILL — CALQUENCE (ACALABRUTINIB MALEATE) 100 MG TABLET: ORAL | 30 days supply | Qty: 60 | Fill #8

## 2023-05-26 NOTE — Unmapped (Signed)
[facility]  Primary Hematologist: Lawson Radar, DO   Specialty: CLL  Visit Type: Follow up - On treatment     Patient: Brendan Bishop  MRN: [id number]  DOB: February 19, 1941  DOS: 05/27/2023    Referring Physician: Laurice Record, F*    Chronic Lymphocytic Leukemia  Date of Diagnosis 05/21/2021   Rai Stage at Dx 1   Beta-2 microglobulin at Dx Not done   IGHV Mutational Status Un-mutated   FISH Normal   Cytogenetics 46,XY,del(7)(q31q32)[14]/46,XY[6]    Mutations TP53 mutation c.375G>A    CLL-IPI Score at Dx Unable to calculate, but at least 4 for age/stage/IGHV     Assessment/Plan:   82 y.o. male with a history of multiple cancers, who presents for follow-up of biphenotypic LPD.    #Biphenotypic B-cell lymphoproliferative disorder  -He has 2 distinct abnormal monoclonal B-cell populations.  One is classic for CLL with CD5+ and dim CD20 and kappa restricted.  The other is more consistent with marginal zone lymphoma which is CD5-/CD10-/CD20+ and lambda restricted.  As all pathology has been equal distribution of both cell types, it is unclear which or if both are contributing equally to the progressive lymphocytosis.  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  -Due to rapidly progressive lymphocytosis and progressive lymphadenopathy and anemia, he meets criteria for treatment.  -Interestingly, his peripheral blood flow from 07/23/22 showed 60% of the CD5- population and 10% of the CD5+ population, while the bone marrow biopsy from 08/06/22 demonstrated 90% of the CD5+ (although both populations were detected by IGH sequencing)  -Previously discussed potential treatment options, but the best option that would be effective against both clones is the combination of obinutuzumab and acalabrutinib as given for frontline CLL therapy in the [facility] [place] study. Obinutuzumab would be given for 6 months and acalabrutinib would be given continuously.  -Potential side effects include but are not limited to infusion reaction, infection, neutropenia, diarrhea, nausea, heartburn, joint or muscle aches, bleeding, bruising, atrial fibrillation, hypertension, and headache.  -He initiated therapy with obinutuzumab and acalabrutinib 09/03/22, he has completed 6 months of obi and is now on continuous acalabruitinib.  - 02/24/23 CT c/a/p shows complete resolution of all lymphadenopathy consistent with a CR by CT criteria.  Since the plan will be for continuous acalabrutinib, will not complete a bone marrow biopsy to confirm CR.  -Extend clinical follow-up to 3 months.    #Tick Bite, resolved Pt with multiple tick bites in early June, one of which was buried in skin for 3-4 days. Tick was removed in entirety and since removal patient with increased fatigue. No fevers, chills, HA, photosensitivity, neck pain.  - 11/26/22 Lyme, Ehrlichia, RMSF PCR negative  - Empirically treated with doxycycline x 7 days- patient did not complete the course  - Offered referral to derm but he declines at this time.    #LLE pain, resolved: Noted at last visit: 2 day history pain to the L posterior knee after sitting. Pain remains when on stairs and getting and out of truck  - PVL to r/o DVT, negative    Possible TIA: Patient had visit with neurology late July with subtle new L facial droop. MRI 01/23/23 shows no evidence of stroke. Denies other deficits/weakness. Denies HA.  - Continue following with neurology    Mild lower abd pain: ongoing for several months  - begin OTC PPI therapy    Inc WOB:   -this occurs in the  shower occasionally. Discussed drinking more fluids prior to shower as he may be having lower BP while hot shower and bath.                                                  #Anemia, mild, improved  -Likely related to B-cell malignancy as above.  -Also iron deficient.  Recommended increase iron supplementation to ferrous sulfate 325mg  every other day.  Recheck 09/03/22 with continued low Tsat at 14% and Ferritin at 40.  Hgb down to 10.  He received IV iron on 10/03/22.  There is mild improvement in Fe panel. We will continue with PO supplementation.   -He had a colonoscopy in 2022 and said that he had polyps and would have to return in 2027 for next one.  He had consultation visit on 6/13 with Dr. Adela Lank at [facility] in [place] for EGD/colonoscopy, determined that he is not a candidate for colonoscopy d/t age and comorbidities.  -Also of note, he previously had a bladder tumor that was causing bleeding.  He underwent repeat cystoscopy on 10/22/22 with Dr. Legrand Rams.  No evidence of recurrent disease.  He has requested repeat cystoscopy in 9 months, which should be ~2/25.  -We have reviewed with him that acalabrutinib can worsen bleeding and if he notices any new bleeding, he knows to contact us immediately.    #Dysphagia to solids   -Previously needed an esophageal dilation.  -EGD canceled by local GI given high risk deemed by Cardiologist  -Patient does not have bothersome dysphagia at this time so I think it is okay to defer for now. Patient also prefers to avoid scope.    #Rash, resolved  Eruption involving left > right side of back and right leg. No clear vesicles were noted, and the rash crosses the midline. Will prescribe topical steroid for possible drug-related eruption. Advised patient to alert [place] to worsening or evolution of rash. Mild ecchymotic appearance to R anterior shin, no distinct raised features.    #Falls:  - Fall on deck last week, did not hit head/LOC. No bone/joint pain.  - Falls typically 1 times per month. Notes poor balance s/p CVA in 2011. Did to PT for a short time 3 years ago but does not feel it was helpful nor does not have the time to continue.   - He declined PT and Dr Sudie Bailey at this time given time commitment required    #Skin excoriation left temple.  -Recommended dermatology evaluation as this is suspicious for SCC.  He has a Ship broker who he will contact. He was seen by local Derm on 02/21/23 and had skin lesion froze off  [facility] internal referral placed however patient is not able to make it to these appts on Tuesdays.    #Multiple medical comorbidities.  -Managed by PCP and other specialists.      #. Preventive  Cancer screening:  Patients with CLL/SLL are at increased risk of developing secondary malignancies.  Patient is not currently due for screening.  Annual dermatology evaluation, due to increased risk of non-melanomatous skin cancers.   If needed, blood products for transfusion should be irradiated.  Risk of Hypogammaglobulinemia: Baseline IgG levels 03/11/19 = 698, 07/23/22 1095  Vaccination  Avoid all live vaccines.  Recommend annual influenza vaccine.  Last given: 05/28/22  Recommend COVID vaccine  series and booster.  Last given:  04/13/20  Recommend pneumococcal vaccine with Prevnar 20.  Will need to receive this 6 months after completion of obinutuzumab ~2/25  Recommend RSV vaccine. Last given:  07/18/22  Recommend Zoster vaccine recombinant, adjuvanted (Shingrix).  Last given: 01/18/13 (chart lists as zostavax, will need to confirm)     -RTC in 3 months.      In total I spent 60 minutes in this visit which consisted of collecting a history and making medical management decisions for the diagnoses listed in my note, as well as reviewing and updating the patient's medical record, coordinating care, and documenting before, during and after the visit.    Cruzita Lederer, MSN, FNP-BC, [facility]  Nurse Practitioner  [facility] Hematology/Oncology                   Chief Complaint:     Chief Complaint   Patient presents with    Routine Follow-up       Oncology/Treatment History:     Hematology/Oncology History   CLL (chronic lymphocytic leukemia) (CMS-HCC)   05/21/2021 Initial Diagnosis    CLL (chronic lymphocytic leukemia) (CMS-HCC)  -While he was officially diagnosed in 2022, he had a mild lymphocytosis dating back to 2017, when he was undergoing radiation for his prostate cancer.  On 02/08/2016, he had a flow cytometry, which showed a small population of monoclonal B-cells that were CD5-/CD10- with a total of 2.9k/uL clonal cells.  Observation was recommended.   -further evaluation of asymptomatic lymphocytosis ensued 05/21/2021.  -WBC 20.1, Hgb 12.5, Plt 238, ALC 13.8, ANC 3.96, B2M Not done, LDH 116 (ULN 192)  -Flow showed monoclonal B-cell population consistent with CLL with absolute clonal count of 3.74 k/UL, however a separate population of CD5-/CD10- monoclonal B-cells was detected  -Rai Stage: 1  -IGHV: Unmutated  -FISH showed: Normal  -Karyotype showed: Not completed.  -Recommendation: observation       07/01/2021 Interval Scan(s)    PET scan with scattered LAD, max size 2.0cm, No hepatosplenomegaly.     07/05/2021 Biopsy    Bone marrow biopsy showed hypercellular bone marrow with involvement of a low-grade B-cell lymphoproliferative disorder.  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 Ki-67 stain is variable in the interfollicular areas, 10% to 30%.   There are two separate B cell clones, in roughly equal proportions:   1) Kappa light chain restricted (dim expression), CD20+ (dim), CD5+, CD23+, CD43+, FMC-   2) Lambda light chain restricted, CD20+, CD5-, CD23+, CD43-, FMC+(dim).      06/26/2022 Interval Scan(s)    PET scan with continued LAD with mild progression.  Largest node 2.4cm in mesentery.     07/23/2022 Molecular Markers    -FISH showed: Normal  -Karyotype showed: 46,XY,del(7)(q31q32)[14]/46,XY[6]   -TP53 mutation was detected c.375G>A      08/05/2022 Biopsy    Bone marrow biopsy:  -  Hypercellular bone marrow (90%) primarily involved by CD5+ B-cell lymphoma/leukemia, consistent with chronic lymphocytic leukemia, representing approximately 70% of marrow cellularity by immunohistochemistry (see Comment)  -  Clonal immunoglobulin heavy (IGH) and kappa light chain (IGK) gene rearrangements identified (see Comment)      09/03/2022 -  Targeted Therapy    Obinutuzumab + acalabrutinib     02/24/2023 Interval Scan(s)    Complete resolution of lymph nodes.  History Obtained From:   Patient and his significant other.    History of Present Illness:   JASHER KLYM is a 82 y.o. male with a history of multiple cancers, who presents with progressive lymphocytosis due to his lymphoproliferative disorder and consultation for treatment recommendations.  For detailed oncologic and treatment (if applicable) history, please see above.     Interim History   Since his last visit, Brendan Bishop is doing well.  Overall improved energy. He has AM sluggishness but he is not wanting to wear CPAP. Improves throughout day and is still working full time and works outdoors all weekend. Denies f/c/ns. Denies n/v/d. Mild lower abd pain at times, sometimes looser stools based on what he eats. No blood in stool or urine.    He notices some inc work of breathing in last several months. He states this is occasional and isolated to when he takes a shower or a bath. No dizziness, CP.    He does have some mild bruising to his hands.    He denies new constitutional symptoms such as anorexia, weight loss, night sweats or unexplained fevers.  Furthermore, he denies symptoms of marrow failure: unexplained bleeding, recurrent or unexplained intercurrent infections, dyspnea on exertion, lightheadedness, palpitations or chest pain.  There have been no new or unexplained pains or self-identified masses, swelling or enlarged lymph nodes.     Review of Systems   Complete ROS performed and was negative except what is noted in interim history above.    Past Medical History     Past Medical History:   Diagnosis Date    Bladder cancer (CMS-HCC) 09/13/2021    CAD (coronary artery disease)     CLL (chronic lymphocytic leukemia) (CMS-HCC)     GERD (gastroesophageal reflux disease)     Hyperlipidemia     Hypertension     Marginal zone B-cell lymphoma (CMS-HCC)     Multinodular goiter     OSA (obstructive sleep apnea)     Prostate cancer (CMS-HCC) 10/2015    Stroke (CMS-HCC) 2011       Past Surgical History:     Past Surgical History:   Procedure Laterality Date    BIOPSY OF THYROID [facility] HISTORICAL RESULT)      BONE MARROW BIOPSY      CARPAL TUNNEL RELEASE      CATARACT EXTRACTION Bilateral     CORONARY ARTERY BYPASS GRAFT  2005    HEMORRHOID SURGERY  2002    LYMPH NODE BIOPSY  07/08/2021    PAROTIDECTOMY Left 08/04/2017    TONSILLECTOMY AND ADENOIDECTOMY      TOTAL KNEE ARTHROPLASTY Left 2014    TOTAL KNEE ARTHROPLASTY Right 05/17/2018    TRANSURETHRAL RESECTION OF BLADDER TUMOR  09/13/2021       Allergies:     No Known Allergies      Family History:     Family History   Problem Relation Age of Onset    Heart disease Mother     Parkinsonism Father     Alzheimer's disease Father     Diabetes Paternal Grandfather        Social History:     Social History     Tobacco Use    Smoking status: Never    Smokeless tobacco: Never   Vaping Use    Vaping status: Never Used   Substance Use Topics    Alcohol use: Not Currently    Drug use: Never       Medications:  Current Outpatient Medications   Medication Sig Dispense Refill    acalabrutinib (CALQUENCE) 100 mg tablet Take 1 tablet (100 mg total) by mouth two (2) times a day. Swallow whole with water. Do not to chew, crush, dissolve, or cut tablets. 60 tablet 11    acetaminophen (TYLENOL) 325 MG tablet Take 1.5 tablets (487.5 mg total) by mouth nightly. 100 tablet 2    allopurinol (ZYLOPRIM) 300 MG tablet Take 1 tablet (300 mg total) by mouth daily. 30 tablet 1    ascorbic acid, vitamin C, (VITAMIN C) 1000 MG tablet Take 1 tablet (1,000 mg total) by mouth.      aspirin (ECOTRIN) 81 MG tablet Take 1 tablet (81 mg total) by mouth.      atorvastatin (LIPITOR) 80 MG tablet       b complex vitamins (SUPER B-50 COMPLEX) capsule Take 1 capsule by mouth daily. 30 capsule 11    bimatoprost (LUMIGAN) 0.01 % Drop Administer 1 drop to both eyes nightly. 30 mL 0    cholecalciferol, vitamin D3-250 mcg, 10,000 unit,, 250 mcg (10,000 unit) capsule Take 0.5 capsules (125 mcg total) by mouth daily. 120 capsule 2    coenzyme Q10 200 mg capsule Take 1 capsule (200 mg total) by mouth.      cyanocobalamin, vitamin B-12, 1000 MCG tablet Take 0.5 tablets (500 mcg total) by mouth daily. 30 tablet 0    ENTRESTO 24-26 mg tablet Take 1 tablet by mouth.      ferrous sulfate 325 (65 FE) MG tablet Take 1 tablet (325 mg total) by mouth every other day. 30 tablet 11    fish,bora,flax oils-om3,6,9no1 (OMEGA 3-6-9) 1,200 mg cap Take 1 tablet by mouth in the morning. 30 capsule 0    L.acidophil-L.plantar-Bifido 7 15 billion cell cap Take 5 mg by mouth in the morning. 30 capsule 0    lysine 1,000 mg Tab Take 1,000 mg by mouth.      magnesium gluconate (MAGONATE) 500 mg (27 mg elem magnesium) tablet Take 1 tablet by mouth daily. 30 tablet 0    midodrine (PROAMATINE) 5 MG tablet Take 1 tablet (5 mg total) by mouth Three (3) times a day.      mv-min-FA-vit K-lutein-zeaxant (PRESERVISION AREDS 2 PLUS MV) 200 mcg-15 mcg- 5 mg-1 mg cap Take 1 tablet by mouth in the morning. 30 capsule 0    NON FORMULARY Astralagus, Black elderberry, reishii      ondansetron (ZOFRAN) 4 MG tablet Take 1 tablet (4 mg) by mouth once a day as needed for nausea. 30 tablet 0    selenium 200 mcg Tab Take 1 tablet (200 mcg total) by mouth.      spironolactone (ALDACTONE) 25 MG tablet Take 1 tablet (25 mg total) by mouth daily.      tamsulosin (FLOMAX) 0.4 mg capsule       traMADol (ULTRAM) 50 mg tablet TAKE 1 TO 2 TABLETS BY MOUTH THREE TIMES DAILY AS NEEDED FOR PAIN      triamcinolone (KENALOG) 0.1 % ointment Apply topically two (2) times a day. 30 g 0    valACYclovir (VALTREX) 500 MG tablet Take 1 tablet (500 mg total) by mouth daily. 30 tablet 5    zinc gluconate 50 mg (7 mg elemental zinc) tablet Take 1 tablet (50 mg total) by mouth daily.       No current facility-administered medications for this visit.       Physical Exam:     PHYSICAL EXAMINATION:  ECOG:  2   VITAL SIGNS:  BP 112/56  - Pulse 71  - Temp 36.1 ??C (97 ??F) (Temporal)  - Resp 18  - Wt 78.5 kg (173 lb 1 oz)  - SpO2 96%  - BMI 27.93 kg/m??     General: well appearing, in no acute distress  HEENT: Sclera anicteric. Conjunctivae not injected, no neck pain or rigidity noted.  Resp: Normal WOB, symmetric excursion, CTAB  CV: RRR without murmurs, rubs or gallops.    GI: Normal bowel sounds, abdomen soft, non-tender, non-distended  Skin:  Neuro: Alert and oriented to conversation, moves all extremities without asymmetry and with at least antigravity strength  Psych: Normal affect  Extremities: No clubbing or lower extremity edema appreciated.  LYMPH: No palpable cervical, supraclavicular, axillary, inguinal lymphadenopathy. Palpable cervical glands, not distinct LN.      Labs:   See oncology history      Pathology:   See oncology history    Imaging:   I personally reviewed all of the patient's pertinent images and documented any relevant findings in the assessment/plan.  See outside records.    See HPI

## 2023-05-27 ENCOUNTER — Ambulatory Visit: Admit: 2023-05-27 | Discharge: 2023-05-27 | Payer: MEDICARE

## 2023-05-27 ENCOUNTER — Other Ambulatory Visit: Admit: 2023-05-27 | Discharge: 2023-05-27 | Payer: MEDICARE

## 2023-05-27 DIAGNOSIS — C9110 Chronic lymphocytic leukemia of B-cell type not having achieved remission: Principal | ICD-10-CM

## 2023-05-27 LAB — COMPREHENSIVE METABOLIC PANEL
ALBUMIN: 3.7 g/dL (ref 3.4–5.0)
ALKALINE PHOSPHATASE: 75 U/L (ref 46–116)
ALT (SGPT): 23 U/L (ref 10–49)
ANION GAP: 9 mmol/L (ref 5–14)
AST (SGOT): 32 U/L (ref <=34)
BILIRUBIN TOTAL: 0.5 mg/dL (ref 0.3–1.2)
BLOOD UREA NITROGEN: 19 mg/dL (ref 9–23)
BUN / CREAT RATIO: 19
CALCIUM: 9.9 mg/dL (ref 8.7–10.4)
CHLORIDE: 103 mmol/L (ref 98–107)
CO2: 28.7 mmol/L (ref 20.0–31.0)
CREATININE: 1 mg/dL (ref 0.73–1.18)
EGFR CKD-EPI (2021) MALE: 75 mL/min/{1.73_m2} (ref >=60)
GLUCOSE RANDOM: 108 mg/dL (ref 70–179)
POTASSIUM: 4.5 mmol/L (ref 3.4–4.8)
PROTEIN TOTAL: 6.1 g/dL (ref 5.7–8.2)
SODIUM: 141 mmol/L (ref 135–145)

## 2023-05-27 LAB — CBC W/ AUTO DIFF
BASOPHILS ABSOLUTE COUNT: 0 10*9/L (ref 0.0–0.1)
BASOPHILS RELATIVE PERCENT: 0.3 %
EOSINOPHILS ABSOLUTE COUNT: 0.2 10*9/L (ref 0.0–0.5)
EOSINOPHILS RELATIVE PERCENT: 2 %
HEMATOCRIT: 36 % — ABNORMAL LOW (ref 39.0–48.0)
HEMOGLOBIN: 12.3 g/dL — ABNORMAL LOW (ref 12.9–16.5)
LYMPHOCYTES ABSOLUTE COUNT: 1.6 10*9/L (ref 1.1–3.6)
LYMPHOCYTES RELATIVE PERCENT: 19.4 %
MEAN CORPUSCULAR HEMOGLOBIN CONC: 34 g/dL (ref 32.0–36.0)
MEAN CORPUSCULAR HEMOGLOBIN: 32.8 pg — ABNORMAL HIGH (ref 25.9–32.4)
MEAN CORPUSCULAR VOLUME: 96.4 fL — ABNORMAL HIGH (ref 77.6–95.7)
MEAN PLATELET VOLUME: 8.8 fL (ref 6.8–10.7)
MONOCYTES ABSOLUTE COUNT: 0.9 10*9/L — ABNORMAL HIGH (ref 0.3–0.8)
MONOCYTES RELATIVE PERCENT: 10.8 %
NEUTROPHILS ABSOLUTE COUNT: 5.7 10*9/L (ref 1.8–7.8)
NEUTROPHILS RELATIVE PERCENT: 67.5 %
PLATELET COUNT: 169 10*9/L (ref 150–450)
RED BLOOD CELL COUNT: 3.73 10*12/L — ABNORMAL LOW (ref 4.26–5.60)
RED CELL DISTRIBUTION WIDTH: 13 % (ref 12.2–15.2)
WBC ADJUSTED: 8.5 10*9/L (ref 3.6–11.2)

## 2023-05-27 NOTE — Unmapped (Signed)
Patient Instructions:     You may begin taking an acid reducing medication, like prilosec or nexium once a day to see if this helps with the mild abd pain.    Medical Team    Physician: Lawson Radar, DO  Nurse Practitioner: Cruzita Lederer, FNP-BC  Pharmacists: Manfred Arch, PharmD, BCOP, CPP; Ronnald Collum, PharmD, BCOP  Nurse Navigator: Joaquin Bend, MSN, RN, [facility]    Regarding labs and other test results, please know that due to federal laws, all test results are now released and available for review on MyChart immediately upon becoming available.  This means that unlike before, you will now receive results before we can review them and attach comments and explanations. We will still attach those comments or call you as soon as we have all your results (for our standard blood tests, that usually takes 48-72 hours after they're drawn).  For some patients, seeing test results without our comments can cause anxiety about those results and even misunderstanding if a patient interprets them incorrectly.  We regret any such anxiety you may experience if you choose to review labs before we have commented. Please note, however, that to prevent anxiety, we recommend that you consider waiting to review your results until you receive an email that says we have attached our comments.  That makes sure that you receive our interpretation with your labs, which can help to avoid misunderstood results and perhaps undue anxiety.  Either way, please know we monitor your test results closely.    Labs:   Appointment on 05/27/2023   Component Date Value Ref Range Status    WBC 05/27/2023 8.5  3.6 - 11.2 10*9/L Final    RBC 05/27/2023 3.73 (L)  4.26 - 5.60 10*12/L Final    HGB 05/27/2023 12.3 (L)  12.9 - 16.5 g/dL Final    HCT 96/29/5284 36.0 (L)  39.0 - 48.0 % Final    MCV 05/27/2023 96.4 (H)  77.6 - 95.7 fL Final    MCH 05/27/2023 32.8 (H)  25.9 - 32.4 pg Final    MCHC 05/27/2023 34.0  32.0 - 36.0 g/dL Final    RDW 13/24/4010 13.0  12.2 - 15.2 % Final    MPV 05/27/2023 8.8  6.8 - 10.7 fL Final    Platelet 05/27/2023 169  150 - 450 10*9/L Final    Neutrophils % 05/27/2023 67.5  % Final    Lymphocytes % 05/27/2023 19.4  % Final    Monocytes % 05/27/2023 10.8  % Final    Eosinophils % 05/27/2023 2.0  % Final    Basophils % 05/27/2023 0.3  % Final    Absolute Neutrophils 05/27/2023 5.7  1.8 - 7.8 10*9/L Final    Absolute Lymphocytes 05/27/2023 1.6  1.1 - 3.6 10*9/L Final    Absolute Monocytes 05/27/2023 0.9 (H)  0.3 - 0.8 10*9/L Final    Absolute Eosinophils 05/27/2023 0.2  0.0 - 0.5 10*9/L Final    Absolute Basophils 05/27/2023 0.0  0.0 - 0.1 10*9/L Final       CONTACT INFORMATION     PHONE:     From Monday through Friday, 8am - 5pm, for all questions including appointments and clinical issues please call [phone] or toll-free [phone].  Please ask for nurse triage.     On Nights, Weekends and Holidays, for emergencies call [phone].    Please fax Insurance, Disability, or FMLA paperwork to [phone]. Please allow 1 week for completion of paperwork.      Leanora Ivanoff (messages  you want to send through the electronic health record):     1) Send messages to Dr. Zonia Kief as needed. Messages are reviewed by our nursing staff and handled appropriately.     2) *do not use this system to send urgent messages* (anything needing a response in less than 48 hours).  If you have an urgent need, please call the number above.     Questions About Your Visit?     If you have any questions about any information included in this visit summary, please contact your provider by sending a secure message using My [facility] or by phone at the number listed above. Bring this form with you to your next scheduled appointment as a reminder to discuss with your provider. Use this form to make notes about any medications, including over-the-counter medications, you have stopped or started taking, or any questions you have about any test or procedure results.    Medication Refills: please do not send urgent refill requests through MyChart.  Please call the numbers above.

## 2023-05-28 DIAGNOSIS — C9110 Chronic lymphocytic leukemia of B-cell type not having achieved remission: Principal | ICD-10-CM

## 2023-06-15 DIAGNOSIS — C9110 Chronic lymphocytic leukemia of B-cell type not having achieved remission: Principal | ICD-10-CM

## 2023-06-16 NOTE — Unmapped (Signed)
The [facility] has made a second and final attempt to reach this patient to refill the following medication:Calquence.      We have left voicemails on the following phone numbers: [phone], have been unable to leave messages on the following phone numbers: [phone], have sent a text message to the following phone numbers: [phone], and have sent a Mychart questionnaire..    Dates contacted: 12/18,23  Last scheduled delivery: 05/19/23    The patient may be at risk of non-compliance with this medication. The patient should call the [facility] at [phone]  Option 4, then Option 1: Oncology to refill medication.    [facility] Specialty Technician

## 2023-06-26 DIAGNOSIS — C9110 Chronic lymphocytic leukemia of B-cell type not having achieved remission: Principal | ICD-10-CM

## 2023-06-26 NOTE — Unmapped (Addendum)
**   Awaiting HCR and pt to call back with CC info [facility]      [facility] Refill Coordination Note    Brendan Bishop, DOB: 1941-03-19  Phone: [phone] (home) [phone] (work)      All above HIPAA information was verified with patient.         06/20/2023    12:43 AM   Specialty Rx Medication Refill Questionnaire   Which Medications would you like refilled and shipped? Calquence, Valtrex, [place] medication   If medication refills are not needed at this time, please indicate the reason below. N/A   Please list all current allergies: None   Have you missed any doses in the last 30 days? No   Have you had any changes to your medication(s) since your last refill? No   How many days remaining of each medication do you have at home? 4-5 days   Have you experienced any side effects in the last 30 days? No   Please enter the full address (street address, city, state, zip code) where you would like your medication(s) to be delivered to. [address] [place], [place], [place] [address] , (Office is open for delivery between 3-6 PM daily)   Please specify on which day you would like your medication(s) to arrive. Note: if you need your medication(s) within 3 days, please call the pharmacy to schedule your order at [phone]  06/26/2023   Has your insurance changed since your last refill? No   Would you like a pharmacist to call you to discuss your medication(s)? No   Do you require a signature for your package? (Note: if we are billing Medicare Part B or your order contains a controlled substance, we will require a signature) Yes   Additional Comments: Can you tell me the amount of the co-pay that I am expected to pay upon delivery? Thank you.         Completed refill call assessment today to schedule patient's medication shipment from the [facility] [phone]).  All relevant notes have been reviewed.       Confirmed patient received a Conservation officer, historic buildings and a Surveyor, mining with first shipment. The patient will receive a drug information handout for each medication shipped and additional FDA Medication Guides as required.         REFERRAL TO PHARMACIST     Referral to the pharmacist: Not needed      [facility]     Shipping address confirmed in Epic.     Delivery Scheduled: Yes, Expected medication delivery date: 06/26/23. **    Medication will be delivered via Same Day Courier to the prescription address in Epic [place].    Tobi Bastos, PharmD   [facility] Specialty Pharmacist

## 2023-06-29 DIAGNOSIS — C9110 Chronic lymphocytic leukemia of B-cell type not having achieved remission: Principal | ICD-10-CM

## 2023-07-01 MED FILL — CALQUENCE (ACALABRUTINIB MALEATE) 100 MG TABLET: ORAL | 30 days supply | Qty: 60 | Fill #9

## 2023-07-01 MED FILL — FEROSUL 325 MG (65 MG IRON) TABLET: ORAL | 90 days supply | Qty: 45 | Fill #2

## 2023-07-01 MED FILL — VALACYCLOVIR 500 MG TABLET: ORAL | 30 days supply | Qty: 30 | Fill #1

## 2023-07-01 NOTE — Unmapped (Signed)
Brendan Bishop 's CALQUENCE 100 mg tablet (acalabrutinib) shipment will be sent out as a result of copay is now approved by patient/caregiver.      I have spoken with the patient  at [phone]  and communicated the delivery change. We will reschedule the medication for the delivery date that the patient agreed upon.  We have confirmed the delivery date as 07/01/23 via same day courier.

## 2023-07-23 DIAGNOSIS — C9110 Chronic lymphocytic leukemia of B-cell type not having achieved remission: Principal | ICD-10-CM

## 2023-07-23 NOTE — Unmapped (Signed)
[facility] Delivery Pharm[facility] Refill Coordination Note    Brendan Bishop, DOB: [birth year]  Phone: [phone] (home) [phone] (work)      All above HIPAA information was verified with patient.         07/21/2023     5:35 PM   Specialty Rx Medication Refill Questionnaire   Which Medications would you like refilled and shipped? Calquence - I still have a two week supply left. Shipping & Delivery can be set on Monday, August 10, 2023 after 3:00 PM at [address] [place], [place], [place] [address]   If medication refills are not needed at this time, please indicate the reason below. N/A   Please list all current allergies: None   Have you missed any doses in the last 30 days? No   Have you had any changes to your medication(s) since your last refill? No   How many days remaining of each medication do you have at home? Two weeks   Have you experienced any side effects in the last 30 days? No   Please enter the full address (street address, city, state, zip code) where you would like your medication(s) to be delivered to. [address] [place], [place], [place] [address] after 3:00 PM   Please specify on which day you would like your medication(s) to arrive. Note: if you need your medication(s) within 3 days, please call the pharmacy to schedule your order at [phone]  08/10/2023   Has your insurance changed since your last refill? No   Would you like a pharmacist to call you to discuss your medication(s)? No   Do you require a signature for your package? (Note: if we are billing Medicare Part B or your order contains a controlled substance, we will require a signature) No         Completed refill call assessment today to schedule patient's medication shipment from the [facility] [phone]).  All relevant notes have been reviewed.       Confirmed patient received a Conservation officer, historic buildings and a Surveyor, mining with first shipment. The patient will receive a drug information handout for each medication shipped and additional FDA Medication Guides as required.         REFERRAL TO PHARMACIST     Referral to the pharmacist: Not needed      [facility]     Shipping address confirmed in Epic.     Delivery Scheduled: Yes, Expected medication delivery date: 08/10/23.     Medication will be delivered via Same Day Courier to the prescription address in Epic [place].    Brendan Bishop   [facility] Specialty Technician

## 2023-07-27 DIAGNOSIS — C9110 Chronic lymphocytic leukemia of B-cell type not having achieved remission: Principal | ICD-10-CM

## 2023-07-31 NOTE — Unmapped (Signed)
Brendan Bishop 's Calquence shipment will be rescheduled as a result of patient miscounted on hand supply and requests earlier delivery    I have spoken with the patient  via MyChart message and communicated the delivery change. We will reschedule the medication for the delivery date that the patient agreed upon.  We have confirmed the delivery date as 08/03/23, via same day courier.     Horace Porteous, PharmD  [facility]

## 2023-08-03 DIAGNOSIS — C9110 Chronic lymphocytic leukemia of B-cell type not having achieved remission: Principal | ICD-10-CM

## 2023-08-03 MED ORDER — ACALABRUTINIB MALEATE 100 MG TABLET
ORAL_TABLET | Freq: Two times a day (BID) | ORAL | 11 refills | 30.00 days | Status: CP
Start: 2023-08-03 — End: ?
  Filled 2023-08-03: qty 60, 30d supply, fill #0

## 2023-08-03 MED ORDER — CALQUENCE (ACALABRUTINIB MALEATE) 100 MG TABLET
ORAL_TABLET | Freq: Two times a day (BID) | ORAL | 11 refills | 30.00 days | Status: CN
Start: 2023-08-03 — End: ?

## 2023-08-03 MED FILL — VALACYCLOVIR 500 MG TABLET: ORAL | 30 days supply | Qty: 30 | Fill #2

## 2023-08-21 DIAGNOSIS — C9110 Chronic lymphocytic leukemia of B-cell type not having achieved remission: Principal | ICD-10-CM

## 2023-08-31 DIAGNOSIS — C9110 Chronic lymphocytic leukemia of B-cell type not having achieved remission: Principal | ICD-10-CM

## 2023-08-31 NOTE — Unmapped (Signed)
 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 male with a history of multiple cancers, who presents for follow-up of biphenotypic LPD.    #Biphenotypic B-cell lymphoproliferative disorder  -He has 2 distinct abnormal monoclonal B-cell populations.  One is classic for CLL with CD5+ and dim CD20 and kappa restricted.  The other is more consistent with marginal zone lymphoma which is CD5-/CD10-/CD20+ and lambda restricted.  As all pathology has been equal distribution of both cell types, it is unclear which or if both are contributing equally to the progressive lymphocytosis.  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  -Due to rapidly progressive lymphocytosis and progressive lymphadenopathy and anemia, he meets criteria for treatment.  -Interestingly, his peripheral blood flow from 07/23/22 showed 60% of the CD5- population and 10% of the CD5+ population, while the bone marrow biopsy from 08/06/22 demonstrated 90% of the CD5+ (although both populations were detected by IGH sequencing)  -Previously discussed potential treatment options, but the best option that would be effective against both clones is the combination of obinutuzumab and acalabrutinib as given for frontline CLL therapy in the E[facility]ATE [place] study. Obinutuzumab would be given for 6 months and acalabrutinib would be given continuously.  -Potential side effects include but are not limited to infusion reaction, infection, neutropenia, diarrhea, nausea, heartburn, joint or muscle aches, bleeding, bruising, atrial fibrillation, hypertension, and headache.  -He initiated therapy with obinutuzumab and acalabrutinib 09/03/22, he has completed 6 months of obi and is now on continuous acalabruitinib.  - 02/24/23 CT c/a/p shows complete resolution of all lymphadenopathy consistent with a CR by CT criteria.  Since the plan will be for continuous acalabrutinib, will not complete a bone marrow biopsy to confirm CR.  -having some increased joint pain since starting acala, will discuss dose reduction with Dr Zonia Kief.  -Clinical follow-up to 3 months.    #Tick Bite, resolved Pt with multiple tick bites in early June, one of which was buried in skin for 3-4 days. Tick was removed in entirety and since removal patient with increased fatigue. No fevers, chills, HA, photosensitivity, neck pain.  - 11/26/22 Lyme, Ehrlichia, RMSF PCR negative  - Empirically treated with doxycycline x 7 days- patient did not complete the course  - Offered referral to derm but he declines at this time.    #LLE pain, resolved: Noted at last visit: 2 day history pain to the L posterior knee after sitting. Pain remains when on stairs and getting and out of truck  - PVL to r/o DVT, negative    Possible TIA: Patient had visit with neurology late July with subtle new L facial droop. MRI 01/23/23 shows no evidence of stroke. Denies other deficits/weakness. Denies HA.  - Continue following with neurology    Mild lower abd pain, improved   - continue OTC  PPI therapy    Inc WOB, no complaints of this today, 09/02/23  -this occurs in the shower occasionally. Discussed drinking more fluids prior to shower as he may be having lower BP while hot shower and bath.                                                  #Anemia, mild, improved  -Likely related to B-cell malignancy as above.  -Also iron deficient.  Recommended increase iron supplementation to ferrous sulfate 325mg  every other day.  Recheck 09/03/22 with continued low Tsat at 14% and Ferritin at 40.  Hgb down to 10.  He received IV iron on 10/03/22.  There is mild improvement in Fe panel. We will continue with PO supplementation.   -He had a colonoscopy in 2022 and said that he had polyps and would have to return in 2027 for next one.  He had consultation visit on 6/13 with Dr. Adela Lank at [facility] in [place] for EGD/colonoscopy, determined that he is not a candidate for colonoscopy d/t age and comorbidities.  -Also of note, he previously had a bladder tumor that was causing bleeding.  He underwent repeat cystoscopy on 10/22/22 with Dr. Legrand Rams.  No evidence of recurrent disease.  He has requested repeat cystoscopy in 9 months, which should be ~2/25.  -We have reviewed with him that acalabrutinib can worsen bleeding and if he notices any new bleeding, he knows to contact us immediately.    #Dysphagia to solids   -Previously needed an esophageal dilation.  -EGD canceled by local GI given high risk deemed by Cardiologist  -Patient does not have bothersome dysphagia at this time so I think it is okay to defer for now. Patient also prefers to avoid scope.    #Rash, resolved  Eruption involving left > right side of back and right leg. No clear vesicles were noted, and the rash crosses the midline. Will prescribe topical steroid for possible drug-related eruption. Advised patient to alert [place] to worsening or evolution of rash. Mild ecchymotic appearance to R anterior shin, no distinct raised features.    #Falls:  - Fall on deck last week, did not hit head/LOC. No bone/joint pain.  - Falls typically 1 times per month. Notes poor balance s/p CVA in 2011. Did to PT for a short time 3 years ago but does not feel it was helpful nor does not have the time to continue.   - He declined PT and Dr Sudie Bailey at this time given time commitment required    #Skin excoriation left temple.  -Recommended dermatology evaluation as this is suspicious for SCC.  He has a Ship broker who he will contact. He was seen by local Derm on 02/21/23 and had skin lesion froze off  [facility] internal referral placed however patient is not able to make it to these appts on Tuesdays.    #Multiple medical comorbidities.  -Managed by PCP and other specialists.      #. Preventive  Cancer screening:  Patients with CLL/SLL are at increased risk of developing secondary malignancies.  Patient is not currently due for screening.  Annual dermatology evaluation, due to increased risk of non-melanomatous skin cancers.   If needed, blood products for transfusion should be irradiated.  Risk of Hypogammaglobulinemia: Baseline IgG levels 03/11/19 = 698, 07/23/22 1095  Vaccination  Avoid all live vaccines.  Recommend annual influenza vaccine.  Last given today, 09/02/23  Recommend COVID vaccine series and booster.  Last given:  04/13/20  Recommend pneumococcal vaccine with Prevnar 20.  Will need to receive this 6 months after completion of obinutuzumab ~2/25  Recommend RSV vaccine. Last given:  07/18/22  Recommend Zoster vaccine recombinant, adjuvanted (Shingrix).  Last given: 01/18/13 (chart lists as zostavax, will need to confirm)     -RTC in 3 months.      In total I spent 60 minutes in this visit which consisted of collecting a history and making medical management decisions for the diagnoses listed in my note, as well as reviewing and updating the patient's medical record, coordinating care, and documenting before, during and after the visit.    Cruzita Lederer, MSN, FNP-BC, [facility]  Nurse Practitioner  [facility] Hematology/Oncology                   Chief Complaint:     Chief Complaint   Patient presents with    Routine Follow-up       Oncology/Treatment History:     Hematology/Oncology History   CLL (chronic lymphocytic leukemia) (CMS-HCC)   05/21/2021 Initial Diagnosis    CLL (chronic lymphocytic leukemia) (CMS-HCC)  -While he was officially diagnosed in 2022, he had a mild lymphocytosis dating back to 2017, when he was undergoing radiation for his prostate cancer.  On 02/08/2016, he had a flow cytometry, which showed a small population of monoclonal B-cells that were CD5-/CD10- with a total of 2.9k/uL clonal cells.  Observation was recommended.   -further evaluation of asymptomatic lymphocytosis ensued 05/21/2021.  -WBC 20.1, Hgb 12.5, Plt 238, ALC 13.8, ANC 3.96, B2M Not done, LDH 116 (ULN 192)  -Flow showed monoclonal B-cell population consistent with CLL with absolute clonal count of 3.74 k/UL, however a separate population of CD5-/CD10- monoclonal B-cells was detected  -Rai Stage: 1  -IGHV: Unmutated  -FISH showed: Normal  -Karyotype showed: Not completed.  -Recommendation: observation       07/01/2021 Interval Scan(s)    PET scan with scattered LAD, max size 2.0cm, No hepatosplenomegaly.     07/05/2021 Biopsy    Bone marrow biopsy showed hypercellular bone marrow with involvement of a low-grade B-cell lymphoproliferative disorder.  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 Ki-67 stain is variable in the interfollicular areas, 10% to 30%.   There are two separate B cell clones, in roughly equal proportions:   1) Kappa light chain restricted (dim expression), CD20+ (dim), CD5+, CD23+, CD43+, FMC-   2) Lambda light chain restricted, CD20+, CD5-, CD23+, CD43-, FMC+(dim).      06/26/2022 Interval Scan(s)    PET scan with continued LAD with mild progression.  Largest node 2.4cm in mesentery.     07/23/2022 Molecular Markers    -FISH showed: Normal  -Karyotype showed: 46,XY,del(7)(q31q32)[14]/46,XY[6]   -TP53 mutation was detected c.375G>A      08/05/2022 Biopsy    Bone marrow biopsy:  -  Hypercellular bone marrow (90%) primarily involved by CD5+ B-cell lymphoma/leukemia, consistent with chronic lymphocytic leukemia, representing approximately 70% of marrow cellularity by immunohistochemistry (see Comment)  -  Clonal immunoglobulin heavy (IGH) and kappa light chain (IGK) gene rearrangements identified (see Comment)      09/03/2022 -  Targeted Therapy    Obinutuzumab + acalabrutinib     02/24/2023 Interval  Scan(s)    Complete resolution of lymph nodes.           History Obtained From:   Patient and his significant other.    History of Present Illness:   CORDAY WYKA is a 83 y.o. male with a history of multiple cancers, who presents with progressive lymphocytosis due to his lymphoproliferative disorder and consultation for treatment recommendations.  For detailed oncologic and treatment (if applicable) history, please see above.     Interim History   Since his last visit, Mr. Mogan is doing well.    History of Present Illness    GAILEN VENNE is an 83 year old male with chronic lymphocytic leukemia who presents for follow-up. He is accompanied by his partner.    He has been on Calquence for approximately one year for chronic lymphocytic leukemia (CLL). He experiences joint pain, which he believes may have worsened since starting Calquence. 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 No fevers, chills, or night sweats. Bruising on his hands is noted, but no active bleeding.    Spironolactone was discontinued two weeks ago due to low blood pressure readings. His caregiver reports stable weight and blood pressure since 2020.    He has glaucoma and macular degeneration, with recent vision loss in his left eye. He is scheduled to see a specialist at the eye center.    He reports frequent nighttime urination, which may be related to prostate issues. He is scheduled to see a urologist in May. He experiences fatigue and difficulty sleeping, waking every two hours to urinate.    He has a rash on his back that appears seasonally, typically in winter, and resembles a heat rash. He denies any itching or changes in detergent use.    His current medications include Calquence, zinc, folic acid, vitamin B12, and milk thistle. He also takes an herbal supplement called [employer] for immune support. We discussed the exact interactions between the supplements and calquence is not known however he feels well on them and prefers to continue.    He denies new constitutional symptoms such as anorexia, weight loss, night sweats or unexplained fevers.  Furthermore, he denies symptoms of marrow failure: unexplained bleeding, recurrent or unexplained intercurrent infections, dyspnea on exertion, lightheadedness, palpitations or chest pain.  There have been no new or unexplained pains or self-identified masses, swelling or enlarged lymph nodes.     Review of Systems   Complete ROS performed and was negative except what is noted in interim history above.    Past Medical History     Past Medical History:   Diagnosis Date    Bladder cancer (CMS-HCC) 09/13/2021    CAD (coronary artery disease)     CLL (chronic lymphocytic leukemia) (CMS-HCC)     GERD (gastroesophageal reflux disease)     Hyperlipidemia     Hypertension     Marginal zone B-cell lymphoma (CMS-HCC)     Multinodular goiter     OSA (obstructive sleep apnea)     Prostate cancer (CMS-HCC) 10/2015    Stroke (CMS-HCC) 2011       Past Surgical History:     Past Surgical History:   Procedure Laterality Date    BIOPSY OF THYROID [facility] HISTORICAL RESULT)      BONE MARROW BIOPSY      CARPAL TUNNEL RELEASE      CATARACT EXTRACTION Bilateral     CORONARY  ARTERY BYPASS GRAFT  2005    HEMORRHOID SURGERY  2002    LYMPH NODE BIOPSY  07/08/2021    PAROTIDECTOMY Left 08/04/2017    TONSILLECTOMY AND ADENOIDECTOMY      TOTAL KNEE ARTHROPLASTY Left 2014    TOTAL KNEE ARTHROPLASTY Right 05/17/2018    TRANSURETHRAL RESECTION OF BLADDER TUMOR  09/13/2021       Allergies:     No Known Allergies      Family History:     Family History   Problem Relation Age of Onset    Heart disease Mother     Parkinsonism Father     Alzheimer's disease Father     Diabetes Paternal Grandfather        Social History:     Social History     Tobacco Use    Smoking status: Never    Smokeless tobacco: Never   Vaping Use    Vaping status: Never Used   Substance Use Topics    Alcohol use: Not Currently    Drug use: Never       Medications:     Current Outpatient Medications   Medication Sig Dispense Refill    acalabrutinib (CALQUENCE) 100 mg tablet Take 1 tablet (100 mg total) by mouth two (2) times a day. Swallow whole with water. Do not to chew, crush, dissolve, or cut tablets. 60 tablet 11    acetaminophen (TYLENOL) 325 MG tablet Take 1.5 tablets (487.5 mg total) by mouth nightly. 100 tablet 2    ascorbic acid, vitamin C, (VITAMIN C) 1000 MG tablet Take 1 tablet (1,000 mg total) by mouth.      aspirin (ECOTRIN) 81 MG tablet Take 1 tablet (81 mg total) by mouth.      atorvastatin (LIPITOR) 80 MG tablet       b complex vitamins (SUPER B-50 COMPLEX) capsule Take 1 capsule by mouth daily. 30 capsule 11    bimatoprost (LUMIGAN) 0.01 % Drop Administer 1 drop to both eyes nightly. 30 mL 0    cholecalciferol, vitamin D3-250 mcg, 10,000 unit,, 250 mcg (10,000 unit) capsule Take 0.5 capsules (125 mcg total) by mouth daily. 120 capsule 2    coenzyme Q10 200 mg capsule Take 1 capsule (200 mg total) by mouth.      cyanocobalamin, vitamin B-12, 1000 MCG tablet Take 0.5 tablets (500 mcg total) by mouth daily. 30 tablet 0    ENTRESTO 24-26 mg tablet Take 1 tablet by mouth.      ferrous sulfate 325 (65 FE) MG tablet Take 1 tablet (325 mg total) by mouth every other day. 30 tablet 11    fish,bora,flax oils-om3,6,9no1 (OMEGA 3-6-9) 1,200 mg cap Take 1 tablet by mouth in the morning. 30 capsule 0    L.acidophil-L.plantar-Bifido 7 15 billion cell cap Take 5 mg by mouth in the morning. 30 capsule 0    lysine 1,000 mg Tab Take 1,000 mg by mouth.      magnesium gluconate (MAGONATE) 500 mg (27 mg elem magnesium) tablet Take 1 tablet by mouth daily. 30 tablet 0    midodrine (PROAMATINE) 5 MG tablet Take 1 tablet (5 mg total) by mouth Three (3) times a day.      mv-min-FA-vit K-lutein-zeaxant (PRESERVISION AREDS 2 PLUS MV) 200 mcg-15 mcg- 5 mg-1 mg cap Take 1 tablet by mouth in the morning. 30 capsule 0    NON FORMULARY Astralagus, Black elderberry, reishii      ondansetron (ZOFRAN) 4 MG tablet Take 1 tablet (  4 mg) by mouth once a day as needed for nausea. 30 tablet 0    selenium 200 mcg Tab Take 1 tablet (200 mcg total) by mouth.      tamsulosin (FLOMAX) 0.4 mg capsule       traMADol (ULTRAM) 50 mg tablet TAKE 1 TO 2 TABLETS BY MOUTH THREE TIMES DAILY AS NEEDED FOR PAIN      triamcinolone (KENALOG) 0.1 % ointment Apply topically two (2) times a day. 30 g 0    valACYclovir (VALTREX) 500 MG tablet Take 1 tablet (500 mg total) by mouth daily. 30 tablet 5    zinc gluconate 50 mg (7 mg elemental zinc) tablet Take 1 tablet (50 mg total) by mouth daily.      allopurinol (ZYLOPRIM) 300 MG tablet Take 1 tablet (300 mg total) by mouth daily. 30 tablet 1     Current Facility-Administered Medications   Medication Dose Route Frequency Provider Last Rate Last Admin    flu vaccine [id number] (55YR UP)(PF)(FLUZONE HIGH DOSE) ADS Med                Physical Exam:     PHYSICAL EXAMINATION:  ECOG: 2   VITAL SIGNS:  BP 107/52  - Pulse 71  - Temp 36.7 ??C (98 ??F) (Temporal)  - Resp 18  - Wt 77 kg (169 lb 12.1 oz)  - SpO2 95%  - BMI 27.40 kg/m??     General: well appearing, in no acute distress  HEENT: Sclera anicteric. Conjunctivae not injected, no neck pain or rigidity noted.  Resp: Normal WOB, symmetric excursion, CTAB  CV: RRR without murmurs, rubs or gallops.    GI: Normal bowel sounds, abdomen soft, non-tender, non-distended  Skin: fine, thin lacey flesh colored papular patches to back and torso.   Neuro: Alert and oriented to conversation, moves all extremities without asymmetry and with at least antigravity strength  Psych: Normal affect  Extremities: No clubbing or lower extremity edema appreciated.  LYMPH: No palpable cervical, supraclavicular, axillary, inguinal lymphadenopathy. Palpable cervical glands, not distinct LN.      Labs:   See oncology history      Pathology:   See oncology history    Imaging:   I personally reviewed all of the patient's pertinent images and documented any relevant findings in the assessment/plan.  See outside records.    See HPI

## 2023-09-01 DIAGNOSIS — C9110 Chronic lymphocytic leukemia of B-cell type not having achieved remission: Principal | ICD-10-CM

## 2023-09-02 ENCOUNTER — Other Ambulatory Visit: Admit: 2023-09-02 | Discharge: 2023-09-03 | Payer: MEDICARE

## 2023-09-02 ENCOUNTER — Ambulatory Visit: Admit: 2023-09-02 | Discharge: 2023-09-03 | Payer: MEDICARE

## 2023-09-02 DIAGNOSIS — C9110 Chronic lymphocytic leukemia of B-cell type not having achieved remission: Principal | ICD-10-CM

## 2023-09-02 LAB — CBC W/ AUTO DIFF
BASOPHILS ABSOLUTE COUNT: 0 10*9/L (ref 0.0–0.1)
BASOPHILS RELATIVE PERCENT: 0.5 %
EOSINOPHILS ABSOLUTE COUNT: 0.2 10*9/L (ref 0.0–0.5)
EOSINOPHILS RELATIVE PERCENT: 2.5 %
HEMATOCRIT: 37.4 % — ABNORMAL LOW (ref 39.0–48.0)
HEMOGLOBIN: 12.9 g/dL (ref 12.9–16.5)
LYMPHOCYTES ABSOLUTE COUNT: 2 10*9/L (ref 1.1–3.6)
LYMPHOCYTES RELATIVE PERCENT: 28.7 %
MEAN CORPUSCULAR HEMOGLOBIN CONC: 34.4 g/dL (ref 32.0–36.0)
MEAN CORPUSCULAR HEMOGLOBIN: 32.7 pg — ABNORMAL HIGH (ref 25.9–32.4)
MEAN CORPUSCULAR VOLUME: 94.8 fL (ref 77.6–95.7)
MEAN PLATELET VOLUME: 9.2 fL (ref 6.8–10.7)
MONOCYTES ABSOLUTE COUNT: 0.8 10*9/L (ref 0.3–0.8)
MONOCYTES RELATIVE PERCENT: 10.7 %
NEUTROPHILS ABSOLUTE COUNT: 4 10*9/L (ref 1.8–7.8)
NEUTROPHILS RELATIVE PERCENT: 57.6 %
PLATELET COUNT: 159 10*9/L (ref 150–450)
RED BLOOD CELL COUNT: 3.94 10*12/L — ABNORMAL LOW (ref 4.26–5.60)
RED CELL DISTRIBUTION WIDTH: 13.2 % (ref 12.2–15.2)
WBC ADJUSTED: 7 10*9/L (ref 3.6–11.2)

## 2023-09-02 LAB — COMPREHENSIVE METABOLIC PANEL
ALBUMIN: 3.8 g/dL (ref 3.4–5.0)
ALKALINE PHOSPHATASE: 55 U/L (ref 46–116)
ALT (SGPT): 18 U/L (ref 10–49)
ANION GAP: 9 mmol/L (ref 5–14)
AST (SGOT): 25 U/L (ref <=34)
BILIRUBIN TOTAL: 0.8 mg/dL (ref 0.3–1.2)
BLOOD UREA NITROGEN: 22 mg/dL (ref 9–23)
BUN / CREAT RATIO: 20
CALCIUM: 10.3 mg/dL (ref 8.7–10.4)
CHLORIDE: 106 mmol/L (ref 98–107)
CO2: 25 mmol/L (ref 20.0–31.0)
CREATININE: 1.1 mg/dL (ref 0.73–1.18)
EGFR CKD-EPI (2021) MALE: 67 mL/min/{1.73_m2} (ref >=60)
GLUCOSE RANDOM: 102 mg/dL (ref 70–179)
POTASSIUM: 4.7 mmol/L (ref 3.4–4.8)
PROTEIN TOTAL: 6 g/dL (ref 5.7–8.2)
SODIUM: 140 mmol/L (ref 135–145)

## 2023-09-02 NOTE — Unmapped (Signed)
 Vaccine Given VIS in AVS

## 2023-09-02 NOTE — Unmapped (Signed)
 Patient Instructions:     Please call for refill on the calquence:  [phone] option#4.     Please call monthly for refill.     Medical Team    Physician: Lawson Radar, DO  Nurse Practitioner: Cruzita Lederer, FNP-BC  Pharmacists: Manfred Arch, PharmD, BCOP, CPP; Ronnald Collum, PharmD, BCOP  Nurse Navigator: Joaquin Bend, MSN, RN, [facility]    Regarding labs and other test results, please know that due to federal laws, all test results are now released and available for review on MyChart immediately upon becoming available.  This means that unlike before, you will now receive results before we can review them and attach comments and explanations. We will still attach those comments or call you as soon as we have all your results (for our standard blood tests, that usually takes 48-72 hours after they're drawn).  For some patients, seeing test results without our comments can cause anxiety about those results and even misunderstanding if a patient interprets them incorrectly.  We regret any such anxiety you may experience if you choose to review labs before we have commented. Please note, however, that to prevent anxiety, we recommend that you consider waiting to review your results until you receive an email that says we have attached our comments.  That makes sure that you receive our interpretation with your labs, which can help to avoid misunderstood results and perhaps undue anxiety.  Either way, please know we monitor your test results closely.    Labs:   Lab on 09/02/2023   Component Date Value Ref Range Status    Sodium 09/02/2023 140  135 - 145 mmol/L Final    Potassium 09/02/2023 4.7  3.4 - 4.8 mmol/L Final    Chloride 09/02/2023 106  98 - 107 mmol/L Final    CO2 09/02/2023 25.0  20.0 - 31.0 mmol/L Final    Anion Gap 09/02/2023 9  5 - 14 mmol/L Final    BUN 09/02/2023 22  9 - 23 mg/dL Final    Creatinine 09/81/1914 1.10  0.73 - 1.18 mg/dL Final    BUN/Creatinine Ratio 09/02/2023 20   Final    eGFR CKD-EPI (2021) Male 09/02/2023 67  >=60 mL/min/1.16m2 Final    eGFR calculated with CKD-EPI 2021 equation in accordance with [employer] and [employer] of Nephrology Task Force recommendations.    Glucose 09/02/2023 102  70 - 179 mg/dL Final    Calcium 78/29/5621 10.3  8.7 - 10.4 mg/dL Final    Albumin 30/86/5784 3.8  3.4 - 5.0 g/dL Final    Total Protein 09/02/2023 6.0  5.7 - 8.2 g/dL Final    Total Bilirubin 09/02/2023 0.8  0.3 - 1.2 mg/dL Final    AST 69/62/9528 25  <=34 U/L Final    ALT 09/02/2023 18  10 - 49 U/L Final    Alkaline Phosphatase 09/02/2023 55  46 - 116 U/L Final    WBC 09/02/2023 7.0  3.6 - 11.2 10*9/L Final    RBC 09/02/2023 3.94 (L)  4.26 - 5.60 10*12/L Final    HGB 09/02/2023 12.9  12.9 - 16.5 g/dL Final    HCT 41/32/4401 37.4 (L)  39.0 - 48.0 % Final    MCV 09/02/2023 94.8  77.6 - 95.7 fL Final    MCH 09/02/2023 32.7 (H)  25.9 - 32.4 pg Final    MCHC 09/02/2023 34.4  32.0 - 36.0 g/dL Final    RDW 02/72/5366 13.2  12.2 - 15.2 % Final  MPV 09/02/2023 9.2  6.8 - 10.7 fL Final    Platelet 09/02/2023 159  150 - 450 10*9/L Final    Neutrophils % 09/02/2023 57.6  % Final    Lymphocytes % 09/02/2023 28.7  % Final    Monocytes % 09/02/2023 10.7  % Final    Eosinophils % 09/02/2023 2.5  % Final    Basophils % 09/02/2023 0.5  % Final    Absolute Neutrophils 09/02/2023 4.0  1.8 - 7.8 10*9/L Final    Absolute Lymphocytes 09/02/2023 2.0  1.1 - 3.6 10*9/L Final    Absolute Monocytes 09/02/2023 0.8  0.3 - 0.8 10*9/L Final    Absolute Eosinophils 09/02/2023 0.2  0.0 - 0.5 10*9/L Final    Absolute Basophils 09/02/2023 0.0  0.0 - 0.1 10*9/L Final       CONTACT INFORMATION     PHONE:     From Monday through Friday, 8am - 5pm, for all questions including appointments and clinical issues please call [phone] or toll-free [phone].  Please ask for nurse triage.     On Nights, Weekends and Holidays, for emergencies call [phone].    Please fax Insurance, Disability, or FMLA paperwork to [phone]. Please allow 1 week for completion of paperwork.      Leanora Ivanoff (messages you want to send through the electronic health record):     1) Send messages to Dr. Zonia Kief as needed. Messages are reviewed by our nursing staff and handled appropriately.     2) *do not use this system to send urgent messages* (anything needing a response in less than 48 hours).  If you have an urgent need, please call the number above.     Questions About Your Visit?     If you have any questions about any information included in this visit summary, please contact your provider by sending a secure message using My [place] Chart or by phone at the number listed above. Bring this form with you to your next scheduled appointment as a reminder to discuss with your provider. Use this form to make notes about any medications, including over-the-counter medications, you have stopped or started taking, or any questions you have about any test or procedure results.    Medication Refills: please do not send urgent refill requests through MyChart.  Please call the numbers above.

## 2023-09-03 NOTE — Unmapped (Signed)
 Brendan Bishop, DOB: 1941-03-17  Phone: [phone] (home) [phone] (work)    All above HIPAA information was verified with patient.     Was a Nurse, learning disability used for this call? No    Specialty Medication(s):   Hematology/Oncology: Calquence     Current Outpatient Medications   Medication Sig Dispense Refill    acalabrutinib (CALQUENCE) 100 mg tablet Take 1 tablet (100 mg total) by mouth two (2) times a day. Swallow whole with water. Do not to chew, crush, dissolve, or cut tablets. 60 tablet 11    acetaminophen (TYLENOL) 325 MG tablet Take 1.5 tablets (487.5 mg total) by mouth nightly. 100 tablet 2    allopurinol (ZYLOPRIM) 300 MG tablet Take 1 tablet (300 mg total) by mouth daily. 30 tablet 1    ascorbic acid, vitamin C, (VITAMIN C) 1000 MG tablet Take 1 tablet (1,000 mg total) by mouth.      aspirin (ECOTRIN) 81 MG tablet Take 1 tablet (81 mg total) by mouth.      atorvastatin (LIPITOR) 80 MG tablet       b complex vitamins (SUPER B-50 COMPLEX) capsule Take 1 capsule by mouth daily. 30 capsule 11    bimatoprost (LUMIGAN) 0.01 % Drop Administer 1 drop to both eyes nightly. 30 mL 0    cholecalciferol, vitamin D3-250 mcg, 10,000 unit,, 250 mcg (10,000 unit) capsule Take 0.5 capsules (125 mcg total) by mouth daily. 120 capsule 2    coenzyme Q10 200 mg capsule Take 1 capsule (200 mg total) by mouth.      cyanocobalamin, vitamin B-12, 1000 MCG tablet Take 0.5 tablets (500 mcg total) by mouth daily. 30 tablet 0    ENTRESTO 24-26 mg tablet Take 1 tablet by mouth.      ferrous sulfate 325 (65 FE) MG tablet Take 1 tablet (325 mg total) by mouth every other day. 30 tablet 11    fish,bora,flax oils-om3,6,9no1 (OMEGA 3-6-9) 1,200 mg cap Take 1 tablet by mouth in the morning. 30 capsule 0    L.acidophil-L.plantar-Bifido 7 15 billion cell cap Take 5 mg by mouth in the morning. 30 capsule 0    lysine 1,000 mg Tab Take 1,000 mg by mouth.      magnesium gluconate (MAGONATE) 500 mg (27 mg elem magnesium) tablet Take 1 tablet by mouth daily. 30 tablet 0    midodrine (PROAMATINE) 5 MG tablet Take 1 tablet (5 mg total) by mouth Three (3) times a day.      mv-min-FA-vit K-lutein-zeaxant (PRESERVISION AREDS 2 PLUS MV) 200 mcg-15 mcg- 5 mg-1 mg cap Take 1 tablet by mouth in the morning. 30 capsule 0    NON FORMULARY Astralagus, Black elderberry, reishii      ondansetron (ZOFRAN) 4 MG tablet Take 1 tablet (4 mg) by mouth once a day as needed for nausea. 30 tablet 0    selenium 200 mcg Tab Take 1 tablet (200 mcg total) by mouth.      tamsulosin (FLOMAX) 0.4 mg capsule       traMADol (ULTRAM) 50 mg tablet TAKE 1 TO 2 TABLETS BY MOUTH THREE TIMES DAILY AS NEEDED FOR PAIN      triamcinolone (KENALOG) 0.1 % ointment Apply topically two (2) times a day. 30 g 0    valACYclovir (VALTREX) 500 MG tablet Take 1 tablet (500 mg total) by mouth daily. 30 tablet 5    zinc gluconate 50 mg (  7 mg elemental zinc) tablet Take 1 tablet (50 mg total) by mouth daily.       No current facility-administered medications for this visit.     Facility-Administered Medications Ordered in Other Visits   Medication Dose Route Frequency Provider Last Rate Last Admin    flu vaccine [id number] (57YR UP)(PF)(FLUZONE HIGH DOSE) ADS Med                 Changes to medications: [name] reports no changes at this time.    Medication list has been reviewed and updated in Epic: Yes    No Known Allergies    Changes to allergies: No    Allergies have been reviewed and updated in Epic: Yes    SPECIALTY MEDICATION ADHERENCE     Calquence 100 mg: 1 dose of medicine on hand (will run out after taking tonight's dose)    Medication Adherence    Patient reported X missed doses in the last month: 1  Specialty Medication: Calquence 100 mg twice daily  Patient is on additional specialty medications: No  Informant: patient  Confirmed plan for next specialty medication refill: delivery by pharmacy  Refills needed for supportive medications: not needed          Are there any concerns with adherence? No    Adherence counseling provided? Not needed    Patient-Reported Symptoms Tracker for Cancer Patients on Oral Chemotherapy     Oral chemotherapy medication name(s): Calquence  Dose and frequency: 100 mg twice daily  Oral Chemotherapy Start Date:    Baseline? No  Clinic(s) visited: Hematology    Symptom Grouping Question Patient Response   Digestion and Eating Have you felt sick to your stomach? Denies    Had diarrhea? Denies    Constipated? Denies    Not wanting to eat? Denies    Comments      Sleep and Pain Felt very tired even after you rest? Denies    Pain due to cancer medication or cancer? Denies    Comments     Other Side Effects Numbness or tingling in hands and/or feet? Denies    Felt short of breath? Denies    Mouth or throat Sores? Denies    Rash? Denies    Palmar-plantar erythrodysesthesia syndrome?      Rash - acneiform?      Rash - maculo-papular?      How many days over the past month did your cancer medication or cancer keep you from your normal activities?  Write in number of days, 0-30:  0    Other side effects or things you would like to discuss?      Comments?     Adherence  In the last 30 days, on how many days did you miss at least one dose of any of your [drug name]? Write in number of days, 0-30:  0    What reasons are you having trouble taking your medication [pharmacist: check all that apply]? Specify chemotherapy cycle:        No problems identified    Comments:        Comments       Optional Symptom Tracking Comments:      CLINICAL MANAGEMENT AND INTERVENTION      Clinical Benefit Assessment:    Do you feel the medicine is effective or helping your condition? Yes    Clinical Benefit counseling provided? Not needed    Acute Infection Status:    Acute infections noted within  Epic:  No active infections    Patient reported infection: None    Therapy Appropriateness:    Is therapy appropriate based on current medication list, adverse reactions, adherence, clinical benefit and progress toward achieving therapeutic goals?  Yes, therapy is appropriate and should be continued    DISEASE/MEDICATION-SPECIFIC INFORMATION      N/A    Is the patient receiving adequate infection prevention treatment? Not applicable    Does the patient have adequate nutritional support? Not applicable    PATIENT SPECIFIC NEEDS     Does the patient have any physical, cognitive, or cultural barriers? No    Is the patient high risk? No    Did the patient require a clinical intervention? No    Does the patient require physician intervention or other additional services (i.e., nutrition, smoking cessation, social work)? No    Does the patient have an additional or emergency contact listed in their chart? Yes    SOCIAL DETERMINANTS OF HEALTH     At the [facility], we have learned that life circumstances - like trouble affording food, housing, utilities, or transportation can affect the health of many of our patients.   That is why we wanted to ask: are you currently experiencing any life circumstances that are negatively impacting your health and/or quality of life? Patient declined to answer    Social Drivers of Health     Food Insecurity: Not on file   Tobacco Use: Medium Risk (08/24/2023)    Received from [facility]    Patient History     Smoking Tobacco Use: Former     Smokeless Tobacco Use: Never     Passive Exposure: Not on file   Transportation Needs: Not on file   Alcohol Use: Unknown (09/29/2019)    Received from [facility]    AUDIT-C     Frequency of Alcohol Consumption: 2-3 times a week     Average Number of Drinks: Not on file     Frequency of Binge Drinking: Not on file   Housing: Unknown (07/20/2023)    Received from [facility]    Housing Stability Vital Sign     Unable to Pay for Housing in the Last Year: Not on file     Number of Times Moved in the Last Year: Not on file     Homeless in the Last Year: No   Physical Activity: Not on file   Utilities: Not on file   Stress: Not on file   Interpersonal Safety: Not on file   Substance Use: Not on file (05/03/2023)   Intimate Partner Violence: Not on file   Social Connections: Not on file   Financial Resource Strain: Not on file   Depression: Not at risk (12/24/2022)    PHQ-2     PHQ-2 Score: 0   Internet Connectivity: Not on file   Health Literacy: Not on file       Would you be willing to receive help with any of the needs that you have identified today? Not applicable       SHIPPING     Specialty Medication(s) to be Shipped:   Hematology/Oncology: Calquence    Other medication(s) to be shipped:  valacyclovir     Changes to insurance: No    Delivery Scheduled: Yes, Expected medication delivery date: 09/04/23.     Medication will be delivered via Same Day Courier to the confirmed prescription address  in [place].    The patient will receive a drug information handout for each medication shipped and additional FDA Medication Guides as required.  Verified that patient has previously received a Conservation officer, historic buildings and a Surveyor, mining.    The patient or caregiver noted above participated in the development of this care plan and knows that they can request review of or adjustments to the care plan at any time.      All of the patient's questions and concerns have been addressed.    Kermit Balo, [facility]   [facility]  Pharmacist

## 2023-09-04 MED FILL — CALQUENCE (ACALABRUTINIB MALEATE) 100 MG TABLET: ORAL | 30 days supply | Qty: 60 | Fill #1

## 2023-09-04 MED FILL — VALACYCLOVIR 500 MG TABLET: ORAL | 90 days supply | Qty: 90 | Fill #3

## 2023-09-07 DIAGNOSIS — C9110 Chronic lymphocytic leukemia of B-cell type not having achieved remission: Principal | ICD-10-CM

## 2023-09-30 DIAGNOSIS — C9110 Chronic lymphocytic leukemia of B-cell type not having achieved remission: Principal | ICD-10-CM

## 2023-09-30 NOTE — Unmapped (Signed)
 [facility] Delivery [facility] Refill Coordination Note    Specialty Medication(s) to be Shipped:   Hematology/Oncology: Calquence    Other medication(s) to be shipped: No additional medications requested for fill at this time     Brendan Bishop, DOB: May 19, 1941  Phone: [phone] (home) [phone] (work)      All above HIPAA information was verified with patient.     Was a Nurse, learning disability used for this call? No    Completed refill call assessment today to schedule patient's medication shipment from the [facility]  [phone]).  All relevant notes have been reviewed.     Specialty medication(s) and dose(s) confirmed: Regimen is correct and unchanged.   Changes to medications: Brendan Bishop reports no changes at this time.  Changes to insurance: No  New side effects reported not previously addressed with a pharmacist or physician: None reported  Questions for the pharmacist: No    Confirmed patient received a Conservation officer, historic buildings and a Surveyor, mining with first shipment. The patient will receive a drug information handout for each medication shipped and additional FDA Medication Guides as required.       DISEASE/MEDICATION-SPECIFIC INFORMATION        N/A    SPECIALTY MEDICATION ADHERENCE     Medication Adherence    Patient reported X missed doses in the last month: 0  Specialty Medication: Calquence 100 mg  Patient is on additional specialty medications: No  Informant: patient     Were doses missed due to medication being on hold? No    Calquence 100 mg: 10 days of medicine on hand       REFERRAL TO PHARMACIST     Referral to the pharmacist: Not needed      [facility]     Shipping address confirmed in Epic.     Cost and Payment: Patient has a copay of $2.99. They are aware and have authorized the pharmacy to charge the credit card on file.    Delivery Scheduled: Yes, Expected medication delivery date: 10/07/23.     Medication will be delivered via Next Day Courier to the prescription address in Epic [place].    Brendan Bishop   [facility] Pharm[facility]  Specialty Technician

## 2023-10-01 NOTE — Telephone Encounter (Signed)
 Letter mailed to patient asking him to call us with an update on his treatment and if he is still interested in having ECL (at the hospital) with Dr. Adela Lank.  He needs to ask his [facility] oncology team about the timing of the procedures.

## 2023-10-06 DIAGNOSIS — C9110 Chronic lymphocytic leukemia of B-cell type not having achieved remission: Principal | ICD-10-CM

## 2023-10-06 MED FILL — FEROSUL 325 MG (65 MG IRON) TABLET: ORAL | 90 days supply | Qty: 45 | Fill #3

## 2023-10-06 MED FILL — CALQUENCE (ACALABRUTINIB MALEATE) 100 MG TABLET: ORAL | 30 days supply | Qty: 60 | Fill #2

## 2023-10-21 ENCOUNTER — Other Ambulatory Visit: Payer: Self-pay | Admitting: Urology

## 2023-10-21 ENCOUNTER — Encounter: Payer: Self-pay | Admitting: Urology

## 2023-11-03 ENCOUNTER — Encounter: Payer: Self-pay | Admitting: Urology

## 2023-11-03 ENCOUNTER — Ambulatory Visit: Admitting: Urology

## 2023-11-03 VITALS — BP 120/68 | Ht 66.0 in | Wt 165.0 lb

## 2023-11-03 DIAGNOSIS — N401 Enlarged prostate with lower urinary tract symptoms: Secondary | ICD-10-CM

## 2023-11-03 DIAGNOSIS — N138 Other obstructive and reflux uropathy: Secondary | ICD-10-CM

## 2023-11-03 DIAGNOSIS — C679 Malignant neoplasm of bladder, unspecified: Secondary | ICD-10-CM

## 2023-11-03 DIAGNOSIS — R350 Frequency of micturition: Secondary | ICD-10-CM

## 2023-11-03 DIAGNOSIS — Z8551 Personal history of malignant neoplasm of bladder: Secondary | ICD-10-CM

## 2023-11-03 DIAGNOSIS — R3912 Poor urinary stream: Secondary | ICD-10-CM | POA: Diagnosis not present

## 2023-11-03 MED ORDER — TAMSULOSIN HCL 0.4 MG PO CAPS: 0.4000 mg | ORAL_CAPSULE | Freq: Every day | ORAL | 11 refills | Status: AC

## 2023-11-03 MED ORDER — CEPHALEXIN 500 MG PO CAPS
500.0000 mg | ORAL_CAPSULE | Freq: Once | ORAL | Status: AC
  Administered 2023-11-03: 500 mg via ORAL

## 2023-11-03 MED ORDER — FINASTERIDE 5 MG PO TABS: 5.0000 mg | ORAL_TABLET | Freq: Every day | ORAL | 3 refills | Status: AC

## 2023-11-03 NOTE — Progress Notes (Signed)
 Cystoscopy Procedure Note:  Indication: History of low-grade noninvasive bladder cancer  83 year old male who presented with gross hematuria and was found to have a small bladder tumor.  Underwent uncomplicated bladder biopsy and fulguration of 2 cm lateral wall tumor on 09/13/2021 with postop gemcitabine .  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  Keflex  given for prophylaxis  After informed consent and discussion of the procedure and its risks, Wesley [name] was positioned and prepped in the standard fashion. Cystoscopy was performed with a flexible cystoscope. The urethra, bladder neck and entire bladder was visualized in a standard fashion. The prostate was moderate in size. The ureteral orifices were visualized in their normal location and orientation.  Bladder mucosa grossly normal throughout, no suspicious lesions, no abnormalities on retroflexion.  Findings: No evidence of recurrence  Assessment and Plan: RTC 1 yr cysto  He also reports worsening urinary symptoms with weak stream and nocturia, as well as frequency.  Currently just on Flomax  0.4 mg daily.  His primary issue is nocturia.  We discussed options like addition of finasteride , or consideration of outlet procedures like HOLEP.  With his age and other comorbidities including CLL he is not interested in surgical options, but was interested in adding finasteride .  We also reviewed nocturia strategies.  Continue Flomax  Start finasteride  RTC 1 year cystoscopy, PVR, sooner if problems  Jay Meth, MD 11/03/2023

## 2023-11-03 NOTE — Patient Instructions (Signed)

## 2023-11-03 NOTE — Addendum Note (Signed)
 Addended by: Alvia Tory M on: 11/03/2023 03:02 PM   Modules accepted: Orders

## 2023-11-04 DIAGNOSIS — C9110 Chronic lymphocytic leukemia of B-cell type not having achieved remission: Principal | ICD-10-CM

## 2023-11-04 NOTE — Unmapped (Signed)
 The [facility] has made a second and final attempt to reach this patient to refill the following medication:Calquence  100mg .      We have left voicemails on the following phone numbers: [phone], have been unable to leave messages on the following phone numbers: [phone], have sent a MyChart message, have sent a text message to the following phone numbers: [phone], and have sent a Mychart questionnaire..    Dates contacted: 5/5,14  Last scheduled delivery: 10/06/23    The patient may be at risk of non-compliance with this medication. The patient should call the [facility] at [phone]  Option 4, then Option 1: Oncology to refill medication.    [facility] Specialty Technician

## 2023-11-04 NOTE — Unmapped (Signed)
 [facility] Delivery Pharm[facility] Refill Coordination Note    Specialty Medication(s) to be Shipped:   Hematology/Oncology: Calquence     Other medication(s) to be shipped: No additional medications requested for fill at this time     Brendan Bishop, DOB: 09-12-1940  Phone: [phone] (home) [phone] (work)      All above HIPAA information was verified with patient.     Was a Nurse, learning disability used for this call? No    Completed refill call assessment today to schedule patient's medication shipment from the [facility]  [phone]).  All relevant notes have been reviewed.     Specialty medication(s) and dose(s) confirmed: Regimen is correct and unchanged.   Changes to medications: Brendan Bishop reports no changes at this time.  Changes to insurance: No  New side effects reported not previously addressed with a pharmacist or physician: None reported  Questions for the pharmacist: No    Confirmed patient received a Conservation officer, historic buildings and a Surveyor, mining with first shipment. The patient will receive a drug information handout for each medication shipped and additional FDA Medication Guides as required.       DISEASE/MEDICATION-SPECIFIC INFORMATION        N/A    SPECIALTY MEDICATION ADHERENCE     Medication Adherence    Patient reported X missed doses in the last month: 0  Specialty Medication: Calquence  100mg   Patient is on additional specialty medications: No  Patient is on more than two specialty medications: No  Informant: patient              Were doses missed due to medication being on hold? No    acalabrutinib : CALQUENCE  100 mg tablet  : 7 days of medicine on hand       REFERRAL TO PHARMACIST     Referral to the pharmacist: Not needed      [facility]     Shipping address confirmed in Epic.     Cost and Payment: Patient has a $0 copay, payment information is not required.    Delivery Scheduled: Yes, Expected medication delivery date: 11/06/23.     Medication will be delivered via Same Day Courier to the prescription address in Epic [place].    Brendan Bishop   [facility] Delivery Pharm[facility]  Specialty Technician

## 2023-11-06 MED FILL — CALQUENCE (ACALABRUTINIB MALEATE) 100 MG TABLET: ORAL | 30 days supply | Qty: 60 | Fill #3

## 2023-11-30 NOTE — Unmapped (Signed)
 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 male with a history of multiple cancers, who presents for follow-up of biphenotypic LPD.    #Biphenotypic B-cell lymphoproliferative disorder  -He has 2 distinct abnormal monoclonal B-cell populations.  One is classic for CLL with CD5+ and dim CD20 and kappa restricted.  The other is more consistent with marginal zone lymphoma which is CD5-/CD10-/CD20+ and lambda restricted.  As all pathology has been equal distribution of both cell types, it is unclear which or if both are contributing equally to the progressive lymphocytosis.  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  -Due to rapidly progressive lymphocytosis and progressive lymphadenopathy and anemia, he meets criteria for treatment.  -Interestingly, his peripheral blood flow from 07/23/22 showed 60% of the CD5- population and 10% of the CD5+ population, while the bone marrow biopsy from 08/06/22 demonstrated 90% of the CD5+ (although both populations were detected by IGH sequencing)  -Previously discussed potential treatment options, but the best option that would be effective against both clones is the combination of obinutuzumab  and acalabrutinib  as given for frontline CLL therapy in the ELEVATE [place] study. Obinutuzumab  would be given for 6 months and acalabrutinib  would be given continuously.  -He initiated therapy with obinutuzumab  and acalabrutinib  09/03/22, he has completed 6 months of obi and is now on continuous acalabruitinib.  - 02/24/23 CT c/a/p shows complete resolution of all lymphadenopathy consistent with a CR by CT criteria.  Since the plan will be for continuous acalabrutinib , will not complete a bone marrow biopsy to confirm CR.  - Still endorses joint pain mostly bilateral knee pain    #Falls and imbalance:  - States this was happening prior to starting acalabrutinib .  - Strongly recommended PT, which his significant other will help to organize locally.    #. Knee and back pain:  - Chronic present before acalabrutinib .  - He will go to PT as above.    #. Pre-Parkinsons  - He follows with neurology.                                                  #Anemia (Resolved)  -Likely related to B-cell malignancy as above.  -Also iron  deficient.  Recommended increase iron  supplementation to ferrous sulfate  325mg  every other day.  Recheck 09/03/22 with continued low Tsat at 14% and Ferritin at 40.  Hgb down to 10.  He received IV iron  on 10/03/22.  There is mild improvement in Fe panel. We will continue with PO supplementation.   -He had a colonoscopy in 2022 and said that he had polyps and would have to return in 2027 for next one.  He had consultation  visit on 6/13 with Dr. General Kenner at [facility] in [place] for EGD/colonoscopy, determined that he is not a candidate for colonoscopy d/t age and comorbidities.  -Also of note, he previously had a bladder tumor that was causing bleeding.  He underwent repeat cystoscopy on 10/22/22 with Dr. Jay Meth.  No evidence of recurrent disease.  He has requested repeat cystoscopy in 9 months, which should be ~2/25.  -We have reviewed with him that acalabrutinib  can worsen bleeding and if he notices any new bleeding, he knows to contact us  immediately.    #Dysphagia to solids   -Previously needed an esophageal dilation.  -EGD canceled by local GI given high risk deemed by Cardiologist  -Patient does not have bothersome dysphagia at this time so I think it is okay to defer for now. Patient also prefers to avoid scope.    #Multiple medical comorbidities.  -Managed by PCP and other specialists.      #. Preventive  Cancer screening:  Patients with CLL/SLL are at increased risk of developing secondary malignancies.    Annual dermatology evaluation, due to increased risk of non-melanomatous skin cancers.   If needed, blood products for transfusion should be irradiated.  Risk of Hypogammaglobulinemia: Baseline IgG levels 03/11/19 = 698, 07/23/22 1095  Vaccination  Avoid all live vaccines.  Recommend annual influenza vaccine.  Last given today, 09/02/23  Recommend COVID vaccine series and booster.  Last given:  04/13/20  Recommend pneumococcal vaccine with Prevnar 20.  Will need to receive this 6 months after completion of obinutuzumab  ~2/25  Recommend RSV vaccine. Last given:  07/18/22  Recommend Zoster vaccine recombinant, adjuvanted (Shingrix).  Last given: 01/18/13 (chart lists as zostavax, will need to confirm)     -RTC in 3 months.      [name] MD MPH  [facility]  PGY-4 Fellow    Hematology Attending Attestation:     I personally reviewed the above information with the dictating provider and interviewed and examined the patient, on the date of service, to confirm the history and physical findings. Labs and images were independently reviewed. I elaborated the plan, discussed it with the patient and the team, and revised the above text as required.     In total I spent 50 minutes in this visit which consisted of collecting a history and making medical management decisions for the diagnoses listed in my note, as well as reviewing and updating the patient's medical record, coordinating care, and documenting before, during and after the visit.     Deforest Fast, DO  Associate Professor of Medicine  Division of Hematology  [facility]   [facility]            Chief Complaint:     Chief Complaint   Patient presents with    Routine Follow-up     Patient states that he has been having more difficulty with walking, but he is still seeing a PCP, cardiologist, and neurologist.        Oncology/Treatment History:     Hematology/Oncology History   CLL (chronic lymphocytic leukemia)      05/21/2021 Initial Diagnosis    CLL (chronic lymphocytic leukemia) (CMS-HCC)  -While he was officially diagnosed in 2022, he had a mild lymphocytosis dating back to 2017, when he was undergoing radiation for his prostate cancer.  On 02/08/2016, he had a flow cytometry, which showed a small population of monoclonal B-cells that were CD5-/CD10- with a total  of 2.9k/uL clonal cells.  Observation was recommended.   -further evaluation of asymptomatic lymphocytosis ensued 05/21/2021.  -WBC 20.1, Hgb 12.5, Plt 238, ALC 13.8, ANC 3.96, B2M Not done, LDH 116 (ULN 192)  -Flow showed monoclonal B-cell population consistent with CLL with absolute clonal count of 3.74 k/UL, however a separate population of CD5-/CD10- monoclonal B-cells was detected  -Rai Stage: 1  -IGHV: Unmutated  -FISH showed: Normal  -Karyotype showed: Not completed.  -Recommendation: observation       07/01/2021 Interval Scan(s)    PET scan with scattered LAD, max size 2.0cm, No hepatosplenomegaly.     07/05/2021 Biopsy    Bone marrow biopsy showed hypercellular bone marrow with involvement of a low-grade B-cell lymphoproliferative disorder.  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 Ki-67 stain is variable in the interfollicular areas, 10% to 30%.   There are two separate B cell clones, in roughly equal proportions:   1) Kappa light chain restricted (dim expression), CD20+ (dim), CD5+, CD23+, CD43+, FMC-   2) Lambda light chain restricted, CD20+, CD5-, CD23+, CD43-, FMC+(dim). 06/26/2022 Interval Scan(s)    PET scan with continued LAD with mild progression.  Largest node 2.4cm in mesentery.     07/23/2022 Molecular Markers    -FISH showed: Normal  -Karyotype showed: 46,XY,del(7)(q31q32)[14]/46,XY[6]   -TP53 mutation was detected c.375G>A      08/05/2022 Biopsy    Bone marrow biopsy:  -  Hypercellular bone marrow (90%) primarily involved by CD5+ B-cell lymphoma/leukemia, consistent with chronic lymphocytic leukemia, representing approximately 70% of marrow cellularity by immunohistochemistry (see Comment)  -  Clonal immunoglobulin heavy (IGH) and kappa light chain (IGK) gene rearrangements identified (see Comment)      09/03/2022 -  Targeted Therapy    Obinutuzumab  + acalabrutinib      02/24/2023 Interval Scan(s)    Complete resolution of lymph nodes.           History Obtained From:   Patient and his significant other.    History of Present Illness:   Brendan Bishop is a 83 y.o. male with a history of multiple cancers, who presents with progressive lymphocytosis due to his lymphoproliferative disorder and consultation for treatment recommendations.  For detailed oncologic and treatment (if applicable) history, please see above.     Interim History   Since his last visit, Mr. Braley is doing well.    Brendan Bishop is an 83 year old male with chronic lymphocytic leukemia who presents to the clinic for  follow-up. He was accompanied by his partner of 46 years     He continues to take his  Acalabrutinib  for his CLL.  He still endorses joint pain, mostly at bilateral knees ( has history of bilateral knee replacement),  He takes Tylenol  and Tramadol which seems to help with pain     He endorses fatigue otherwise negative for fever/ chills/shortness of breath.  Blood pressure today noted 92/55, patient currently on Entresto/ midodrine/ Carvedilol. Patient advised to keep ambulatory blood pressure log  and discuss with Cardiologist about adjustment of blood pressure medications. He has since followed up with Ophthalmologist for his glaucoma and macular degeneration. Patient has been seen by Urologist on account of increased urinary frequency and currently on Finasteride with resolution of LUTS .    Labs reviewed during this visit     He denies fever/chills/chest pain/shortness of breath/ bleeding  We discussed physical therapy for his joint pains. Patient is agreeable to physical therapy. We don't think Acalabrutinib  is contributing to his joint pains     Review of Systems   Complete ROS performed and was negative except what is noted in interim history above.    Past Medical History     Past Medical History:   Diagnosis Date    Bladder cancer    09/13/2021    CAD (coronary artery disease)     CLL (chronic lymphocytic leukemia)        GERD (gastroesophageal reflux disease)     Hyperlipidemia     Hypertension     Marginal zone B-cell lymphoma        Multinodular goiter     OSA (obstructive sleep apnea)     Prostate cancer    10/2015    Stroke    2011       Past Surgical History:     Past Surgical History:   Procedure Laterality Date    BIOPSY OF THYROID [facility] HISTORICAL RESULT)      BONE MARROW BIOPSY      CARPAL TUNNEL RELEASE      CATARACT EXTRACTION Bilateral     CORONARY ARTERY BYPASS GRAFT  2005    HEMORRHOID SURGERY  2002    LYMPH NODE BIOPSY  07/08/2021    PAROTIDECTOMY Left 08/04/2017    TONSILLECTOMY AND ADENOIDECTOMY      TOTAL KNEE ARTHROPLASTY Left 2014    TOTAL KNEE ARTHROPLASTY Right 05/17/2018    TRANSURETHRAL RESECTION OF BLADDER TUMOR  09/13/2021       Allergies:     No Known Allergies      Family History:     Family History   Problem Relation Age of Onset    Heart disease Mother     Parkinsonism Father     Alzheimer's disease Father     Diabetes Paternal Grandfather        Social History:     Social History     Tobacco Use    Smoking status: Never    Smokeless tobacco: Never   Vaping Use    Vaping status: Never Used   Substance Use Topics    Alcohol use: Not Currently    Drug use: Never       Medications:     Current Outpatient Medications   Medication Sig Dispense Refill    finasteride (PROSCAR) 5 mg tablet Take 1 tablet (5 mg total) by mouth daily. TAKE 1 TABLET (5 MG TOTAL) BY MOUTH DAILY.      acalabrutinib  (CALQUENCE ) 100 mg tablet Take 1 tablet (100 mg total) by mouth two (2) times a day. Swallow whole with water. Do not to chew, crush, dissolve, or cut tablets. 60 tablet 11    acetaminophen  (TYLENOL ) 325 MG tablet Take 1.5 tablets (487.5 mg total) by mouth nightly. 100 tablet 2    allopurinol  (ZYLOPRIM ) 300 MG tablet Take 1 tablet (300 mg total) by mouth daily. 30 tablet 1    ascorbic acid, vitamin C, (VITAMIN C) 1000 MG tablet Take 1 tablet (1,000 mg total) by mouth.      aspirin (ECOTRIN) 81 MG tablet Take 1 tablet (81 mg total) by mouth.      atorvastatin (LIPITOR) 80 MG tablet       b complex vitamins (SUPER B-50 COMPLEX) capsule Take 1 capsule by mouth daily. 30 capsule 11    bimatoprost (LUMIGAN) 0.01 % Drop Administer 1 drop to  both eyes nightly. 30 mL 0    cholecalciferol, vitamin D3-250 mcg, 10,000 unit,, 250 mcg (10,000 unit) capsule Take 0.5 capsules (125 mcg total) by mouth daily. 120 capsule 2    coenzyme Q10 200 mg capsule Take 1 capsule (200 mg total) by mouth.      cyanocobalamin, vitamin B-12, 1000 MCG tablet Take 0.5 tablets (500 mcg total) by mouth daily. 30 tablet 0    ENTRESTO 24-26 mg tablet Take 1 tablet by mouth.      ferrous sulfate  325 (65 FE) MG tablet Take 1 tablet (325 mg total) by mouth every other day. 30 tablet 11    fish,bora,flax oils-om3,6,9no1 (OMEGA 3-6-9) 1,200 mg cap Take 1 tablet by mouth in the morning. 30 capsule 0    L.acidophil-L.plantar-Bifido 7 15 billion cell cap Take 5 mg by mouth in the morning. 30 capsule 0    lysine 1,000 mg Tab Take 1,000 mg by mouth.      magnesium gluconate (MAGONATE) 500 mg (27 mg elem magnesium) tablet Take 1 tablet by mouth daily. 30 tablet 0    midodrine (PROAMATINE) 5 MG tablet Take 1 tablet (5 mg total) by mouth Three (3) times a day.      mv-min-FA-vit K-lutein-zeaxant (PRESERVISION AREDS 2 PLUS MV) 200 mcg-15 mcg- 5 mg-1 mg cap Take 1 tablet by mouth in the morning. 30 capsule 0    NON FORMULARY Astralagus, Black elderberry, reishii      ondansetron  (ZOFRAN ) 4 MG tablet Take 1 tablet (4 mg) by mouth once a day as needed for nausea. 30 tablet 0    selenium 200 mcg Tab Take 1 tablet (200 mcg total) by mouth.      tamsulosin (FLOMAX) 0.4 mg capsule       traMADol (ULTRAM) 50 mg tablet TAKE 1 TO 2 TABLETS BY MOUTH THREE TIMES DAILY AS NEEDED FOR PAIN      valACYclovir  (VALTREX ) 500 MG tablet Take 1 tablet (500 mg total) by mouth daily. 30 tablet 5    zinc gluconate 50 mg (7 mg elemental zinc) tablet Take 1 tablet (50 mg total) by mouth daily.       No current facility-administered medications for this visit.       Physical Exam:     PHYSICAL EXAMINATION:  ECOG: 1  VITAL SIGNS:  BP 92/55  - Pulse 68  - Temp 36.2 ??C (97.2 ??F) (Temporal)  - Ht 167.6 cm (5' 6)  - Wt 78.7 kg (173 lb 8 oz)  - SpO2 97%  - BMI 28.00 kg/m??     General: well appearing, in no acute distress  HEENT: Sclera anicteric. Conjunctivae not injected, no neck pain or rigidity noted.  Resp: Normal WOB, symmetric excursion, CTAB  CV: RRR without murmurs, rubs or gallops.    GI: Normal bowel sounds, abdomen soft, non-tender, non-distended  Skin: fine, thin lacey flesh colored papular patches to back and torso.   Neuro: Alert and oriented to conversation, moves all extremities without asymmetry and with at least antigravity strength  Psych: Normal affect  Extremities: No clubbing or lower extremity edema appreciated.  LYMPH: No palpable cervical, supraclavicular, axillary, inguinal lymphadenopathy. Palpable cervical glands, not distinct LN.      Labs:   See oncology history      Pathology:   See oncology history    Imaging:   I personally reviewed all of the patient's pertinent images and documented any relevant findings in the assessment/plan.  See outside records.  See HPI

## 2023-12-02 ENCOUNTER — Ambulatory Visit: Admit: 2023-12-02 | Discharge: 2023-12-03 | Payer: Medicare (Managed Care) | Attending: Hematology | Primary: Hematology

## 2023-12-02 ENCOUNTER — Other Ambulatory Visit: Admit: 2023-12-02 | Discharge: 2023-12-03 | Payer: Medicare (Managed Care)

## 2023-12-02 DIAGNOSIS — C9110 Chronic lymphocytic leukemia of B-cell type not having achieved remission: Principal | ICD-10-CM

## 2023-12-02 LAB — COMPREHENSIVE METABOLIC PANEL
ALBUMIN: 3.6 g/dL (ref 3.4–5.0)
ALKALINE PHOSPHATASE: 60 U/L (ref 46–116)
ALT (SGPT): 21 U/L (ref 10–49)
ANION GAP: 5 mmol/L (ref 5–14)
AST (SGOT): 27 U/L (ref <=34)
BILIRUBIN TOTAL: 0.4 mg/dL (ref 0.3–1.2)
BLOOD UREA NITROGEN: 25 mg/dL — ABNORMAL HIGH (ref 9–23)
BUN / CREAT RATIO: 25
CALCIUM: 9.9 mg/dL (ref 8.7–10.4)
CHLORIDE: 110 mmol/L — ABNORMAL HIGH (ref 98–107)
CO2: 26 mmol/L (ref 20.0–31.0)
CREATININE: 1.01 mg/dL (ref 0.73–1.18)
EGFR CKD-EPI (2021) MALE: 74 mL/min/1.73m2 (ref >=60)
GLUCOSE RANDOM: 93 mg/dL (ref 70–179)
POTASSIUM: 4.6 mmol/L (ref 3.4–4.8)
PROTEIN TOTAL: 6.1 g/dL (ref 5.7–8.2)
SODIUM: 141 mmol/L (ref 135–145)

## 2023-12-02 LAB — CBC W/ AUTO DIFF
BASOPHILS ABSOLUTE COUNT: 0 10*9/L (ref 0.0–0.1)
BASOPHILS RELATIVE PERCENT: 0.4 %
EOSINOPHILS ABSOLUTE COUNT: 0.3 10*9/L (ref 0.0–0.5)
EOSINOPHILS RELATIVE PERCENT: 4 %
HEMATOCRIT: 40.3 % (ref 39.0–48.0)
HEMOGLOBIN: 13.6 g/dL (ref 12.9–16.5)
LYMPHOCYTES ABSOLUTE COUNT: 1.8 10*9/L (ref 1.1–3.6)
LYMPHOCYTES RELATIVE PERCENT: 23.8 %
MEAN CORPUSCULAR HEMOGLOBIN CONC: 33.6 g/dL (ref 32.0–36.0)
MEAN CORPUSCULAR HEMOGLOBIN: 32.1 pg (ref 25.9–32.4)
MEAN CORPUSCULAR VOLUME: 95.5 fL (ref 77.6–95.7)
MEAN PLATELET VOLUME: 9.4 fL (ref 6.8–10.7)
MONOCYTES ABSOLUTE COUNT: 0.7 10*9/L (ref 0.3–0.8)
MONOCYTES RELATIVE PERCENT: 8.9 %
NEUTROPHILS ABSOLUTE COUNT: 4.9 10*9/L (ref 1.8–7.8)
NEUTROPHILS RELATIVE PERCENT: 62.9 %
PLATELET COUNT: 182 10*9/L (ref 150–450)
RED BLOOD CELL COUNT: 4.22 10*12/L — ABNORMAL LOW (ref 4.26–5.60)
RED CELL DISTRIBUTION WIDTH: 12.8 % (ref 12.2–15.2)
WBC ADJUSTED: 7.8 10*9/L (ref 3.6–11.2)

## 2023-12-02 NOTE — Unmapped (Signed)
 [facility] Refill Coordination Note    Brendan Bishop, DOB: 1941-01-23  Phone: [phone] (home) [phone] (work)      All above HIPAA information was verified with patient.         12/01/2023     5:42 PM   Specialty Rx Medication Refill Questionnaire   Which Medications would you like refilled and shipped? Calquence  & Iron  Medications   Please list all current allergies: None   Have you missed any doses in the last 30 days? No   Have you had any changes to your medication(s) since your last refill? No   How many days remaining of each medication do you have at home? 1 week   Have you experienced any side effects in the last 30 days? No   Please enter the full address (street address, city, state, zip code) where you would like your medication(s) to be delivered to. [address] [place], [place], [place] [address]   Please specify on which day you would like your medication(s) to arrive. Note: if you need your medication(s) within 3 days, please call the pharmacy to schedule your order at [phone]  12/07/2023   Has your insurance changed since your last refill? No   Would you like a pharmacist to call you to discuss your medication(s)? No   Do you require a signature for your package? (Note: if we are billing Medicare Part B or your order contains a controlled substance, we will require a signature) No   I have been provided my out of pocket cost for my medication and approve the pharmacy to charge the amount to my credit card on file. Yes   Additional Comments: Delivery to the aforesaid address should be between 3-6 PM         Completed refill call assessment today to schedule patient's medication shipment from the [facility] [phone]).  All relevant notes have been reviewed.       Confirmed patient received a Conservation officer, historic buildings and a Surveyor, mining with first shipment. The patient will receive a drug information handout for each medication shipped and additional FDA Medication Guides as required.         REFERRAL TO PHARMACIST     Referral to the pharmacist: Not needed      [facility]     Shipping address confirmed in Epic.     Delivery Scheduled: Yes, Expected medication delivery date: 12/07/23.     Medication will be delivered via Same Day Courier to the prescription address in Epic [place].    Lorah Kalina M Santer Torres   [facility] Specialty Technician

## 2023-12-03 DIAGNOSIS — C9110 Chronic lymphocytic leukemia of B-cell type not having achieved remission: Principal | ICD-10-CM

## 2023-12-07 MED FILL — CALQUENCE (ACALABRUTINIB MALEATE) 100 MG TABLET: ORAL | 30 days supply | Qty: 60 | Fill #4

## 2023-12-09 DIAGNOSIS — C9110 Chronic lymphocytic leukemia of B-cell type not having achieved remission: Principal | ICD-10-CM

## 2023-12-10 DIAGNOSIS — C9110 Chronic lymphocytic leukemia of B-cell type not having achieved remission: Principal | ICD-10-CM

## 2023-12-18 MED ORDER — VALACYCLOVIR 500 MG TABLET
ORAL_TABLET | Freq: Every day | ORAL | 5 refills | 30.00000 days | Status: CP
Start: 2023-12-18 — End: ?
  Filled 2023-12-21: qty 30, 30d supply, fill #0

## 2024-01-04 DIAGNOSIS — C9110 Chronic lymphocytic leukemia of B-cell type not having achieved remission: Principal | ICD-10-CM

## 2024-01-04 NOTE — Unmapped (Signed)
 The [facility] has made a second and final attempt to reach this patient to refill the following medication:Calquence .      We have left voicemails on the following phone numbers: [phone], have been unable to leave messages on the following phone numbers: [phone], have sent a MyChart message, have sent a text message to the following phone numbers: [phone], and have sent a Mychart questionnaire..    Dates contacted: 7/5,14  Last scheduled delivery: 12/07/23    The patient may be at risk of non-compliance with this medication. The patient should call the [facility] at [phone]  Option 4, then Option 1: Oncology to refill medication.    Maryla CHRISTELLA [facility] Specialty Technician

## 2024-01-15 NOTE — Unmapped (Signed)
 01/15/2024 - Patient originally requested delivery for 01/15/2024. Delivery is not possible on this date due to insufficient inventory. I have reached out to the patient and confirmed that delivery on 01/18/2024 is ok.    [facility] Pharm[facility] Refill Coordination Note    Brendan Bishop, DOB: Dec 22, 1940  Phone: [phone] (home) [phone] (work)      All above HIPAA information was verified with patient.         01/13/2024     6:02 PM   Specialty Rx Medication Refill Questionnaire   Which Medications would you like refilled and shipped? Calquence , Presception will expire on Friday, Delivery should be by Friday at [address] [place], [place], [place] at my Office between hours of 3-6 PM   Please list all current allergies: None   Have you missed any doses in the last 30 days? No   Have you had any changes to your medication(s) since your last refill? No   How much of each medication do you have remaining at home? (eg. number of tablets, injections, etc.) I only have enough to last until Friday   Have you experienced any side effects in the last 30 days? No   Please enter the full address (street address, city, state, zip code) where you would like your medication(s) to be delivered to. [address] [place], [place], [place] [address], (Between the hours of 3-6 PM)   Please specify on which day you would like your medication(s) to arrive. Note: if you need your medication(s) within 3 days, please call the pharmacy to schedule your order at [phone]  01/15/2024   Has your insurance changed since your last refill? No   Would you like a pharmacist to call you to discuss your medication(s)? No   Do you require a signature for your package? (Note: if we are billing Medicare Part B or your order contains a controlled substance, we will require a signature) No   I have been provided my out of pocket cost for my medication and approve the pharmacy to charge the amount to my credit card on file. No   Additional Comments: The co-payment for this medication is covered by a grant in effect until 06/2024.         Completed refill call assessment today to schedule patient's medication shipment from the [facility] [phone]).  All relevant notes have been reviewed.       Confirmed patient received a Conservation officer, historic buildings and a Surveyor, mining with first shipment. The patient will receive a drug information handout for each medication shipped and additional FDA Medication Guides as required.         REFERRAL TO PHARMACIST     Referral to the pharmacist: Not needed      [facility]     Shipping address confirmed in Epic.     Delivery Scheduled: Yes, Expected medication delivery date: 01/18/2024.     Medication will be delivered via Same Day Courier to the prescription address in Epic [place].    Lamarr CHRISTELLA Dross   [facility] Specialty Technician

## 2024-01-18 MED FILL — CALQUENCE (ACALABRUTINIB MALEATE) 100 MG TABLET: ORAL | 30 days supply | Qty: 60 | Fill #5

## 2024-01-25 MED ORDER — FERROUS SULFATE 325 MG (65 MG IRON) TABLET
ORAL_TABLET | ORAL | 11 refills | 60.00000 days
Start: 2024-01-25 — End: 2025-01-24

## 2024-01-25 NOTE — Unmapped (Signed)
 Please refill if appropriate.     Most recent clinic visit: 12/02/2023  Next clinic visit: 02/24/2024

## 2024-01-28 DIAGNOSIS — C9110 Chronic lymphocytic leukemia of B-cell type not having achieved remission: Principal | ICD-10-CM

## 2024-01-28 MED ORDER — FERROUS SULFATE 325 MG (65 MG IRON) TABLET
ORAL_TABLET | ORAL | 3 refills | 90.00000 days | Status: CP
Start: 2024-01-28 — End: 2025-01-27
  Filled 2024-01-28: qty 45, 90d supply, fill #0

## 2024-01-28 MED FILL — VALACYCLOVIR 500 MG TABLET: ORAL | 30 days supply | Qty: 30 | Fill #1

## 2024-02-05 NOTE — Telephone Encounter (Signed)
 Called and spoke to patient.  He indicated that he is done with treatment but still having regular checkups and is not ready to discuss scheduling an EGD/Colonoscopy at this time. He indicates he will call us  when he is ready to schedule an appointment and does not require additional calls.

## 2024-02-05 NOTE — Telephone Encounter (Signed)
 Noted.  He will be removed from Hospital list and will call us  when he is ready to schedule OV to discuss ECL

## 2024-02-16 DIAGNOSIS — C9110 Chronic lymphocytic leukemia of B-cell type not having achieved remission: Principal | ICD-10-CM

## 2024-02-16 NOTE — Unmapped (Signed)
 [facility] Clinical Assessment & Refill Coordination Note    Brendan Bishop, DOB: 12/02/1940  Phone: [phone] (home) [phone] (work)    All above HIPAA information was verified with patient.     Was a Nurse, learning disability used for this call? No    Specialty Medication(s):   Hematology/Oncology: Calquence      Current Medications[1]     Changes to medications: Brendan Bishop reports starting the following medications: timolol    Medication list has been reviewed and updated in Epic: Yes    Allergies[2]    Changes to allergies: No    Allergies have been reviewed and updated in Epic: Yes    SPECIALTY MEDICATION ADHERENCE     Calquence  100 mg: 3 days of medicine on hand     Medication Adherence    Patient reported X missed doses in the last month: 0  Specialty Medication: Calquence  100 mg twice daily  Patient is on additional specialty medications: No  Informant: patient  Confirmed plan for next specialty medication refill: delivery by pharmacy  Refills needed for supportive medications: not needed          Are there any concerns with adherence? No    Adherence counseling provided? Not needed    Patient-Reported Symptoms Tracker for Cancer Patients on Oral Chemotherapy     Oral chemotherapy medication name(s): Calquence   Dose and frequency: 100 mg twice daily  Oral Chemotherapy Start Date:    Baseline? No  Clinic(s) visited: Hematology  Were you able to reach the patient on a call today? Yes    Symptom Grouping Question Patient Response   Digestion and Eating Have you felt sick to your stomach? Denies    Had diarrhea? Denies    Constipated? Denies    Not wanting to eat? Denies    Comments      Sleep and Pain Felt very tired even after you rest? Denies    Pain due to cancer medication or cancer? Denies    Comments     Other Side Effects Numbness or tingling in hands and/or feet? Denies    Felt short of breath? Denies    Mouth or throat Sores? Denies    Rash? Denies    Palmar-plantar erythrodysesthesia syndrome?      Rash - acneiform?      Rash - maculo-papular?      How many days over the past month did your cancer medication or cancer keep you from your normal activities?  Write in number of days, 0-30:  0    Other side effects or things you would like to discuss?      Comments?     Adherence  In the last 30 days, on how many days did you miss at least one dose of any of your [drug name]? Write in number of days, 0-30:  0    What reasons are you having trouble taking your medication [pharmacist: check all that apply]? Specify chemotherapy cycle:        No problems identified    Comments:        Comments       Optional Symptom Tracking  New start - hematology ([facility]    Comments:      Specialty Pharmacist Interventions:     Question Patient Response    What actions did you take in response to the patient's PRO answers?     Other (Specify):  reviewed med and allergy list  CLINICAL MANAGEMENT AND INTERVENTION      Clinical Benefit Assessment:    Do you feel the medicine is effective or helping your condition? Yes    Clinical Benefit counseling provided? Not needed    Acute Infection Status:    Acute infections noted within Epic:  No active infections    Patient reported infection: None    Therapy Appropriateness:    Is therapy appropriate based on current medication list, adverse reactions, adherence, clinical benefit and progress toward achieving therapeutic goals?  Yes, therapy is appropriate and should be continued    DISEASE/MEDICATION-SPECIFIC INFORMATION      N/A    Is the patient receiving adequate infection prevention treatment? Not applicable    Does the patient have adequate nutritional support? Not applicable    PATIENT SPECIFIC NEEDS     Does the patient have any physical, cognitive, or cultural barriers? No    Is the patient high risk? Yes, patient is taking oral chemotherapy. Appropriateness of therapy as been assessed    Did the patient require a clinical intervention? No    Does the patient require physician intervention or other additional services (i.e., nutrition, smoking cessation, social work)? No    Does the patient have an additional or emergency contact listed in their chart? Yes    SOCIAL DETERMINANTS OF HEALTH     At the [facility], we have learned that life circumstances - like trouble affording food, housing, utilities, or transportation can affect the health of many of our patients.   That is why we wanted to ask: are you currently experiencing any life circumstances that are negatively impacting your health and/or quality of life? Patient declined to answer    Social Drivers of Health     Food Insecurity: Not on file   Tobacco Use: Low Risk  (12/02/2023)    Patient History     Smoking Tobacco Use: Never     Smokeless Tobacco Use: Never     Passive Exposure: Not on file   Recent Concern: Tobacco Use - Medium Risk (11/26/2023)    Received from [facility]    Patient History     Smoking Tobacco Use: Former     Smokeless Tobacco Use: Never     Passive Exposure: Not on file   Transportation Needs: Not on file   Alcohol Use: Unknown (09/29/2019)    Received from [facility]    AUDIT-C     Q1: How often do you have a drink containing alcohol?: 2-3 times a week     Average Number of Drinks: Not on file     Frequency of Binge Drinking: Not on file   Housing: Unknown (07/20/2023)    Received from [facility]    Housing Stability Vital Sign     Unable to Pay for Housing in the Last Year: Not on file     Number of Times Moved in the Last Year: Not on file     At any time in the past 12 months, were you homeless or living in a shelter (including now)?: No   Physical Activity: Not on file   Utilities: Not on file   Stress: Not on file   Interpersonal Safety: Not on file   Substance Use: Not on file (05/03/2023)   Intimate Partner Violence: Not on file   Social Connections: Not on file   Financial Resource Strain: Not on file   Health Literacy: Not on file  Internet Connectivity: Not on file       Would you be willing to receive help with any of the needs that you have identified today? Not applicable       SHIPPING     Specialty Medication(s) to be Shipped:   Hematology/Oncology: Calquence     Other medication(s) to be shipped: No additional medications requested for fill at this time    Specialty Medications not needed at this time: N/A     Changes to insurance: No    Cost and Payment: Patient has a $0 copay, payment information is not required.    Delivery Scheduled: Yes, Expected medication delivery date: 02/18/24.     Medication will be delivered via Next Day Courier to the confirmed prescription address in [facility].    The patient will receive a drug information handout for each medication shipped and additional FDA Medication Guides as required.  Verified that patient has previously received a Conservation officer, historic buildings and a Surveyor, mining.    The patient or caregiver noted above participated in the development of this care plan and knows that they can request review of or adjustments to the care plan at any time.      All of the patient's questions and concerns have been addressed.    Aneita DELENA Merck, [facility]   [place] [facility]  Pharmacist       [1]   Current Outpatient Medications   Medication Sig Dispense Refill    acalabrutinib  (CALQUENCE ) 100 mg tablet Take 1 tablet (100 mg total) by mouth two (2) times a day. Swallow whole with water. Do not to chew, crush, dissolve, or cut tablets. 60 tablet 11    acetaminophen  (TYLENOL ) 325 MG tablet Take 1.5 tablets (487.5 mg total) by mouth nightly. 100 tablet 2    allopurinol  (ZYLOPRIM ) 300 MG tablet Take 1 tablet (300 mg total) by mouth daily. 30 tablet 1    ascorbic acid, vitamin C, (VITAMIN C) 1000 MG tablet Take 1 tablet (1,000 mg total) by mouth.      aspirin (ECOTRIN) 81 MG tablet Take 1 tablet (81 mg total) by mouth. atorvastatin (LIPITOR) 80 MG tablet       b complex vitamins (SUPER B-50 COMPLEX) capsule Take 1 capsule by mouth daily. 30 capsule 11    cholecalciferol, vitamin D3-250 mcg, 10,000 unit,, 250 mcg (10,000 unit) capsule Take 0.5 capsules (125 mcg total) by mouth daily. 120 capsule 2    coenzyme Q10 200 mg capsule Take 1 capsule (200 mg total) by mouth.      cyanocobalamin, vitamin B-12, 1000 MCG tablet Take 0.5 tablets (500 mcg total) by mouth daily. 30 tablet 0    ENTRESTO 24-26 mg tablet Take 1 tablet by mouth.      ferrous sulfate  (FEROSUL) 325 (65 FE) MG tablet Take 1 tablet (325 mg total) by mouth every other day. 45 tablet 3    finasteride (PROSCAR) 5 mg tablet Take 1 tablet (5 mg total) by mouth daily. TAKE 1 TABLET (5 MG TOTAL) BY MOUTH DAILY.      fish,bora,flax oils-om3,6,9no1 (OMEGA 3-6-9) 1,200 mg cap Take 1 tablet by mouth in the morning. 30 capsule 0    L.acidophil-L.plantar-Bifido 7 15 billion cell cap Take 5 mg by mouth in the morning. 30 capsule 0    lysine 1,000 mg Tab Take 1,000 mg by mouth.      magnesium gluconate (MAGONATE) 500 mg (27 mg elem magnesium) tablet Take 1 tablet  by mouth daily. 30 tablet 0    meloxicam (MOBIC) 15 MG tablet Take 1 tablet (15 mg total) by mouth daily.      metoPROLOL succinate (TOPROL-XL) 25 MG 24 hr tablet Take 0.5 tablets (12.5 mg total) by mouth daily.      mv-min-FA-vit K-lutein-zeaxant (PRESERVISION AREDS 2 PLUS MV) 200 mcg-15 mcg- 5 mg-1 mg cap Take 1 tablet by mouth in the morning. 30 capsule 0    ondansetron  (ZOFRAN ) 4 MG tablet Take 1 tablet (4 mg) by mouth once a day as needed for nausea. 30 tablet 0    pramipexole (MIRAPEX) 0.5 MG tablet Take 1 tablet (0.5 mg total) by mouth nightly.      selenium 200 mcg Tab Take 1 tablet (200 mcg total) by mouth.      tamsulosin (FLOMAX) 0.4 mg capsule       timolol (BETIMOL) 0.5 % ophthalmic solution 1 drop two (2) times a day.      traMADol (ULTRAM) 50 mg tablet TAKE 1 TO 2 TABLETS BY MOUTH THREE TIMES DAILY AS NEEDED FOR PAIN      valACYclovir  (VALTREX ) 500 MG tablet Take 1 tablet (500 mg total) by mouth daily. 30 tablet 5    zinc gluconate 50 mg (7 mg elemental zinc) tablet Take 1 tablet (50 mg total) by mouth daily.       No current facility-administered medications for this visit.   [2] No Known Allergies

## 2024-02-17 MED FILL — CALQUENCE (ACALABRUTINIB MALEATE) 100 MG TABLET: ORAL | 30 days supply | Qty: 60 | Fill #6

## 2024-02-23 NOTE — Unmapped (Signed)
 [facility]   Primary Hematologist: Barnie Drones, DO   Specialty: CLL  Visit Type: Follow up - On treatment     Patient: Brendan Bishop  MRN: [id number]  DOB: 1941-05-31  DOS: 02/24/2024    Referring Physician: Drones Barnie Jansky*    Chronic Lymphocytic Leukemia  Date of Diagnosis 05/21/2021   Rai Stage at Dx 1   Beta-2 microglobulin at Dx Not done   IGHV Mutational Status Un-mutated   FISH Normal   Cytogenetics 46,XY,del(7)(q31q32)[14]/46,XY[6]    Mutations TP53 mutation c.375G>A    CLL-IPI Score at Dx Unable to calculate, but at least 4 for age/stage/IGHV     Assessment/Plan:   83 y.o. male with a history of multiple cancers, who presents for follow-up of biphenotypic LPD.        #Biphenotypic B-cell lymphoproliferative disorder  -He has 2 distinct abnormal monoclonal B-cell populations.  One is classic for CLL with CD5+ and dim CD20 and kappa restricted.  The other is more consistent with marginal zone lymphoma which is CD5-/CD10-/CD20+ and lambda restricted.  As all pathology has been equal distribution of both cell types, it is unclear which or if both are contributing equally to the progressive lymphocytosis.  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  -Due to rapidly progressive lymphocytosis and progressive lymphadenopathy and anemia, he meets criteria for treatment.  -Interestingly, his peripheral blood flow from 07/23/22 showed 60% of the CD5- population and 10% of the CD5+ population, while the bone marrow biopsy from 08/06/22 demonstrated 90% of the CD5+ (although both populations were detected by IGH sequencing)  -Previously discussed potential treatment options, but the best option that would be effective against both clones is the combination of obinutuzumab  and acalabrutinib  as given for frontline CLL therapy in the E[facility]ATE [place] study. Obinutuzumab  was given for 6 months and acalabrutinib  is given continuously.  -He initiated therapy with obinutuzumab  and acalabrutinib  09/03/22, he has completed 6 months of obi and is now on continuous acalabruitinib.  - 02/24/23 CT c/a/p shows complete resolution of all lymphadenopathy consistent with a CR by CT criteria.  Since the plan will be for continuous acalabrutinib , will not complete a bone marrow biopsy to confirm CR.  - Continued evidence of clinical response.  - Plan for CT scans in 3 months with follow-up visit.  - Continue acalabrutinib  100mg  BID    #Skin reaction due to insect bites and topical agents  Prominent rash and swelling on face, likely due to bug spray, persisting for a month. Hydrocortisone cream recommended with caution regarding potential skin thinning. Advised to use a more natural bug spray. Apple cider vinegar may exacerbate redness.  - Use 1% hydrocortisone cream on bug bites  - Try a more natural bug spray with less chemicals  - Has a dermatologist Dr. Arlyss that is following him close to home.    #Falls and imbalance:  - States this was happening prior to starting acalabrutinib .  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  Now he has finished his 14 sessions and they did not recommend returning.    #. Knee and back pain:  - Chronic present before acalabrutinib .  - He will go to PT as above.    #. Pre-Parkinsons  - He follows with neurology.                                               #  Dysphagia to solids   -Previously needed an esophageal dilation.  -EGD canceled by local GI given high risk deemed by Cardiologist    #Multiple medical comorbidities.  -Managed by PCP and other specialists.      #. Preventive  Cancer screening:  Patients with CLL/SLL are at increased risk of developing secondary malignancies.    Annual dermatology evaluation, due to increased risk of non-melanomatous skin cancers.   If needed, blood products for transfusion should be irradiated.  Risk of Hypogammaglobulinemia: Baseline IgG levels 03/11/19 = 698, 07/23/22 1095  Vaccination  Avoid all live vaccines.  Recommend annual influenza vaccine.  Last given today, 09/02/23  Recommend COVID vaccine series and booster.  Last given:  04/13/20  Recommend pneumococcal vaccine with Prevnar 20.  Will address at next visit.  Recommend RSV vaccine. Last given:  07/18/22  Recommend Zoster vaccine recombinant, adjuvanted (Shingrix).  Last given: 01/18/13 (chart lists as zostavax, will need to confirm)     -RTC in 3 months with CT scans prior.  -Continue acalabrutinib  100mg  BID.        In total I spent 43 minutes in this visit which consisted of collecting a history and making medical management decisions for the diagnoses listed in my note, as well as reviewing and updating the patient's medical record, coordinating care, and documenting before, during and after the visit.     Barnie EMERSON Drones, DO  Associate Professor of Medicine  Division of Hematology  [facility]  [facility]            Chief Complaint:     Chief Complaint   Patient presents with    Routine Follow-up       Oncology/Treatment History:     Hematology/Oncology History   CLL (chronic lymphocytic leukemia)    (CMS-HCC)   05/21/2021 Initial Diagnosis    CLL (chronic lymphocytic leukemia) (CMS-HCC)  -While he was officially diagnosed in 2022, he had a mild lymphocytosis dating back to 2017, when he was undergoing radiation for his prostate cancer.  On 02/08/2016, he had a flow cytometry, which showed a small population of monoclonal B-cells that were CD5-/CD10- with a total of 2.9k/uL clonal cells.  Observation was recommended.   -further evaluation of asymptomatic lymphocytosis ensued 05/21/2021.  -WBC 20.1, Hgb 12.5, Plt 238, ALC 13.8, ANC 3.96, B2M Not done, LDH 116 (ULN 192)  -Flow showed monoclonal B-cell population consistent with CLL with absolute clonal count of 3.74 k/UL, however a separate population of CD5-/CD10- monoclonal B-cells was detected  -Rai Stage: 1  -IGHV: Unmutated  -FISH showed: Normal  -Karyotype showed: Not completed.  -Recommendation: observation       07/01/2021 Interval Scan(s)    PET scan with scattered LAD, max size 2.0cm, No hepatosplenomegaly.     07/05/2021 Biopsy    Bone marrow biopsy showed hypercellular bone marrow with involvement of a low-grade B-cell lymphoproliferative disorder.  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 Ki-67 stain is variable in the interfollicular areas, 10% to 30%.   There are two separate B cell clones, in roughly equal proportions:   1) Kappa light chain restricted (dim expression), CD20+ (dim), CD5+, CD23+, CD43+, FMC-   2) Lambda light chain restricted, CD20+, CD5-, CD23+, CD43-, FMC+(dim).      06/26/2022 Interval Scan(s)    PET scan with continued LAD with  mild progression.  Largest node 2.4cm in mesentery.     07/23/2022 Molecular Markers    -FISH showed: Normal  -Karyotype showed: 46,XY,del(7)(q31q32)[14]/46,XY[6]   -TP53 mutation was detected c.375G>A      08/05/2022 Biopsy    Bone marrow biopsy:  -  Hypercellular bone marrow (90%) primarily involved by CD5+ B-cell lymphoma/leukemia, consistent with chronic lymphocytic leukemia, representing approximately 70% of marrow cellularity by immunohistochemistry (see Comment)  -  Clonal immunoglobulin heavy (IGH) and kappa light chain (IGK) gene rearrangements identified (see Comment)      09/03/2022 -  Targeted Therapy    Obinutuzumab  + acalabrutinib      02/24/2023 Interval Scan(s)    Complete resolution of lymph nodes.           History Obtained From:   Patient and his significant other.    History of Present Illness:   DIANGELO RADEL is a 83 y.o. male with a history of multiple cancers, who presents with progressive lymphocytosis due to his lymphoproliferative disorder and consultation for treatment recommendations.  For detailed oncologic and treatment (if applicable) history, please see above.     Interim History     ZOLTAN GENEST is an 83 year old male with chronic lymphocytic leukemia who presents for follow-up regarding skin reactions and swelling.    He has been experiencing multiple bug bites on his forehead and neck, which occurred while walking outside on his property. He has been using bug spray, which he suspects may have contributed to a persistent rash on his face lasting about a month. Over-the-counter hydrocortisone cream (1%) has been used for the bug bites.    Swelling in his hand is attributed to arthritis, and swelling in the salivary glands was previously evaluated by an ENT and dermatologist. The swelling was initially concerning due to a history of cancer in the area, and he was evaluated by an ENT and dermatologist who did not express concern for cancer recurrence. 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    He describes a rash on his face, characterized by red cheeks, which he associates with the use of bug spray. He applied apple cider vinegar on his face for the first time last night, which may have caused further redness.    He has been undergoing physical therapy for stability issues related to pre-Parkinsonism, completing fourteen sessions. He reports improved stability and has not experienced falls since starting therapy. He continues to perform exercises at home to maintain stability.    He is currently taking Calquence  twice daily for chronic lymphocytic leukemia, which is being delivered to his home. He is concerned about the cost and insurance coverage for the medication, as well as the potential need for continued use.     Review of Systems   Complete ROS performed and was negative except what is noted in interim history above.    Past Medical History     Past Medical History:   Diagnosis Date    Bladder cancer    (CMS-HCC) 09/13/2021    CAD (coronary artery disease)     CLL (chronic lymphocytic leukemia)    (CMS-HCC)     GERD (gastroesophageal reflux disease)     Hyperlipidemia     Hypertension     Marginal zone B-cell lymphoma    (CMS-HCC)     Multinodular goiter     OSA (obstructive sleep apnea)     Prostate cancer    (CMS-HCC) 10/2015  Stroke    (CMS-HCC) 2011       Past Surgical History:     Past Surgical History:   Procedure Laterality Date    BIOPSY OF THYROID [facility] HISTORICAL RESULT)      BONE MARROW BIOPSY      CARPAL TUNNEL RELEASE      CATARACT EXTRACTION Bilateral     CORONARY ARTERY BYPASS GRAFT  2005    HEMORRHOID SURGERY  2002    LYMPH NODE BIOPSY  07/08/2021    PAROTIDECTOMY Left 08/04/2017    TONSILLECTOMY AND ADENOIDECTOMY      TOTAL KNEE ARTHROPLASTY Left 2014    TOTAL KNEE ARTHROPLASTY Right 05/17/2018    TRANSURETHRAL RESECTION OF BLADDER TUMOR  09/13/2021       Allergies:     No Known Allergies      Family History:     Family History   Problem Relation Age of Onset    Heart disease Mother     Parkinsonism Father     Alzheimer's disease Father     Diabetes Paternal Grandfather        Social History:     Social History     Tobacco Use    Smoking status: Never    Smokeless tobacco: Never   Vaping Use    Vaping status: Never Used   Substance Use Topics    Alcohol use: Not Currently    Drug use: Never       Medications:     Current Outpatient Medications   Medication Sig Dispense Refill    acalabrutinib  (CALQUENCE ) 100 mg tablet Take 1 tablet (100 mg total) by mouth two (2) times a day. Swallow whole with water. Do not to chew, crush, dissolve, or cut tablets. 60 tablet 11    acetaminophen  (TYLENOL ) 325 MG tablet Take 1.5 tablets (487.5 mg total) by mouth nightly. 100 tablet 2    allopurinol  (ZYLOPRIM ) 300 MG tablet Take 1 tablet (300 mg total) by mouth daily. 30 tablet 1    ascorbic acid, vitamin C, (VITAMIN C) 1000 MG tablet Take 1 tablet (1,000 mg total) by mouth.      aspirin (ECOTRIN) 81 MG tablet Take 1 tablet (81 mg total) by mouth.      atorvastatin (LIPITOR) 80 MG tablet       b complex vitamins (SUPER B-50 COMPLEX) capsule Take 1 capsule by mouth daily. 30 capsule 11    cholecalciferol, vitamin D3-250 mcg, 10,000 unit,, 250 mcg (10,000 unit) capsule Take 0.5 capsules (125 mcg total) by mouth daily. 120 capsule 2    coenzyme Q10 200 mg capsule Take 1 capsule (200 mg total) by mouth.      cyanocobalamin, vitamin B-12, 1000 MCG tablet Take 0.5 tablets (500 mcg total) by mouth daily. 30 tablet 0    ENTRESTO 24-26 mg tablet Take 1 tablet by mouth.      ferrous sulfate  (FEROSUL) 325 (65 FE) MG tablet Take 1 tablet (325 mg total) by mouth every other day. 45 tablet 3    finasteride (PROSCAR) 5 mg tablet Take 1 tablet (5 mg total) by mouth daily. TAKE 1 TABLET (5 MG TOTAL) BY MOUTH DAILY.      fish,bora,flax oils-om3,6,9no1 (OMEGA 3-6-9) 1,200 mg cap Take 1 tablet by mouth in the morning. 30 capsule 0    L.acidophil-L.plantar-Bifido 7 15 billion cell cap Take 5 mg by mouth in the morning. 30 capsule 0    lysine 1,000 mg Tab Take 1,000 mg by mouth.  magnesium gluconate (MAGONATE) 500 mg (27 mg elem magnesium) tablet Take 1 tablet by mouth daily. 30 tablet 0    meloxicam (MOBIC) 15 MG tablet Take 1 tablet (15 mg total) by mouth daily.      metoPROLOL succinate (TOPROL-XL) 25 MG 24 hr tablet Take 0.5 tablets (12.5 mg total) by mouth daily.      mv-min-FA-vit K-lutein-zeaxant (PRESERVISION AREDS 2 PLUS MV) 200 mcg-15 mcg- 5 mg-1 mg cap Take 1 tablet by mouth in the morning. 30 capsule 0    ondansetron  (ZOFRAN ) 4 MG tablet Take 1 tablet (4 mg) by mouth once a day as needed for nausea. 30 tablet 0    pramipexole (MIRAPEX) 0.5 MG tablet Take 1 tablet (0.5 mg total) by mouth nightly.      selenium 200 mcg Tab Take 1 tablet (200 mcg total) by mouth.      tamsulosin (FLOMAX) 0.4 mg capsule       timolol (BETIMOL) 0.5 % ophthalmic solution 1 drop two (2) times a day.      traMADol (ULTRAM) 50 mg tablet TAKE 1 TO 2 TABLETS BY MOUTH THREE TIMES DAILY AS NEEDED FOR PAIN      valACYclovir  (VALTREX ) 500 MG tablet Take 1 tablet (500 mg total) by mouth daily. 30 tablet 5    zinc gluconate 50 mg (7 mg elemental zinc) tablet Take 1 tablet (50 mg total) by mouth daily.       No current facility-administered medications for this visit.       Physical Exam:     PHYSICAL EXAMINATION:  ECOG: 1  VITAL SIGNS:  BP 99/57  - Pulse 95  - Temp 36.6 ??C (97.8 ??F) (Temporal)  - Resp 18  - Wt 79.2 kg (174 lb 9.7 oz)  - SpO2 96%  - BMI 28.18 kg/m??     General: well appearing, in no acute distress  HEENT: Sclera anicteric. Conjunctivae not injected, no neck pain or rigidity noted.  Resp: Normal WOB, symmetric excursion, CTAB  CV: RRR without murmurs, rubs or gallops.    GI: Normal bowel sounds, abdomen soft, non-tender, non-distended  Skin: Multiple pink papules on forehead and posterior neck consistent with insect bites.  Neuro: Alert and oriented to conversation, moves all extremities without asymmetry and with at least antigravity strength  Psych: Normal affect  Extremities: No clubbing or lower extremity edema appreciated.  LYMPH: No palpable cervical, supraclavicular, axillary, inguinal lymphadenopathy. Palpable cervical glands, no distinct LN.      Labs:   See oncology history      Pathology:   See oncology history    Imaging:   I personally reviewed all of the patient's pertinent images and documented any relevant findings in the assessment/plan.  See outside records.    See HPI

## 2024-02-24 ENCOUNTER — Ambulatory Visit: Admit: 2024-02-24 | Discharge: 2024-02-25 | Payer: Medicare (Managed Care) | Attending: Hematology | Primary: Hematology

## 2024-02-24 ENCOUNTER — Other Ambulatory Visit: Admit: 2024-02-24 | Discharge: 2024-02-25 | Payer: Medicare (Managed Care)

## 2024-02-24 DIAGNOSIS — R21 Rash and other nonspecific skin eruption: Principal | ICD-10-CM

## 2024-02-24 DIAGNOSIS — C9110 Chronic lymphocytic leukemia of B-cell type not having achieved remission: Principal | ICD-10-CM

## 2024-02-24 DIAGNOSIS — C8510 Unspecified B-cell lymphoma, unspecified site: Principal | ICD-10-CM

## 2024-02-24 LAB — CBC W/ AUTO DIFF
BASOPHILS ABSOLUTE COUNT: 0 10*9/L (ref 0.0–0.1)
BASOPHILS RELATIVE PERCENT: 0.4 %
EOSINOPHILS ABSOLUTE COUNT: 0.2 10*9/L (ref 0.0–0.5)
EOSINOPHILS RELATIVE PERCENT: 2.6 %
HEMATOCRIT: 39.8 % (ref 39.0–48.0)
HEMOGLOBIN: 13.6 g/dL (ref 12.9–16.5)
LYMPHOCYTES ABSOLUTE COUNT: 1.6 10*9/L (ref 1.1–3.6)
LYMPHOCYTES RELATIVE PERCENT: 20.9 %
MEAN CORPUSCULAR HEMOGLOBIN CONC: 34 g/dL (ref 32.0–36.0)
MEAN CORPUSCULAR HEMOGLOBIN: 31.6 pg (ref 25.9–32.4)
MEAN CORPUSCULAR VOLUME: 92.9 fL (ref 77.6–95.7)
MEAN PLATELET VOLUME: 8.8 fL (ref 6.8–10.7)
MONOCYTES ABSOLUTE COUNT: 0.7 10*9/L (ref 0.3–0.8)
MONOCYTES RELATIVE PERCENT: 9.6 %
NEUTROPHILS ABSOLUTE COUNT: 5 10*9/L (ref 1.8–7.8)
NEUTROPHILS RELATIVE PERCENT: 66.5 %
PLATELET COUNT: 187 10*9/L (ref 150–450)
RED BLOOD CELL COUNT: 4.29 10*12/L (ref 4.26–5.60)
RED CELL DISTRIBUTION WIDTH: 13 % (ref 12.2–15.2)
WBC ADJUSTED: 7.5 10*9/L (ref 3.6–11.2)

## 2024-02-24 LAB — COMPREHENSIVE METABOLIC PANEL
ALBUMIN: 3.8 g/dL (ref 3.4–5.0)
ALKALINE PHOSPHATASE: 60 U/L (ref 46–116)
ALT (SGPT): 20 U/L (ref 10–49)
ANION GAP: 11 mmol/L (ref 5–14)
AST (SGOT): 27 U/L (ref <=34)
BILIRUBIN TOTAL: 0.5 mg/dL (ref 0.3–1.2)
BLOOD UREA NITROGEN: 21 mg/dL (ref 9–23)
BUN / CREAT RATIO: 19
CALCIUM: 10.2 mg/dL (ref 8.7–10.4)
CHLORIDE: 105 mmol/L (ref 98–107)
CO2: 25 mmol/L (ref 20.0–31.0)
CREATININE: 1.09 mg/dL (ref 0.73–1.18)
EGFR CKD-EPI (2021) MALE: 67 mL/min/1.73m2 (ref >=60)
GLUCOSE RANDOM: 102 mg/dL (ref 70–179)
POTASSIUM: 5 mmol/L — ABNORMAL HIGH (ref 3.4–4.8)
PROTEIN TOTAL: 6.4 g/dL (ref 5.7–8.2)
SODIUM: 141 mmol/L (ref 135–145)

## 2024-02-24 NOTE — Unmapped (Signed)
 02/24/2024 - Patient originally requested delivery for 8/28. Delivery is not possible on this date due to unable to leave package in mailbox. I have reached out to the patient and confirmed that delivery on 9/3 to temporary address (his home) is ok.

## 2024-02-25 ENCOUNTER — Encounter: Payer: Self-pay | Admitting: Urology

## 2024-02-25 DIAGNOSIS — C9110 Chronic lymphocytic leukemia of B-cell type not having achieved remission: Principal | ICD-10-CM

## 2024-02-26 DIAGNOSIS — C9110 Chronic lymphocytic leukemia of B-cell type not having achieved remission: Principal | ICD-10-CM

## 2024-02-26 NOTE — Unmapped (Signed)
 I spoke with patient Brendan Bishop to confirm 03DEC2025 appointments for the following:    CT @ 1200  LABS @ 1300  VISIT @1400     [name]

## 2024-03-02 MED FILL — VALACYCLOVIR 500 MG TABLET: ORAL | 90 days supply | Qty: 90 | Fill #2

## 2024-03-14 DIAGNOSIS — C9110 Chronic lymphocytic leukemia of B-cell type not having achieved remission: Principal | ICD-10-CM

## 2024-03-14 NOTE — Unmapped (Signed)
 The [facility] has made a second and final attempt to reach this patient to refill the following medication:Calquence  100mg .      We have left voicemails on the following phone numbers: [phone], have been unable to leave messages on the following phone numbers: [phone], have sent a MyChart message, have sent a text message to the following phone numbers: [phone], and have sent a Mychart questionnaire..    Dates contacted: 9/16,22  Last scheduled delivery: 02/17/24    The patient may be at risk of non-compliance with this medication. The patient should call the [facility] at [phone]  Option 4, then Option 1: Oncology to refill medication.    Maryla CHRISTELLA [facility] Specialty Technician

## 2024-03-28 MED FILL — CALQUENCE (ACALABRUTINIB MALEATE) 100 MG TABLET: ORAL | 30 days supply | Qty: 60 | Fill #0

## 2024-03-28 NOTE — Unmapped (Signed)
 [facility] Specialty and Home Delivery Pharmacy Refill Coordination Note    Brendan Bishop, DOB: 1941/05/09  Phone: [phone] (home) [phone] (work)      All above HIPAA information was verified with patient.         03/25/2024     8:24 PM   Specialty Rx Medication Refill Questionnaire   Which Medications would you like refilled and shipped? Calquence    Please list all current allergies: None   Have you missed any doses in the last 30 days? No   Have you had any changes to your medication(s) since your last refill? No   How much of each medication do you have remaining at home? (eg. number of tablets, injections, etc.) I will run out on Monday   Have you experienced any side effects in the last 30 days? No   Please enter the full address (street address, city, state, zip code) where you would like your medication(s) to be delivered to. [address] [place], [place] [address]   Please specify on which day you would like your medication(s) to arrive. Note: if you need your medication(s) within 3 days, please call the pharmacy to schedule your order at [phone]  03/28/2024   Has your insurance changed since your last refill? No   Would you like a pharmacist to call you to discuss your medication(s)? No   Do you require a signature for your package? (Note: if we are billing Medicare Part B or your order contains a controlled substance, we will require a signature) No   I have been provided my out of pocket cost for my medication and approve the pharmacy to charge the amount to my credit card on file. Yes   Additional Comments: The cost of this medication is covered by a grant and does not result in any out0of-pocket cost., Additionally, I made a mistake by requesting a refill from the wrong agency [facility]) and that request should be dismissed.         Completed refill call assessment today to schedule patient's medication shipment from the [facility] and Home Delivery Pharmacy [phone]).  All relevant notes have been reviewed.       Confirmed patient received a Conservation officer, historic buildings and a Surveyor, mining with first shipment. The patient will receive a drug information handout for each medication shipped and additional FDA Medication Guides as required.         REFERRAL TO PHARMACIST     Referral to the pharmacist: Not needed      [facility]     Shipping address confirmed in Epic.     Delivery Scheduled: Yes, Expected medication delivery date: 03/28/24.     Medication will be delivered via Same Day Courier to the prescription address in Epic [place].    Phoebe Marter M Santer Torres   [place] [facility] Pharm[facility] Specialty Technician

## 2024-04-25 DIAGNOSIS — C9110 Chronic lymphocytic leukemia of B-cell type not having achieved remission: Principal | ICD-10-CM

## 2024-04-25 NOTE — Progress Notes (Signed)
 [facility] Specialty and Home Delivery Pharmacy Refill Coordination Note    Brendan ENCINAS, DOB: 04-21-1941  Phone: [phone] (home) [phone] (work)      All above HIPAA information was verified with patient.         04/22/2024    11:37 PM   Specialty Rx Medication Refill Questionnaire   Which Medications would you like refilled and shipped? Calquence    Please list all current allergies: None   Have you missed any doses in the last 30 days? No   Have you had any changes to your medication(s) since your last refill? No   How much of each medication do you have remaining at home? (eg. number of tablets, injections, etc.) One week   Have you experienced any side effects in the last 30 days? No   Please enter the full address (street address, city, state, zip code) where you would like your medication(s) to be delivered to. [address] [place], [place] [address]   Please specify on which day you would like your medication(s) to arrive. Note: if you need your medication(s) within 3 days, please call the pharmacy to schedule your order at [phone]  04/28/2024   Has your insurance changed since your last refill? No   Would you like a pharmacist to call you to discuss your medication(s)? No   Do you require a signature for your package? (Note: if we are billing Medicare Part B or your order contains a controlled substance, we will require a signature) No   I have been provided my out of pocket cost for my medication and approve the pharmacy to charge the amount to my credit card on file. Yes         Completed refill call assessment today to schedule patient's medication shipment from the [facility] and Home Delivery Pharmacy [phone]).  All relevant notes have been reviewed.       Confirmed patient received a Conservation Officer, Historic Buildings and a Surveyor, Mining with first shipment. The patient will receive a drug information handout for each medication shipped and additional FDA Medication Guides as required.         REFERRAL TO PHARMACIST     Referral to the pharmacist: Not needed      Brendan Bishop     Shipping address confirmed in Epic.     Delivery Scheduled: Yes, Expected medication delivery date: 04/28/24.     Medication will be delivered via Next Day Courier to the temporary address in Epic [place].    Brendan Bishop   [place] [facility] Pharm[facility] Specialty Technician

## 2024-04-27 MED FILL — CALQUENCE (ACALABRUTINIB MALEATE) 100 MG TABLET: ORAL | 30 days supply | Qty: 60 | Fill #1

## 2024-05-15 DIAGNOSIS — C9110 Chronic lymphocytic leukemia of B-cell type not having achieved remission: Principal | ICD-10-CM

## 2024-05-24 DIAGNOSIS — C9110 Chronic lymphocytic leukemia of B-cell type not having achieved remission: Principal | ICD-10-CM

## 2024-05-24 NOTE — Progress Notes (Signed)
 [facility] Specialty and Home Delivery Pharmacy Refill Coordination Note    Specialty Medication(s) to be Shipped:   Hematology/Oncology: Calquence     Other medication(s) to be shipped: Valacyclovir  and Iron     Specialty Medications not needed at this time: N/A     Brendan Bishop, DOB: [birth year]  Phone: [phone] (home) [phone] (work)      All above HIPAA information was verified with patient.     Was a nurse, learning disability used for this call? No    Completed refill call assessment today to schedule patient's medication shipment from the [facility] and Home Delivery Pharmacy  [phone]).  All relevant notes have been reviewed.     Specialty medication(s) and dose(s) confirmed: Regimen is correct and unchanged.   Changes to medications: Brendan Bishop reports no changes at this time.  Changes to insurance: No  New side effects reported not previously addressed with a pharmacist or physician: None reported  Questions for the pharmacist: No    Confirmed patient received a Conservation Officer, Historic Buildings and a Surveyor, Mining with first shipment. The patient will receive a drug information handout for each medication shipped and additional FDA Medication Guides as required.       DISEASE/MEDICATION-SPECIFIC INFORMATION        N/A    SPECIALTY MEDICATION ADHERENCE     Medication Adherence    Patient reported X missed doses in the last month: 0  Specialty Medication: Calquence  100mg   Patient is on additional specialty medications: No  Informant: patient     Were doses missed due to medication being on hold? No    Calquence  100 mg: 7 days of medicine on hand       REFERRAL TO PHARMACIST     Referral to the pharmacist: Not needed      [facility]     Shipping address confirmed in Epic.     Cost and Payment: Patient has a $0 copay, payment information is not required.    Delivery Scheduled: Yes, Expected medication delivery date: 05/27/24.     Medication will be delivered via Next Day Courier to the prescription address in Epic [place].    Salvador Bigbee M Santer Torres   [place] [facility] Delivery Pharm[facility]  Specialty Technician

## 2024-05-25 ENCOUNTER — Other Ambulatory Visit: Admit: 2024-05-25 | Discharge: 2024-05-25 | Payer: Medicare (Managed Care)

## 2024-05-25 ENCOUNTER — Inpatient Hospital Stay: Admit: 2024-05-25 | Discharge: 2024-05-25 | Payer: Medicare (Managed Care)

## 2024-05-25 ENCOUNTER — Ambulatory Visit: Admit: 2024-05-25 | Discharge: 2024-05-25 | Payer: Medicare (Managed Care) | Attending: Family | Primary: Family

## 2024-05-25 DIAGNOSIS — C9110 Chronic lymphocytic leukemia of B-cell type not having achieved remission: Principal | ICD-10-CM

## 2024-05-25 LAB — COMPREHENSIVE METABOLIC PANEL
ALBUMIN: 3.6 g/dL (ref 3.4–5.0)
ALKALINE PHOSPHATASE: 59 U/L (ref 46–116)
ALT (SGPT): 17 U/L (ref 10–49)
ANION GAP: 13 mmol/L (ref 5–14)
AST (SGOT): 24 U/L (ref <=34)
BILIRUBIN TOTAL: 0.5 mg/dL (ref 0.3–1.2)
BLOOD UREA NITROGEN: 20 mg/dL (ref 9–23)
BUN / CREAT RATIO: 18
CALCIUM: 9.8 mg/dL (ref 8.7–10.4)
CHLORIDE: 105 mmol/L (ref 98–107)
CO2: 23 mmol/L (ref 20.0–31.0)
CREATININE: 1.11 mg/dL (ref 0.73–1.18)
EGFR CKD-EPI (2021) MALE: 65 mL/min/1.73m2 (ref >=60)
GLUCOSE RANDOM: 84 mg/dL (ref 70–179)
POTASSIUM: 4.8 mmol/L (ref 3.4–4.8)
PROTEIN TOTAL: 5.9 g/dL (ref 5.7–8.2)
SODIUM: 141 mmol/L (ref 135–145)

## 2024-05-25 LAB — CBC W/ AUTO DIFF
BASOPHILS ABSOLUTE COUNT: 0 10*9/L (ref 0.0–0.1)
BASOPHILS RELATIVE PERCENT: 0.6 %
EOSINOPHILS ABSOLUTE COUNT: 0.1 10*9/L (ref 0.0–0.5)
EOSINOPHILS RELATIVE PERCENT: 1.8 %
HEMATOCRIT: 39.3 % (ref 39.0–48.0)
HEMOGLOBIN: 13 g/dL (ref 12.9–16.5)
LYMPHOCYTES ABSOLUTE COUNT: 1.5 10*9/L (ref 1.1–3.6)
LYMPHOCYTES RELATIVE PERCENT: 20.2 %
MEAN CORPUSCULAR HEMOGLOBIN CONC: 33.2 g/dL (ref 32.0–36.0)
MEAN CORPUSCULAR HEMOGLOBIN: 30.9 pg (ref 25.9–32.4)
MEAN CORPUSCULAR VOLUME: 93.2 fL (ref 77.6–95.7)
MEAN PLATELET VOLUME: 9.6 fL (ref 6.8–10.7)
MONOCYTES ABSOLUTE COUNT: 0.7 10*9/L (ref 0.3–0.8)
MONOCYTES RELATIVE PERCENT: 10 %
NEUTROPHILS ABSOLUTE COUNT: 5 10*9/L (ref 1.8–7.8)
NEUTROPHILS RELATIVE PERCENT: 67.4 %
PLATELET COUNT: 169 10*9/L (ref 150–450)
RED BLOOD CELL COUNT: 4.22 10*12/L — ABNORMAL LOW (ref 4.26–5.60)
RED CELL DISTRIBUTION WIDTH: 12.7 % (ref 12.2–15.2)
WBC ADJUSTED: 7.4 10*9/L (ref 3.6–11.2)

## 2024-05-25 MED ADMIN — iohexol (OMNIPAQUE) 350 mg iodine/mL solution 100 mL: 100 mL | INTRAVENOUS | @ 18:00:00 | Stop: 2024-05-25

## 2024-05-25 NOTE — Progress Notes (Signed)
 [facility]   Primary Hematologist: Barnie Drones, DO   Specialty: CLL  Visit Type: Follow up - On treatment     Patient: Brendan Bishop  MRN: [id number]  DOB: Feb 05, 1941  DOS: 05/25/2024    Referring Physician: Drones Barnie Jansky, DO    Chronic Lymphocytic Leukemia  Date of Diagnosis 05/21/2021   Rai Stage at Dx 1   Beta-2 microglobulin at Dx Not done   IGHV Mutational Status Un-mutated   FISH Normal   Cytogenetics 46,XY,del(7)(q31q32)[14]/46,XY[6]    Mutations TP53 mutation c.375G>A    CLL-IPI Score at Dx Unable to calculate, but at least 4 for age/stage/IGHV     Assessment/Plan:   83 y.o. male with a history of multiple cancers, who presents for follow-up of biphenotypic LPD.    #Biphenotypic B-cell lymphoproliferative disorder  -He has 2 distinct abnormal monoclonal B-cell populations.  One is classic for CLL with CD5+ and dim CD20 and kappa restricted.  The other is more consistent with marginal zone lymphoma which is CD5-/CD10-/CD20+ and lambda restricted.  As all pathology has been equal distribution of both cell types, it is unclear which or if both are contributing equally to the progressive lymphocytosis.  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  -Due to rapidly progressive lymphocytosis and progressive lymphadenopathy and anemia, he meets criteria for treatment.  -Interestingly, his peripheral blood flow from 07/23/22 showed 60% of the CD5- population and 10% of the CD5+ population, while the bone marrow biopsy from 08/06/22 demonstrated 90% of the CD5+ (although both populations were detected by [facility] sequencing)  -Previously discussed potential treatment options, but the best option that would be effective against both clones is the combination of obinutuzumab  and acalabrutinib  as given for frontline CLL therapy in the [facility] [place] study. Obinutuzumab  was given for 6 months and acalabrutinib  is given continuously.  -He initiated therapy with obinutuzumab  and acalabrutinib  09/03/22, he has completed 6 months of obi and is now on continuous acalabruitinib.  - 02/24/23 CT c/a/p shows complete resolution of all lymphadenopathy consistent with a CR by CT criteria.  Since the plan will be for continuous acalabrutinib , will not complete a bone marrow biopsy to confirm CR.  - Continued evidence of clinical response.  - CT scans today with NED  - Continue acalabrutinib  100mg  BID  - Return in 3 months    #Skin reaction due to insect bites and topical agents  Prominent rash and swelling on face, likely due to bug spray, persisting for a month. Hydrocortisone cream recommended with caution regarding potential skin thinning. Advised to use a more natural bug spray. Apple cider vinegar may exacerbate redness.  - Use 1% hydrocortisone cream on bug bites  - Try a more natural bug spray with less chemicals  - Has a dermatologist Dr. Arlyss that is following him close to home.    #Falls and imbalance:  - States this was happening prior to starting acalabrutinib .  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  Now he has finished his 14 sessions and they did not recommend returning.    #. Knee and back pain:  - Chronic present before acalabrutinib .  - He will go to PT as above.    #. Pre-Parkinsons  - He follows with neurology.                                               #  Dysphagia to solids   -Previously needed an esophageal dilation.  -EGD canceled by local GI given high risk deemed by Cardiologist    #Multiple medical comorbidities.  -Managed by PCP and other specialists.      #. Preventive  Cancer screening:  Patients with CLL/SLL are at increased risk of developing secondary malignancies.    Annual dermatology evaluation, due to increased risk of non-melanomatous skin cancers.   If needed, blood products for transfusion should be irradiated.  Risk of Hypogammaglobulinemia: Baseline IgG levels 03/11/19 = 698, 07/23/22 1095  Vaccination  Avoid all live vaccines.  Recommend annual influenza vaccine.  Last given today, 09/02/23  Recommend COVID vaccine series and booster.  Last given:  04/13/20  Recommend pneumococcal vaccine with Prevnar 20.  Will get with PCP  Recommend RSV vaccine. Last given:  07/18/22  Recommend Zoster vaccine recombinant, adjuvanted (Shingrix).  Last given: 01/18/13 (chart lists as zostavax, will need to confirm)     -RTC in 3 months  -Continue acalabrutinib  100mg  BID.        In total I spent 43 minutes in this visit which consisted of collecting a history and making medical management decisions for the diagnoses listed in my note, as well as reviewing and updating the patient's medical record, coordinating care, and documenting before, during and after the visit.     Lowella Harvard, MSN, FNP-BC, [facility]  Nurse Practitioner  [facility] Hematology/Oncology       Chief Complaint:     Chief Complaint   Patient presents with    Routine Follow-up       Oncology/Treatment History:     Hematology/Oncology History   CLL (chronic lymphocytic leukemia) (CMS-HCC)   05/21/2021 Initial Diagnosis    CLL (chronic lymphocytic leukemia) (CMS-HCC)  -While he was officially diagnosed in 2022, he had a mild lymphocytosis dating back to 2017, when he was undergoing radiation for his prostate cancer.  On 02/08/2016, he had a flow cytometry, which showed a small population of monoclonal B-cells that were CD5-/CD10- with a total of 2.9k/uL clonal cells.  Observation was recommended.   -further evaluation of asymptomatic lymphocytosis ensued 05/21/2021.  -WBC 20.1, Hgb 12.5, Plt 238, ALC 13.8, ANC 3.96, B2M Not done, LDH 116 (ULN 192)  -Flow showed monoclonal B-cell population consistent with CLL with absolute clonal count of 3.74 k/UL, however a separate population of CD5-/CD10- monoclonal B-cells was detected  -Rai Stage: 1  -IGHV: Unmutated  -FISH showed: Normal  -Karyotype showed: Not completed.  -Recommendation: observation 07/01/2021 Interval Scan(s)    PET scan with scattered LAD, max size 2.0cm, No hepatosplenomegaly.     07/05/2021 Biopsy    Bone marrow biopsy showed hypercellular bone marrow with involvement of a low-grade B-cell lymphoproliferative disorder.  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 Ki-67 stain is variable in the interfollicular areas, 10% to 30%.   There are two separate B cell clones, in roughly equal proportions:   1) Kappa light chain restricted (dim expression), CD20+ (dim), CD5+, CD23+, CD43+, FMC-   2) Lambda light chain restricted, CD20+, CD5-, CD23+, CD43-, FMC+(dim).      06/26/2022 Interval Scan(s)    PET scan with continued LAD with mild progression.  Largest node 2.4cm in mesentery.     07/23/2022 Molecular Markers    -FISH showed: Normal  -Karyotype showed: 46,XY,del(7)(q31q32)[14]/46,XY[6]   -TP53 mutation was detected c.375G>A  08/05/2022 Biopsy    Bone marrow biopsy:  -  Hypercellular bone marrow (90%) primarily involved by CD5+ B-cell lymphoma/leukemia, consistent with chronic lymphocytic leukemia, representing approximately 70% of marrow cellularity by immunohistochemistry (see Comment)  -  Clonal immunoglobulin heavy (IGH) and kappa light chain (IGK) gene rearrangements identified (see Comment)      09/03/2022 -  Targeted Therapy    Obinutuzumab  + acalabrutinib      02/24/2023 Interval Scan(s)    Complete resolution of lymph nodes.           History Obtained From:   Patient and his significant other.    History of Present Illness:   Brendan Bishop is a 83 y.o. male with a history of multiple cancers, who presents with progressive lymphocytosis due to his lymphoproliferative disorder and consultation for treatment recommendations.  For detailed oncologic and treatment (if applicable) history, please see above.     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    He continues daily acalabrutinib  and Valtrex , and has tolerated therapy well. He completed obinutuzumab  induction in September 2024. He is awaiting CT scan results. He has not experienced fevers, chills, night sweats, or abnormal bleeding. He ran out of an herbal supplement he has been taking for three weeks recently, during which he noted significant worsening of fatigue and overall condition, with improvement after resuming therapy.    He describes persistent and worsening fatigue, present throughout the day and most pronounced in the mornings. He attributes some fatigue to poor sleep but notes persistence even with adequate rest. 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 Appetite is good with some weight gain, and digestion is normal. No fevers, chills, or night sweats.    Gait instability and recurrent falls have increased, with frequent episodes of swaying and loss of balance, sometimes while standing. He fell two weeks ago, striking his head and nose. His caregiver confirms multiple falls and significant gait impairment. He ambulates with a stooped posture, which he associates with bowel issues and chronic low back pain. Symptoms are most severe in the morning and improve somewhat by afternoon, but gait remains impaired. He denies numbness or tingling in the extremities. He has pre-parkinsonism and significant arthritis contributing to mobility issues.    Chronic low back pain is present and worsening, particularly after physical exertion. He takes tramadol for pain, sometimes two tablets at a time but less than four per day. No new neurological deficits are reported. He experiences mild exertional dyspnea but denies chest pain or acute respiratory symptoms.    He reports intermittent constipation, managed with magnesium citrate and dietary interventions, with variable success. He denies gastrointestinal bleeding, hematuria, or abdominal pain. He continues iron  supplementation every other day and probiotics regularly.    Left facial droop and swelling have been noted, with a history of parotid gland surgery in 2017 for cancer. Recent mild swelling in the same area prompted ENT follow-up, with no concerning findings. 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 No new neurological symptoms are reported.    He expresses concern regarding insurance coverage for medications and the cost of care, particularly long-term acalabrutinib  use. He remains  engaged in work and daily activities despite ongoing symptoms and is scheduled for continued surveillance with laboratory studies and imaging.       Review of Systems   Complete ROS performed and was negative except what is noted in interim history above.    Past Medical History     Past Medical History:   Diagnosis Date    Bladder cancer    (CMS-HCC) 09/13/2021    CAD (coronary artery disease)     CLL (chronic lymphocytic leukemia)    (CMS-HCC)     GERD (gastroesophageal reflux disease)     Hyperlipidemia     Hypertension     Marginal zone B-cell lymphoma    (CMS-HCC)     Multinodular goiter     OSA (obstructive sleep apnea)     Prostate cancer    (CMS-HCC) 10/2015    Stroke    (CMS-HCC) 2011       Past Surgical History:     Past Surgical History:   Procedure Laterality Date    BIOPSY OF THYROID [facility] HISTORICAL RESULT)      BONE MARROW BIOPSY      CARPAL TUNNEL RELEASE      CATARACT EXTRACTION Bilateral     CORONARY ARTERY BYPASS GRAFT  2005    HEMORRHOID SURGERY  2002    LYMPH NODE BIOPSY  07/08/2021    PAROTIDECTOMY Left 08/04/2017    TONSILLECTOMY AND ADENOIDECTOMY      TOTAL KNEE ARTHROPLASTY Left 2014    TOTAL KNEE ARTHROPLASTY Right 05/17/2018    TRANSURETHRAL RESECTION OF BLADDER TUMOR  09/13/2021       Allergies:     No Known Allergies      Family History:     Family History   Problem Relation Age of Onset    Heart disease Mother     Parkinsonism Father     Alzheimer's disease Father     Diabetes Paternal Grandfather        Social History:     Social History     Tobacco Use    Smoking status: Never    Smokeless tobacco: Never   Vaping Use    Vaping status: Never Used   Substance Use Topics    Alcohol use: Not Currently    Drug use: Never       Medications:     Current Outpatient Medications   Medication Sig Dispense Refill    acalabrutinib  (CALQUENCE ) 100 mg tablet Take 1 tablet (100 mg total) by mouth two (2) times a day. Swallow whole with water. Do not to chew, crush, dissolve, or cut tablets. 60 tablet 11    acetaminophen  (TYLENOL ) 325 MG tablet Take 1.5 tablets (487.5 mg total) by mouth nightly. 100 tablet 2    allopurinol  (ZYLOPRIM ) 300 MG tablet Take 1 tablet (300 mg total) by mouth daily. 30 tablet 1    ascorbic acid, vitamin C, (VITAMIN C) 1000 MG tablet Take 1 tablet (1,000 mg total) by mouth.      aspirin (ECOTRIN) 81 MG tablet Take 1 tablet (81 mg total) by mouth.      atorvastatin (LIPITOR) 80 MG tablet       b complex vitamins (SUPER B-50 COMPLEX) capsule Take 1 capsule by mouth daily. 30 capsule 11    cholecalciferol, vitamin D3-250 mcg, 10,000 unit,, 250 mcg (10,000 unit) capsule Take 0.5 capsules (125 mcg total) by mouth daily. 120 capsule 2    coenzyme Q10 200 mg capsule Take 1 capsule (  200 mg total) by mouth.      cyanocobalamin, vitamin B-12, 1000 MCG tablet Take 0.5 tablets (500 mcg total) by mouth daily. 30 tablet 0    ENTRESTO 24-26 mg tablet Take 1 tablet by mouth.      ferrous sulfate  (FEROSUL) 325 (65 FE) MG tablet Take 1 tablet (325 mg total) by mouth every other day. 45 tablet 3    finasteride (PROSCAR) 5 mg tablet Take 1 tablet (5 mg total) by mouth daily. TAKE 1 TABLET (5 MG TOTAL) BY MOUTH DAILY.      fish,bora,flax oils-om3,6,9no1 (OMEGA 3-6-9) 1,200 mg cap Take 1 tablet by mouth in the morning. 30 capsule 0    L.acidophil-L.plantar-Bifido 7 15 billion cell cap Take 5 mg by mouth in the morning. 30 capsule 0    lysine 1,000 mg Tab Take 1,000 mg by mouth.      magnesium gluconate (MAGONATE) 500 mg (27 mg elem magnesium) tablet Take 1 tablet by mouth daily. 30 tablet 0    meloxicam (MOBIC) 15 MG tablet Take 1 tablet (15 mg total) by mouth daily.      metoPROLOL succinate (TOPROL-XL) 25 MG 24 hr tablet Take 0.5 tablets (12.5 mg total) by mouth daily.      mv-min-FA-vit K-lutein-zeaxant (PRESERVISION AREDS 2 PLUS MV) 200 mcg-15 mcg- 5 mg-1 mg cap Take 1 tablet by mouth in the morning. 30 capsule 0    ondansetron  (ZOFRAN ) 4 MG tablet Take 1 tablet (4 mg) by mouth once a day as needed for nausea. 30 tablet 0    pramipexole (MIRAPEX) 0.5 MG tablet Take 1 tablet (0.5 mg total) by mouth nightly.      selenium 200 mcg Tab Take 1 tablet (200 mcg total) by mouth.      tamsulosin (FLOMAX) 0.4 mg capsule       timolol (BETIMOL) 0.5 % ophthalmic solution 1 drop two (2) times a day.      traMADol (ULTRAM) 50 mg tablet TAKE 1 TO 2 TABLETS BY MOUTH THREE TIMES DAILY AS NEEDED FOR PAIN      valACYclovir  (VALTREX ) 500 MG tablet Take 1 tablet (500 mg total) by mouth daily. 30 tablet 5    zinc gluconate 50 mg (7 mg elemental zinc) tablet Take 1 tablet (50 mg total) by mouth daily.       No current facility-administered medications for this visit.       Physical Exam:     PHYSICAL EXAMINATION:  ECOG: 1  VITAL SIGNS:  BP 128/67  - Pulse 92  - Temp 36.3 ??C (97.3 ??F) (Temporal)  - Resp 18  - Wt 81.1 kg (178 lb 12.7 oz)  - SpO2 95%  - BMI 28.86 kg/m??     General: well appearing, in no acute distress  HEENT: Sclera anicteric. Conjunctivae not injected, no neck pain or rigidity noted.  Resp: Normal WOB, symmetric excursion, CTAB  CV: RRR without murmurs, rubs or gallops.    GI: Normal bowel sounds, abdomen soft, non-tender, non-distended  Skin: no rashes, or lesions  Neuro: Alert and oriented to conversation, moves all extremities without asymmetry and with at least antigravity strength  Psych: Normal affect  Extremities: No clubbing or lower extremity edema appreciated.  LYMPH: No palpable cervical, supraclavicular, axillary, inguinal lymphadenopathy. Palpable cervical glands, no distinct LN.      Labs:   See oncology history      Pathology:   See oncology history    Imaging:   I personally reviewed all  of the patient's pertinent images and documented any relevant findings in the assessment/plan.  See outside records.    See HPI

## 2024-05-26 DIAGNOSIS — C9110 Chronic lymphocytic leukemia of B-cell type not having achieved remission: Principal | ICD-10-CM

## 2024-05-26 MED FILL — CALQUENCE (ACALABRUTINIB MALEATE) 100 MG TABLET: ORAL | 30 days supply | Qty: 60 | Fill #2

## 2024-05-26 MED FILL — FEROSUL 325 MG (65 MG IRON) TABLET: ORAL | 90 days supply | Qty: 45 | Fill #1

## 2024-05-26 MED FILL — VALACYCLOVIR 500 MG TABLET: ORAL | 30 days supply | Qty: 30 | Fill #3

## 2024-06-05 DIAGNOSIS — C9110 Chronic lymphocytic leukemia of B-cell type not having achieved remission: Principal | ICD-10-CM

## 2024-06-07 DIAGNOSIS — C9110 Chronic lymphocytic leukemia of B-cell type not having achieved remission: Principal | ICD-10-CM

## 2024-06-07 NOTE — Telephone Encounter (Addendum)
 Patient Brendan Bishop was called in attempt to rescheule 04MAR2025 appointment due to the provider not being in the office.    The call resulted in: Voicemail full    Please reschedule the patient upon their return call for the following:     Lab Appointment and Provider Anniece with LABS prior)      MyChart message sent.    Thank you,    Chavella Ingram

## 2024-06-20 DIAGNOSIS — C9110 Chronic lymphocytic leukemia of B-cell type not having achieved remission: Principal | ICD-10-CM

## 2024-06-20 NOTE — Telephone Encounter (Signed)
 Unable to leave message. If patient calls back schedule labs and office visit with Bejal Kikani on 3/4 or any day after that works for patient.

## 2024-06-20 NOTE — Progress Notes (Signed)
Error please disregard encounter

## 2024-06-21 DIAGNOSIS — C9110 Chronic lymphocytic leukemia of B-cell type not having achieved remission: Principal | ICD-10-CM

## 2024-06-21 MED ORDER — VALACYCLOVIR 500 MG TABLET
ORAL_TABLET | Freq: Every day | ORAL | 5 refills | 30.00000 days
Start: 2024-06-21 — End: ?

## 2024-06-21 NOTE — Telephone Encounter (Signed)
 Unable to leave message. If patient calls back schedule labs and office visit with Bejal Kikani on 3/4 or any day after that works for patient.

## 2024-06-21 NOTE — Telephone Encounter (Signed)
 Please refill if appropriate.     Most recent clinic visit: 06/07/2024  Next clinic visit: 06/21/2024

## 2024-06-21 NOTE — Telephone Encounter (Signed)
 My Chart message sent

## 2024-06-21 NOTE — Progress Notes (Signed)
 [facility] Delivery Pharm[facility] Refill Coordination Note    Brendan Bishop, DOB: 1940-09-30  Phone: [phone] (home) [phone] (work)      All above HIPAA information was verified with patient.         06/20/2024     2:58 PM   Specialty Rx Medication Refill Questionnaire   Which Medications would you like refilled and shipped? Calquence  & Valtrex    Please list all current allergies: None   Have you missed any doses in the last 30 days? No   Have you had any changes to your medication(s) since your last refill? No   How much of each medication do you have remaining at home? (eg. number of tablets, injections, etc.) 5 days   Have you experienced any side effects in the last 30 days? No   Please enter the full address (street address, city, state, zip code) where you would like your medication(s) to be delivered to. [address] [place], [place] [address]   Please specify on which day you would like your medication(s) to arrive. Note: if you need your medication(s) within 3 days, please call the pharmacy to schedule your order at [phone]  06/24/2024   Has your insurance changed since your last refill? No   Would you like a pharmacist to call you to discuss your medication(s)? No   Do you require a signature for your package? (Note: if we are billing Medicare Part B or your order contains a controlled substance, we will require a signature) No   I have been provided my out of pocket cost for my medication and approve the pharmacy to charge the amount to my credit card on file. Yes         Completed refill call assessment today to schedule patient's medication shipment from the [facility] [phone]).  All relevant notes have been reviewed.       Confirmed patient received a Conservation Officer, Historic Buildings and a Surveyor, Mining with first shipment. The patient will receive a drug information handout for each medication shipped and additional FDA Medication Guides as required.         REFERRAL TO PHARMACIST     Referral to the pharmacist: Not needed      [facility]     Shipping address confirmed in Epic.     Delivery Scheduled: Yes, Expected medication delivery date: 06/24/24.     Medication will be delivered via Next Day Courier to the temporary address in Epic [place].    Art Therapist

## 2024-06-22 DIAGNOSIS — C9110 Chronic lymphocytic leukemia of B-cell type not having achieved remission: Principal | ICD-10-CM

## 2024-06-22 MED ORDER — VALACYCLOVIR 500 MG TABLET
ORAL_TABLET | Freq: Every day | ORAL | 5 refills | 30.00000 days | Status: CP
Start: 2024-06-22 — End: ?
  Filled 2024-06-22: qty 30, 30d supply, fill #0

## 2024-06-22 MED FILL — CALQUENCE (ACALABRUTINIB MALEATE) 100 MG TABLET: ORAL | 30 days supply | Qty: 60 | Fill #3

## 2024-07-18 DIAGNOSIS — C9110 Chronic lymphocytic leukemia of B-cell type not having achieved remission: Principal | ICD-10-CM

## 2024-07-19 DIAGNOSIS — C9110 Chronic lymphocytic leukemia of B-cell type not having achieved remission: Principal | ICD-10-CM

## 2024-07-21 DIAGNOSIS — C9110 Chronic lymphocytic leukemia of B-cell type not having achieved remission: Principal | ICD-10-CM

## 2024-07-25 DIAGNOSIS — C9110 Chronic lymphocytic leukemia of B-cell type not having achieved remission: Principal | ICD-10-CM

## 2024-07-25 NOTE — Progress Notes (Signed)
 [facility] Delivery Pharm[facility] Refill Coordination Note    Specialty Medication(s) to be Shipped:   Hematology/Oncology: Calquence     Other medication(s) to be shipped: Valacyclovir     Specialty Medications not needed at this time: N/A     Brendan Bishop, DOB: 1941-03-05  Phone: [phone] (home) [phone] (work)      All above HIPAA information was verified with patient.     Was a nurse, learning disability used for this call? No    Completed refill call assessment today to schedule patient's medication shipment from the [facility] and [facility] Delivery Pharmacy  [phone]).  All relevant notes have been reviewed.     Specialty medication(s) and dose(s) confirmed: Regimen is correct and unchanged.   Changes to medications: Brendan Bishop reports no changes at this time.  Changes to insurance: No  New side effects reported not previously addressed with a pharmacist or physician: None reported  Questions for the pharmacist: No    Confirmed patient received a Conservation Officer, Historic Buildings and a Surveyor, Mining with first shipment. The patient will receive a drug information handout for each medication shipped and additional FDA Medication Guides as required.       DISEASE/MEDICATION-SPECIFIC INFORMATION        N/A    SPECIALTY MEDICATION ADHERENCE     Medication Adherence    Patient reported X missed doses in the last month: 0  Specialty Medication: Calquence  100mg   Patient is on additional specialty medications: No  Informant: patient     Were doses missed due to medication being on hold? No    Calquence  100 mg: 3 days of medicine on hand       Specialty medication is an injection or given on a cycle: No    REFERRAL TO PHARMACIST     Referral to the pharmacist: Not needed      [facility]     Shipping address confirmed in Epic.     Cost and Payment: Patient has a copay of $4. They are aware and have authorized the pharmacy to charge the credit card on file.    Delivery Scheduled: Yes, Expected medication delivery date: 07/27/24.     Medication will be delivered via Next Day Courier to the prescription address in Epic [place].    Brendan Bishop   [facility]  Specialty Technician

## 2024-07-26 DIAGNOSIS — C9110 Chronic lymphocytic leukemia of B-cell type not having achieved remission: Principal | ICD-10-CM

## 2024-07-26 MED FILL — VALACYCLOVIR 500 MG TABLET: ORAL | 30 days supply | Qty: 30 | Fill #1

## 2024-07-26 MED FILL — CALQUENCE (ACALABRUTINIB MALEATE) 100 MG TABLET: ORAL | 30 days supply | Qty: 60 | Fill #4

## 2024-11-02 ENCOUNTER — Other Ambulatory Visit: Admitting: Urology

## 2024-11-03 ENCOUNTER — Other Ambulatory Visit: Admitting: Urology
